# Patient Record
Sex: Male | Born: 1935 | Race: Black or African American | Hispanic: No | Marital: Married | Smoking: Former smoker
Health system: Southern US, Community
[De-identification: ages and names within clinical notes are randomized; demographics above are authoritative.]

## PROBLEM LIST (undated history)

## (undated) DIAGNOSIS — E785 Hyperlipidemia, unspecified: Secondary | ICD-10-CM

## (undated) DIAGNOSIS — I739 Peripheral vascular disease, unspecified: Secondary | ICD-10-CM

## (undated) DIAGNOSIS — R140 Abdominal distension (gaseous): Secondary | ICD-10-CM

## (undated) DIAGNOSIS — E1151 Type 2 diabetes mellitus with diabetic peripheral angiopathy without gangrene: Secondary | ICD-10-CM

## (undated) DIAGNOSIS — E119 Type 2 diabetes mellitus without complications: Secondary | ICD-10-CM

## (undated) DIAGNOSIS — I480 Paroxysmal atrial fibrillation: Secondary | ICD-10-CM

## (undated) DIAGNOSIS — K279 Peptic ulcer, site unspecified, unspecified as acute or chronic, without hemorrhage or perforation: Secondary | ICD-10-CM

## (undated) DIAGNOSIS — D649 Anemia, unspecified: Secondary | ICD-10-CM

## (undated) DIAGNOSIS — I10 Essential (primary) hypertension: Secondary | ICD-10-CM

## (undated) DIAGNOSIS — I5032 Chronic diastolic (congestive) heart failure: Secondary | ICD-10-CM

## (undated) HISTORY — PX: PR VEIN BYPASS GRAFT,AORTO-FEM-POP: 35551

## (undated) HISTORY — DX: Chronic diastolic (congestive) heart failure: I50.32

## (undated) HISTORY — DX: Essential (primary) hypertension: I10

## (undated) HISTORY — PX: APPENDECTOMY: SHX54

## (undated) HISTORY — DX: Type 2 diabetes mellitus with diabetic peripheral angiopathy without gangrene: E11.51

## (undated) HISTORY — DX: Anemia, unspecified: D64.9

## (undated) HISTORY — DX: Abdominal distension (gaseous): R14.0

## (undated) HISTORY — DX: Peripheral vascular disease, unspecified: I73.9

## (undated) HISTORY — DX: Hyperlipidemia, unspecified: E78.5

## (undated) HISTORY — PX: EYE SURGERY: SHX253

## (undated) HISTORY — PX: CATARACT EXTRACTION W/ INTRAOCULAR LENS  IMPLANT, BILATERAL: SHX1307

---

## 2008-08-04 ENCOUNTER — Emergency Department (HOSPITAL_COMMUNITY): Admission: EM | Admit: 2008-08-04 | Discharge: 2008-08-04 | Payer: Self-pay | Admitting: Emergency Medicine

## 2008-08-05 ENCOUNTER — Encounter (INDEPENDENT_AMBULATORY_CARE_PROVIDER_SITE_OTHER): Payer: Self-pay | Admitting: General Surgery

## 2008-08-05 ENCOUNTER — Inpatient Hospital Stay (HOSPITAL_COMMUNITY): Admission: EM | Admit: 2008-08-05 | Discharge: 2008-08-09 | Payer: Self-pay | Admitting: Emergency Medicine

## 2008-09-13 ENCOUNTER — Emergency Department (HOSPITAL_COMMUNITY): Admission: EM | Admit: 2008-09-13 | Discharge: 2008-09-14 | Payer: Self-pay | Admitting: Emergency Medicine

## 2008-11-05 ENCOUNTER — Emergency Department (HOSPITAL_COMMUNITY): Admission: EM | Admit: 2008-11-05 | Discharge: 2008-11-05 | Payer: Self-pay | Admitting: Family Medicine

## 2008-11-28 ENCOUNTER — Emergency Department (HOSPITAL_COMMUNITY): Admission: EM | Admit: 2008-11-28 | Discharge: 2008-11-28 | Payer: Self-pay | Admitting: Emergency Medicine

## 2008-12-26 ENCOUNTER — Ambulatory Visit: Payer: Self-pay | Admitting: Surgery

## 2008-12-29 ENCOUNTER — Observation Stay (HOSPITAL_COMMUNITY): Admission: RE | Admit: 2008-12-29 | Discharge: 2008-12-30 | Payer: Self-pay | Admitting: Surgery

## 2008-12-29 ENCOUNTER — Ambulatory Visit: Payer: Self-pay | Admitting: Surgery

## 2009-02-06 ENCOUNTER — Ambulatory Visit: Payer: Self-pay | Admitting: Surgery

## 2009-03-14 ENCOUNTER — Emergency Department (HOSPITAL_COMMUNITY): Admission: EM | Admit: 2009-03-14 | Discharge: 2009-03-14 | Payer: Self-pay | Admitting: Emergency Medicine

## 2009-04-27 ENCOUNTER — Ambulatory Visit: Payer: Self-pay | Admitting: Surgery

## 2009-05-27 DIAGNOSIS — I739 Peripheral vascular disease, unspecified: Secondary | ICD-10-CM

## 2009-05-27 HISTORY — DX: Peripheral vascular disease, unspecified: I73.9

## 2009-10-17 ENCOUNTER — Ambulatory Visit (HOSPITAL_COMMUNITY): Admission: RE | Admit: 2009-10-17 | Discharge: 2009-10-17 | Payer: Self-pay | Admitting: Pulmonary Disease

## 2009-12-04 ENCOUNTER — Ambulatory Visit: Payer: Self-pay | Admitting: Surgery

## 2009-12-06 ENCOUNTER — Ambulatory Visit: Payer: Self-pay | Admitting: Surgery

## 2009-12-06 ENCOUNTER — Ambulatory Visit (HOSPITAL_COMMUNITY): Admission: RE | Admit: 2009-12-06 | Discharge: 2009-12-06 | Payer: Self-pay | Admitting: Surgery

## 2009-12-18 ENCOUNTER — Ambulatory Visit: Payer: Self-pay | Admitting: Surgery

## 2009-12-29 ENCOUNTER — Inpatient Hospital Stay (HOSPITAL_COMMUNITY): Admission: RE | Admit: 2009-12-29 | Discharge: 2010-01-03 | Payer: Self-pay | Admitting: Surgery

## 2010-01-01 ENCOUNTER — Ambulatory Visit: Payer: Self-pay | Admitting: Vascular Surgery

## 2010-01-01 ENCOUNTER — Encounter: Payer: Self-pay | Admitting: Surgery

## 2010-08-10 LAB — CBC
HCT: 23.1 % — ABNORMAL LOW (ref 39.0–52.0)
HCT: 26 % — ABNORMAL LOW (ref 39.0–52.0)
HCT: 26 % — ABNORMAL LOW (ref 39.0–52.0)
HCT: 28.8 % — ABNORMAL LOW (ref 39.0–52.0)
HCT: 35.3 % — ABNORMAL LOW (ref 39.0–52.0)
Hemoglobin: 7.9 g/dL — ABNORMAL LOW (ref 13.0–17.0)
Hemoglobin: 8.7 g/dL — ABNORMAL LOW (ref 13.0–17.0)
MCH: 28.8 pg (ref 26.0–34.0)
MCH: 29.2 pg (ref 26.0–34.0)
MCH: 29.3 pg (ref 26.0–34.0)
MCHC: 32.7 g/dL (ref 30.0–36.0)
MCHC: 33.5 g/dL (ref 30.0–36.0)
MCV: 86.8 fL (ref 78.0–100.0)
MCV: 88.1 fL (ref 78.0–100.0)
MCV: 88.2 fL (ref 78.0–100.0)
Platelets: 185 10*3/uL (ref 150–400)
Platelets: 185 10*3/uL (ref 150–400)
Platelets: 197 10*3/uL (ref 150–400)
Platelets: 222 10*3/uL (ref 150–400)
RBC: 2.62 MIL/uL — ABNORMAL LOW (ref 4.22–5.81)
RBC: 2.7 MIL/uL — ABNORMAL LOW (ref 4.22–5.81)
RBC: 2.97 MIL/uL — ABNORMAL LOW (ref 4.22–5.81)
RBC: 3.42 MIL/uL — ABNORMAL LOW (ref 4.22–5.81)
RBC: 3.97 MIL/uL — ABNORMAL LOW (ref 4.22–5.81)
RDW: 13.5 % (ref 11.5–15.5)
RDW: 13.5 % (ref 11.5–15.5)
RDW: 13.6 % (ref 11.5–15.5)
RDW: 13.6 % (ref 11.5–15.5)
RDW: 13.7 % (ref 11.5–15.5)
WBC: 6.3 10*3/uL (ref 4.0–10.5)
WBC: 7.2 10*3/uL (ref 4.0–10.5)
WBC: 7.2 10*3/uL (ref 4.0–10.5)

## 2010-08-10 LAB — PROTIME-INR: INR: 1.06 (ref 0.00–1.49)

## 2010-08-10 LAB — URINALYSIS, ROUTINE W REFLEX MICROSCOPIC
Bilirubin Urine: NEGATIVE
Glucose, UA: NEGATIVE mg/dL
Leukocytes, UA: NEGATIVE
Nitrite: NEGATIVE
Protein, ur: 100 mg/dL — AB
Specific Gravity, Urine: 1.02 (ref 1.005–1.030)
Urobilinogen, UA: 1 mg/dL (ref 0.0–1.0)

## 2010-08-10 LAB — BASIC METABOLIC PANEL
BUN: 11 mg/dL (ref 6–23)
BUN: 14 mg/dL (ref 6–23)
BUN: 15 mg/dL (ref 6–23)
CO2: 25 mEq/L (ref 19–32)
Calcium: 8.8 mg/dL (ref 8.4–10.5)
Chloride: 104 mEq/L (ref 96–112)
Chloride: 106 mEq/L (ref 96–112)
Creatinine, Ser: 0.96 mg/dL (ref 0.4–1.5)
Creatinine, Ser: 1.08 mg/dL (ref 0.4–1.5)
GFR calc non Af Amer: >60 mL/min (ref >60)
Glucose, Bld: 146 mg/dL — ABNORMAL HIGH (ref 70–99)
Glucose, Bld: 155 mg/dL — ABNORMAL HIGH (ref 70–99)
Potassium: 4.4 mEq/L (ref 3.5–5.1)
Sodium: 137 mEq/L (ref 135–145)

## 2010-08-10 LAB — GLUCOSE, CAPILLARY
Glucose-Capillary: 110 mg/dL — ABNORMAL HIGH (ref 70–99)
Glucose-Capillary: 111 mg/dL — ABNORMAL HIGH (ref 70–99)
Glucose-Capillary: 116 mg/dL — ABNORMAL HIGH (ref 70–99)
Glucose-Capillary: 116 mg/dL — ABNORMAL HIGH (ref 70–99)
Glucose-Capillary: 119 mg/dL — ABNORMAL HIGH (ref 70–99)
Glucose-Capillary: 119 mg/dL — ABNORMAL HIGH (ref 70–99)
Glucose-Capillary: 125 mg/dL — ABNORMAL HIGH (ref 70–99)
Glucose-Capillary: 144 mg/dL — ABNORMAL HIGH (ref 70–99)
Glucose-Capillary: 151 mg/dL — ABNORMAL HIGH (ref 70–99)
Glucose-Capillary: 154 mg/dL — ABNORMAL HIGH (ref 70–99)
Glucose-Capillary: 155 mg/dL — ABNORMAL HIGH (ref 70–99)
Glucose-Capillary: 163 mg/dL — ABNORMAL HIGH (ref 70–99)
Glucose-Capillary: 164 mg/dL — ABNORMAL HIGH (ref 70–99)
Glucose-Capillary: 173 mg/dL — ABNORMAL HIGH (ref 70–99)
Glucose-Capillary: 195 mg/dL — ABNORMAL HIGH (ref 70–99)
Glucose-Capillary: 227 mg/dL — ABNORMAL HIGH (ref 70–99)

## 2010-08-10 LAB — CROSSMATCH

## 2010-08-10 LAB — URINE MICROSCOPIC-ADD ON

## 2010-08-10 LAB — POCT I-STAT 4, (NA,K, GLUC, HGB,HCT)
Glucose, Bld: 112 mg/dL — ABNORMAL HIGH (ref 70–99)
HCT: 27 % — ABNORMAL LOW (ref 39.0–52.0)
Hemoglobin: 9.2 g/dL — ABNORMAL LOW (ref 13.0–17.0)
Potassium: 4.4 mEq/L (ref 3.5–5.1)
Sodium: 137 mEq/L (ref 135–145)
Sodium: 137 mEq/L (ref 135–145)

## 2010-08-10 LAB — HEPARIN LEVEL (UNFRACTIONATED)
Heparin Unfractionated: <0.1 IU/mL — ABNORMAL LOW (ref 0.30–0.70)
Heparin Unfractionated: <0.1 IU/mL — ABNORMAL LOW (ref 0.30–0.70)
Heparin Unfractionated: <0.1 IU/mL — ABNORMAL LOW (ref 0.30–0.70)

## 2010-08-10 LAB — COMPREHENSIVE METABOLIC PANEL
AST: 16 U/L (ref 0–37)
BUN: 18 mg/dL (ref 6–23)
Calcium: 9.7 mg/dL (ref 8.4–10.5)
Creatinine, Ser: 1.1 mg/dL (ref 0.4–1.5)
Potassium: 4.5 mEq/L (ref 3.5–5.1)
Sodium: 137 mEq/L (ref 135–145)

## 2010-08-10 LAB — SURGICAL PCR SCREEN: MRSA, PCR: NEGATIVE

## 2010-08-10 LAB — APTT: aPTT: 31 seconds (ref 24–37)

## 2010-08-10 LAB — HEMOGLOBIN A1C: Mean Plasma Glucose: 148 mg/dL — ABNORMAL HIGH (ref <117)

## 2010-08-10 LAB — TYPE AND SCREEN: Antibody Screen: NEGATIVE

## 2010-08-12 LAB — POCT I-STAT, CHEM 8
Calcium, Ion: 1.13 mmol/L (ref 1.12–1.32)
Creatinine, Ser: 1.4 mg/dL (ref 0.4–1.5)
Glucose, Bld: 110 mg/dL — ABNORMAL HIGH (ref 70–99)
Hemoglobin: 13.3 g/dL (ref 13.0–17.0)
Potassium: 4.6 mEq/L (ref 3.5–5.1)

## 2010-09-01 LAB — BASIC METABOLIC PANEL
Chloride: 104 mEq/L (ref 96–112)
GFR calc non Af Amer: >60 mL/min (ref >60)
Glucose, Bld: 104 mg/dL — ABNORMAL HIGH (ref 70–99)
Potassium: 3.5 mEq/L (ref 3.5–5.1)
Sodium: 137 mEq/L (ref 135–145)

## 2010-09-01 LAB — CBC
HCT: 29.8 % — ABNORMAL LOW (ref 39.0–52.0)
Hemoglobin: 10.2 g/dL — ABNORMAL LOW (ref 13.0–17.0)
WBC: 5.4 10*3/uL (ref 4.0–10.5)

## 2010-09-01 LAB — POCT I-STAT, CHEM 8
Calcium, Ion: 1.14 mmol/L (ref 1.12–1.32)
Creatinine, Ser: 1 mg/dL (ref 0.4–1.5)
Glucose, Bld: 140 mg/dL — ABNORMAL HIGH (ref 70–99)
Hemoglobin: 12.9 g/dL — ABNORMAL LOW (ref 13.0–17.0)
Potassium: 3.9 mEq/L (ref 3.5–5.1)

## 2010-09-01 LAB — GLUCOSE, CAPILLARY: Glucose-Capillary: 95 mg/dL (ref 70–99)

## 2010-09-02 LAB — BASIC METABOLIC PANEL
Calcium: 9.3 mg/dL (ref 8.4–10.5)
GFR calc Af Amer: >60 mL/min (ref >60)
GFR calc non Af Amer: >60 mL/min (ref >60)
Glucose, Bld: 119 mg/dL — ABNORMAL HIGH (ref 70–99)
Sodium: 135 mEq/L (ref 135–145)

## 2010-09-02 LAB — URINALYSIS, ROUTINE W REFLEX MICROSCOPIC
Glucose, UA: NEGATIVE mg/dL
Specific Gravity, Urine: >1.03 — ABNORMAL HIGH (ref 1.005–1.030)

## 2010-09-02 LAB — DIFFERENTIAL
Basophils Absolute: 0 10*3/uL (ref 0.0–0.1)
Lymphocytes Relative: 25 % (ref 12–46)
Monocytes Absolute: 0.2 10*3/uL (ref 0.1–1.0)
Neutro Abs: 3.7 10*3/uL (ref 1.7–7.7)

## 2010-09-02 LAB — CBC
Hemoglobin: 12.2 g/dL — ABNORMAL LOW (ref 13.0–17.0)
RBC: 4.01 MIL/uL — ABNORMAL LOW (ref 4.22–5.81)
RDW: 13.6 % (ref 11.5–15.5)

## 2010-09-02 LAB — URINE MICROSCOPIC-ADD ON

## 2010-09-05 LAB — URINALYSIS, ROUTINE W REFLEX MICROSCOPIC
Bilirubin Urine: NEGATIVE
Leukocytes, UA: NEGATIVE
Nitrite: NEGATIVE
Specific Gravity, Urine: >1.03 — ABNORMAL HIGH (ref 1.005–1.030)
pH: 5.5 (ref 5.0–8.0)

## 2010-09-05 LAB — URINE MICROSCOPIC-ADD ON

## 2010-09-06 LAB — URINE MICROSCOPIC-ADD ON

## 2010-09-06 LAB — MAGNESIUM
Magnesium: 2 mg/dL (ref 1.5–2.5)
Magnesium: 1.8 mg/dL (ref 1.5–2.5)

## 2010-09-06 LAB — COMPREHENSIVE METABOLIC PANEL
ALT: 12 U/L (ref 0–53)
ALT: 12 U/L (ref 0–53)
AST: 15 U/L (ref 0–37)
AST: 14 U/L (ref 0–37)
AST: 14 U/L (ref 0–37)
Albumin: 2.2 g/dL — ABNORMAL LOW (ref 3.5–5.2)
Albumin: 2.4 g/dL — ABNORMAL LOW (ref 3.5–5.2)
Alkaline Phosphatase: 74 U/L (ref 39–117)
Alkaline Phosphatase: 74 U/L (ref 39–117)
Alkaline Phosphatase: 75 U/L (ref 39–117)
BUN: 20 mg/dL (ref 6–23)
BUN: 16 mg/dL (ref 6–23)
BUN: 22 mg/dL (ref 6–23)
CO2: 26 mEq/L (ref 19–32)
CO2: 29 mEq/L (ref 19–32)
Calcium: 8.6 mg/dL (ref 8.4–10.5)
Calcium: 9.3 mg/dL (ref 8.4–10.5)
Chloride: 101 mEq/L (ref 96–112)
Creatinine, Ser: 0.92 mg/dL (ref 0.4–1.5)
Creatinine, Ser: 0.99 mg/dL (ref 0.4–1.5)
GFR calc Af Amer: >60 mL/min (ref >60)
GFR calc Af Amer: >60 mL/min (ref >60)
GFR calc Af Amer: >60 mL/min (ref >60)
GFR calc Af Amer: >60 mL/min (ref >60)
GFR calc non Af Amer: >60 mL/min (ref >60)
GFR calc non Af Amer: >60 mL/min (ref >60)
Glucose, Bld: 276 mg/dL — ABNORMAL HIGH (ref 70–99)
Glucose, Bld: 291 mg/dL — ABNORMAL HIGH (ref 70–99)
Potassium: 3.5 mEq/L (ref 3.5–5.1)
Potassium: 3.5 mEq/L (ref 3.5–5.1)
Potassium: 4.1 mEq/L (ref 3.5–5.1)
Sodium: 136 mEq/L (ref 135–145)
Sodium: 136 mEq/L (ref 135–145)
Total Bilirubin: 1.6 mg/dL — ABNORMAL HIGH (ref 0.3–1.2)
Total Protein: 4.9 g/dL — ABNORMAL LOW (ref 6.0–8.3)
Total Protein: 5.4 g/dL — ABNORMAL LOW (ref 6.0–8.3)
Total Protein: 5.9 g/dL — ABNORMAL LOW (ref 6.0–8.3)

## 2010-09-06 LAB — CROSSMATCH

## 2010-09-06 LAB — DIFFERENTIAL
Basophils Absolute: 0 10*3/uL (ref 0.0–0.1)
Basophils Absolute: 0 10*3/uL (ref 0.0–0.1)
Basophils Relative: 0 % (ref 0–1)
Eosinophils Absolute: 0.3 10*3/uL (ref 0.0–0.7)
Eosinophils Absolute: 0 10*3/uL (ref 0.0–0.7)
Eosinophils Absolute: 0.2 10*3/uL (ref 0.0–0.7)
Eosinophils Relative: 4 % (ref 0–5)
Eosinophils Relative: 0 % (ref 0–5)
Eosinophils Relative: 0 % (ref 0–5)
Lymphocytes Relative: 13 % (ref 12–46)
Lymphocytes Relative: 6 % — ABNORMAL LOW (ref 12–46)
Lymphocytes Relative: 8 % — ABNORMAL LOW (ref 12–46)
Lymphs Abs: 0.8 10*3/uL (ref 0.7–4.0)
Lymphs Abs: 0.9 10*3/uL (ref 0.7–4.0)
Monocytes Absolute: 0.7 10*3/uL (ref 0.1–1.0)
Monocytes Relative: 3 % (ref 3–12)
Monocytes Relative: 8 % (ref 3–12)
Monocytes Relative: 8 % (ref 3–12)
Neutro Abs: 5.6 10*3/uL (ref 1.7–7.7)
Neutro Abs: 7.1 10*3/uL (ref 1.7–7.7)
Neutro Abs: 8.1 10*3/uL — ABNORMAL HIGH (ref 1.7–7.7)
Neutrophils Relative %: 78 % — ABNORMAL HIGH (ref 43–77)
Neutrophils Relative %: 88 % — ABNORMAL HIGH (ref 43–77)
Neutrophils Relative %: 90 % — ABNORMAL HIGH (ref 43–77)

## 2010-09-06 LAB — GLUCOSE, CAPILLARY
Glucose-Capillary: 261 mg/dL — ABNORMAL HIGH (ref 70–99)
Glucose-Capillary: 231 mg/dL — ABNORMAL HIGH (ref 70–99)
Glucose-Capillary: 244 mg/dL — ABNORMAL HIGH (ref 70–99)
Glucose-Capillary: 250 mg/dL — ABNORMAL HIGH (ref 70–99)
Glucose-Capillary: 268 mg/dL — ABNORMAL HIGH (ref 70–99)
Glucose-Capillary: 274 mg/dL — ABNORMAL HIGH (ref 70–99)
Glucose-Capillary: 278 mg/dL — ABNORMAL HIGH (ref 70–99)
Glucose-Capillary: 295 mg/dL — ABNORMAL HIGH (ref 70–99)
Glucose-Capillary: 298 mg/dL — ABNORMAL HIGH (ref 70–99)

## 2010-09-06 LAB — URINALYSIS, ROUTINE W REFLEX MICROSCOPIC
Glucose, UA: >1000 mg/dL — AB
Leukocytes, UA: NEGATIVE
Specific Gravity, Urine: 1.03 (ref 1.005–1.030)
pH: 5.5 (ref 5.0–8.0)

## 2010-09-06 LAB — CBC
HCT: 34.6 % — ABNORMAL LOW (ref 39.0–52.0)
HCT: 36.1 % — ABNORMAL LOW (ref 39.0–52.0)
HCT: 39.5 % (ref 39.0–52.0)
Hemoglobin: 13.3 g/dL (ref 13.0–17.0)
MCHC: 33.2 g/dL (ref 30.0–36.0)
MCHC: 34 g/dL (ref 30.0–36.0)
MCHC: 34.6 g/dL (ref 30.0–36.0)
MCV: 91.4 fL (ref 78.0–100.0)
Platelets: 203 10*3/uL (ref 150–400)
Platelets: 199 10*3/uL (ref 150–400)
RBC: 4.32 MIL/uL (ref 4.22–5.81)
RBC: 4.39 MIL/uL (ref 4.22–5.81)
RDW: 13.2 % (ref 11.5–15.5)
RDW: 13.7 % (ref 11.5–15.5)
RDW: 13.8 % (ref 11.5–15.5)
WBC: 10.8 10*3/uL — ABNORMAL HIGH (ref 4.0–10.5)
WBC: 9.2 10*3/uL (ref 4.0–10.5)

## 2010-09-06 LAB — PHOSPHORUS
Phosphorus: 2.5 mg/dL (ref 2.3–4.6)
Phosphorus: 1.6 mg/dL — ABNORMAL LOW (ref 2.3–4.6)
Phosphorus: 3 mg/dL (ref 2.3–4.6)

## 2010-09-06 LAB — LIPASE, BLOOD: Lipase: 14 U/L (ref 11–59)

## 2010-09-06 LAB — HEMOGLOBIN A1C: Mean Plasma Glucose: 275 mg/dL

## 2010-10-09 NOTE — Assessment & Plan Note (Signed)
OFFICE VISIT   Warren Mcdonald, Warren Mcdonald  DOB:  [birth year]                                       12/18/2009  [id number]#:[id number]   REASON FOR VISIT:  Follow up ulcer.   HISTORY:  This is a 77 year old gentleman with approximately a 6-week  history of ulcer on his left leg.  Previous ABIs were 0.5.  He underwent  arteriogram which showed severe tibial disease, not amendable to  percutaneous intervention.  He comes in today to discuss operative  intervention.  In the interim he has seen Dr. Allyson Sabal, who has ordered a  stress test for this week.   The patient is diabetic.  His blood sugars have been in his normal  range, which has been around 120.  He is medically managed for his  hypertension.   REVIEW OF SYSTEMS:  Reviewed and no significant change from previous  visit on December 04, 2009,   PHYSICAL EXAMINATION:  Heart rate 91, blood pressure 181/77, temperature  is 97.9.  generally, he is well-appearing, in no distress.  HEENT:  Within normal limits.  Lungs are clear bilaterally.  Cardiovascular is a  regular rate and rhythm.  Abdomen is soft, nontender.  Neuro:  He is  without focal deficits.  Skin is without rash.  Left leg shows a 3 x 2  ulcer on the heel.   DIAGNOSTICS:  I have ordered and independently reviewed vein mapping,  which shows him to have a moderate-sized vein on the left.   PLAN:  Left heel ulcer.  The patient will be scheduled for a left  femoral to posterior tibial bypass graft with ipsilateral vein.  This  has been scheduled for Friday, August 5.  I discussed the risks and  benefits of the operation including the risk of infection, the risk of  wound healing problems, the risk of failure.  Patient is unaware of his  medications. I have told him to make sure that he is off of his Plavix.  Again, we will proceed on Friday, August 5, pending cardiac clearance  from Dr. Allyson Sabal.     Jorge Ny, MD  Electronically Signed   VWB/MEDQ  D:   12/18/2009  T:  12/19/2009  Job:  [id number]   cc:   Nanetta Batty, M.D.  Edward L. Juanetta Gosling, M.D.

## 2010-10-09 NOTE — H&P (Signed)
NAME:  Warren Mcdonald, Warren Mcdonald NO.:  [id number]   MEDICAL RECORD NO.:  [id number]         PATIENT TYPE:  PINP   LOCATION:  IC04                          FACILITY:  [facility]   PHYSICIAN:  Tilford Pillar, MD      DATE OF BIRTH:  1930-05-04   DATE OF ADMISSION:  DATE OF DISCHARGE:  LH                              HISTORY & PHYSICAL   CHIEF COMPLAINT:  Abdominal pain.   HISTORY OF PRESENT ILLNESS:  The patient is a 77 year old male with no  known significant medical problems, who presented to [facility]  Emergency Department with approximately 4 days of increasing abdominal  pain.  This has been relatively constant in the periumbilical and right  lower quadrant since Tuesday.  He has been described as sharp and  constant.  It does increase in severity with movement and palpation.  He  has no known relieving factors.  He denies any fever or chills.  He has  had anorexia, but has tried to take oral intake.  Last meal, the patient  has had yesterday morning.  He has had positive nausea and vomiting.  He  has had no hematemesis.  He has had no change in bowel movements,  although the patient does state that he has had no bowel movements over  the last 3 days.  He has had no prior history of melena or hematochezia.  He has had no prior colonoscopy history.  No history of the family colon  cancers, gastrointestinal problems, Crohn disease, and ulcerative  colitis.  There is a family history of prostate cancer.   PAST MEDICAL HISTORY:  None.  Apparently, the patient rarely sees any  physicians and while the patient has no record of any medical problems.  Family states that this may be lack of evaluation.   PAST SURGICAL HISTORY:  Tonsils and adenoidectomy.   MEDICATIONS:  Flonase and Tylenol.   ALLERGIES:  No known drug allergies.   SOCIAL HISTORY:  No tobacco.  No alcohol.  No recreational drug use.   REVIEW OF SYSTEMS:  CONSTITUTIONAL:  Unremarkable.  EYES:  Unremarkable.  EARS, NOSE AND THROAT:  Unremarkable.  PULMONARY:  Unremarkable.  CARDIOVASCULAR:  Unremarkable.  GASTROINTESTINAL:  As per HPI, otherwise  unremarkable.  GENITOURINARY:  No complaints of dysuria or hematuria.  No difficulty with urination.  SKIN:  Unremarkable.  NEUROLOGIC:  Unremarkable.  ENDOCRINE:  Unremarkable.  MUSCULOSKELETAL:  Unremarkable.   PHYSICAL EXAMINATION:  VITAL SIGNS:  Temperature 97.9, heart rate 86,  respirations 18, and blood pressure 140/82.  He has 96% O2 saturation on  room air.  GENERAL:  The patient is awake.  He is lying in supine  position in the emergency room cart.  He is alert and oriented x3.  He  is not in any acute distress.  Family is present at the time of  evaluation and discussion.  HEENT:  Scalp, no deformities.  Pupils are equal, round, and reactive.  Extraocular movements are intact.  No conjunctival pallor is noted.  Oral mucosa is pink and normal occlusion.  NECK:  Trachea is midline.  PULMONARY:  Unlabored respirations.  He is clear to auscultation.  No  crackles are apparent.  No wheezes are auscultated. CARDIOVASCULAR:  Regular rate and rhythm.  No murmurs are apparent.  He has 2+ radial and  dorsalis pedis pulses bilaterally.  ABDOMEN:  No bowel sounds are  apparent.  He is distended.  He does have right lower quadrant abdominal  pain.  This pain is generalized around the right lower quadrant, but  does have somewhat increased pain from McBurney point.  There is no  erythema over this area.  No crepitance.  He has no referred pain.  No  Rovsing sign.  He has no hernias and no masses are apparent.  No diffuse  peritoneal signs.  EXTREMITIES:  Warm and dry.   PERTINENT LABORATORY AND RADIOGRAPHIC STUDIES:  CBC; white blood cell  count 10.8, hemoglobin 13.3, hematocrit 39.3, and platelets 196.  Neutrophils elevated at 90, lymphocytes 7, and monocytes 3.  Basic  metabolic panel; sodium 136, potassium 4.1, chloride 99, bicarb 29, BUN  22,  creatinine 0.92, and blood glucose is 291.  Albumin is normal at  3.5.  CT of the abdomen and pelvis demonstrates some localized  collection of free air and free fluid around the pericecal and  periappendiceal region.  There is distention of the appendix and there  is fluid and inflammatory changes in this area consistent with  inflammatory phlegmon.  There is no diffuse peritoneal fluid or free air  and no additional masses or changes are noted.   ASSESSMENT AND PLAN:  Ruptured appendicitis.  At this time, I did  discuss with the patient and the family, the need for emergent  exploration and appendectomy.  I also discussed the possibility of colon  resection as well as the possibility of a ostomy colostomy pending  intraoperative findings.  At this time based on the patient's history  and symptomatology, it would be suspicious of an acute sporulative  initial appendicitis with resulting rupture, versus an occult neoplastic  or malignant etiology of this perforation.  However, based on the  patient's limited prior workup, this is the possibility I was discussed  with the family.  At that time of his exploration, this will be  evaluated and further with exploration.  At this time, the patient will  be continued on IV fluid.  He will be given Mefoxin as antibiotic  coverage.  We will kept n.p.o. and will be scheduled for an emergent  exploratory laparotomy, appendectomy, possible colectomy and possible  ostomy.  Risks, benefits, and alternatives were discussed with the  patient and family including, but not limited to risk of bleeding,  infection, abscess, appendiceal stump leak, as well as possibility of  anastomotic leak, or bowel injury as well as intraoperative cardiac and  pulmonary events.  The patient's questions and concerns were addressed  and we will proceed with an emergent exploration of the abdomen.      Tilford Pillar, MD  Electronically Signed     BZ/MEDQ  D:   08/05/2008  T:  08/06/2008  Job:  [phone] [id number]

## 2010-10-09 NOTE — Procedures (Signed)
LOWER EXTREMITY ARTERIAL DUPLEX   INDICATION:  Followup PTA of right posterior tibial arteries and  tibioperoneal trunk.   HISTORY:  Diabetes:  Yes  Cardiac:  No  Hypertension:  Yes.  Smoking:  Previous  Previous Surgery:  Right posterior tibial artery and tibioperoneal  trunk.  PTA on 12/29/2008.   SINGLE LEVEL ARTERIAL EXAM                          RIGHT                LEFT  Brachial:               216                  204  Anterior tibial:        157                  110  Posterior tibial:       214                  106  Peroneal:  Ankle/Brachial Index:   0.99                 0.51   LOWER EXTREMITY ARTERIAL DUPLEX EXAM   DUPLEX:  Patent right leg arterial duplex with biphasic waveforms at the  common femoral artery level to the popliteal level.  Distal to the  popliteal level, the waveforms become monophasic with elevated  velocities of 289 cm mid posterior tibial artery level.   IMPRESSION:  1. The right ankle-brachial index appears stable.  2. The left ankle-brachial index appears to have dropped from previous      studies; however, it still suggests moderate disease.  3. Patent right leg arterial duplex with elevated velocities of 289 cm      at the mid posterior tibial artery level.         ___________________________________________  V. Charlena Cross, MD   CB/MEDQ  D:  04/27/2009  T:  04/27/2009  Job:  [id number]

## 2010-10-09 NOTE — Assessment & Plan Note (Signed)
OFFICE VISIT   ROSTON, GRUNEWALD  DOB:  03-Oct-1929                                       12/04/2009  [id number]#:[id number]   REASON FOR VISIT:  Left heel ulcer.   HISTORY:  This is a 77 year old gentleman well-known to me status post  percutaneous intervention for a right leg ulcer in August 2010.  He had  recannulization of an occluded right posterior tibial artery.  His right  leg has done well since that time.  The patient comes in today with a 1-  month history of a new ulcer on his left leg.  Outside ankle brachial  indices are significantly changed from a year ago.  His ABI is 0.55.  Previously they had been 0.7 in January.  However, at our office, his  ABI was 0.5.  The wound has been done locally debrided and he is  undergoing 3 times a week dressing changes.   The patient is a diabetic.  His blood sugars in the morning have been  ranging less than 120.  He is also medically managed for his  hypertension which has been stable.   REVIEW OF SYSTEMS:  GENERAL:  Negative for fevers, chills, weight gain,  weight loss.  VASCULAR:  Positive pain in legs when walking.  CARDIAC:  Negative.  GI:  Negative.  All other review systems are negative as documented in the encounter  form.   PHYSICAL EXAMINATION:  Heart rate 93, blood pressure 183/84, O2 sats  96%.  Temperature is 97.8.  GENERAL:  He is well-appearing, in no distress.  HEENT:  Within normal limits.  LUNGS:  Clear bilaterally.  CARDIOVASCULAR:  Regular rate and rhythm.  ABDOMEN:  Soft, nontender.  MUSCULOSKELETAL:  He has no edema.  No cyanosis.  There is a 3 x 2 ulcer  on the left heel.  Bone is not exposed.  There is no surrounding  erythema.  NEURO:  He has no focal deficits.  SKIN:  Without rash.   ASSESSMENT/PLAN:  New left heel ulcer.   PLAN:  At this point, the patient is be scheduled for arteriogram.  His  ulcer is deep and does not appear to involve the bone at this time but I  do  think he will need to revascularization in order to heal this.  I  have scheduled his arteriogram for this Wednesday July 13.  I will plan  on doing an aortogram studying both legs, with intervention planned for  the left leg if possible.  This will be from a right groin approach.  Risks and benefits were discussed with the patient.     Jorge Ny, MD  Electronically Signed   VWB/MEDQ  D:  12/04/2009  T:  12/05/2009  Job:  [id number]   cc:   Ramon Dredge L. Juanetta Gosling, M.D.

## 2010-10-09 NOTE — Op Note (Signed)
NAME:  Warren Mcdonald, Warren Mcdonald              ACCOUNT NO.:  [id number]   MEDICAL RECORD NO.:  [id number]          PATIENT TYPE:  OBV   LOCATION:  3735                         FACILITY:  [facility]   PHYSICIAN:  Juleen China IV, MDDATE OF BIRTH:  Sep 20, 1929   DATE OF PROCEDURE:  12/29/2008  DATE OF DISCHARGE:                               OPERATIVE REPORT   PREOPERATIVE DIAGNOSIS:  Right leg ulcer.   POSTOPERATIVE DIAGNOSIS:  Right leg ulcer.   PROCEDURE PERFORMED:  1. Ultrasound access left femoral artery.  2. Abdominal aortogram.  3. Right lower extremity runoff.  4. Third order catheterization.  5. Additional order catheterization.  6. Percutaneous transluminal angioplasty right posterior tibial      artery.  7. Percutaneous transluminal angioplasty right tibioperoneal trunk.  8. Intraarterial administration of nitroglycerin.   INDICATIONS:  This is a 77 year old gentleman with several-month history  of a right heel ulcer.  Noninvasive imaging revealed significant lower  extremity vascular disease.  He comes today for arteriogram and possible  intervention.   PROCEDURE:  The patient was identified in the holding area and taken to  room A where he was placed supine on the table.  Bilateral groins were  prepped and draped in standard sterile fashion.  Time-out was called.  The left femoral artery was evaluated with ultrasound and found to be  widely patent.  Lidocaine 1% was used for local anesthesia.  The left  femoral artery was accessed under ultrasound guidance.  With an 18-gauge  needle an 0.0 35 wire was advanced into the aorta and under fluoroscopic  visualization a 5-French sheath was placed.  Over the wire, an Omniflush  catheter was advanced to the level of L1 and abdominal aortogram was  obtained.  Next using the Omniflush catheter and Bentson wire the  bifurcation was crossed.  Catheter was placed in the right external  iliac artery and right lower extremity runoff was  obtained.   FINDINGS:  Aortogram:  The visualized portions of the suprarenal  abdominal aorta showed minimal disease.  Right renal artery is  visualized and it is widely patent.  The left renal artery is not  visualized.  The infrarenal abdominal aorta without significant  stenosis.  The left common iliac artery is widely patent.  The left  external iliac artery is widely patent.  The left hypogastric artery is  widely patent.  The right common iliac artery is widely patent.  The  right external iliac artery is widely patent.  The right hypogastric  artery is widely patent.   Right lower extremity:  The right common femoral artery is widely patent  throughout its course.  The right profunda femoral artery is widely  patent.  The right superficial femoral artery has mild disease in the  adductor canal, but is widely patent without significant stenosis.  The  popliteal artery has mild disease above the knee and mild disease below  the knee. The anterior tibial artery is occluded.  The peroneal artery  is occluded.  Tibioperoneal trunk is patent.  The posterior tibial is  intermittently patent down across the ankle.  The plantar artery reforms  from collaterals.   After the above images were obtained, decision was made to intervene.  Over a Rosen wire, a 6-French 9-cm sheath was advanced into the  popliteal artery.  Next, the patient was given systemic heparinization.  Using a Quick-Cross 0.018 catheter and a cougar wire wire access was  obtained into the posterior tibial artery.  Subintimal recanalization  was performed of the posterior tibial artery.  The wire was advanced  down into the posterior tibial artery past the ankle.  A 3 x 120 Fox SV  balloon was advanced down into the posterior tibial artery at the ankle.  Balloon angioplasty was performed with multiple inflations of the  posterior tibial and tibioperoneal trunk taking the balloon to 8  atmospheres and holding it up for 2  minutes.  A followup arteriogram was  then performed which revealed suboptimal results.  There was continued  disease in the posterior tibial artery down by the ankle.  I then re-  inserted the 3-mm balloon down to the ankle.  Balloon angioplasty was  then performed taking the balloon to 8 atmospheres and holding up for 2  minutes.  A followup study was then performed.  This revealed continued  stenosis down by the ankle and an area that had been untreated.  I felt  this needed to be addressed and therefore, another angioplasty was  performed on this and taking the balloon to 5 atmospheres to profile.  Intraarterial nitroglycerin was given.  At this time the posterior  tibial artery was without stenosis down across the ankle.  However, on  images of the foot, there appeared to be occlusion of the plantar artery  which was filling from collaterals before.  I tried to get across this  with the wire, however, was unsuccessful.  This was also outside the  limits of the balloon.  The balloon was hobbed but would not get down to  this area.  The collateral filling of the ankle remained patent.  There  was late filling of the plantar artery from collaterals.  At this point,  I did not feel there was any further intervention that could be  performed.  I think the patient does have outflow to keep posterior  tibial artery intervention open, therefore, I elected to stop the  procedure at this time.  The long sheath was removed and exchanged out  for a short 6 sheath.  The patient taken to the holding area for sheath  pull once his coagulation profile corrects.   IMPRESSION:  Significant tibial disease with single-vessel runoff via a  occluded posterior tibial artery.  This was opened and with subintimal  recanalization balloon angioplasty was performed.  The patient now has a  widely patent posterior tibial artery down across the ankle.  There was  dissection and occlusion of the proximal plantar  artery which could not  be reopened.      Jorge Ny, MD  Electronically Signed     VWB/MEDQ  D:  12/29/2008  T:  12/29/2008  Job:  [id number]

## 2010-10-09 NOTE — Assessment & Plan Note (Signed)
OFFICE VISIT   Warren Mcdonald, Warren Mcdonald  DOB:  February 05, 1930                                       02/06/2009  [id number]#:[id number]   REASON FOR VISIT:  Follow up stent.   HISTORY:  This is a 77 year old gentleman that I initially saw at the  request Dr. Tanda Rockers for evaluation of a right heel ulcer that had been  present for approximately 2 months.  He had an outside ultrasound which  revealed an ankle brachial index of 0.59 on the right and 0.92 on the  left.  He underwent arteriogram on December 29, 2008.  At that time the  patient was found have significant tibial disease with single vessel  runoff which was posterior tibial artery which was occluded proximally.  This was recanalized and balloon angioplasty was performed.  The patient  at the end of the procedure had an in-line flow down across his ankle  via the posterior tibial artery.  The patient has been doing very well.  According to him the wound has been getting smaller, he has been  undergoing hyperbaric therapy.  The wound is dressed and is changed  every Wednesday at the wound center.   From a vascular perspective, I am very pleased with the progress the  patient has made, his ankle brachial index has increased to 0.93 in our  office today; I will defer to Dr. Tanda Rockers for wound care management.  The patient will be placed on our bypass surveillance protocol, which  will be ultrasounds every 3 months.  He will come back to see [place] at his  next ultrasound scan unless there is a change in his wound.   Jorge Ny, MD  Electronically Signed   VWB/MEDQ  D:  02/06/2009  T:  02/07/2009  Job:  [id number]

## 2010-10-09 NOTE — Discharge Summary (Signed)
NAME:  Warren Mcdonald, Warren Mcdonald NO.:  [id number]   MEDICAL RECORD NO.:  [id number]          PATIENT TYPE:  INP   LOCATION:  A325                          FACILITY:  [facility]   PHYSICIAN:  Tilford Pillar, MD      DATE OF BIRTH:  [birth year]   DATE OF ADMISSION:  08/05/2008  DATE OF DISCHARGE:  03/16/2010LH                               DISCHARGE SUMMARY   ADMISSION DIAGNOSIS:  Perforated gangrenous appendicitis.   POSTOPERATIVE DIAGNOSES:  Status post open appendectomy for gangrenous  appendicitis, newly diagnosed hypertension, newly diagnosed diabetes  mellitus.   PROCEDURES:  Open appendectomy exploratory laparotomy.   ADMITTING SURGEON:  Dr. Tilford Pillar   DISPOSITION:  Home.   BRIEF HISTORY AND PHYSICAL:  Please see the admission history and  physical for complete H and P.  The patient is a 77 year old male  presented to [facility] emergency department with acute onset of right  lower quadrant abdominal pain.  This had actually had preceded his  arrival to the emergency department by 4 days.  On evaluation, in the  emergency department, CT evaluation was obtained, which demonstrated  free fluid and air around the appendix consistent with perforation.  The  patient was admitted for planned operative intervention and continue  management.   HOSPITAL COURSE:  The patient was admitted on March 12 and he was taken  subsequently to the operating room for an emergent open appendectomy.  He did tolerate the procedure well.  There has been a 24-hour period in  the intensive care unit for initial monitoring then he was transferred  back to the surgical floor.  At this time, he was continued on close  monitoring, demonstrated symptoms or clinical signs of hypertension with  continued elevated both systolic and diastolic blood pressures as well  as continued elevated blood glucose levels.  At this point, consultation  or outpatient referral will be obtained by Dr. Juanetta Gosling who has  been  contacted and who has been briefed in the patient's current medical  condition.  The patient had no prior primary care physician over the  last several decades.  On August 09, 2008, the patient was tolerating a  regular diet, had normal bowel function.  His JP drain was discontinued  and his hypertension was controlled on oral medications.  Plans were  made for discharge, close medical followup, and surgical followup.  He  was discharged on August 09, 2008.   DISCHARGE INSTRUCTIONS:  The patient was instructed to continue a  diabetic diet, low-sugar diet.  He was instructed on wound care.  He was  instructed to continue activity as tolerated, avoid lifting anything  greater than 20 pounds for the next 4 weeks.  He is not to drive while  taking pain medications.   DISCHARGE MEDICATIONS:  1. Lortab 5/500 one to two p.o. q. 4 h. p.r.n. pain.  2. Augmentin 500 mg 1 p.o. b.i.d. x7 days.  3. Lopressor 50 mg p.o. b.i.d.   He is to follow up again with myself in 10 days for staple removal and  he is to follow up with  Dr. Juanetta Gosling as previously scheduled.      Tilford Pillar, MD  Electronically Signed     BZ/MEDQ  D:  08/09/2008  T:  08/10/2008  Job:  [id number]   cc:   Ramon Dredge L. Juanetta Gosling, M.D.  Fax: [phone]

## 2010-10-09 NOTE — Op Note (Signed)
NAME:  Warren Mcdonald, Warren Mcdonald NO.:  [id number]   MEDICAL RECORD NO.:  [id number]          PATIENT TYPE:  INP   LOCATION:  A325                          FACILITY:  [facility]   PHYSICIAN:  Tilford Pillar, MD      DATE OF BIRTH:  [birth year]   DATE OF PROCEDURE:  08/06/2008  DATE OF DISCHARGE:                               OPERATIVE REPORT   PREOPERATIVE DIAGNOSIS:  Ruptured appendicitis.   POSTOPERATIVE DIAGNOSIS:  Ruptured gangrenous appendicitis.   PROCEDURE:  Exploratory laparotomy, open appendectomy, and intra-  abdominal drain placement.   SURGEON:  Tilford Pillar, MD   ANESTHESIA:  General endotracheal.   LOCAL ANESTHETIC:  None.   SPECIMEN:  Appendix.   ESTIMATED BLOOD LOSS:  Less than 100 mL.   INDICATIONS:  The patient is a 77 year old male who presented to [facility] with continued abdominal pain symptomatology.  He did have  a CT evaluation in the emergency department which demonstrated a fluid  collection around the appendix consistent with perforated appendicitis.  Based on the patient's recent history and the radiographic and  laboratory findings, it was suspect that the patient did have a ruptured  appendicitis.  Although the possibility of malignant process could not  be ruled out.  This was discussed with the patient's family as well as  the patient.  The patient has had limited medical followup or evaluation  over the last several decades.  Based on the findings, it was  recommended he will undergo an emergent exploratory laparotomy with  planned appendectomy, possible colectomy, possible ostomy.  Risks,  benefits, and alternatives were discussed at length with the patient and  the patient's family including but not limited to risk of bleeding,  infection, appendiceal stump leak,  bowel injury as well as the  possibility of intraoperative cardiac or pulmonary events.  Their  questions and concerns were addressed.  The patient was consented  for  the planned procedure.   OPERATION:  The patient was taken to the operating room and placed in  supine position on the operating table, at which time the general  anesthetic was administered.  Once the patient was asleep, he was  endotracheally intubated by Anesthesia.  At this time, a Foley catheter  was placed in sterile fashion by myself without difficulty.  The  patient's abdomen was then prepped with DuraPrep solution and draped in  a standard fashion.  A midline infraumbilical incision was created with  a scalpel.  A right lateral keyhole defect was created around the  umbilicus.  Additional dissection down through the subcutaneous tissue  was carried out using electrocautery including the division of the  anterior fascia.  On reaching the peritoneum, it was grasped with  hemostats, elevated, it was palpated suture.  No entrapped small  bowel  and cut sharply using Metzenbaum scissors.  Upon entry into the  peritoneal cavity, I further opened the surgical wound superiorly and  anteriorly.  At this time, I did not encounter any thick fluid, feculent  matter or odor.  A Richardson retractor was utilized to retract the  right  lateral abdominal wall as well as the cecum.  This time, I did  identify the inflammatory phlegmon at base of the cecum.  Careful  combination of blunt, sharp, and electrocautery dissection was able to  free the cecum and the appendix from the surrounding phlegmon and  omentum covering, the appendix was clearly necrotic with the anterior  wall appearing gangrenous and noted was scant  spillage from this area.  I gained control of this by placing clamp, then further proximal to the  area, I then continued my dissection towards the cecum.  I carefully  divided the mesoappendix using the LigaSure.  Upon reaching the base of  the appendix, it was noted to be inflammation, but no evidence of any  thickening or mass lesion.  The base of the appendix was wide.  At  this  time, I did opt to divide the appendix at the cecum and bleeding margins  along the cecum were encountered.  The cecum was viable.  The resulting  defect was large enough to insert a digit and with this, I was able to  easily palpate the ileocecal valve which was well away from the area of  sedation.  Due to the size, it did opt this time to close the area with  interrupted sutures using 3-0 silk sutures in a double layer fashion.  These were approximated mucosal layer followed by imbricating the serosa  over the mucosal closure and this was carried out using a 3-0 silk and  Lembert suture fashion.  On dividing the appendix as was placed in the  back table and sent was a permanent specimen to Pathology.  This time, I  copiously irrigated the field.  Hemostasis was excellent.  I through a  separate  stab incision in the right lower abdominal wall, placed a 10  flat JP drain and I placed the drain to lie within the right paracolic  gutter, and this time I did evaluate the remainder of the abdomen, could  not appreciate any evidence of any metastatic lesions or changes.  Looking into bowel, all appeared viable, liver edges were palpated,  noted to be normal in appearance.  Rectosigmoid was normal with no  palpable masses or lesions andat this time, I did additionally rinse the  peritoneal cavity with warm sterile saline.  At this time, I turned  attention to closure.   Prior to closure, I did mobilize the omentum which easily reached the  area making certain that this drain was over the area of repair and then  I began closure of the midline incision __________ subcuticular tissue.  With the fascia closed, I didirrigated the area againand then closed the  skin with skin staples.  A 3-0 nylon suture was utilized to secure the  drain to the skin.  Dressings were placed.  The drapes removed, the  dressings were secured.  The patient was allowed to come out general  anesthetic, was  transferred back to regular hospital bed in stable  condition with plans of  recovering and initial monitoring in the  intensive care unit secondary to the patient's age.  At the conclusion  of the procedure, all instruments, sponge, and needle counts were  correct.  The patient tolerated the procedure extremely well.       Tilford Pillar, MD  Electronically Signed     BZ/MEDQ  D:  08/06/2008  T:  08/07/2008  Job:  [id number]

## 2010-10-09 NOTE — Assessment & Plan Note (Signed)
OFFICE VISIT   Warren Mcdonald, Warren Mcdonald  DOB:  [birth year]                                       12/26/2008  ZOXWR#:[id number]   REASON FOR VISIT:  Right leg ulcer.   HISTORY:  This is a 77 year old gentleman that I am seeing at the  request of Dr. Tanda Rockers for evaluation of a right heel ulcer.  The  patient states that the ulcer has been present for approximately 2  months.  He has undergone debridement of the wound as well as being  placed on antibiotics.  According to the family, it has gotten slightly  smaller.  The patient was recently diagnosed with diabetes while in the  hospital for appendectomy.  He does still continue to walk with a cane.  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  He has a  history of smoking cigars and chewing tobacco but quit approximately 20  years ago.  He also suffers from hypertension.   REVIEW OF SYSTEMS:  GENERAL:  Positive for weight loss, he weighs 200  pounds.  CARDIAC:  Positive shortness of breath when lying flat and with  exertion.  PULMONARY:  Negative.  GI:  Positive for mild constipation, he takes MiraLax.  GU:  Negative.  VASCULAR:  Positive for pain in legs with walking and lying flat.  History of nonhealing ulcers.  History of blood clot in the vein and  phlebitis.  NEURO:  Negative.  ORTHO:  Positive for a sinus headache.  PSYCH:  Positive for nervousness.  ENT:  Positive for sinus problems.  HEME:  Negative.   PAST MEDICAL HISTORY:  Diabetes, hypertension.   PAST SURGICAL HISTORY:  Appendectomy and bilateral cataract extraction  and implants.   FAMILY HISTORY:  Positive for diabetes, prostate cancer.  His father had  heart attack as well as blood clots in the vein, he died at age 36.  Mother had hypertension and heart disease.   SOCIAL HISTORY:  He is married with 6 children.  He is a retired  Arboriculturist.  He does not smoke.  He has a history smoking cigars and  chewing tobacco, but quit  20 years ago.  Does not drink.   MEDICATIONS:  The patient takes diabetic medicines that he does not have  with him.  He is also on doxycycline.  Takes an aspirin.   PHYSICAL EXAMINATION:  Blood pressure 184/87, pulse is 87.  General:  He  is well-appearing, no distress.  HEENT:  Normocephalic, atraumatic.  Pupils equal.  Sclerae are anicteric.  Neck:  Supple.  No JVD.  No  carotid bruits.  Cardiovascular:  Regular rate and rhythm.  Pulmonary:  Lungs are clear bilaterally.  Abdomen:  Soft.  Extremities:  He has  palpable femoral pulses.  Pedal pulses are not palpable.  There is an  approximate 2 cm ulcer on the right lateral heel.  There is no gross  purulence, there is no foul smell.  Neuro:  Cranial nerves II through  XII are grossly intact.  Psych:  He is alert and oriented x3.  Skin:  Without rash.   Diagnostic Studies:  The patient had his ankle brachial index performed  at [facility].  This reveals an ABI of 0.59 on the right, 0.92 on the  left.  His toe pressure on the right is 40  and on the left is 130.   ASSESSMENT/PLAN:  Peripheral vascular disease with ulceration.   Plan:  I discussed with the patient and the family the magnitude of the  disease problem.  He understands that this is a limb-threatening  process.  We have elected to proceed with arteriogram to further define  his arterial pathology.  I told them that we would proceed with  intervention, either by stenting or balloon angioplasty, if possible,  and that he also might require surgical intervention.  The risks and  benefits, including bleeding, were discussed with the patient and his  procedure has been scheduled for Thursday, August 5th.   Jorge Ny, MD  Electronically Signed   VWB/MEDQ  D:  12/26/2008  T:  12/27/2008  Job:  Posey Pronto   cc:   Theresia Majors. Tanda Rockers, M.D.  Edward L. Juanetta Gosling, M.D.

## 2010-10-09 NOTE — Procedures (Signed)
VASCULAR LAB EXAM   INDICATION:  Preop for fem-peroneal bypass graft; ulcer with occlusive  vascular disease.   HISTORY:  Diabetes:  Cardiac:  Hypertension:   EXAM:  Bilateral lower extremity vein mapping.   IMPRESSION:  1. Right greater saphenous vein is compressible from the proximal      thigh to the mid calf with diameters that range from 0.39 to 0.18.  2. Left greater saphenous vein is compressible from the proximal thigh      to the distal calf with diameters that range from 0.51 to 0.26 cm.      The greater saphenous vein has multiple branches coming off.  3. Bilateral smaller saphenous veins have thromboses.  Distal right      greater saphenous vein has evidence of thrombosis also.   ___________________________________________  V. Charlena Cross, MD   NT/MEDQ  D:  12/18/2009  T:  12/18/2009  Job:  [id number]

## 2010-11-22 ENCOUNTER — Inpatient Hospital Stay (HOSPITAL_COMMUNITY): Admission: RE | Admit: 2010-11-22 | Payer: Self-pay | Source: Ambulatory Visit

## 2010-11-29 ENCOUNTER — Ambulatory Visit (HOSPITAL_COMMUNITY): Admission: RE | Admit: 2010-11-29 | Payer: Medicare Other | Source: Ambulatory Visit | Admitting: Ophthalmology

## 2011-04-15 ENCOUNTER — Encounter: Payer: Self-pay | Admitting: Surgery

## 2011-04-26 ENCOUNTER — Encounter: Payer: Self-pay | Admitting: Surgery

## 2011-04-29 ENCOUNTER — Encounter: Payer: Self-pay | Admitting: Surgery

## 2011-04-29 ENCOUNTER — Other Ambulatory Visit (INDEPENDENT_AMBULATORY_CARE_PROVIDER_SITE_OTHER): Payer: Medicare Other | Admitting: *Deleted

## 2011-04-29 ENCOUNTER — Ambulatory Visit (INDEPENDENT_AMBULATORY_CARE_PROVIDER_SITE_OTHER): Payer: Medicare Other | Admitting: Surgery

## 2011-04-29 VITALS — BP 173/87 | HR 57 | Resp 16 | Ht 73.0 in | Wt 210.0 lb

## 2011-04-29 DIAGNOSIS — I739 Peripheral vascular disease, unspecified: Secondary | ICD-10-CM

## 2011-04-29 DIAGNOSIS — M79609 Pain in unspecified limb: Secondary | ICD-10-CM

## 2011-04-29 DIAGNOSIS — I70219 Atherosclerosis of native arteries of extremities with intermittent claudication, unspecified extremity: Secondary | ICD-10-CM

## 2011-04-29 DIAGNOSIS — Z48812 Encounter for surgical aftercare following surgery on the circulatory system: Secondary | ICD-10-CM

## 2011-04-29 NOTE — Progress Notes (Signed)
[facility] [place]   Patient name: Warren Mcdonald MRN: [id number] DOB: 12-19-1929 Sex: male     Chief Complaint  Patient presents with  . pain in limb    left leg painful-2-3 months    Pt did not bring his meds and could not  remember any of them.     HISTORY OF PRESENT ILLNESS: The patient is back today for followup. I have not seen him since July 2011. In August 2011 I performed a left above-knee popliteal artery to posterior tibial artery bypass graft with reversed ipsilateral saphenous vein on the left secondary to a heel ulcer. He has been getting his wound care done at the wound center. For that reason he is not come for followup as he had been wearing a cast and could not get ultrasound. He is no longer performing wound care as his ulcer is nearly healed. He does complain of some numbness in his foot when he stands up. Otherwise he has no complaints at this time.  Past Medical History  Diagnosis Date  . Leg pain   . Diabetes mellitus   . Hypertension   . Hyperlipidemia   . Ulcer   . Peripheral vascular disease     Past Surgical History  Procedure Date  . Appendectomy   . Cataract extraction w/ intraocular lens  implant, bilateral   . Pr vein bypass graft,aorto-fem-pop     left above knee popliteal to posterior tibial artery bypass graft    History   Social History  . Marital Status: Married    Spouse Name: N/A    Number of Children: N/A  . Years of Education: N/A   Occupational History  . Not on file.   Social History Main Topics  . Smoking status: Former Smoker    Types: Cigars    Quit date: 05/27/1988  . Smokeless tobacco: Former Neurosurgeon    Types: Chew    Quit date: 05/27/1988  . Alcohol Use: No  . Drug Use: No  . Sexually Active:    Other Topics Concern  . Not on file   Social History Narrative  . No narrative on file    Family History  Problem Relation Age of Onset  . Heart disease Mother   . Hypertension Mother   .  Heart attack Father   . Diabetes Other   . Cancer Other     prostate    Allergies as of 04/29/2011  . (No Known Allergies)    No current outpatient prescriptions on file prior to visit.     REVIEW OF SYSTEMS: No chest pain no shortness of breath no leg swelling no claudication symptoms  PHYSICAL EXAMINATION:   Vital signs are BP 173/87  Pulse 57  Resp 16  Ht 6\' 1"  (1.854 m)  Wt 210 lb (95.255 kg)  BMI 27.71 kg/m2  SpO2 98% General: The patient appears their stated age. HEENT:  No gross abnormalities Pulmonary:  Non labored breathing Abdomen: Soft and non-tender Musculoskeletal: There are no major deformities. Neurologic: No focal weakness or paresthesias are detected, Skin: There are no ulcer or rashes noted. The heel does not have open areas Psychiatric: The patient has normal affect. Cardiovascular: Palpable left posterior tibial pulse   Diagnostic Studies Ultrasound was ordered and performed today this shows a widely patent bypass graft with an ABI of 1.1  Assessment: Status post left above-knee popliteal to posterior tibial bypass graft with vein secondary to a heel ulcer Plan: The  patient has healed his wound he has no specific vascular complaints at this time he will need to be put off for routine surveillance studies of his bypass graft as he has not been very compliant with his followup. I stressed the importance of doing this. His neck study will be in 6 months. I do not feel his complaints of numbness are related to vascular disease. 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 Charlena Cross, M.D. [facility] [place] Office: [phone] Pager:  [phone]

## 2011-05-13 NOTE — Procedures (Unsigned)
BYPASS GRAFT EVALUATION  INDICATION:  Followup left bypass graft  HISTORY: Diabetes:  Yes Cardiac:  No Hypertension:  Yes Smoking:  Previous Previous Surgery:  Right posterior tibial artery and tibial peroneal trunk PTA on 12/29/2008.  Left above knee popliteal artery to posterior tibial artery bypass graft with reversed ipsilateral vein 12/29/2009.  SINGLE LEVEL ARTERIAL EXAM                              RIGHT              LEFT Brachial:                    158                155 Anterior tibial:             143                163 Posterior tibial:            179                188 Peroneal: Ankle/brachial index:        1.13               1.19  PREVIOUS ABI:  Date:  01/01/2010  RIGHT:  0.26  LEFT:  1.01  LOWER EXTREMITY BYPASS GRAFT DUPLEX EXAM:  DUPLEX:  Patent left above knee popliteal artery to posterior tibial artery bypass graft with triphasic waveforms noted throughout. Please see the following worksheet for all velocity measurements.  IMPRESSION: 1. Patent left above knee popliteal artery to posterior tibial artery     bypass graft with triphasic waveforms. 2. Bilateral ankle brachial indices are within normal limits.  ___________________________________________ V. Charlena Cross, MD  EM/MEDQ  D:  04/29/2011  T:  04/29/2011  Job:  [id number]

## 2011-12-20 ENCOUNTER — Encounter: Payer: Self-pay | Admitting: Surgery

## 2011-12-23 ENCOUNTER — Encounter (INDEPENDENT_AMBULATORY_CARE_PROVIDER_SITE_OTHER): Payer: Medicare Other | Admitting: *Deleted

## 2011-12-23 ENCOUNTER — Ambulatory Visit (INDEPENDENT_AMBULATORY_CARE_PROVIDER_SITE_OTHER): Payer: Medicare Other | Admitting: Surgery

## 2011-12-23 ENCOUNTER — Encounter: Payer: Self-pay | Admitting: Surgery

## 2011-12-23 VITALS — BP 187/91 | HR 64 | Temp 98.0°F | Ht 73.0 in | Wt 199.0 lb

## 2011-12-23 DIAGNOSIS — I70219 Atherosclerosis of native arteries of extremities with intermittent claudication, unspecified extremity: Secondary | ICD-10-CM

## 2011-12-23 DIAGNOSIS — Z48812 Encounter for surgical aftercare following surgery on the circulatory system: Secondary | ICD-10-CM

## 2011-12-23 DIAGNOSIS — I739 Peripheral vascular disease, unspecified: Secondary | ICD-10-CM

## 2011-12-23 NOTE — Procedures (Unsigned)
LOWER EXTREMITY ARTERIAL DUPLEX  INDICATION:  Followup left lower extremity bypass graft.  HISTORY: Diabetes:  Yes Cardiac:  No Hypertension:  Yes Smoking:  Previously Previous Surgery:  Right posterior tibial artery and tibioperoneal trunk PTA on 12/29/2008.  Left above knee popliteal artery to posterior tibial artery bypass graft with reversed ipsilateral vein 12/29/2009.  SINGLE LEVEL ARTERIAL EXAM                         RIGHT                LEFT Brachial: Anterior tibial: Posterior tibial: Peroneal: Ankle/Brachial Index:   0.92                 1.05  PRIOR STUDY 04/29/2011:  1.13  1.19  LOWER EXTREMITY ARTERIAL DUPLEX EXAM  DUPLEX:  Imaging reveals mild plaque throughout the bilateral lower extremity with biphasic and triphasic waveforms.  The left  bypass graft appears patent with no focal stenosis visualized. The right lower extremity appears patent with increased velocity in the mid to distal posterior tibial artery. The right anterior tibial artery appears occluded  and the right peroneal artery is not visualized at the ankle.  IMPRESSION: 1. Patent left above knee popliteal to posterior tibial artery bypass     graft. 2. 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 3. See attached for ankle brachial indices.  ___________________________________________ V. Charlena Cross, MD  SS/MEDQ  D:  12/23/2011  T:  12/23/2011  Job:  [id number]

## 2011-12-23 NOTE — Progress Notes (Signed)
[facility] [place]   Patient name: Warren Mcdonald MRN: [id number] DOB: [birth year] Sex: male     Chief Complaint  Patient presents with  . PAD    6 month f/u - pt c/o of numbness in left leg at times    HISTORY OF PRESENT ILLNESS: The patient is here today for routine followup. He is status post left above-knee popliteal artery to posterior tibial bypass graft with reversed ipsilateral saphenous vein on 12/29/2009. This was done for a left heel ulcer. He is also status post angioplasty of the right posterior tibial and tibial peroneal trunk on 12/29/2008 for a right leg ulcer. He has been doing well without complaints.  Past Medical History  Diagnosis Date  . Leg pain   . Diabetes mellitus   . Hypertension   . Hyperlipidemia   . Ulcer   . Peripheral vascular disease     Past Surgical History  Procedure Date  . Appendectomy   . Cataract extraction w/ intraocular lens  implant, bilateral   . Pr vein bypass graft,aorto-fem-pop     left above knee popliteal to posterior tibial artery bypass graft    History   Social History  . Marital Status: Married    Spouse Name: N/A    Number of Children: N/A  . Years of Education: N/A   Occupational History  . Not on file.   Social History Main Topics  . Smoking status: Former Smoker    Types: Cigars    Quit date: 05/27/1988  . Smokeless tobacco: Former Neurosurgeon    Types: Chew    Quit date: 05/27/1988  . Alcohol Use: No  . Drug Use: No  . Sexually Active: Not on file   Other Topics Concern  . Not on file   Social History Narrative  . No narrative on file    Family History  Problem Relation Age of Onset  . Heart disease Mother   . Hypertension Mother   . Heart attack Mother   . Heart attack Father   . Diabetes Other   . Cancer Other     prostate  . Cancer Sister   . Cancer Daughter     Allergies as of 12/23/2011  . (No Known Allergies)    Current Outpatient Prescriptions on File Prior  to Visit  Medication Sig Dispense Refill  . ALPRAZolam (XANAX) 0.5 MG tablet Take 0.5 mg by mouth 2 (two) times daily as needed.        Marland Kitchen amLODipine (NORVASC) 10 MG tablet Take 2 tabs daily (total of 20 mg)       . aspirin 325 MG tablet Take 325 mg by mouth daily.        . cefUROXime (CEFTIN) 500 MG tablet Take 500 mg by mouth 2 (two) times daily.        . metFORMIN (GLUCOPHAGE) 500 MG tablet Take 500 mg by mouth 2 (two) times daily with a meal.        . metoprolol tartrate (LOPRESSOR) 25 MG tablet Take 25 mg by mouth 2 (two) times daily.        . pravastatin (PRAVACHOL) 40 MG tablet Take 40 mg by mouth daily.           REVIEW OF SYSTEMS: Positive for pain in legs with walking and at rest.. All other systems are negative as documented by the patient in the encounter form  PHYSICAL EXAMINATION:   Vital signs are BP 187/91  Pulse 64  Temp 98 F (36.7 C) (Oral)  Ht 6\' 1"  (1.854 m)  Wt 199 lb (90.266 kg)  BMI 26.25 kg/m2  SpO2 100% General: The patient appears their stated age. HEENT:  No gross abnormalities Pulmonary:  Non labored breathing  Musculoskeletal: There are no major deformities. Neurologic: No focal weakness or paresthesias are detected, Skin: There are no ulcer or rashes noted. Psychiatric: The patient has normal affect. Cardiovascular: There is a regular rate and rhythm without significant murmur appreciated. Pedal pulses are not palpable.   Diagnostic Studies I have ordered and reviewed his ultrasound. This shows an ABI of 0.92 on the right with biphasic waveforms and 1.0 on the left with triphasic waveforms. Duplex of the right and left leg intervention showed no evidence of significant stenosis.  Assessment: Status post bilateral intervention Plan: At this time the patient is doing very well. He has no ulcers. His bypass graft in the left is widely patent. His angioplasty site on the right is widely patent. The patient will continue with yearly surveillance  visits.  Jorge Ny, M.D. [facility] [place] Office: [phone] Pager:  [phone]

## 2012-12-09 ENCOUNTER — Other Ambulatory Visit: Payer: Self-pay | Admitting: *Deleted

## 2012-12-09 DIAGNOSIS — Z48812 Encounter for surgical aftercare following surgery on the circulatory system: Secondary | ICD-10-CM

## 2012-12-09 DIAGNOSIS — I739 Peripheral vascular disease, unspecified: Secondary | ICD-10-CM

## 2012-12-28 ENCOUNTER — Encounter: Payer: Self-pay | Admitting: Surgery

## 2012-12-28 ENCOUNTER — Ambulatory Visit: Payer: Medicare Other | Admitting: Neurosurgery

## 2012-12-28 ENCOUNTER — Encounter (INDEPENDENT_AMBULATORY_CARE_PROVIDER_SITE_OTHER): Payer: 59 | Admitting: *Deleted

## 2012-12-28 ENCOUNTER — Ambulatory Visit (INDEPENDENT_AMBULATORY_CARE_PROVIDER_SITE_OTHER): Payer: 59 | Admitting: Surgery

## 2012-12-28 VITALS — BP 158/69 | HR 61 | Resp 16 | Ht 77.0 in | Wt 205.0 lb

## 2012-12-28 DIAGNOSIS — Z48812 Encounter for surgical aftercare following surgery on the circulatory system: Secondary | ICD-10-CM

## 2012-12-28 DIAGNOSIS — I739 Peripheral vascular disease, unspecified: Secondary | ICD-10-CM

## 2012-12-28 NOTE — Addendum Note (Signed)
Addended by: Adria Dill L on: 12/28/2012 02:17 PM   Modules accepted: Orders

## 2012-12-28 NOTE — Progress Notes (Signed)
[facility] [place]   Patient name: Warren Mcdonald MRN: [id number] DOB: 01-17-1930 Sex: male     Chief Complaint  Patient presents with  . PVD    Vascular Lab study today-Abnormal    HISTORY OF PRESENT ILLNESS: The patient is seen as an add on today. He is status post left above-knee popliteal to posterior tibial artery bypass graft in 2011. In 2010 he underwent angioplasty and recanalization of the occluded tibioperoneal trunk and posterior tibial artery. He is found on ultrasound today to have elevated velocities within his tibioperoneal trunk. The patient remained asymptomatic. He denies having any open wounds.  Past Medical History  Diagnosis Date  . Leg pain   . Diabetes mellitus   . Hypertension   . Hyperlipidemia   . Ulcer   . Peripheral vascular disease     Past Surgical History  Procedure Laterality Date  . Appendectomy    . Cataract extraction w/ intraocular lens  implant, bilateral    . Pr vein bypass graft,aorto-fem-pop      left above knee popliteal to posterior tibial artery bypass graft    History   Social History  . Marital Status: Married    Spouse Name: N/A    Number of Children: N/A  . Years of Education: N/A   Occupational History  . Not on file.   Social History Main Topics  . Smoking status: Former Smoker    Types: Cigars    Quit date: 05/27/1988  . Smokeless tobacco: Former Neurosurgeon    Types: Chew    Quit date: 05/27/1988  . Alcohol Use: No  . Drug Use: No  . Sexually Active: Not on file   Other Topics Concern  . Not on file   Social History Narrative  . No narrative on file    Family History  Problem Relation Age of Onset  . Heart disease Mother   . Hypertension Mother   . Heart attack Mother   . Heart attack Father   . Diabetes Other   . Cancer Other     prostate  . Cancer Sister   . Cancer Daughter     Allergies as of 12/28/2012  . (No Known Allergies)    Current Outpatient Prescriptions on  File Prior to Visit  Medication Sig Dispense Refill  . ALPRAZolam (XANAX) 0.5 MG tablet Take 0.5 mg by mouth 2 (two) times daily as needed.        Marland Kitchen amLODipine (NORVASC) 10 MG tablet Take 2 tabs daily (total of 20 mg)       . aspirin 325 MG tablet Take 325 mg by mouth daily.        . cefUROXime (CEFTIN) 500 MG tablet Take 500 mg by mouth 2 (two) times daily.        . metFORMIN (GLUCOPHAGE) 500 MG tablet Take 500 mg by mouth 2 (two) times daily with a meal.        . metoprolol tartrate (LOPRESSOR) 25 MG tablet Take 25 mg by mouth 2 (two) times daily.        . pravastatin (PRAVACHOL) 40 MG tablet Take 40 mg by mouth daily.         No current facility-administered medications on file prior to visit.     REVIEW OF SYSTEMS: Please see history of present illness, otherwise all systems are negative  PHYSICAL EXAMINATION:   Vital signs are BP 158/69  Pulse 61  Resp 16  Ht 6\' 5"  (1.956  m)  Wt 205 lb (92.987 kg)  BMI 24.3 kg/m2  SpO2 99% General: The patient appears their stated age. HEENT:  No gross abnormalities Pulmonary:  Non labored breathing Musculoskeletal: There are no major deformities. Neurologic: No focal weakness or paresthesias are detected, Skin: There are no ulcer or rashes noted. Psychiatric: The patient has normal affect. Cardiovascular: . Pedal pulses are nonpalpable   Diagnostic Studies Elevated velocities within the right tibioperoneal trunk. Maximum velocity is 410 cm/s. Widely patent left above-knee popliteal to posterior tibial artery bypass graft. ABI on the left is 0.94 with triphasic waveforms. ABI on the right is 0.78 with monophasic waveforms  Assessment: Peripheral vascular disease status post bilateral intervention Plan: The patient is asymptomatic at this time. Therefore, I have elected not to intervene on the stenosis within the tibioperoneal trunk, but rather to have been brought back in 6 months to see if this is increased. If he has had any  symptomatic changes or if the velocity profile has gotten worse I will consider angiography.  Jorge Ny, M.D. [facility] [place] Office: [phone] Pager:  [phone]

## 2013-06-25 ENCOUNTER — Encounter: Payer: Self-pay | Admitting: Family

## 2013-06-28 ENCOUNTER — Encounter: Payer: Self-pay | Admitting: Family

## 2013-06-28 ENCOUNTER — Ambulatory Visit (INDEPENDENT_AMBULATORY_CARE_PROVIDER_SITE_OTHER): Payer: 59 | Admitting: Family

## 2013-06-28 ENCOUNTER — Ambulatory Visit (INDEPENDENT_AMBULATORY_CARE_PROVIDER_SITE_OTHER)
Admission: RE | Admit: 2013-06-28 | Discharge: 2013-06-28 | Disposition: A | Discharge status: Home / Self Care | Payer: Medicare Other | Source: Ambulatory Visit | Attending: Surgery | Admitting: Surgery

## 2013-06-28 ENCOUNTER — Ambulatory Visit (HOSPITAL_COMMUNITY)
Admission: RE | Admit: 2013-06-28 | Discharge: 2013-06-28 | Disposition: A | Discharge status: Home / Self Care | Payer: Medicare Other | Source: Ambulatory Visit | Attending: Family | Admitting: Family

## 2013-06-28 VITALS — BP 141/73 | HR 75 | Resp 16 | Ht 74.0 in | Wt 204.0 lb

## 2013-06-28 DIAGNOSIS — I739 Peripheral vascular disease, unspecified: Secondary | ICD-10-CM

## 2013-06-28 DIAGNOSIS — I70219 Atherosclerosis of native arteries of extremities with intermittent claudication, unspecified extremity: Secondary | ICD-10-CM

## 2013-06-28 DIAGNOSIS — I70209 Unspecified atherosclerosis of native arteries of extremities, unspecified extremity: Secondary | ICD-10-CM | POA: Insufficient documentation

## 2013-06-28 DIAGNOSIS — Z48812 Encounter for surgical aftercare following surgery on the circulatory system: Secondary | ICD-10-CM | POA: Insufficient documentation

## 2013-06-28 NOTE — Patient Instructions (Signed)
Peripheral Vascular Disease Peripheral Vascular Disease (PVD), also called Peripheral Arterial Disease (PAD), is a circulation problem caused by cholesterol (atherosclerotic plaque) deposits in the arteries. PVD commonly occurs in the lower extremities (legs) but it can occur in other areas of the body, such as your arms. The cholesterol buildup in the arteries reduces blood flow which can cause pain and other serious problems. The presence of PVD can place a person at risk for Coronary Artery Disease (CAD).  CAUSES  Causes of PVD can be many. It is usually associated with more than one risk factor such as:   High Cholesterol.  Smoking.  Diabetes.  Lack of exercise or inactivity.  High blood pressure (hypertension).  Obesity.  Family history. SYMPTOMS   When the lower extremities are affected, patients with PVD may experience:  Leg pain with exertion or physical activity. This is called INTERMITTENT CLAUDICATION. This may present as cramping or numbness with physical activity. The location of the pain is associated with the level of blockage. For example, blockage at the abdominal level (distal abdominal aorta) may result in buttock or hip pain. Lower leg arterial blockage may result in calf pain.  As PVD becomes more severe, pain can develop with less physical activity.  In people with severe PVD, leg pain may occur at rest.  Other PVD signs and symptoms:  Leg numbness or weakness.  Coldness in the affected leg or foot, especially when compared to the other leg.  A change in leg color.  Patients with significant PVD are more prone to ulcers or sores on toes, feet or legs. These may take longer to heal or may reoccur. The ulcers or sores can become infected.  If signs and symptoms of PVD are ignored, gangrene may occur. This can result in the loss of toes or loss of an entire limb.  Not all leg pain is related to PVD. Other medical conditions can cause leg pain such  as:  Blood clots (embolism) or Deep Vein Thrombosis.  Inflammation of the blood vessels (vasculitis).  Spinal stenosis. DIAGNOSIS  Diagnosis of PVD can involve several different types of tests. These can include:  Pulse Volume Recording Method (PVR). This test is simple, painless and does not involve the use of X-rays. PVR involves measuring and comparing the blood pressure in the arms and legs. An ABI (Ankle-Brachial Index) is calculated. The normal ratio of blood pressures is 1. As this number becomes smaller, it indicates more severe disease.  < 0.95  indicates significant narrowing in one or more leg vessels.  <0.8 there will usually be pain in the foot, leg or buttock with exercise.  <0.4 will usually have pain in the legs at rest.  <0.25  usually indicates limb threatening PVD.  Doppler detection of pulses in the legs. This test is painless and checks to see if you have a pulses in your legs/feet.  A dye or contrast material (a substance that highlights the blood vessels so they show up on x-ray) may be given to help your caregiver better see the arteries for the following tests. The dye is eliminated from your body by the kidney's. Your caregiver may order blood work to check your kidney function and other laboratory values before the following tests are performed:  Magnetic Resonance Angiography (MRA). An MRA is a picture study of the blood vessels and arteries. The MRA machine uses a large magnet to produce images of the blood vessels.  Computed Tomography Angiography (CTA). A CTA is a   specialized x-ray that looks at how the blood flows in your blood vessels. An IV may be inserted into your arm so contrast dye can be injected.  Angiogram. Is a procedure that uses x-rays to look at your blood vessels. This procedure is minimally invasive, meaning a small incision (cut) is made in your groin. A small tube (catheter) is then inserted into the artery of your groin. The catheter is  guided to the blood vessel or artery your caregiver wants to examine. Contrast dye is injected into the catheter. X-rays are then taken of the blood vessel or artery. After the images are obtained, the catheter is taken out. TREATMENT  Treatment of PVD involves many interventions which may include:  Lifestyle changes:  Quitting smoking.  Exercise.  Following a low fat, low cholesterol diet.  Control of diabetes.  Foot care is very important to the PVD patient. Good foot care can help prevent infection.  Medication:  Cholesterol-lowering medicine.  Blood pressure medicine.  Anti-platelet drugs.  Certain medicines may reduce symptoms of Intermittent Claudication.  Interventional/Surgical options:  Angioplasty. An Angioplasty is a procedure that inflates a balloon in the blocked artery. This opens the blocked artery to improve blood flow.  Stent Implant. A wire mesh tube (stent) is placed in the artery. The stent expands and stays in place, allowing the artery to remain open.  Peripheral Bypass Surgery. This is a surgical procedure that reroutes the blood around a blocked artery to help improve blood flow. This type of procedure may be performed if Angioplasty or stent implants are not an option. SEEK IMMEDIATE MEDICAL CARE IF:   You develop pain or numbness in your arms or legs.  Your arm or leg turns cold, becomes blue in color.  You develop redness, warmth, swelling and pain in your arms or legs. MAKE SURE YOU:   Understand these instructions.  Will watch your condition.  Will get help right away if you are not doing well or get worse. Document Released: 06/20/2004 Document Revised: 08/05/2011 Document Reviewed: 05/17/2008 [employer] Patient Information 2014 [employer].  

## 2013-06-28 NOTE — Progress Notes (Signed)
[facility] [place] HISTORY AND PHYSICAL -PAD   History of Present Illness Warren Mcdonald is a 80 y.o. male patient of Dr. Myra Gianotti who is status post left above-knee popliteal to posterior tibial artery bypass graft in 2011. In 2010 he underwent angioplasty and recanalization of the occluded tibioperoneal trunk and posterior tibial artery. 6 months ago he had elevated velocities within the right tibioperoneal trunk. Maximum velocity was 410 cm/s. Widely patent left above-knee popliteal to posterior tibial artery bypass graft. ABI on the left is 0.94 with triphasic waveforms. ABI on the right is 0.78 with monophasic waveforms. He returns today for re-evaluation of bilateral LE arterial perfusion. It is difficult to ascertain, but it seems like he does not have claudication symptoms in either legs, but does have numbness in anterior thigh that radiates to left foot, worse with standing for long periods, not necessarily worse with walking. He is requesting a rolling walker with a seat so that he can walk further, then rest. It seems his left leg gets weak. Pt denies non-healing wounds. Pt denies any history of stroke or TIA.  Pt Diabetic: Yes, seems in good control Pt smoker: non-smoker  Pt meds include: Statin :Yes ASA: Yes Other anticoagulants/antiplatelets: no  Past Medical History  Diagnosis Date  . Leg pain   . Diabetes mellitus   . Hypertension   . Hyperlipidemia   . Ulcer   . Peripheral vascular disease     Social History History  Substance Use Topics  . Smoking status: Former Smoker    Types: Cigars    Quit date: 05/27/1988  . Smokeless tobacco: Former Neurosurgeon    Types: Chew    Quit date: 05/27/1988  . Alcohol Use: No    Family History Family History  Problem Relation Age of Onset  . Heart disease Mother   . Hypertension Mother   . Heart attack Mother   . Heart attack Father   . Diabetes Other   . Cancer Other     prostate  . Cancer  Sister   . Cancer Daughter     Past Surgical History  Procedure Laterality Date  . Appendectomy    . Cataract extraction w/ intraocular lens  implant, bilateral    . Pr vein bypass graft,aorto-fem-pop      left above knee popliteal to posterior tibial artery bypass graft    No Known Allergies  Current Outpatient Prescriptions  Medication Sig Dispense Refill  . ALPRAZolam (XANAX) 0.5 MG tablet Take 0.5 mg by mouth 2 (two) times daily as needed.        Marland Kitchen amLODipine (NORVASC) 10 MG tablet Take 2 tabs daily (total of 20 mg)       . aspirin 325 MG tablet Take 325 mg by mouth daily.        . cefUROXime (CEFTIN) 500 MG tablet Take 500 mg by mouth 2 (two) times daily.        . metFORMIN (GLUCOPHAGE) 500 MG tablet Take 500 mg by mouth 2 (two) times daily with a meal.        . metoprolol tartrate (LOPRESSOR) 25 MG tablet Take 25 mg by mouth 2 (two) times daily.        . pravastatin (PRAVACHOL) 40 MG tablet Take 40 mg by mouth daily.        Marland Kitchen amLODipine-benazepril (LOTREL) 5-20 MG per capsule        No current facility-administered medications for this visit.    ROS: See  HPI for pertinent positives and negatives.   Physical Examination  Filed Vitals:   06/28/13 1615  BP: 141/73  Pulse: 75  Resp: 16   Filed Weights   06/28/13 1615  Weight: 204 lb (92.534 kg)   Body mass index is 26.18 kg/(m^2).  General: A&O x 3, WDWN,  Gait: normal Eyes: PERRLA, Pulmonary: CTAB, without wheezes , rales or rhonchi Cardiac: regular Rythm , without murmur          Carotid Bruits Left Right   Negative Negative   Radial pulses: 2+ and =                           VASCULAR EXAM: Extremities without ischemic changes  without Gangrene; without open wounds.                                                                                                          LE Pulses LEFT RIGHT       FEMORAL   palpable   palpable        POPLITEAL  not palpable   not palpable       POSTERIOR TIBIAL    palpable    palpable        DORSALIS PEDIS      ANTERIOR TIBIAL not palpable  not palpable    Abdomen: soft, NT, no masses. Skin: no rashes, no ulcers noted. Musculoskeletal: no muscle wasting or atrophy.  Neurologic: A&O X 3; Appropriate Affect ; SENSATION: normal; MOTOR FUNCTION:  moving all extremities equally, motor strength 5/5 throughout. Speech is fluent/normal. CN 2-12 intact.    Non-Invasive Vascular Imaging: DATE: 06/28/2013 ABI: RIGHT 0.86, Waveforms: biphasic;  LEFT 1.27, Waveforms: tri and biphasic DUPLEX SCAN OF BYPASS: >50% stenosis right TPT. Patent left FPBG with velocities WNL. Highest velocity at 300 cm/sec LOWER EXTREMITY ARTERIAL DUPLEX EVALUATION    INDICATION: Peripheral arterial disease    PREVIOUS INTERVENTION(S): 2010- PTA of right tibio-peroneal trunk;  2011 - left popliteal - distal posterior tibial artery bypass graft.      DUPLEX EXAM:     RIGHT  LEFT   Peak Systolic Velocity (cm/s) Ratio (if abnormal) Waveform  Peak Systolic Velocity (cm/s) Ratio (if abnormal) Waveform  See  below  Inflow Artery 117       Proximal Anastomosis 96       Proximal Graft 60       Mid Graft 66        Distal Graft 80       Distal Anastomosis 79       Outflow Artery 186    .86/.61 Today's ABI / TBI 1.27/.88  .78/.57 Previous ABI / TBI (12/28/12 ) .94/.92    Waveform:    M - Monophasic       B - Biphasic       T - Triphasic  If Ankle Brachial Index (ABI) or Toe Brachial Index (TBI) performed, please see complete report     ADDITIONAL FINDINGS: There is diffuse mixed plaque in  the mid to distal right superficial femoral and popliteal arteries and in the tibio-peroneal trunk.  Velocities within normal limits and biphasic waveforms are noted in the right superficial femoral artery. A very elevated velocity of >300cm/s is present in the right tibio-peroneal trunk.  A biphasic waveform is present in the distal right peroneal artery with a monophasic waveform in the distal  posterior tibial artery.  No flow is present in the distal anterior tibial artery.   1.  >50% stenosis of the right tibio-peroneal trunk.   2. Patent left popliteal-posterior tibial artery bypass graft with velocities within normal limits.        Compared to the previous exam:  No significant change since the previous exam performed on 12/28/12.         ASSESSMENT: Warren Mcdonald is a 80 y.o. male  who is status post left above-knee popliteal to posterior tibial artery bypass graft in 2011. In 2010 he underwent angioplasty and recanalization of the occluded tibioperoneal trunk and posterior tibial artery. His ABI's are slightly improved in 6 months, mild arterial occlusive disease in right leg, normal in left. His symptoms of numbness in the left thigh and occasionally numbness to the left foot are therefore not related to arterial occlusive disease in the left leg. His symptoms are more suggestive of lumbar spine disease, but he denies knowing of any such history. Consider ortho or neurosurgery evaluation, will defer to his PCP.  PLAN:  Patient requested a rolling walker with a folding seat, he lives in [place], prescription given. I discussed in depth with the patient the nature of atherosclerosis, and emphasized the importance of maximal medical management including strict control of blood pressure, blood glucose, and lipid levels, obtaining regular exercise, and cessation of smoking.  The patient is aware that without maximal medical management the underlying atherosclerotic disease process will progress, limiting the benefit of any interventions. Based on the patient's vascular studies and examination, pt will return to clinic in 6 months for ABI's and bilateral LE arterial Duplex.  The patient was given information about PAD including signs, symptoms, treatment, what symptoms should prompt the patient to seek immediate medical care, and risk reduction measures to take.  Charisse March, RN, MSN, FNP-C [facility] Phone: [phone]  Clinic MD: Myra Gianotti  06/28/2013 4:09 PM

## 2013-07-21 ENCOUNTER — Other Ambulatory Visit: Payer: Self-pay | Admitting: Surgery

## 2013-07-21 DIAGNOSIS — I739 Peripheral vascular disease, unspecified: Secondary | ICD-10-CM

## 2013-07-21 DIAGNOSIS — Z48812 Encounter for surgical aftercare following surgery on the circulatory system: Secondary | ICD-10-CM

## 2013-07-21 DIAGNOSIS — I70219 Atherosclerosis of native arteries of extremities with intermittent claudication, unspecified extremity: Secondary | ICD-10-CM

## 2013-12-24 ENCOUNTER — Encounter: Payer: Self-pay | Admitting: Family

## 2013-12-27 ENCOUNTER — Ambulatory Visit (INDEPENDENT_AMBULATORY_CARE_PROVIDER_SITE_OTHER)
Admission: RE | Admit: 2013-12-27 | Discharge: 2013-12-27 | Disposition: A | Discharge status: Home / Self Care | Payer: Medicare Other | Source: Ambulatory Visit

## 2013-12-27 ENCOUNTER — Encounter: Payer: Self-pay | Admitting: Family

## 2013-12-27 ENCOUNTER — Ambulatory Visit (INDEPENDENT_AMBULATORY_CARE_PROVIDER_SITE_OTHER): Payer: Medicare Other | Admitting: Family

## 2013-12-27 ENCOUNTER — Ambulatory Visit (HOSPITAL_COMMUNITY)
Admission: RE | Admit: 2013-12-27 | Discharge: 2013-12-27 | Disposition: A | Discharge status: Home / Self Care | Payer: Medicare Other | Source: Ambulatory Visit | Attending: Family | Admitting: Family

## 2013-12-27 VITALS — BP 209/79 | HR 61 | Resp 16 | Ht 73.0 in | Wt 201.0 lb

## 2013-12-27 DIAGNOSIS — I70219 Atherosclerosis of native arteries of extremities with intermittent claudication, unspecified extremity: Secondary | ICD-10-CM | POA: Diagnosis present

## 2013-12-27 DIAGNOSIS — M79609 Pain in unspecified limb: Secondary | ICD-10-CM | POA: Insufficient documentation

## 2013-12-27 DIAGNOSIS — I739 Peripheral vascular disease, unspecified: Secondary | ICD-10-CM

## 2013-12-27 DIAGNOSIS — Z48812 Encounter for surgical aftercare following surgery on the circulatory system: Secondary | ICD-10-CM | POA: Insufficient documentation

## 2013-12-27 DIAGNOSIS — M79606 Pain in leg, unspecified: Secondary | ICD-10-CM

## 2013-12-27 NOTE — Progress Notes (Signed)
[facility] [place] HISTORY AND PHYSICAL -PAD  History of Present Illness Warren Mcdonald is a 80 y.o. male patient of Dr. Myra Gianotti who is status post left above-knee popliteal to posterior tibial artery bypass graft in 2011. In 2010 he underwent angioplasty and recanalization of the occluded tibioperoneal trunk and posterior tibial artery.  In 2014 he had elevated velocities within the right tibioperoneal trunk. Maximum velocity was 410 cm/s.   He returns today for re-evaluation of bilateral LE arterial perfusion.  It is difficult to ascertain, but it seems like he does not have claudication symptoms in either leg, but does have numbness in anterior thigh that radiates to left foot, worse with standing for long periods, not necessarily worse with walking.  He states that he used to walk 3x around a school track and will resume, denies problems with falling. He states he received his rolling walker and appreciates it's use. Pt denies non-healing wounds.  Pt denies any history of stroke or TIA.   Pt Diabetic: Yes, seems in good control  Pt smoker: non-smoker  Pt meds include:  Statin :Yes  ASA: Yes  Other anticoagulants/antiplatelets: no   Past Medical History  Diagnosis Date  . Leg pain   . Diabetes mellitus   . Hypertension   . Hyperlipidemia   . Ulcer   . Peripheral vascular disease     Social History History  Substance Use Topics  . Smoking status: Former Smoker    Types: Cigars    Quit date: 05/27/1988  . Smokeless tobacco: Former Neurosurgeon    Types: Chew    Quit date: 05/27/1988  . Alcohol Use: No    Family History Family History  Problem Relation Age of Onset  . Heart disease Mother   . Hypertension Mother   . Heart attack Mother   . Heart attack Father   . Heart disease Father     Heart disease before age 77  . Diabetes Other   . Cancer Other     prostate  . Cancer Sister   . Deep vein thrombosis Sister   . Diabetes Sister    amputation-right leg  . Cancer Daughter   . Cancer Brother     Past Surgical History  Procedure Laterality Date  . Appendectomy    . Cataract extraction w/ intraocular lens  implant, bilateral    . Pr vein bypass graft,aorto-fem-pop      left above knee popliteal to posterior tibial artery bypass graft    No Known Allergies  Current Outpatient Prescriptions  Medication Sig Dispense Refill  . ALPRAZolam (XANAX) 0.5 MG tablet Take 0.5 mg by mouth 2 (two) times daily as needed.        Marland Kitchen amLODipine (NORVASC) 10 MG tablet Take 2 tabs daily (total of 20 mg)       . amLODipine-benazepril (LOTREL) 5-20 MG per capsule       . aspirin 325 MG tablet Take 325 mg by mouth daily.        . cefUROXime (CEFTIN) 500 MG tablet Take 500 mg by mouth 2 (two) times daily.        . metFORMIN (GLUCOPHAGE) 500 MG tablet Take 500 mg by mouth 2 (two) times daily with a meal.        . metoprolol tartrate (LOPRESSOR) 25 MG tablet Take 25 mg by mouth 2 (two) times daily.        . pravastatin (PRAVACHOL) 40 MG tablet Take 40 mg by mouth  daily.         No current facility-administered medications for this visit.    ROS: See HPI for pertinent positives and negatives.   Physical Examination  Filed Vitals:   12/27/13 1453  BP: 209/79  Pulse: 61  Resp: 16  Height: 6\' 1"  (1.854 m)  Weight: 201 lb (91.173 kg)  SpO2: 100%   Body mass index is 26.52 kg/(m^2).  General: A&O x 3, WDWN,  Gait: normal  Eyes: PERRLA,  Pulmonary: CTAB, without wheezes , rales or rhonchi  Cardiac: regular Rythm , without murmur   Carotid Bruits  Left  Right    Negative  Negative    Radial pulses: 2+ and =   VASCULAR EXAM:  Extremities without ischemic changes  without Gangrene; without open wounds.  LE Pulses  LEFT  RIGHT   FEMORAL  palpable  palpable   POPLITEAL   palpable   palpable   POSTERIOR TIBIAL  palpable  Not palpable   DORSALIS PEDIS  ANTERIOR TIBIAL   palpable  not palpable    Abdomen: soft, NT, no  masses.  Skin: no rashes, no ulcers noted.  Musculoskeletal: no muscle wasting or atrophy.  Neurologic: A&O X 3; Appropriate Affect ; SENSATION: normal; MOTOR FUNCTION: moving all extremities equally, motor strength 5/5 throughout. Speech is fluent/normal. CN 2-12 intact.   Non-Invasive Vascular Imaging: DATE: 12/27/2013 LOWER EXTREMITY ARTERIAL EVALUATION    INDICATION: Follow-up bilateral lower extremity peripheral vascular disease    PREVIOUS INTERVENTION(S): Angioplasty of right TP trunk 12/2008 Left AK popliteal- posterior tibial artery graft placed 12/2009    DUPLEX EXAM: Duplex evaluation of the lower extremity arterial system to include the common femoral, superficial femoral, popliteal, and tibial arteries and bypass graft(s) and/or stent(s) if present.      FINDINGS:                                       RIGHT LOWER EXTREMITY:  Moderate diffuse disease is observed in the native superficial femoral artery with focal stenosis of >50% in the distal thigh. Velocities of 286cm/second are observed in the PT trunk; however, this area was difficult to evaluate due to calcification which may obscure higher velocities. Moderate diffuse disease is observed throughout the native posterior tibial artery.    LEFT LOWER EXTREMITY:  Patent above-knee popliteal- distal posterior tibial artery graft with elevated velocities (241cm/second) at the distal anastomosis likely due to caliber change; no disease is visualized.      See the attached diagram for velocities.  IMPRESSION:  1. >50% stenosis of the right distal superficial femoral artery. 2. >50% stenosis of the right TP trunk. 3. Widely patent left popliteal-posterior tibial artery graft without evidence of restenosis or hyperplasia.     ANKLE/BRACHIAL INDEX - Right = .77                     Left =   1.03         (Please see complete report)                  ASSESSMENT: Warren Mcdonald is a 80 y.o. male  who is status post  left above-knee popliteal to posterior tibial artery bypass graft in 2011. In 2010 he underwent angioplasty and recanalization of the occluded tibioperoneal trunk and posterior tibial artery.  It is difficult to ascertain, but it seems like he  does not have claudication symptoms in either leg, but does have numbness in anterior thigh that radiates to left foot, worse with standing for long periods, not necessarily worse with walking, no non healing wounds. Bilateral LE arterial Duplex today demonstrates >50% stenosis of the right distal superficial femoral artery, >50% stenosis of the right TP trunk. Widely patent left popliteal-posterior tibial artery graft without evidence of restenosis or hyperplasia.    PLAN:  Graduated walking program. I discussed in depth with the patient the nature of atherosclerosis, and emphasized the importance of maximal medical management including strict control of blood pressure, blood glucose, and lipid levels, obtaining regular exercise, and continued cessation of smoking.  The patient is aware that without maximal medical management the underlying atherosclerotic disease process will progress, limiting the benefit of any interventions.  Based on the patient's vascular studies and examination, pt will return to clinic in 6 months with ABI's and bilateral LE arterial Duplex.  The patient was given information about PAD including signs, symptoms, treatment, what symptoms should prompt the patient to seek immediate medical care, and risk reduction measures to take.  Charisse MarchSuzanne Faiz Weber, RN, MSN, FNP-C [facility] [place] Office Phone: [phone]  Clinic MD: Myra GianottiBrabham  12/27/2013 2:50 PM

## 2013-12-27 NOTE — Patient Instructions (Signed)

## 2014-06-23 ENCOUNTER — Encounter: Payer: Self-pay | Admitting: Family

## 2014-06-27 ENCOUNTER — Ambulatory Visit (HOSPITAL_COMMUNITY)
Admission: RE | Admit: 2014-06-27 | Discharge: 2014-06-27 | Disposition: A | Discharge status: Home / Self Care | Payer: Medicare Other | Source: Ambulatory Visit | Attending: Family | Admitting: Family

## 2014-06-27 ENCOUNTER — Ambulatory Visit (INDEPENDENT_AMBULATORY_CARE_PROVIDER_SITE_OTHER): Payer: 59 | Admitting: Family

## 2014-06-27 ENCOUNTER — Encounter: Payer: Self-pay | Admitting: Family

## 2014-06-27 ENCOUNTER — Ambulatory Visit (INDEPENDENT_AMBULATORY_CARE_PROVIDER_SITE_OTHER)
Admission: RE | Admit: 2014-06-27 | Discharge: 2014-06-27 | Disposition: A | Discharge status: Home / Self Care | Payer: Medicare Other | Source: Ambulatory Visit | Attending: Family | Admitting: Family

## 2014-06-27 ENCOUNTER — Other Ambulatory Visit: Payer: Self-pay | Admitting: *Deleted

## 2014-06-27 VITALS — BP 171/77 | HR 58 | Resp 14 | Ht 73.0 in | Wt 207.0 lb

## 2014-06-27 DIAGNOSIS — E1159 Type 2 diabetes mellitus with other circulatory complications: Secondary | ICD-10-CM

## 2014-06-27 DIAGNOSIS — Z95828 Presence of other vascular implants and grafts: Secondary | ICD-10-CM

## 2014-06-27 DIAGNOSIS — E1151 Type 2 diabetes mellitus with diabetic peripheral angiopathy without gangrene: Secondary | ICD-10-CM

## 2014-06-27 DIAGNOSIS — Z9889 Other specified postprocedural states: Secondary | ICD-10-CM | POA: Diagnosis not present

## 2014-06-27 DIAGNOSIS — M79606 Pain in leg, unspecified: Secondary | ICD-10-CM

## 2014-06-27 DIAGNOSIS — I739 Peripheral vascular disease, unspecified: Secondary | ICD-10-CM

## 2014-06-27 NOTE — Patient Instructions (Signed)

## 2014-06-27 NOTE — Progress Notes (Signed)
[facility] [place] HISTORY AND PHYSICAL -PAD  History of Present Illness Warren Mcdonald is a 81 y.o. male  patient of Dr. Myra Gianotti who is status post left above-knee popliteal to posterior tibial artery bypass graft in 2011. In 2010 he underwent angioplasty and recanalization of the right occluded tibioperoneal trunk and posterior tibial artery.  In 2014 he had elevated velocities within the right tibioperoneal trunk. Maximum velocity was 410 cm/s.   He returns today for re-evaluation of bilateral LE arterial perfusion.  It is difficult to ascertain, but it seems like he does not have claudication symptoms in either leg, but does have numbness in left anterior thigh that radiates to left foot, worse with standing for long periods, not necessarily worse with walking.  He states that he used to walk 3x around a school track and will resume, denies problems with falling. He states he received his rolling walker and appreciates it's use. Pt denies non-healing wounds.  Pt denies any history of stroke or TIA.   Somewhat hypertensive today, states he sees his PCP next week.  He started having intermittent left 2-4th toes pain about November 2015, denies injury.  Pt Diabetic: Yes, seems in good control  Pt smoker: non-smoker   Pt meds include:  Statin :Yes  ASA: Yes  Other anticoagulants/antiplatelets: no     Past Medical History  Diagnosis Date  . Leg pain   . Diabetes mellitus   . Hypertension   . Hyperlipidemia   . Ulcer   . Peripheral vascular disease     Social History History  Substance Use Topics  . Smoking status: Former Smoker    Types: Cigars    Quit date: 05/27/1988  . Smokeless tobacco: Former Neurosurgeon    Types: Chew    Quit date: 05/27/1988  . Alcohol Use: No    Family History Family History  Problem Relation Age of Onset  . Heart disease Mother   . Hypertension Mother   . Heart attack Mother   . Heart attack Father   . Heart  disease Father     Heart disease before age 39  . Diabetes Other   . Cancer Other     prostate  . Cancer Sister   . Deep vein thrombosis Sister   . Diabetes Sister     amputation-right leg  . Cancer Daughter   . Diabetes Daughter   . Cancer Brother     Past Surgical History  Procedure Laterality Date  . Appendectomy    . Cataract extraction w/ intraocular lens  implant, bilateral    . Pr vein bypass graft,aorto-fem-pop      left above knee popliteal to posterior tibial artery bypass graft  . Eye surgery      No Known Allergies  Current Outpatient Prescriptions  Medication Sig Dispense Refill  . aspirin 325 MG tablet Take 325 mg by mouth daily.      . metFORMIN (GLUCOPHAGE) 500 MG tablet Take 500 mg by mouth 2 (two) times daily with a meal.      . metoprolol tartrate (LOPRESSOR) 25 MG tablet Take 25 mg by mouth 2 (two) times daily.      . pravastatin (PRAVACHOL) 40 MG tablet Take 40 mg by mouth daily.      Marland Kitchen ALPRAZolam (XANAX) 0.5 MG tablet Take 0.5 mg by mouth 2 (two) times daily as needed.      Marland Kitchen amLODipine (NORVASC) 10 MG tablet Take 2 tabs daily (total of 20  mg)     . amLODipine-benazepril (LOTREL) 5-20 MG per capsule     . cefUROXime (CEFTIN) 500 MG tablet Take 500 mg by mouth 2 (two) times daily.       No current facility-administered medications for this visit.    ROS: See HPI for pertinent positives and negatives.   Physical Examination  Filed Vitals:   06/27/14 1151  BP: 171/77  Pulse: 58  Resp: 14  Height:  (1.854 m)  Weight: 207 lb (93.895 kg)  SpO2: 99%   Body mass index is 27.32 kg/(m^2).  General: A&O x 3, WDWN,  Gait: normal  Eyes: PERRLA,  Pulmonary: CTAB, without wheezes , rales or rhonchi  Cardiac: regular Rythm , without murmur   Carotid Bruits  Left  Right    Negative  Negative   Aorta is not palpable Radial pulses: 2+ and =   VASCULAR EXAM:  Extremities without ischemic changes  without Gangrene; without open  wounds.   LE Pulses  LEFT  RIGHT   FEMORAL  palpable  palpable   POPLITEAL  palpable  palpable   POSTERIOR TIBIAL  palpable  Not palpable   DORSALIS PEDIS  ANTERIOR TIBIAL  palpable  not palpable    Abdomen: soft, NT, no palpable masses.  Skin: no rashes, no ulcers noted.  Musculoskeletal: no muscle wasting or atrophy.  Neurologic: A&O X 3; Appropriate Affect ; SENSATION: normal; MOTOR FUNCTION: moving all extremities equally, motor strength 5/5 throughout. Speech is fluent/normal. CN 2-12 intact.    Non-Invasive Vascular Imaging: DATE: 06/27/2014 LOWER EXTREMITY ARTERIAL DUPLEX EVALUATION    INDICATION: Follow up bypass graft     PREVIOUS INTERVENTION(S): Left above knee popliteal to distal posterior tibial artery bypass graft on 12/30/09; Right TP trunk angioplasty in August 2010    DUPLEX EXAM:     RIGHT  LEFT   Peak Systolic Velocity (cm/s) Ratio (if abnormal) Waveform  Peak Systolic Velocity (cm/s) Ratio (if abnormal) Waveform     Inflow Artery 194  B     Proximal Anastomosis 157  B     Proximal Graft 70  B     Mid Graft 59  B      Distal Graft 97  B     Distal Anastomosis 101  B     Outflow Artery 176/251  B  0.88 Today's ABI / TBI 1.07  0.77 Previous ABI / TBI (12/27/2013 ) 1.03    Waveform:    M - Monophasic       B - Biphasic       T - Triphasic  If Ankle Brachial Index (ABI) or Toe Brachial Index (TBI) performed, please see complete report     ADDITIONAL FINDINGS: See attached diagram for additional information.    IMPRESSION: 1. Patent left leg bypass graft with no evidence of stenosis noted. 2. Doppler velocities suggest possible greater than 50% stenoses of the right distal superficial femoral artery, right TP trunk and left distal posterior tibial artery.    Compared to the previous exam:  No significant change noted when compared to the exam on 12/27/13.     ASSESSMENT: Warren Mcdonald is a 81 y.o. male who is status post left  above-knee popliteal to posterior tibial artery bypass graft in 2011. In 2010 he underwent angioplasty and recanalization of the right occluded tibioperoneal trunk and posterior tibial artery.  In 2014 he had elevated velocities within the right tibioperoneal trunk. Maximum velocity was 410 cm/s.  He has  no LE tissue loss, doe not seem to have claudication symptoms. Today's bilateral LE arterial Duplex reveals a patent left leg bypass graft with no evidence of stenosis noted. Doppler velocities suggest possible greater than 50% stenoses of the right distal superficial femoral artery, right TP trunk and left distal posterior tibial artery. No significant change noted when compared to the exam on 12/27/13. Left ABI remains normal with triphasic waveforms, right ABI improved from moderate to mild arterial occlusive disease with biphasic waveforms.   Face to face time with patient was 25 minutes. Over 50% of this time was spent on counseling and coordination of care.    PLAN:  Graduated walking program and/or daily seated leg exercises.  I discussed in depth with the patient the nature of atherosclerosis, and emphasized the importance of maximal medical management including strict control of blood pressure, blood glucose, and lipid levels, obtaining regular exercise, and continued cessation of smoking.  The patient is aware that without maximal medical management the underlying atherosclerotic disease process will progress, limiting the benefit of any interventions.  Based on the patient's vascular studies and examination, pt will return to clinic in 1 year with ABI's and bilateral LE arterial Duplex.  The patient was given information about PAD including signs, symptoms, treatment, what symptoms should prompt the patient to seek immediate medical care, and risk reduction measures to take.  Charisse March, RN, MSN, FNP-C [facility] [place] Phone:  [phone]  Clinic MD: Myra Gianotti  06/27/2014  12:06 PM

## 2014-09-02 ENCOUNTER — Inpatient Hospital Stay (HOSPITAL_COMMUNITY)
Admission: EM | Admit: 2014-09-02 | Discharge: 2014-09-13 | DRG: 414 | Disposition: A | Discharge status: Home Health | Payer: Medicare Other | Attending: Pulmonary Disease | Admitting: Pulmonary Disease

## 2014-09-02 ENCOUNTER — Encounter (HOSPITAL_COMMUNITY): Payer: Self-pay | Admitting: *Deleted

## 2014-09-02 ENCOUNTER — Emergency Department (HOSPITAL_COMMUNITY): Payer: Medicare Other

## 2014-09-02 DIAGNOSIS — I739 Peripheral vascular disease, unspecified: Secondary | ICD-10-CM | POA: Diagnosis present

## 2014-09-02 DIAGNOSIS — K8000 Calculus of gallbladder with acute cholecystitis without obstruction: Principal | ICD-10-CM | POA: Diagnosis present

## 2014-09-02 DIAGNOSIS — I248 Other forms of acute ischemic heart disease: Secondary | ICD-10-CM | POA: Diagnosis present

## 2014-09-02 DIAGNOSIS — I4891 Unspecified atrial fibrillation: Secondary | ICD-10-CM | POA: Diagnosis not present

## 2014-09-02 DIAGNOSIS — Z833 Family history of diabetes mellitus: Secondary | ICD-10-CM | POA: Diagnosis not present

## 2014-09-02 DIAGNOSIS — N179 Acute kidney failure, unspecified: Secondary | ICD-10-CM | POA: Diagnosis not present

## 2014-09-02 DIAGNOSIS — I481 Persistent atrial fibrillation: Secondary | ICD-10-CM | POA: Diagnosis not present

## 2014-09-02 DIAGNOSIS — Z87891 Personal history of nicotine dependence: Secondary | ICD-10-CM

## 2014-09-02 DIAGNOSIS — E785 Hyperlipidemia, unspecified: Secondary | ICD-10-CM | POA: Diagnosis present

## 2014-09-02 DIAGNOSIS — K5900 Constipation, unspecified: Secondary | ICD-10-CM | POA: Diagnosis present

## 2014-09-02 DIAGNOSIS — I10 Essential (primary) hypertension: Secondary | ICD-10-CM | POA: Diagnosis present

## 2014-09-02 DIAGNOSIS — R1033 Periumbilical pain: Secondary | ICD-10-CM

## 2014-09-02 DIAGNOSIS — Z8042 Family history of malignant neoplasm of prostate: Secondary | ICD-10-CM | POA: Diagnosis not present

## 2014-09-02 DIAGNOSIS — E119 Type 2 diabetes mellitus without complications: Secondary | ICD-10-CM | POA: Diagnosis not present

## 2014-09-02 DIAGNOSIS — D649 Anemia, unspecified: Secondary | ICD-10-CM | POA: Diagnosis present

## 2014-09-02 DIAGNOSIS — Z8249 Family history of ischemic heart disease and other diseases of the circulatory system: Secondary | ICD-10-CM | POA: Diagnosis not present

## 2014-09-02 DIAGNOSIS — R109 Unspecified abdominal pain: Secondary | ICD-10-CM

## 2014-09-02 DIAGNOSIS — I5031 Acute diastolic (congestive) heart failure: Secondary | ICD-10-CM | POA: Diagnosis not present

## 2014-09-02 DIAGNOSIS — K810 Acute cholecystitis: Secondary | ICD-10-CM

## 2014-09-02 DIAGNOSIS — K750 Abscess of liver: Secondary | ICD-10-CM | POA: Diagnosis present

## 2014-09-02 HISTORY — DX: Essential (primary) hypertension: I10

## 2014-09-02 HISTORY — DX: Type 2 diabetes mellitus without complications: E11.9

## 2014-09-02 HISTORY — DX: Peptic ulcer, site unspecified, unspecified as acute or chronic, without hemorrhage or perforation: K27.9

## 2014-09-02 LAB — COMPREHENSIVE METABOLIC PANEL
ALT: 31 U/L (ref 0–53)
AST: 51 U/L — AB (ref 0–37)
Albumin: 2.9 g/dL — ABNORMAL LOW (ref 3.5–5.2)
Alkaline Phosphatase: 91 U/L (ref 39–117)
Anion gap: 10 (ref 5–15)
BUN: 25 mg/dL — ABNORMAL HIGH (ref 6–23)
CALCIUM: 8.9 mg/dL (ref 8.4–10.5)
CO2: 23 mmol/L (ref 19–32)
CREATININE: 1.72 mg/dL — AB (ref 0.50–1.35)
Chloride: 99 mmol/L (ref 96–112)
GFR calc Af Amer: 41 mL/min — ABNORMAL LOW (ref >90)
GFR calc non Af Amer: 35 mL/min — ABNORMAL LOW (ref >90)
Glucose, Bld: 213 mg/dL — ABNORMAL HIGH (ref 70–99)
Potassium: 4.7 mmol/L (ref 3.5–5.1)
Sodium: 132 mmol/L — ABNORMAL LOW (ref 135–145)
Total Bilirubin: 1.1 mg/dL (ref 0.3–1.2)
Total Protein: 7.5 g/dL (ref 6.0–8.3)

## 2014-09-02 LAB — LACTIC ACID, PLASMA
LACTIC ACID, VENOUS: 1.9 mmol/L (ref 0.5–2.0)
Lactic Acid, Venous: 1.8 mmol/L (ref 0.5–2.0)

## 2014-09-02 LAB — CBC WITH DIFFERENTIAL/PLATELET
BASOS ABS: 0 10*3/uL (ref 0.0–0.1)
BASOS PCT: 0 % (ref 0–1)
EOS ABS: 0 10*3/uL (ref 0.0–0.7)
Eosinophils Relative: 0 % (ref 0–5)
HCT: 34.7 % — ABNORMAL LOW (ref 39.0–52.0)
Hemoglobin: 11.2 g/dL — ABNORMAL LOW (ref 13.0–17.0)
Lymphocytes Relative: 4 % — ABNORMAL LOW (ref 12–46)
Lymphs Abs: 0.4 10*3/uL — ABNORMAL LOW (ref 0.7–4.0)
MCH: 29.5 pg (ref 26.0–34.0)
MCHC: 32.3 g/dL (ref 30.0–36.0)
MCV: 91.3 fL (ref 78.0–100.0)
Monocytes Absolute: 0.8 10*3/uL (ref 0.1–1.0)
Monocytes Relative: 7 % (ref 3–12)
NEUTROS PCT: 89 % — AB (ref 43–77)
Neutro Abs: 9.9 10*3/uL — ABNORMAL HIGH (ref 1.7–7.7)
PLATELETS: 201 10*3/uL (ref 150–400)
RBC: 3.8 MIL/uL — ABNORMAL LOW (ref 4.22–5.81)
RDW: 14.1 % (ref 11.5–15.5)
WBC: 11.1 10*3/uL — ABNORMAL HIGH (ref 4.0–10.5)

## 2014-09-02 LAB — LIPASE, BLOOD: Lipase: 18 U/L (ref 11–59)

## 2014-09-02 MED ORDER — PIPERACILLIN-TAZOBACTAM 3.375 G IVPB
3.3750 g | Freq: Once | INTRAVENOUS | Status: AC
  Administered 2014-09-02: 3.375 g via INTRAVENOUS
  Filled 2014-09-02: qty 50

## 2014-09-02 MED ORDER — MORPHINE SULFATE 4 MG/ML IJ SOLN
4.0000 mg | Freq: Once | INTRAMUSCULAR | Status: AC
  Administered 2014-09-02: 4 mg via INTRAVENOUS

## 2014-09-02 MED ORDER — IOHEXOL 300 MG/ML  SOLN
50.0000 mL | Freq: Once | INTRAMUSCULAR | Status: AC | PRN
  Administered 2014-09-02: 50 mL via ORAL

## 2014-09-02 MED ORDER — METOPROLOL TARTRATE 25 MG PO TABS
25.0000 mg | ORAL_TABLET | Freq: Two times a day (BID) | ORAL | Status: DC
  Administered 2014-09-03 – 2014-09-05 (×6): 25 mg via ORAL
  Filled 2014-09-02 (×6): qty 1

## 2014-09-02 MED ORDER — SODIUM CHLORIDE 0.9 % IV SOLN
INTRAVENOUS | Status: DC
Start: 2014-09-02 — End: 2014-09-09
  Administered 2014-09-02 – 2014-09-09 (×14): via INTRAVENOUS

## 2014-09-02 MED ORDER — ENOXAPARIN SODIUM 40 MG/0.4ML ~~LOC~~ SOLN
40.0000 mg | SUBCUTANEOUS | Status: DC
  Administered 2014-09-03 – 2014-09-08 (×5): 40 mg via SUBCUTANEOUS
  Filled 2014-09-02 (×4): qty 0.4

## 2014-09-02 MED ORDER — MORPHINE SULFATE 4 MG/ML IJ SOLN
INTRAMUSCULAR | Status: AC
  Filled 2014-09-02: qty 1

## 2014-09-02 MED ORDER — ACETAMINOPHEN 325 MG PO TABS
650.0000 mg | ORAL_TABLET | Freq: Four times a day (QID) | ORAL | Status: DC | PRN
Start: 2014-09-02 — End: 2014-09-13
  Administered 2014-09-03 – 2014-09-12 (×3): 650 mg via ORAL
  Filled 2014-09-02 (×3): qty 2

## 2014-09-02 MED ORDER — INSULIN ASPART 100 UNIT/ML ~~LOC~~ SOLN
0.0000 [IU] | Freq: Three times a day (TID) | SUBCUTANEOUS | Status: DC
  Administered 2014-09-03: 3 [IU] via SUBCUTANEOUS
  Administered 2014-09-03 (×2): 1 [IU] via SUBCUTANEOUS
  Administered 2014-09-04 (×2): 3 [IU] via SUBCUTANEOUS
  Administered 2014-09-04: 2 [IU] via SUBCUTANEOUS
  Administered 2014-09-05: 1 [IU] via SUBCUTANEOUS
  Administered 2014-09-05: 2 [IU] via SUBCUTANEOUS
  Administered 2014-09-05 – 2014-09-06 (×2): 3 [IU] via SUBCUTANEOUS
  Administered 2014-09-06 (×2): 2 [IU] via SUBCUTANEOUS

## 2014-09-02 MED ORDER — ACETAMINOPHEN 650 MG RE SUPP: 650.0000 mg | Freq: Four times a day (QID) | RECTAL | Status: DC | PRN

## 2014-09-02 MED ORDER — ONDANSETRON HCL 4 MG PO TABS: 4.0000 mg | ORAL_TABLET | Freq: Four times a day (QID) | ORAL | Status: DC | PRN

## 2014-09-02 MED ORDER — PIPERACILLIN-TAZOBACTAM 3.375 G IVPB
3.3750 g | Freq: Once | INTRAVENOUS | Status: AC
  Administered 2014-09-03: 3.375 g via INTRAVENOUS
  Filled 2014-09-02: qty 50

## 2014-09-02 MED ORDER — IOHEXOL 300 MG/ML  SOLN
100.0000 mL | Freq: Once | INTRAMUSCULAR | Status: AC | PRN
  Administered 2014-09-02: 100 mL via INTRAVENOUS

## 2014-09-02 MED ORDER — ONDANSETRON HCL 4 MG/2ML IJ SOLN: 4.0000 mg | Freq: Four times a day (QID) | INTRAMUSCULAR | Status: DC | PRN

## 2014-09-02 MED ORDER — HYDROMORPHONE HCL 1 MG/ML IJ SOLN
1.0000 mg | INTRAMUSCULAR | Status: DC | PRN
  Administered 2014-09-05: 1 mg via INTRAVENOUS
  Filled 2014-09-02: qty 1

## 2014-09-02 MED ORDER — PRAVASTATIN SODIUM 40 MG PO TABS
40.0000 mg | ORAL_TABLET | Freq: Every day | ORAL | Status: DC
  Administered 2014-09-03 – 2014-09-13 (×9): 40 mg via ORAL
  Filled 2014-09-02 (×9): qty 1

## 2014-09-02 NOTE — ED Provider Notes (Signed)
CSN: [id number]     Arrival date & time 09/02/14  1848 History   First MD Initiated Contact with Patient 09/02/14 1852     Chief Complaint  Patient presents with  . Abdominal Pain     (Consider location/radiation/quality/duration/timing/severity/associated sxs/prior Treatment) HPI Comments: Patient complains of sharp periumbilical pain 2 weeks without associated vomiting or diarrhea. Denies any dysuria or hematuria. Denies any anginal type symptoms. Does endorse constipation and last bowel movement was a week ago. Came in today at the encouragement of his daughter. Symptoms persistent and nothing makes them better or worse. No treatment use prior to arrival  Patient is a 81 y.o. male presenting with abdominal pain. The history is provided by the patient.  Abdominal Pain   Past Medical History  Diagnosis Date  . Leg pain   . Diabetes mellitus   . Hypertension   . Hyperlipidemia   . Ulcer   . Peripheral vascular disease    Past Surgical History  Procedure Laterality Date  . Appendectomy    . Cataract extraction w/ intraocular lens  implant, bilateral    . Pr vein bypass graft,aorto-fem-pop      left above knee popliteal to posterior tibial artery bypass graft  . Eye surgery     Family History  Problem Relation Age of Onset  . Heart disease Mother   . Hypertension Mother   . Heart attack Mother   . Heart attack Father   . Heart disease Father     Heart disease before age 74  . Diabetes Other   . Cancer Other     prostate  . Cancer Sister   . Deep vein thrombosis Sister   . Diabetes Sister     amputation-right leg  . Cancer Daughter   . Diabetes Daughter   . Cancer Brother    History  Substance Use Topics  . Smoking status: Former Smoker    Types: Cigars    Quit date: 05/27/1988  . Smokeless tobacco: Former Neurosurgeon    Types: Chew    Quit date: 05/27/1988  . Alcohol Use: No    Review of Systems  Gastrointestinal: Positive for abdominal pain.  All other systems  reviewed and are negative.     Allergies  Review of patient's allergies indicates not on file.  Home Medications   Prior to Admission medications   Medication Sig Start Date End Date Taking? Authorizing Provider  ALPRAZolam Prudy Feeler) 0.5 MG tablet Take 0.5 mg by mouth 2 (two) times daily as needed.      Historical Provider, MD  amLODipine (NORVASC) 10 MG tablet Take 2 tabs daily (total of 20 mg)     Historical Provider, MD  amLODipine-benazepril (LOTREL) 5-20 MG per capsule  06/16/13   Historical Provider, MD  aspirin 325 MG tablet Take 325 mg by mouth daily.      Historical Provider, MD  cefUROXime (CEFTIN) 500 MG tablet Take 500 mg by mouth 2 (two) times daily.      Historical Provider, MD  metFORMIN (GLUCOPHAGE) 500 MG tablet Take 500 mg by mouth 2 (two) times daily with a meal.      Historical Provider, MD  metoprolol tartrate (LOPRESSOR) 25 MG tablet Take 25 mg by mouth 2 (two) times daily.      Historical Provider, MD  pravastatin (PRAVACHOL) 40 MG tablet Take 40 mg by mouth daily.      Historical Provider, MD   BP 124/63 mmHg  Pulse 95  Temp(Src) 101.1 F (38.4  C) (Oral)  Resp 20  Ht  (1.88 m)  Wt 210 lb (95.255 kg)  BMI 26.95 kg/m2  SpO2 96% Physical Exam  Constitutional: He is oriented to person, place, and time. He appears well-developed and well-nourished.  Non-toxic appearance. No distress.  HENT:  Head: Normocephalic and atraumatic.  Eyes: Conjunctivae, EOM and lids are normal. Pupils are equal, round, and reactive to light.  Neck: Normal range of motion. Neck supple. No tracheal deviation present. No thyroid mass present.  Cardiovascular: Normal rate, regular rhythm and normal heart sounds.  Exam reveals no gallop.   No murmur heard. Pulmonary/Chest: Effort normal and breath sounds normal. No stridor. No respiratory distress. He has no decreased breath sounds. He has no wheezes. He has no rhonchi. He has no rales.  Abdominal: Soft. Normal appearance and bowel  sounds are normal. He exhibits no distension. There is no tenderness. There is no rigidity, no rebound, no guarding and no CVA tenderness.    Umbilical hernia noted without evidence of skin changes. No signs of peritonitis  Musculoskeletal: Normal range of motion. He exhibits no edema or tenderness.  Neurological: He is alert and oriented to person, place, and time. He has normal strength. No cranial nerve deficit or sensory deficit. GCS eye subscore is 4. GCS verbal subscore is 5. GCS motor subscore is 6.  Skin: Skin is warm and dry. No abrasion and no rash noted.  Psychiatric: He has a normal mood and affect. His speech is normal and behavior is normal.  Nursing note and vitals reviewed.   ED Course  Procedures (including critical care time) Labs Review Labs Reviewed  CBC WITH DIFFERENTIAL/PLATELET - Abnormal; Notable for the following:    WBC 11.1 (*)    RBC 3.80 (*)    Hemoglobin 11.2 (*)    HCT 34.7 (*)    Neutrophils Relative % 89 (*)    Neutro Abs 9.9 (*)    Lymphocytes Relative 4 (*)    Lymphs Abs 0.4 (*)    All other components within normal limits  URINE CULTURE  URINALYSIS, ROUTINE W REFLEX MICROSCOPIC  COMPREHENSIVE METABOLIC PANEL  LIPASE, BLOOD  I-STAT CG4 LACTIC ACID, ED    Imaging Review No results found.   EKG Interpretation None      MDM   Final diagnoses:  Abdominal pain    Patient started on IV antibiotics for his hepatic abscess. Spoke with general surgery, Dr. Lovell Sheehan, recommends medicine admission and he will follow the patient along the hospital    Lorre Nick, MD 09/02/14 2212

## 2014-09-02 NOTE — H&P (Signed)
PCP:   Fredirick Maudlin, MD   Chief Complaint:  Abdominal pain  HPI: 81 year old male who   has a past medical history of Leg pain; Diabetes mellitus; Hypertension; Hyperlipidemia; Ulcer; and Peripheral vascular disease. Today presents to the ED with chief complaint of abdominal pain for past 2 weeks. Associated with nausea, with poor appetite. He denies vomiting or diarrhea. No dysuria urgency frequency of urination. No fever. Patient says that the pain is located in the peri umbilical region. He denies chest pain, no shortness of breath. In the ED CT scan of the abdomen was done which showed gallbladder distention with cholelithiasis and gallbladder wall thickening which may indicate acute cholecystitis.Heterogeneous hypo attenuating septated lesion demonstrated in the medial segment left lobe of the liver adjacent to the gallbladder. Appearance is most likely to represent hepatic abscess although differential diagnosis would include infiltrating gallbladder neoplasm or metastasis. Gen. surgery was consulted by the ED physician/  Allergies:  No Known Allergies    Past Medical History  Diagnosis Date  . Leg pain   . Diabetes mellitus   . Hypertension   . Hyperlipidemia   . Ulcer   . Peripheral vascular disease     Past Surgical History  Procedure Laterality Date  . Appendectomy    . Cataract extraction w/ intraocular lens  implant, bilateral    . Pr vein bypass graft,aorto-fem-pop      left above knee popliteal to posterior tibial artery bypass graft  . Eye surgery      Prior to Admission medications   Medication Sig Start Date End Date Taking? Authorizing Provider  aspirin 325 MG tablet Take 325 mg by mouth daily.     Yes Historical Provider, MD  metFORMIN (GLUCOPHAGE) 500 MG tablet Take 500 mg by mouth 2 (two) times daily with a meal.     Yes Historical Provider, MD  metoprolol tartrate (LOPRESSOR) 25 MG tablet Take 25 mg by mouth 2 (two) times daily.     Yes  Historical Provider, MD  pravastatin (PRAVACHOL) 40 MG tablet Take 40 mg by mouth daily.     Yes Historical Provider, MD    Social History:  reports that he quit smoking about 26 years ago. His smoking use included Cigars. He quit smokeless tobacco use about 26 years ago. His smokeless tobacco use included Chew. He reports that he does not drink alcohol or use illicit drugs.  Family History  Problem Relation Age of Onset  . Heart disease Mother   . Hypertension Mother   . Heart attack Mother   . Heart attack Father   . Heart disease Father     Heart disease before age 78  . Diabetes Other   . Cancer Other     prostate  . Cancer Sister   . Deep vein thrombosis Sister   . Diabetes Sister     amputation-right leg  . Cancer Daughter   . Diabetes Daughter   . Cancer Brother      All the positives are listed in BOLD  Review of Systems:  HEENT: Headache, blurred vision, runny nose, sore throat Neck: Hypothyroidism, hyperthyroidism,,lymphadenopathy Chest : Shortness of breath, history of COPD, Asthma Heart : Chest pain, history of coronary arterey disease GI:  Nausea, vomiting, diarrhea, constipation, GERD GU: Dysuria, urgency, frequency of urination, hematuria Neuro: Stroke, seizures, syncope Psych: Depression, anxiety, hallucinations   Physical Exam: Blood pressure 100/67, pulse 108, temperature 100.5 F (38.1 C), temperature source Oral, resp. rate 20, height  (  1.88 m), weight 90.629 kg (199 lb 12.8 oz), SpO2 98 %. Constitutional:   Patient is a well-developed and well-nourished male* in no acute distress and cooperative with exam. Head: Normocephalic and atraumatic Mouth: Mucus membranes moist Eyes: PERRL, EOMI, conjunctivae normal Neck: Supple, No Thyromegaly Cardiovascular: RRR, S1 normal, S2 normal Pulmonary/Chest: CTAB, no wheezes, rales, or rhonchi Abdominal: Soft. Positive tenderness to palpation in the peri-medical region , non-distended, bowel sounds are  normal, no masses, organomegaly, or guarding present.  Neurological: A&O x3, Strength is normal and symmetric bilaterally, cranial nerve II-XII are grossly intact, no focal motor deficit, sensory intact to light touch bilaterally.  Extremities : No Cyanosis, Clubbing or Edema  Labs on Admission:  Basic Metabolic Panel:  Recent Labs Lab 09/02/14 1905  NA 132*  K 4.7  CL 99  CO2 23  GLUCOSE 213*  BUN 25*  CREATININE 1.72*  CALCIUM 8.9   Liver Function Tests:  Recent Labs Lab 09/02/14 1905  AST 51*  ALT 31  ALKPHOS 91  BILITOT 1.1  PROT 7.5  ALBUMIN 2.9*    Recent Labs Lab 09/02/14 1905  LIPASE 18   No results for input(s): AMMONIA in the last 168 hours. CBC:  Recent Labs Lab 09/02/14 1905  WBC 11.1*  NEUTROABS 9.9*  HGB 11.2*  HCT 34.7*  MCV 91.3  PLT 201    Radiological Exams on Admission: Ct Abdomen Pelvis W Contrast  09/02/2014   CLINICAL DATA:  Sharp pain just above the umbilicus for 2 weeks. Constipation with no bowel movement for 1 week. Periumbilical abdominal pain.  EXAM: CT ABDOMEN AND PELVIS WITH CONTRAST  TECHNIQUE: Multidetector CT imaging of the abdomen and pelvis was performed using the standard protocol following bolus administration of intravenous contrast.  CONTRAST:  50mL OMNIPAQUE IOHEXOL 300 MG/ML SOLN, OMNIPAQUE IOHEXOL 300 MG/ML SOLN  COMPARISON:  08/05/2008  FINDINGS: Mild dependent atelectasis or fibrosis in the lung bases. Mild bronchiectasis. Coronary artery calcifications.  Cholelithiasis with distended gallbladder and mild gallbladder wall thickening. This may indicate acute cholecystitis. Since the previous study, there is interval development of a heterogeneous hypo attenuating collection in the medial segment left lobe of liver measuring about 3 x 3.3 cm diameter. This is adjacent to the gallbladder. Differential diagnosis includes hepatic abscess, infiltrating gallbladder neoplasm, or metastasis. Suggest MRI for further  characterization. No other focal liver lesions demonstrated. No bile duct dilatation. The pancreas, spleen, adrenal glands, inferior vena cava, and retroperitoneal lymph nodes are unremarkable. Calcific and noncalcific atherosclerotic changes in the abdominal aorta without aneurysm. Multiple cysts in the kidneys. No hydronephrosis. Stomach, small bowel, and colon are not abnormally distended. No free air or free fluid in the abdomen.  Pelvis: Bladder wall is not thickened. Diffuse enlargement of the prostate gland at 5.4 x 7.2 cm. No free or loculated pelvic fluid collections. No pelvic lymphadenopathy. Appendix is surgically absent. No evidence of diverticulitis. Degenerative changes in the spine. No destructive bone lesions.  IMPRESSION: Gallbladder distention with cholelithiasis and gallbladder wall thickening may indicate acute cholecystitis. Heterogeneous hypo attenuating septated lesion demonstrated in the medial segment left lobe of the liver adjacent to the gallbladder. Appearance is most likely to represent hepatic abscess although differential diagnosis would include infiltrating gallbladder neoplasm or metastasis. Suggest MRI for further evaluation. Diffuse enlargement of prostate gland. No bowel obstruction.   Electronically Signed   By: Burman Nieves M.D.   On: 09/02/2014 21:31       Assessment/Plan Active Problems:   Hepatic abscess  Abdominal pain   DM (diabetes mellitus)  Abdominal pain CT scan shows thickening of the gallbladder wall with possible cholecystitis and possible hepatic abscess. We will start the patient on Zosyn per pharmacy. Patient has a MAXIMUM TEMPERATURE of 101.1. Gen. surgery has been consulted by the ED physician. Dr. Lovell Sheehan to see the patient in a.m.  Diabetes mellitus We'll start the patient on sliding-scale insulin with NovoLog.  Hypertension Continue metoprolol 25 mg twice a day  Hyperlipidemia Continue Pravachol.  DVT prophylaxis Lovenox  Code  status: Patient is full code  Family discussion: Admission, patients condition and plan of care including tests being ordered have been discussed with the patient and *his daughter at bedside* who indicate understanding and agree with the plan and Code Status.   Time Spent on Admission: 60 min  Anjel Pardo S [facility] Pager: [phone] 09/02/2014, 11:04 PM  If 7PM-7AM, please contact night-coverage  [email or web address]  Password [email or web address]

## 2014-09-02 NOTE — ED Notes (Signed)
Sharp pain just above umbilicus x 2 weeks. Denies n/v/d. Has had some constipation.  Last normal BM 1 week ago.

## 2014-09-02 NOTE — Progress Notes (Signed)
ANTIBIOTIC CONSULT NOTE-Preliminary  Pharmacy Consult for Zosyn Indication: Intra-abdominal infection  No Known Allergies  Patient Measurements: Height: 6\' 2"  (188 cm) Weight: 199 lb 12.8 oz (90.629 kg) IBW/kg (Calculated) : 82.2  Vital Signs: Temp: 100.5 F (38.1 C) (04/08 2256) Temp Source: Oral (04/08 2256) BP: 100/67 mmHg (04/08 2256) Pulse Rate: 108 (04/08 2256)  Labs:  Recent Labs  09/02/14 1905  WBC 11.1*  HGB 11.2*  PLT 201  CREATININE 1.72*    Estimated Creatinine Clearance: 38.5 mL/min (by C-G formula based on Cr of 1.72).  No results for input(s): VANCOTROUGH, VANCOPEAK, VANCORANDOM, GENTTROUGH, GENTPEAK, GENTRANDOM, TOBRATROUGH, TOBRAPEAK, TOBRARND, AMIKACINPEAK, AMIKACINTROU, AMIKACIN in the last 72 hours.   Microbiology: No results found for this or any previous visit (from the past 720 hour(s)).  Medical History: Past Medical History  Diagnosis Date  . Leg pain   . Diabetes mellitus   . Hypertension   . Hyperlipidemia   . Ulcer   . Peripheral vascular disease     Medications:  Zosyn 3.375 Gm IV x 1 dose given in the Ed at 2145 09/02/2014  Assessment: 81 yo male with abdominal pain, nausea, increased temperature. CT showed gallbladder distension with cholelithiasis, liver lesion; possible abscess. Surgical consult pending.  Goal of Therapy:  Eradicate infection  Plan:  Preliminary review of pertinent patient information completed.  Protocol will be initiated with a one-time dose of Zosyn 3.375 Gm IV 8 hours after the initial dose given in the ED.  Jeani Hawking clinical pharmacist will complete review during morning rounds to assess patient and finalize treatment regimen.  Arelia Sneddon, [facility] 09/02/2014,11:30 PM

## 2014-09-03 ENCOUNTER — Inpatient Hospital Stay (HOSPITAL_COMMUNITY): Payer: Medicare Other

## 2014-09-03 LAB — GLUCOSE, CAPILLARY
Glucose-Capillary: 143 mg/dL — ABNORMAL HIGH (ref 70–99)
Glucose-Capillary: 132 mg/dL — ABNORMAL HIGH (ref 70–99)
Glucose-Capillary: 248 mg/dL — ABNORMAL HIGH (ref 70–99)
Glucose-Capillary: 193 mg/dL — ABNORMAL HIGH (ref 70–99)

## 2014-09-03 LAB — CBC
HEMATOCRIT: 31.4 % — AB (ref 39.0–52.0)
Hemoglobin: 10.2 g/dL — ABNORMAL LOW (ref 13.0–17.0)
MCH: 29.6 pg (ref 26.0–34.0)
MCHC: 32.5 g/dL (ref 30.0–36.0)
MCV: 91 fL (ref 78.0–100.0)
Platelets: 202 10*3/uL (ref 150–400)
RBC: 3.45 MIL/uL — AB (ref 4.22–5.81)
RDW: 14.3 % (ref 11.5–15.5)
WBC: 10.5 10*3/uL (ref 4.0–10.5)

## 2014-09-03 LAB — URINALYSIS, ROUTINE W REFLEX MICROSCOPIC
BILIRUBIN URINE: NEGATIVE
Glucose, UA: NEGATIVE mg/dL
Leukocytes, UA: NEGATIVE
NITRITE: NEGATIVE
PH: 5 (ref 5.0–8.0)
PROTEIN: 100 mg/dL — AB
Specific Gravity, Urine: 1.02 (ref 1.005–1.030)
Urobilinogen, UA: 1 mg/dL (ref 0.0–1.0)

## 2014-09-03 LAB — COMPREHENSIVE METABOLIC PANEL
ALBUMIN: 2.6 g/dL — AB (ref 3.5–5.2)
ALT: 42 U/L (ref 0–53)
ANION GAP: 7 (ref 5–15)
AST: 69 U/L — ABNORMAL HIGH (ref 0–37)
Alkaline Phosphatase: 86 U/L (ref 39–117)
BILIRUBIN TOTAL: 1.2 mg/dL (ref 0.3–1.2)
BUN: 22 mg/dL (ref 6–23)
CO2: 24 mmol/L (ref 19–32)
Calcium: 8.3 mg/dL — ABNORMAL LOW (ref 8.4–10.5)
Chloride: 102 mmol/L (ref 96–112)
Creatinine, Ser: 1.46 mg/dL — ABNORMAL HIGH (ref 0.50–1.35)
GFR calc Af Amer: 50 mL/min — ABNORMAL LOW (ref >90)
GFR calc non Af Amer: 43 mL/min — ABNORMAL LOW (ref >90)
Glucose, Bld: 147 mg/dL — ABNORMAL HIGH (ref 70–99)
Potassium: 4.1 mmol/L (ref 3.5–5.1)
Sodium: 133 mmol/L — ABNORMAL LOW (ref 135–145)
Total Protein: 6.6 g/dL (ref 6.0–8.3)

## 2014-09-03 LAB — URINE MICROSCOPIC-ADD ON

## 2014-09-03 LAB — SURGICAL PCR SCREEN
MRSA, PCR: NEGATIVE
STAPHYLOCOCCUS AUREUS: NEGATIVE

## 2014-09-03 MED ORDER — PIPERACILLIN-TAZOBACTAM 3.375 G IVPB
3.3750 g | Freq: Three times a day (TID) | INTRAVENOUS | Status: DC
  Administered 2014-09-03 – 2014-09-13 (×30): 3.375 g via INTRAVENOUS
  Filled 2014-09-03 (×35): qty 50

## 2014-09-03 MED ORDER — PIPERACILLIN-TAZOBACTAM 3.375 G IVPB
INTRAVENOUS | Status: AC
  Filled 2014-09-03: qty 50

## 2014-09-03 NOTE — Progress Notes (Signed)
Subjective: Patient was admitted  Yesterday due to abdominal pain. Ct of the abdomen showed distended gall bladder with stone and ?liver abscess. Surgical consult is pending.  Objective: Vital signs in last 24 hours: Temp:  [98.7 F (37.1 C)-101.1 F (38.4 C)] 99.7 F (37.6 C) (04/09 0555) Pulse Rate:  [84-108] 84 (04/09 0555) Resp:  [20] 20 (04/09 0555) BP: (100-151)/(55-78) 146/55 mmHg (04/09 0555) SpO2:  [92 %-98 %] 97 % (04/09 0555) Weight:  [90.629 kg (199 lb 12.8 oz)-95.255 kg (210 lb)] 90.629 kg (199 lb 12.8 oz) (04/08 2256) Weight change:  Last BM Date: 08/31/14  Intake/Output from previous day: 04/08 0701 - 04/09 0700 In: 1370.8 [I.V.:1320.8; IV Piggyback:50] Out: -   PHYSICAL EXAM General appearance: alert and no distress Resp: clear to auscultation bilaterally Cardio: S1, S2 normal GI: generalized  abdominal tendernss, no guarding, bowel sound ++ Extremities: extremities normal, atraumatic, no cyanosis or edema  Lab Results:  Results for orders placed or performed during the hospital encounter of 09/02/14 (from the past 48 hour(s))  CBC with Differential/Platelet     Status: Abnormal   Collection Time: 09/02/14  7:05 PM  Result Value Ref Range   WBC 11.1 (H) 4.0 - 10.5 K/uL   RBC 3.80 (L) 4.22 - 5.81 MIL/uL   Hemoglobin 11.2 (L) 13.0 - 17.0 g/dL   HCT 34.7 (L) 39.0 - 52.0 %   MCV 91.3 78.0 - 100.0 fL   MCH 29.5 26.0 - 34.0 pg   MCHC 32.3 30.0 - 36.0 g/dL   RDW 14.1 11.5 - 15.5 %   Platelets 201 150 - 400 K/uL   Neutrophils Relative % 89 (H) 43 - 77 %   Neutro Abs 9.9 (H) 1.7 - 7.7 K/uL   Lymphocytes Relative 4 (L) 12 - 46 %   Lymphs Abs 0.4 (L) 0.7 - 4.0 K/uL   Monocytes Relative 7 3 - 12 %   Monocytes Absolute 0.8 0.1 - 1.0 K/uL   Eosinophils Relative 0 0 - 5 %   Eosinophils Absolute 0.0 0.0 - 0.7 K/uL   Basophils Relative 0 0 - 1 %   Basophils Absolute 0.0 0.0 - 0.1 K/uL  Comprehensive metabolic panel     Status: Abnormal   Collection Time: 09/02/14   7:05 PM  Result Value Ref Range   Sodium 132 (L) 135 - 145 mmol/L   Potassium 4.7 3.5 - 5.1 mmol/L   Chloride 99 96 - 112 mmol/L   CO2 23 19 - 32 mmol/L   Glucose, Bld 213 (H) 70 - 99 mg/dL   BUN 25 (H) 6 - 23 mg/dL   Creatinine, Ser 1.72 (H) 0.50 - 1.35 mg/dL   Calcium 8.9 8.4 - 10.5 mg/dL   Total Protein 7.5 6.0 - 8.3 g/dL   Albumin 2.9 (L) 3.5 - 5.2 g/dL   AST 51 (H) 0 - 37 U/L   ALT 31 0 - 53 U/L   Alkaline Phosphatase 91 39 - 117 U/L   Total Bilirubin 1.1 0.3 - 1.2 mg/dL   GFR calc non Af Amer 35 (L) >90 mL/min   GFR calc Af Amer 41 (L) >90 mL/min    Comment: (NOTE) The eGFR has been calculated using the CKD EPI equation. This calculation has not been validated in all clinical situations. eGFR's persistently <90 mL/min signify possible Chronic Kidney Disease.    Anion gap 10 5 - 15  Lipase, blood     Status: None   Collection Time: 09/02/14  7:05  PM  Result Value Ref Range   Lipase 18 11 - 59 U/L  Lactic acid, plasma     Status: None   Collection Time: 09/02/14  7:55 PM  Result Value Ref Range   Lactic Acid, Venous 1.9 0.5 - 2.0 mmol/L  Lactic acid, plasma     Status: None   Collection Time: 09/02/14 10:39 PM  Result Value Ref Range   Lactic Acid, Venous 1.8 0.5 - 2.0 mmol/L  CBC     Status: Abnormal   Collection Time: 09/03/14  6:26 AM  Result Value Ref Range   WBC 10.5 4.0 - 10.5 K/uL   RBC 3.45 (L) 4.22 - 5.81 MIL/uL   Hemoglobin 10.2 (L) 13.0 - 17.0 g/dL   HCT 31.4 (L) 39.0 - 52.0 %   MCV 91.0 78.0 - 100.0 fL   MCH 29.6 26.0 - 34.0 pg   MCHC 32.5 30.0 - 36.0 g/dL   RDW 14.3 11.5 - 15.5 %   Platelets 202 150 - 400 K/uL  Comprehensive metabolic panel     Status: Abnormal   Collection Time: 09/03/14  6:26 AM  Result Value Ref Range   Sodium 133 (L) 135 - 145 mmol/L   Potassium 4.1 3.5 - 5.1 mmol/L   Chloride 102 96 - 112 mmol/L   CO2 24 19 - 32 mmol/L   Glucose, Bld 147 (H) 70 - 99 mg/dL   BUN 22 6 - 23 mg/dL   Creatinine, Ser 1.46 (H) 0.50 - 1.35  mg/dL   Calcium 8.3 (L) 8.4 - 10.5 mg/dL   Total Protein 6.6 6.0 - 8.3 g/dL   Albumin 2.6 (L) 3.5 - 5.2 g/dL   AST 69 (H) 0 - 37 U/L   ALT 42 0 - 53 U/L   Alkaline Phosphatase 86 39 - 117 U/L   Total Bilirubin 1.2 0.3 - 1.2 mg/dL   GFR calc non Af Amer 43 (L) >90 mL/min   GFR calc Af Amer 50 (L) >90 mL/min    Comment: (NOTE) The eGFR has been calculated using the CKD EPI equation. This calculation has not been validated in all clinical situations. eGFR's persistently <90 mL/min signify possible Chronic Kidney Disease.    Anion gap 7 5 - 15  Glucose, capillary     Status: Abnormal   Collection Time: 09/03/14  7:39 AM  Result Value Ref Range   Glucose-Capillary 143 (H) 70 - 99 mg/dL   Comment 1 Notify RN     ABGS No results for input(s): PHART, PO2ART, TCO2, HCO3 in the last 72 hours.  Invalid input(s): PCO2 CULTURES No results found for this or any previous visit (from the past 240 hour(s)). Studies/Results: Ct Abdomen Pelvis W Contrast  09/02/2014   CLINICAL DATA:  Sharp pain just above the umbilicus for 2 weeks. Constipation with no bowel movement for 1 week. Periumbilical abdominal pain.  EXAM: CT ABDOMEN AND PELVIS WITH CONTRAST  TECHNIQUE: Multidetector CT imaging of the abdomen and pelvis was performed using the standard protocol following bolus administration of intravenous contrast.  CONTRAST:  76m OMNIPAQUE IOHEXOL 300 MG/ML SOLN, 1034mOMNIPAQUE IOHEXOL 300 MG/ML SOLN  COMPARISON:  08/05/2008  FINDINGS: Mild dependent atelectasis or fibrosis in the lung bases. Mild bronchiectasis. Coronary artery calcifications.  Cholelithiasis with distended gallbladder and mild gallbladder wall thickening. This may indicate acute cholecystitis. Since the previous study, there is interval development of a heterogeneous hypo attenuating collection in the medial segment left lobe of liver measuring about 3 x 3.3  cm diameter. This is adjacent to the gallbladder. Differential diagnosis includes  hepatic abscess, infiltrating gallbladder neoplasm, or metastasis. Suggest MRI for further characterization. No other focal liver lesions demonstrated. No bile duct dilatation. The pancreas, spleen, adrenal glands, inferior vena cava, and retroperitoneal lymph nodes are unremarkable. Calcific and noncalcific atherosclerotic changes in the abdominal aorta without aneurysm. Multiple cysts in the kidneys. No hydronephrosis. Stomach, small bowel, and colon are not abnormally distended. No free air or free fluid in the abdomen.  Pelvis: Bladder wall is not thickened. Diffuse enlargement of the prostate gland at 5.4 x 7.2 cm. No free or loculated pelvic fluid collections. No pelvic lymphadenopathy. Appendix is surgically absent. No evidence of diverticulitis. Degenerative changes in the spine. No destructive bone lesions.  IMPRESSION: Gallbladder distention with cholelithiasis and gallbladder wall thickening may indicate acute cholecystitis. Heterogeneous hypo attenuating septated lesion demonstrated in the medial segment left lobe of the liver adjacent to the gallbladder. Appearance is most likely to represent hepatic abscess although differential diagnosis would include infiltrating gallbladder neoplasm or metastasis. Suggest MRI for further evaluation. Diffuse enlargement of prostate gland. No bowel obstruction.   Electronically Signed   By: Lucienne Capers M.D.   On: 09/02/2014 21:31    Medications: I have reviewed the patient's current medications.  Assesment:   Active Problems:   Hepatic abscess   Abdominal pain   DM (diabetes mellitus) cholecystitis cholelithiasis   Plan:  Medications reviewed Continue IV Zosyn Will keep NPO Surgical consult    LOS: 1 day   Cailah Reach 09/03/2014, 9:53 AM

## 2014-09-03 NOTE — Consult Note (Signed)
Reason for Consult: Cholecystitis with possible liver abscess Referring Physician: Dr. Carney Living is an 81 y.o. male.  HPI: Patient is an 81 year old black male multiple medical problems including peripheral vascular disease, hypertension, diabetes mellitus who presents with a several week history of worsening nonspecific abdominal pain, nausea, and vomiting. CT scan of the abdomen reveals cholecystitis, cholelithiasis, and a possible pericholecystic liver abscess. Today, he states he does not have abdominal pain present time. He did have an open appendectomy for perforated appendicitis in 2010. He denies any recent fever, chills, or jaundice. He does have fatty food intolerance.  Past Medical History  Diagnosis Date  . Leg pain   . Diabetes mellitus   . Hypertension   . Hyperlipidemia   . Ulcer   . Peripheral vascular disease     Past Surgical History  Procedure Laterality Date  . Appendectomy    . Cataract extraction w/ intraocular lens  implant, bilateral    . Pr vein bypass graft,aorto-fem-pop      left above knee popliteal to posterior tibial artery bypass graft  . Eye surgery      Family History  Problem Relation Age of Onset  . Heart disease Mother   . Hypertension Mother   . Heart attack Mother   . Heart attack Father   . Heart disease Father     Heart disease before age 57  . Diabetes Other   . Cancer Other     prostate  . Cancer Sister   . Deep vein thrombosis Sister   . Diabetes Sister     amputation-right leg  . Cancer Daughter   . Diabetes Daughter   . Cancer Brother     Social History:  reports that he quit smoking about 26 years ago. His smoking use included Cigars. He quit smokeless tobacco use about 26 years ago. His smokeless tobacco use included Chew. He reports that he does not drink alcohol or use illicit drugs.  Allergies: No Known Allergies  Medications: I have reviewed the patient's current medications.  Results for orders  placed or performed during the hospital encounter of 09/02/14 (from the past 48 hour(s))  CBC with Differential/Platelet     Status: Abnormal   Collection Time: 09/02/14  7:05 PM  Result Value Ref Range   WBC 11.1 (H) 4.0 - 10.5 K/uL   RBC 3.80 (L) 4.22 - 5.81 MIL/uL   Hemoglobin 11.2 (L) 13.0 - 17.0 g/dL   HCT 34.7 (L) 39.0 - 52.0 %   MCV 91.3 78.0 - 100.0 fL   MCH 29.5 26.0 - 34.0 pg   MCHC 32.3 30.0 - 36.0 g/dL   RDW 14.1 11.5 - 15.5 %   Platelets 201 150 - 400 K/uL   Neutrophils Relative % 89 (H) 43 - 77 %   Neutro Abs 9.9 (H) 1.7 - 7.7 K/uL   Lymphocytes Relative 4 (L) 12 - 46 %   Lymphs Abs 0.4 (L) 0.7 - 4.0 K/uL   Monocytes Relative 7 3 - 12 %   Monocytes Absolute 0.8 0.1 - 1.0 K/uL   Eosinophils Relative 0 0 - 5 %   Eosinophils Absolute 0.0 0.0 - 0.7 K/uL   Basophils Relative 0 0 - 1 %   Basophils Absolute 0.0 0.0 - 0.1 K/uL  Comprehensive metabolic panel     Status: Abnormal   Collection Time: 09/02/14  7:05 PM  Result Value Ref Range   Sodium 132 (L) 135 - 145 mmol/L  Potassium 4.7 3.5 - 5.1 mmol/L   Chloride 99 96 - 112 mmol/L   CO2 23 19 - 32 mmol/L   Glucose, Bld 213 (H) 70 - 99 mg/dL   BUN 25 (H) 6 - 23 mg/dL   Creatinine, Ser 1.72 (H) 0.50 - 1.35 mg/dL   Calcium 8.9 8.4 - 10.5 mg/dL   Total Protein 7.5 6.0 - 8.3 g/dL   Albumin 2.9 (L) 3.5 - 5.2 g/dL   AST 51 (H) 0 - 37 U/L   ALT 31 0 - 53 U/L   Alkaline Phosphatase 91 39 - 117 U/L   Total Bilirubin 1.1 0.3 - 1.2 mg/dL   GFR calc non Af Amer 35 (L) >90 mL/min   GFR calc Af Amer 41 (L) >90 mL/min    Comment: (NOTE) The eGFR has been calculated using the CKD EPI equation. This calculation has not been validated in all clinical situations. eGFR's persistently <90 mL/min signify possible Chronic Kidney Disease.    Anion gap 10 5 - 15  Lipase, blood     Status: None   Collection Time: 09/02/14  7:05 PM  Result Value Ref Range   Lipase 18 11 - 59 U/L  Lactic acid, plasma     Status: None   Collection  Time: 09/02/14  7:55 PM  Result Value Ref Range   Lactic Acid, Venous 1.9 0.5 - 2.0 mmol/L  Lactic acid, plasma     Status: None   Collection Time: 09/02/14 10:39 PM  Result Value Ref Range   Lactic Acid, Venous 1.8 0.5 - 2.0 mmol/L  CBC     Status: Abnormal   Collection Time: 09/03/14  6:26 AM  Result Value Ref Range   WBC 10.5 4.0 - 10.5 K/uL   RBC 3.45 (L) 4.22 - 5.81 MIL/uL   Hemoglobin 10.2 (L) 13.0 - 17.0 g/dL   HCT 31.4 (L) 39.0 - 52.0 %   MCV 91.0 78.0 - 100.0 fL   MCH 29.6 26.0 - 34.0 pg   MCHC 32.5 30.0 - 36.0 g/dL   RDW 14.3 11.5 - 15.5 %   Platelets 202 150 - 400 K/uL  Comprehensive metabolic panel     Status: Abnormal   Collection Time: 09/03/14  6:26 AM  Result Value Ref Range   Sodium 133 (L) 135 - 145 mmol/L   Potassium 4.1 3.5 - 5.1 mmol/L   Chloride 102 96 - 112 mmol/L   CO2 24 19 - 32 mmol/L   Glucose, Bld 147 (H) 70 - 99 mg/dL   BUN 22 6 - 23 mg/dL   Creatinine, Ser 1.46 (H) 0.50 - 1.35 mg/dL   Calcium 8.3 (L) 8.4 - 10.5 mg/dL   Total Protein 6.6 6.0 - 8.3 g/dL   Albumin 2.6 (L) 3.5 - 5.2 g/dL   AST 69 (H) 0 - 37 U/L   ALT 42 0 - 53 U/L   Alkaline Phosphatase 86 39 - 117 U/L   Total Bilirubin 1.2 0.3 - 1.2 mg/dL   GFR calc non Af Amer 43 (L) >90 mL/min   GFR calc Af Amer 50 (L) >90 mL/min    Comment: (NOTE) The eGFR has been calculated using the CKD EPI equation. This calculation has not been validated in all clinical situations. eGFR's persistently <90 mL/min signify possible Chronic Kidney Disease.    Anion gap 7 5 - 15  Glucose, capillary     Status: Abnormal   Collection Time: 09/03/14  7:39 AM  Result Value Ref Range  Glucose-Capillary 143 (H) 70 - 99 mg/dL   Comment 1 Notify RN   Urinalysis, Routine w reflex microscopic     Status: Abnormal   Collection Time: 09/03/14  9:30 AM  Result Value Ref Range   Color, Urine YELLOW YELLOW   APPearance HAZY (A) CLEAR   Specific Gravity, Urine 1.020 1.005 - 1.030   pH 5.0 5.0 - 8.0   Glucose, UA  NEGATIVE NEGATIVE mg/dL   Hgb urine dipstick LARGE (A) NEGATIVE   Bilirubin Urine NEGATIVE NEGATIVE   Ketones, ur TRACE (A) NEGATIVE mg/dL   Protein, ur 100 (A) NEGATIVE mg/dL   Urobilinogen, UA 1.0 0.0 - 1.0 mg/dL   Nitrite NEGATIVE NEGATIVE   Leukocytes, UA NEGATIVE NEGATIVE  Urine microscopic-add on     Status: Abnormal   Collection Time: 09/03/14  9:30 AM  Result Value Ref Range   WBC, UA 0-2 <3 WBC/hpf   RBC / HPF 3-6 <3 RBC/hpf   Bacteria, UA FEW (A) RARE   Casts GRANULAR CAST (A) NEGATIVE    Ct Abdomen Pelvis W Contrast  09/02/2014   CLINICAL DATA:  Sharp pain just above the umbilicus for 2 weeks. Constipation with no bowel movement for 1 week. Periumbilical abdominal pain.  EXAM: CT ABDOMEN AND PELVIS WITH CONTRAST  TECHNIQUE: Multidetector CT imaging of the abdomen and pelvis was performed using the standard protocol following bolus administration of intravenous contrast.  CONTRAST:  70mL OMNIPAQUE IOHEXOL 300 MG/ML SOLN, 139mL OMNIPAQUE IOHEXOL 300 MG/ML SOLN  COMPARISON:  08/05/2008  FINDINGS: Mild dependent atelectasis or fibrosis in the lung bases. Mild bronchiectasis. Coronary artery calcifications.  Cholelithiasis with distended gallbladder and mild gallbladder wall thickening. This may indicate acute cholecystitis. Since the previous study, there is interval development of a heterogeneous hypo attenuating collection in the medial segment left lobe of liver measuring about 3 x 3.3 cm diameter. This is adjacent to the gallbladder. Differential diagnosis includes hepatic abscess, infiltrating gallbladder neoplasm, or metastasis. Suggest MRI for further characterization. No other focal liver lesions demonstrated. No bile duct dilatation. The pancreas, spleen, adrenal glands, inferior vena cava, and retroperitoneal lymph nodes are unremarkable. Calcific and noncalcific atherosclerotic changes in the abdominal aorta without aneurysm. Multiple cysts in the kidneys. No hydronephrosis.  Stomach, small bowel, and colon are not abnormally distended. No free air or free fluid in the abdomen.  Pelvis: Bladder wall is not thickened. Diffuse enlargement of the prostate gland at 5.4 x 7.2 cm. No free or loculated pelvic fluid collections. No pelvic lymphadenopathy. Appendix is surgically absent. No evidence of diverticulitis. Degenerative changes in the spine. No destructive bone lesions.  IMPRESSION: Gallbladder distention with cholelithiasis and gallbladder wall thickening may indicate acute cholecystitis. Heterogeneous hypo attenuating septated lesion demonstrated in the medial segment left lobe of the liver adjacent to the gallbladder. Appearance is most likely to represent hepatic abscess although differential diagnosis would include infiltrating gallbladder neoplasm or metastasis. Suggest MRI for further evaluation. Diffuse enlargement of prostate gland. No bowel obstruction.   Electronically Signed   By: Lucienne Capers M.D.   On: 09/02/2014 21:31    ROS: See chart Blood pressure 146/55, pulse 84, temperature 99.7 F (37.6 C), temperature source Oral, resp. rate 20, height $RemoveBe'6\' 2"'[id number]$  (1.88 m), weight 90.629 kg (199 lb 12.8 oz), SpO2 97 %. Physical Exam: Pleasant black male in no acute distress. Abdomen is soft with minimal tenderness in the right upper quadrant to deep palpation. A midline surgical scar is present. No rigidity is noted. Bowel sounds  are present.  Assessment/Plan: Impression: Cholecystitis, cholelithiasis, possible associated liver abscess Plan: Agree with continuing IV antibiotics. Given his previous surgery, he will need an open cholecystectomy with drainage of the liver abscess. The risks and benefits of the procedure including bleeding, infection, cardiopulmonary difficulties, and the possibility of a blood transfusion were fully explained to the patient, who gave informed consent. Would like to continue IV antibiotics until surgery. Temporarily scheduled for surgery on  09/05/2014.  Denis Koppel A 09/03/2014, 11:47 AM

## 2014-09-03 NOTE — Progress Notes (Signed)
Utilization review Completed Denice Cardon RN BSN   

## 2014-09-03 NOTE — Progress Notes (Addendum)
Pt temp was 102.1 at 2030, gave 650mg  Tylenol as per orders. 3 hours later temp is 102.2. Paged Dr Felecia Shelling made aware. Waiting for orders. Will continue to monitor.  Dr Felecia Shelling called back gave orders for another dose of Tylenol 650mg  and blood cultures.

## 2014-09-04 LAB — CBC
HEMATOCRIT: 32.1 % — AB (ref 39.0–52.0)
Hemoglobin: 10.4 g/dL — ABNORMAL LOW (ref 13.0–17.0)
MCH: 29.6 pg (ref 26.0–34.0)
MCHC: 32.4 g/dL (ref 30.0–36.0)
MCV: 91.5 fL (ref 78.0–100.0)
PLATELETS: 187 10*3/uL (ref 150–400)
RBC: 3.51 MIL/uL — ABNORMAL LOW (ref 4.22–5.81)
RDW: 14.8 % (ref 11.5–15.5)
WBC: 11 10*3/uL — ABNORMAL HIGH (ref 4.0–10.5)

## 2014-09-04 LAB — HEPATIC FUNCTION PANEL
ALT: 38 U/L (ref 0–53)
AST: 45 U/L — ABNORMAL HIGH (ref 0–37)
Albumin: 2.4 g/dL — ABNORMAL LOW (ref 3.5–5.2)
Alkaline Phosphatase: 88 U/L (ref 39–117)
BILIRUBIN TOTAL: 0.8 mg/dL (ref 0.3–1.2)
Bilirubin, Direct: 0.3 mg/dL (ref 0.0–0.5)
Indirect Bilirubin: 0.5 mg/dL (ref 0.3–0.9)
TOTAL PROTEIN: 6.4 g/dL (ref 6.0–8.3)

## 2014-09-04 LAB — GLUCOSE, CAPILLARY
GLUCOSE-CAPILLARY: 209 mg/dL — AB (ref 70–99)
Glucose-Capillary: 170 mg/dL — ABNORMAL HIGH (ref 70–99)
Glucose-Capillary: 201 mg/dL — ABNORMAL HIGH (ref 70–99)
Glucose-Capillary: 168 mg/dL — ABNORMAL HIGH (ref 70–99)

## 2014-09-04 LAB — BASIC METABOLIC PANEL
Anion gap: 6 (ref 5–15)
BUN: 25 mg/dL — ABNORMAL HIGH (ref 6–23)
CALCIUM: 8.1 mg/dL — AB (ref 8.4–10.5)
CO2: 23 mmol/L (ref 19–32)
Chloride: 104 mmol/L (ref 96–112)
Creatinine, Ser: 1.54 mg/dL — ABNORMAL HIGH (ref 0.50–1.35)
GFR, EST AFRICAN AMERICAN: 47 mL/min — AB (ref >90)
GFR, EST NON AFRICAN AMERICAN: 40 mL/min — AB (ref >90)
GLUCOSE: 185 mg/dL — AB (ref 70–99)
Potassium: 4.3 mmol/L (ref 3.5–5.1)
SODIUM: 133 mmol/L — AB (ref 135–145)

## 2014-09-04 MED ORDER — MAGNESIUM HYDROXIDE 400 MG/5ML PO SUSP: 30.0000 mL | Freq: Four times a day (QID) | ORAL | Status: DC | PRN

## 2014-09-04 MED ORDER — CHLORHEXIDINE GLUCONATE 4 % EX LIQD
1.0000 "application " | Freq: Once | CUTANEOUS | Status: AC
  Administered 2014-09-04: 1 via TOPICAL
  Filled 2014-09-04 (×2): qty 15

## 2014-09-04 MED ORDER — MAGNESIUM HYDROXIDE 400 MG/5ML PO SUSP
30.0000 mL | Freq: Three times a day (TID) | ORAL | Status: DC | PRN
  Administered 2014-09-04: 30 mL via ORAL
  Filled 2014-09-04: qty 30

## 2014-09-04 NOTE — Progress Notes (Signed)
ANTIBIOTIC CONSULT NOTE - FOLLOW UP  Pharmacy Consult for Zosyn Indication: Intra-abdominal Infection  No Known Allergies  Patient Measurements: Height: 6\' 2"  (188 cm) Weight: 199 lb 12.8 oz (90.629 kg) IBW/kg (Calculated) : 82.2  Vital Signs: Temp: 98.2 F (36.8 C) (04/10 0644) Temp Source: Oral (04/10 0644) BP: 120/60 mmHg (04/10 0644) Pulse Rate: 91 (04/10 0644) Intake/Output from previous day: 04/09 0701 - 04/10 0700 In: 2232.5 [P.O.:720; I.V.:1412.5; IV Piggyback:100] Out: 700 [Urine:700] Intake/Output from this shift:    Labs:  Recent Labs  09/02/14 1905 09/03/14 0626 09/04/14 0451  WBC 11.1* 10.5 11.0*  HGB 11.2* 10.2* 10.4*  PLT 201 202 187  CREATININE 1.72* 1.46* 1.54*   Estimated Creatinine Clearance: 43 mL/min (by C-G formula based on Cr of 1.54). No results for input(s): VANCOTROUGH, VANCOPEAK, VANCORANDOM, GENTTROUGH, GENTPEAK, GENTRANDOM, TOBRATROUGH, TOBRAPEAK, TOBRARND, AMIKACINPEAK, AMIKACINTROU, AMIKACIN in the last 72 hours.   Microbiology: Recent Results (from the past 720 hour(s))  Surgical pcr screen     Status: None   Collection Time: 09/03/14  5:55 PM  Result Value Ref Range Status   MRSA, PCR NEGATIVE NEGATIVE Final   Staphylococcus aureus NEGATIVE NEGATIVE Final    Comment:        The Xpert SA Assay (FDA approved for NASAL specimens in patients over 42 years of age), is one component of a comprehensive surveillance program.  Test performance has been validated by [facility] for patients greater than or equal to 26 year old. It is not intended to diagnose infection nor to guide or monitor treatment.   Culture, blood (routine x 2)     Status: None (Preliminary result)   Collection Time: 09/04/14  1:42 AM  Result Value Ref Range Status   Specimen Description RIGHT ANTECUBITAL  Final   Special Requests BOTTLES DRAWN AEROBIC AND ANAEROBIC 6CC  Final   Culture PENDING  Incomplete   Report Status PENDING  Incomplete  Culture,  blood (routine x 2)     Status: None (Preliminary result)   Collection Time: 09/04/14  1:50 AM  Result Value Ref Range Status   Specimen Description BLOOD RIGHT ARM  Final   Special Requests BOTTLES DRAWN AEROBIC AND ANAEROBIC 6CC  Final   Culture PENDING  Incomplete   Report Status PENDING  Incomplete   Anti-infectives    Start     Dose/Rate Route Frequency Ordered Stop   09/03/14 1000  piperacillin-tazobactam (ZOSYN) IVPB 3.375 g     3.375 g 12.5 mL/hr over 240 Minutes Intravenous Every 8 hours 09/03/14 0831     09/03/14 0600  piperacillin-tazobactam (ZOSYN) IVPB 3.375 g     3.375 g 12.5 mL/hr over 240 Minutes Intravenous  Once 09/02/14 2329 09/03/14 0944   09/02/14 2200  piperacillin-tazobactam (ZOSYN) IVPB 3.375 g     3.375 g 12.5 mL/hr over 240 Minutes Intravenous  Once 09/02/14 2149 09/02/14 2217     Assessment: Okay for Protocol, renal function and WBC relatively stable, micro pending.  Fever and plan for open cholecystectomy with drainage of the liver abscess noted.  Goal of Therapy:  Eradicate infection.   Plan:  Zosyn 3.375gm IV every 8 hours. Follow-up micro data, labs, vitals.  Lamonte Richer R 09/04/2014,10:34 AM

## 2014-09-04 NOTE — Progress Notes (Signed)
Subjective: Patient feels slightly better today. He was spiking fever yesterday. He was evaluated by Dr. Arnoldo Morale and scheduled for open cholecystectomy and drainage of liver abscess. Objective: Vital signs in last 24 hours: Temp:  [98.2 F (36.8 C)-102.2 F (39 C)] 98.2 F (36.8 C) (04/10 0644) Pulse Rate:  [86-91] 91 (04/10 0644) Resp:  [20] 20 (04/10 0644) BP: (120-140)/(60-73) 120/60 mmHg (04/10 0644) SpO2:  [98 %-99 %] 99 % (04/10 0644) Weight change:  Last BM Date: 08/31/14  Intake/Output from previous day: 04/09 0701 - 04/10 0700 In: 2232.5 [P.O.:720; I.V.:1412.5; IV Piggyback:100] Out: 700 [Urine:700]  PHYSICAL EXAM General appearance: alert and no distress Resp: clear to auscultation bilaterally Cardio: S1, S2 normal GI: generalized  abdominal tendernss, no guarding, bowel sound ++ Extremities: extremities normal, atraumatic, no cyanosis or edema  Lab Results:  Results for orders placed or performed during the hospital encounter of 09/02/14 (from the past 48 hour(s))  CBC with Differential/Platelet     Status: Abnormal   Collection Time: 09/02/14  7:05 PM  Result Value Ref Range   WBC 11.1 (H) 4.0 - 10.5 K/uL   RBC 3.80 (L) 4.22 - 5.81 MIL/uL   Hemoglobin 11.2 (L) 13.0 - 17.0 g/dL   HCT 34.7 (L) 39.0 - 52.0 %   MCV 91.3 78.0 - 100.0 fL   MCH 29.5 26.0 - 34.0 pg   MCHC 32.3 30.0 - 36.0 g/dL   RDW 14.1 11.5 - 15.5 %   Platelets 201 150 - 400 K/uL   Neutrophils Relative % 89 (H) 43 - 77 %   Neutro Abs 9.9 (H) 1.7 - 7.7 K/uL   Lymphocytes Relative 4 (L) 12 - 46 %   Lymphs Abs 0.4 (L) 0.7 - 4.0 K/uL   Monocytes Relative 7 3 - 12 %   Monocytes Absolute 0.8 0.1 - 1.0 K/uL   Eosinophils Relative 0 0 - 5 %   Eosinophils Absolute 0.0 0.0 - 0.7 K/uL   Basophils Relative 0 0 - 1 %   Basophils Absolute 0.0 0.0 - 0.1 K/uL  Comprehensive metabolic panel     Status: Abnormal   Collection Time: 09/02/14  7:05 PM  Result Value Ref Range   Sodium 132 (L) 135 - 145 mmol/L    Potassium 4.7 3.5 - 5.1 mmol/L   Chloride 99 96 - 112 mmol/L   CO2 23 19 - 32 mmol/L   Glucose, Bld 213 (H) 70 - 99 mg/dL   BUN 25 (H) 6 - 23 mg/dL   Creatinine, Ser 1.72 (H) 0.50 - 1.35 mg/dL   Calcium 8.9 8.4 - 10.5 mg/dL   Total Protein 7.5 6.0 - 8.3 g/dL   Albumin 2.9 (L) 3.5 - 5.2 g/dL   AST 51 (H) 0 - 37 U/L   ALT 31 0 - 53 U/L   Alkaline Phosphatase 91 39 - 117 U/L   Total Bilirubin 1.1 0.3 - 1.2 mg/dL   GFR calc non Af Amer 35 (L) >90 mL/min   GFR calc Af Amer 41 (L) >90 mL/min    Comment: (NOTE) The eGFR has been calculated using the CKD EPI equation. This calculation has not been validated in all clinical situations. eGFR's persistently <90 mL/min signify possible Chronic Kidney Disease.    Anion gap 10 5 - 15  Lipase, blood     Status: None   Collection Time: 09/02/14  7:05 PM  Result Value Ref Range   Lipase 18 11 - 59 U/L  Lactic acid, plasma  Status: None   Collection Time: 09/02/14  7:55 PM  Result Value Ref Range   Lactic Acid, Venous 1.9 0.5 - 2.0 mmol/L  Lactic acid, plasma     Status: None   Collection Time: 09/02/14 10:39 PM  Result Value Ref Range   Lactic Acid, Venous 1.8 0.5 - 2.0 mmol/L  CBC     Status: Abnormal   Collection Time: 09/03/14  6:26 AM  Result Value Ref Range   WBC 10.5 4.0 - 10.5 K/uL   RBC 3.45 (L) 4.22 - 5.81 MIL/uL   Hemoglobin 10.2 (L) 13.0 - 17.0 g/dL   HCT 31.4 (L) 39.0 - 52.0 %   MCV 91.0 78.0 - 100.0 fL   MCH 29.6 26.0 - 34.0 pg   MCHC 32.5 30.0 - 36.0 g/dL   RDW 14.3 11.5 - 15.5 %   Platelets 202 150 - 400 K/uL  Comprehensive metabolic panel     Status: Abnormal   Collection Time: 09/03/14  6:26 AM  Result Value Ref Range   Sodium 133 (L) 135 - 145 mmol/L   Potassium 4.1 3.5 - 5.1 mmol/L   Chloride 102 96 - 112 mmol/L   CO2 24 19 - 32 mmol/L   Glucose, Bld 147 (H) 70 - 99 mg/dL   BUN 22 6 - 23 mg/dL   Creatinine, Ser 1.46 (H) 0.50 - 1.35 mg/dL   Calcium 8.3 (L) 8.4 - 10.5 mg/dL   Total Protein 6.6 6.0 - 8.3  g/dL   Albumin 2.6 (L) 3.5 - 5.2 g/dL   AST 69 (H) 0 - 37 U/L   ALT 42 0 - 53 U/L   Alkaline Phosphatase 86 39 - 117 U/L   Total Bilirubin 1.2 0.3 - 1.2 mg/dL   GFR calc non Af Amer 43 (L) >90 mL/min   GFR calc Af Amer 50 (L) >90 mL/min    Comment: (NOTE) The eGFR has been calculated using the CKD EPI equation. This calculation has not been validated in all clinical situations. eGFR's persistently <90 mL/min signify possible Chronic Kidney Disease.    Anion gap 7 5 - 15  Glucose, capillary     Status: Abnormal   Collection Time: 09/03/14  7:39 AM  Result Value Ref Range   Glucose-Capillary 143 (H) 70 - 99 mg/dL   Comment 1 Notify RN   Urinalysis, Routine w reflex microscopic     Status: Abnormal   Collection Time: 09/03/14  9:30 AM  Result Value Ref Range   Color, Urine YELLOW YELLOW   APPearance HAZY (A) CLEAR   Specific Gravity, Urine 1.020 1.005 - 1.030   pH 5.0 5.0 - 8.0   Glucose, UA NEGATIVE NEGATIVE mg/dL   Hgb urine dipstick LARGE (A) NEGATIVE   Bilirubin Urine NEGATIVE NEGATIVE   Ketones, ur TRACE (A) NEGATIVE mg/dL   Protein, ur 100 (A) NEGATIVE mg/dL   Urobilinogen, UA 1.0 0.0 - 1.0 mg/dL   Nitrite NEGATIVE NEGATIVE   Leukocytes, UA NEGATIVE NEGATIVE  Urine microscopic-add on     Status: Abnormal   Collection Time: 09/03/14  9:30 AM  Result Value Ref Range   WBC, UA 0-2 <3 WBC/hpf   RBC / HPF 3-6 <3 RBC/hpf   Bacteria, UA FEW (A) RARE   Casts GRANULAR CAST (A) NEGATIVE  Glucose, capillary     Status: Abnormal   Collection Time: 09/03/14  1:33 PM  Result Value Ref Range   Glucose-Capillary 132 (H) 70 - 99 mg/dL   Comment 1 Notify  RN   Glucose, capillary     Status: Abnormal   Collection Time: 09/03/14  4:59 PM  Result Value Ref Range   Glucose-Capillary 248 (H) 70 - 99 mg/dL   Comment 1 Notify RN   Surgical pcr screen     Status: None   Collection Time: 09/03/14  5:55 PM  Result Value Ref Range   MRSA, PCR NEGATIVE NEGATIVE   Staphylococcus aureus  NEGATIVE NEGATIVE    Comment:        The Xpert SA Assay (FDA approved for NASAL specimens in patients over 52 years of age), is one component of a comprehensive surveillance program.  Test performance has been validated by [facility] for patients greater than or equal to 48 year old. It is not intended to diagnose infection nor to guide or monitor treatment.   Glucose, capillary     Status: Abnormal   Collection Time: 09/03/14  9:44 PM  Result Value Ref Range   Glucose-Capillary 193 (H) 70 - 99 mg/dL   Comment 1 Notify RN    Comment 2 Document in Chart   Culture, blood (routine x 2)     Status: None (Preliminary result)   Collection Time: 09/04/14  1:42 AM  Result Value Ref Range   Specimen Description RIGHT ANTECUBITAL    Special Requests BOTTLES DRAWN AEROBIC AND ANAEROBIC 6CC    Culture PENDING    Report Status PENDING   Culture, blood (routine x 2)     Status: None (Preliminary result)   Collection Time: 09/04/14  1:50 AM  Result Value Ref Range   Specimen Description BLOOD RIGHT ARM    Special Requests BOTTLES DRAWN AEROBIC AND ANAEROBIC 6CC    Culture PENDING    Report Status PENDING   CBC     Status: Abnormal   Collection Time: 09/04/14  4:51 AM  Result Value Ref Range   WBC 11.0 (H) 4.0 - 10.5 K/uL   RBC 3.51 (L) 4.22 - 5.81 MIL/uL   Hemoglobin 10.4 (L) 13.0 - 17.0 g/dL   HCT 32.1 (L) 39.0 - 52.0 %   MCV 91.5 78.0 - 100.0 fL   MCH 29.6 26.0 - 34.0 pg   MCHC 32.4 30.0 - 36.0 g/dL   RDW 14.8 11.5 - 15.5 %   Platelets 187 150 - 400 K/uL  Basic metabolic panel     Status: Abnormal   Collection Time: 09/04/14  4:51 AM  Result Value Ref Range   Sodium 133 (L) 135 - 145 mmol/L   Potassium 4.3 3.5 - 5.1 mmol/L   Chloride 104 96 - 112 mmol/L   CO2 23 19 - 32 mmol/L   Glucose, Bld 185 (H) 70 - 99 mg/dL   BUN 25 (H) 6 - 23 mg/dL   Creatinine, Ser 1.54 (H) 0.50 - 1.35 mg/dL   Calcium 8.1 (L) 8.4 - 10.5 mg/dL   GFR calc non Af Amer 40 (L) >90 mL/min   GFR calc Af  Amer 47 (L) >90 mL/min    Comment: (NOTE) The eGFR has been calculated using the CKD EPI equation. This calculation has not been validated in all clinical situations. eGFR's persistently <90 mL/min signify possible Chronic Kidney Disease.    Anion gap 6 5 - 15  Hepatic function panel     Status: Abnormal   Collection Time: 09/04/14  4:51 AM  Result Value Ref Range   Total Protein 6.4 6.0 - 8.3 g/dL   Albumin 2.4 (L) 3.5 - 5.2 g/dL  AST 45 (H) 0 - 37 U/L   ALT 38 0 - 53 U/L   Alkaline Phosphatase 88 39 - 117 U/L   Total Bilirubin 0.8 0.3 - 1.2 mg/dL   Bilirubin, Direct 0.3 0.0 - 0.5 mg/dL   Indirect Bilirubin 0.5 0.3 - 0.9 mg/dL  Glucose, capillary     Status: Abnormal   Collection Time: 09/04/14  7:24 AM  Result Value Ref Range   Glucose-Capillary 170 (H) 70 - 99 mg/dL   Comment 1 Notify RN     ABGS No results for input(s): PHART, PO2ART, TCO2, HCO3 in the last 72 hours.  Invalid input(s): PCO2 CULTURES Recent Results (from the past 240 hour(s))  Surgical pcr screen     Status: None   Collection Time: 09/03/14  5:55 PM  Result Value Ref Range Status   MRSA, PCR NEGATIVE NEGATIVE Final   Staphylococcus aureus NEGATIVE NEGATIVE Final    Comment:        The Xpert SA Assay (FDA approved for NASAL specimens in patients over 73 years of age), is one component of a comprehensive surveillance program.  Test performance has been validated by [facility] for patients greater than or equal to 19 year old. It is not intended to diagnose infection nor to guide or monitor treatment.   Culture, blood (routine x 2)     Status: None (Preliminary result)   Collection Time: 09/04/14  1:42 AM  Result Value Ref Range Status   Specimen Description RIGHT ANTECUBITAL  Final   Special Requests BOTTLES DRAWN AEROBIC AND ANAEROBIC 6CC  Final   Culture PENDING  Incomplete   Report Status PENDING  Incomplete  Culture, blood (routine x 2)     Status: None (Preliminary result)    Collection Time: 09/04/14  1:50 AM  Result Value Ref Range Status   Specimen Description BLOOD RIGHT ARM  Final   Special Requests BOTTLES DRAWN AEROBIC AND ANAEROBIC 6CC  Final   Culture PENDING  Incomplete   Report Status PENDING  Incomplete   Studies/Results: Dg Chest 2 View  09/03/2014   CLINICAL DATA:  Abdominal pain, liver abscess  EXAM: CHEST  2 VIEW  COMPARISON:  09/02/2014 images of the lung bases  FINDINGS: Cardiomediastinal silhouette is stable. No acute infiltrate or pleural effusion. No pulmonary edema. Mild degenerative changes thoracic spine.  IMPRESSION: No active cardiopulmonary disease.   Electronically Signed   By: Lahoma Crocker M.D.   On: 09/03/2014 12:56   Ct Abdomen Pelvis W Contrast  09/02/2014   CLINICAL DATA:  Sharp pain just above the umbilicus for 2 weeks. Constipation with no bowel movement for 1 week. Periumbilical abdominal pain.  EXAM: CT ABDOMEN AND PELVIS WITH CONTRAST  TECHNIQUE: Multidetector CT imaging of the abdomen and pelvis was performed using the standard protocol following bolus administration of intravenous contrast.  CONTRAST:  18m OMNIPAQUE IOHEXOL 300 MG/ML SOLN, 1082mOMNIPAQUE IOHEXOL 300 MG/ML SOLN  COMPARISON:  08/05/2008  FINDINGS: Mild dependent atelectasis or fibrosis in the lung bases. Mild bronchiectasis. Coronary artery calcifications.  Cholelithiasis with distended gallbladder and mild gallbladder wall thickening. This may indicate acute cholecystitis. Since the previous study, there is interval development of a heterogeneous hypo attenuating collection in the medial segment left lobe of liver measuring about 3 x 3.3 cm diameter. This is adjacent to the gallbladder. Differential diagnosis includes hepatic abscess, infiltrating gallbladder neoplasm, or metastasis. Suggest MRI for further characterization. No other focal liver lesions demonstrated. No bile duct dilatation. The pancreas, spleen,  adrenal glands, inferior vena cava, and retroperitoneal  lymph nodes are unremarkable. Calcific and noncalcific atherosclerotic changes in the abdominal aorta without aneurysm. Multiple cysts in the kidneys. No hydronephrosis. Stomach, small bowel, and colon are not abnormally distended. No free air or free fluid in the abdomen.  Pelvis: Bladder wall is not thickened. Diffuse enlargement of the prostate gland at 5.4 x 7.2 cm. No free or loculated pelvic fluid collections. No pelvic lymphadenopathy. Appendix is surgically absent. No evidence of diverticulitis. Degenerative changes in the spine. No destructive bone lesions.  IMPRESSION: Gallbladder distention with cholelithiasis and gallbladder wall thickening may indicate acute cholecystitis. Heterogeneous hypo attenuating septated lesion demonstrated in the medial segment left lobe of the liver adjacent to the gallbladder. Appearance is most likely to represent hepatic abscess although differential diagnosis would include infiltrating gallbladder neoplasm or metastasis. Suggest MRI for further evaluation. Diffuse enlargement of prostate gland. No bowel obstruction.   Electronically Signed   By: Lucienne Capers M.D.   On: 09/02/2014 21:31    Medications: I have reviewed the patient's current medications.  Assesment:   Active Problems:   Hepatic abscess   Abdominal pain   DM (diabetes mellitus) cholecystitis cholelithiasis   Plan:  Medications reviewed Continue IV Zosyn Surgical consult appreciated. As per surgery plan.    LOS: 2 days   Tuere Nwosu 09/04/2014, 10:35 AM

## 2014-09-04 NOTE — Progress Notes (Signed)
We'll proceed with open cholecystectomy tomorrow. The risks and benefits of the procedure including bleeding, infection, and hepatobiliary injury were fully explained to the patient, who gave informed consent.

## 2014-09-05 ENCOUNTER — Encounter (HOSPITAL_COMMUNITY): Payer: Self-pay | Admitting: Anesthesiology

## 2014-09-05 ENCOUNTER — Encounter (HOSPITAL_COMMUNITY)
Admission: EM | Disposition: A | Discharge status: Home Health | Payer: Self-pay | Source: Home / Self Care | Attending: Pulmonary Disease

## 2014-09-05 ENCOUNTER — Encounter (HOSPITAL_COMMUNITY): Payer: Self-pay | Admitting: *Deleted

## 2014-09-05 DIAGNOSIS — I739 Peripheral vascular disease, unspecified: Secondary | ICD-10-CM

## 2014-09-05 DIAGNOSIS — I10 Essential (primary) hypertension: Secondary | ICD-10-CM

## 2014-09-05 DIAGNOSIS — I4891 Unspecified atrial fibrillation: Secondary | ICD-10-CM

## 2014-09-05 DIAGNOSIS — I248 Other forms of acute ischemic heart disease: Secondary | ICD-10-CM

## 2014-09-05 LAB — URINE CULTURE
Colony Count: NO GROWTH
Culture: NO GROWTH

## 2014-09-05 LAB — HEMOGLOBIN A1C
Hgb A1c MFr Bld: 7.7 % — ABNORMAL HIGH (ref 4.8–5.6)
MEAN PLASMA GLUCOSE: 174 mg/dL

## 2014-09-05 LAB — BASIC METABOLIC PANEL
Anion gap: 7 (ref 5–15)
BUN: 23 mg/dL (ref 6–23)
CO2: 23 mmol/L (ref 19–32)
CREATININE: 1.59 mg/dL — AB (ref 0.50–1.35)
Calcium: 8.2 mg/dL — ABNORMAL LOW (ref 8.4–10.5)
Chloride: 105 mmol/L (ref 96–112)
GFR calc non Af Amer: 39 mL/min — ABNORMAL LOW (ref >90)
GFR, EST AFRICAN AMERICAN: 45 mL/min — AB (ref >90)
GLUCOSE: 166 mg/dL — AB (ref 70–99)
Potassium: 4.4 mmol/L (ref 3.5–5.1)
SODIUM: 135 mmol/L (ref 135–145)

## 2014-09-05 LAB — GLUCOSE, CAPILLARY
GLUCOSE-CAPILLARY: 140 mg/dL — AB (ref 70–99)
GLUCOSE-CAPILLARY: 229 mg/dL — AB (ref 70–99)
Glucose-Capillary: 169 mg/dL — ABNORMAL HIGH (ref 70–99)
Glucose-Capillary: 234 mg/dL — ABNORMAL HIGH (ref 70–99)

## 2014-09-05 LAB — MAGNESIUM: Magnesium: 2.1 mg/dL (ref 1.5–2.5)

## 2014-09-05 LAB — TROPONIN I
TROPONIN I: 0.59 ng/mL — AB (ref <0.031)
Troponin I: 0.51 ng/mL (ref <0.031)
Troponin I: 0.57 ng/mL (ref <0.031)

## 2014-09-05 LAB — TSH: TSH: 1.682 u[IU]/mL (ref 0.350–4.500)

## 2014-09-05 SURGERY — CHOLECYSTECTOMY
Anesthesia: General

## 2014-09-05 MED ORDER — DEXTROSE 5 % IV SOLN
5.0000 mg/h | INTRAVENOUS | Status: DC
  Administered 2014-09-05: 10 mg/h via INTRAVENOUS
  Administered 2014-09-05: 5 mg/h via INTRAVENOUS
  Administered 2014-09-05: 10 mg/h via INTRAVENOUS
  Filled 2014-09-05: qty 100

## 2014-09-05 SURGICAL SUPPLY — 48 items
APPLIER CLIP 11 MED OPEN (CLIP)
APPLIER CLIP 13 LRG OPEN (CLIP)
BAG HAMPER (MISCELLANEOUS) ×3 IMPLANT
CELLS DAT CNTRL 66122 CELL SVR (MISCELLANEOUS) IMPLANT
CHOLANGIOGRAM CATH TAUT (CATHETERS) IMPLANT
CLEANER TIP ELECTROSURG 2X2 (MISCELLANEOUS) ×3 IMPLANT
CLIP APPLIE 11 MED OPEN (CLIP) IMPLANT
CLIP APPLIE 13 LRG OPEN (CLIP) IMPLANT
CLOTH BEACON ORANGE TIMEOUT ST (SAFETY) ×3 IMPLANT
COVER LIGHT HANDLE STERIS (MISCELLANEOUS) ×6 IMPLANT
COVER MAYO STAND XLG (DRAPE) IMPLANT
DRAPE C-ARM FOLDED MOBILE STRL (DRAPES) IMPLANT
DRAPE WARM FLUID 44X44 (DRAPE) ×3 IMPLANT
DRESSING GELFORM 100 (GAUZE/BANDAGES/DRESSINGS) IMPLANT
DURAPREP 26ML APPLICATOR (WOUND CARE) ×3 IMPLANT
ELECT BLADE 6 FLAT ULTRCLN (ELECTRODE) ×3 IMPLANT
ELECT REM PT RETURN 9FT ADLT (ELECTROSURGICAL) ×3
ELECTRODE REM PT RTRN 9FT ADLT (ELECTROSURGICAL) ×1 IMPLANT
EVACUATOR DRAINAGE 10X20 100CC (DRAIN) IMPLANT
EVACUATOR SILICONE 100CC (DRAIN)
FORMALIN 10 PREFIL 480ML (MISCELLANEOUS) ×3 IMPLANT
GAUZE SPONGE 4X4 12PLY STRL (GAUZE/BANDAGES/DRESSINGS) ×3 IMPLANT
GLOVE SURG SS PI 7.5 STRL IVOR (GLOVE) ×6 IMPLANT
GOWN STRL REUS W/TWL LRG LVL3 (GOWN DISPOSABLE) ×9 IMPLANT
HEMOSTAT SNOW SURGICEL 2X4 (HEMOSTASIS) ×3 IMPLANT
INST SET MAJOR GENERAL (KITS) ×3 IMPLANT
KIT ROOM TURNOVER APOR (KITS) ×3 IMPLANT
MANIFOLD NEPTUNE II (INSTRUMENTS) ×3 IMPLANT
NEEDLE HYPO 25X1 1.5 SAFETY (NEEDLE) ×3 IMPLANT
NS IRRIG 1000ML POUR BTL (IV SOLUTION) ×6 IMPLANT
PACK ABDOMINAL MAJOR (CUSTOM PROCEDURE TRAY) ×3 IMPLANT
PAD ARMBOARD 7.5X6 YLW CONV (MISCELLANEOUS) ×3 IMPLANT
RTRCTR WOUND ALEXIS 18CM MED (MISCELLANEOUS)
SET BASIN LINEN APH (SET/KITS/TRAYS/PACK) ×3 IMPLANT
SPONGE INTESTINAL PEANUT (DISPOSABLE) ×3 IMPLANT
SPONGE LAP 4X18 X RAY DECT (DISPOSABLE) ×3 IMPLANT
STAPLER VISISTAT (STAPLE) ×3 IMPLANT
SUT ETHILON 3 0 FSL (SUTURE) IMPLANT
SUT SILK 2 0 (SUTURE)
SUT SILK 2-0 18XBRD TIE 12 (SUTURE) IMPLANT
SUT VIC AB 0 CT1 27 (SUTURE) ×4
SUT VIC AB 0 CT1 27XBRD ANTBC (SUTURE) ×1 IMPLANT
SUT VIC AB 0 CT1 27XCR 8 STRN (SUTURE) ×1 IMPLANT
SYR 20CC LL (SYRINGE) IMPLANT
SYR 30ML LL (SYRINGE) IMPLANT
SYRINGE 10CC LL (SYRINGE) ×3 IMPLANT
TOWEL BLUE STERILE X RAY DET (MISCELLANEOUS) ×3 IMPLANT
TRAY FOLEY CATH 16FR SILVER (SET/KITS/TRAYS/PACK) ×3 IMPLANT

## 2014-09-05 NOTE — Progress Notes (Signed)
He developed af with rvr. He will transfer to stepdown, start diltiazem. Cardiology consult, labs and echo

## 2014-09-05 NOTE — OR Nursing (Signed)
Fast heart rate  ,  12 lead obtained    A fib  With fast ventricular   Response.    Dr.  Jayme Cloud in  To see patient and talk wih patient.   Dr. Lovell Sheehan in to see patient .  Surgery postponed for further work up.

## 2014-09-05 NOTE — Progress Notes (Signed)
Troponin 0.57 called by lab MD/Midlevel notified via text page.  Previous result of 0.51

## 2014-09-05 NOTE — Progress Notes (Signed)
CRITICAL VALUE ALERT  Critical value received:  Troponin 0.59  Date of notification:  09/05/14  Time of notification:  1233  Critical value read back:yes  Nurse who received alert:  Lovena Neighbours RN  MD notified (1st page):  Dr  Rulon Sera  Time of first page:  1233  Responding MD:  Dr Juanetta Gosling  Time MD responded:  [phone]

## 2014-09-05 NOTE — Care Management Utilization Note (Signed)
UR completed 

## 2014-09-05 NOTE — Progress Notes (Signed)
Subjective: He says he feels okay. He has no new complaints. He still has some abdominal discomfort. He is scheduled for surgery today.  Objective: Vital signs in last 24 hours: Temp:  [98.5 F (36.9 C)-99.8 F (37.7 C)] 99 F (37.2 C) (04/11 0639) Pulse Rate:  [57-99] 65 (04/11 0639) Resp:  [20] 20 (04/11 0639) BP: (106-137)/(59-86) 128/86 mmHg (04/11 0639) SpO2:  [93 %-97 %] 97 % (04/11 0639) Weight change:  Last BM Date: 09/04/14  Intake/Output from previous day: 04/10 0701 - 04/11 0700 In: 2320 [P.O.:720; I.V.:1500; IV Piggyback:100] Out: 1100 [Urine:1100]  PHYSICAL EXAM General appearance: alert, cooperative and mild distress Resp: clear to auscultation bilaterally Cardio: regular rate and rhythm, S1, S2 normal, no murmur, click, rub or gallop GI: soft, non-tender; bowel sounds normal; no masses,  no organomegaly Extremities: extremities normal, atraumatic, no cyanosis or edema  Lab Results:  Results for orders placed or performed during the hospital encounter of 09/02/14 (from the past 48 hour(s))  Urinalysis, Routine w reflex microscopic     Status: Abnormal   Collection Time: 09/03/14  9:30 AM  Result Value Ref Range   Color, Urine YELLOW YELLOW   APPearance HAZY (A) CLEAR   Specific Gravity, Urine 1.020 1.005 - 1.030   pH 5.0 5.0 - 8.0   Glucose, UA NEGATIVE NEGATIVE mg/dL   Hgb urine dipstick LARGE (A) NEGATIVE   Bilirubin Urine NEGATIVE NEGATIVE   Ketones, ur TRACE (A) NEGATIVE mg/dL   Protein, ur 100 (A) NEGATIVE mg/dL   Urobilinogen, UA 1.0 0.0 - 1.0 mg/dL   Nitrite NEGATIVE NEGATIVE   Leukocytes, UA NEGATIVE NEGATIVE  Urine microscopic-add on     Status: Abnormal   Collection Time: 09/03/14  9:30 AM  Result Value Ref Range   WBC, UA 0-2 <3 WBC/hpf   RBC / HPF 3-6 <3 RBC/hpf   Bacteria, UA FEW (A) RARE   Casts GRANULAR CAST (A) NEGATIVE  Glucose, capillary     Status: Abnormal   Collection Time: 09/03/14  1:33 PM  Result Value Ref Range    Glucose-Capillary 132 (H) 70 - 99 mg/dL   Comment 1 Notify RN   Glucose, capillary     Status: Abnormal   Collection Time: 09/03/14  4:59 PM  Result Value Ref Range   Glucose-Capillary 248 (H) 70 - 99 mg/dL   Comment 1 Notify RN   Surgical pcr screen     Status: None   Collection Time: 09/03/14  5:55 PM  Result Value Ref Range   MRSA, PCR NEGATIVE NEGATIVE   Staphylococcus aureus NEGATIVE NEGATIVE    Comment:        The Xpert SA Assay (FDA approved for NASAL specimens in patients over 16 years of age), is one component of a comprehensive surveillance program.  Test performance has been validated by [facility] for patients greater than or equal to 75 year old. It is not intended to diagnose infection nor to guide or monitor treatment.   Glucose, capillary     Status: Abnormal   Collection Time: 09/03/14  9:44 PM  Result Value Ref Range   Glucose-Capillary 193 (H) 70 - 99 mg/dL   Comment 1 Notify RN    Comment 2 Document in Chart   Culture, blood (routine x 2)     Status: None (Preliminary result)   Collection Time: 09/04/14  1:42 AM  Result Value Ref Range   Specimen Description RIGHT ANTECUBITAL    Special Requests BOTTLES DRAWN AEROBIC AND ANAEROBIC  [facility]    Culture PENDING    Report Status PENDING   Culture, blood (routine x 2)     Status: None (Preliminary result)   Collection Time: 09/04/14  1:50 AM  Result Value Ref Range   Specimen Description BLOOD RIGHT ARM    Special Requests BOTTLES DRAWN AEROBIC AND ANAEROBIC 6CC    Culture PENDING    Report Status PENDING   CBC     Status: Abnormal   Collection Time: 09/04/14  4:51 AM  Result Value Ref Range   WBC 11.0 (H) 4.0 - 10.5 K/uL   RBC 3.51 (L) 4.22 - 5.81 MIL/uL   Hemoglobin 10.4 (L) 13.0 - 17.0 g/dL   HCT 32.1 (L) 39.0 - 52.0 %   MCV 91.5 78.0 - 100.0 fL   MCH 29.6 26.0 - 34.0 pg   MCHC 32.4 30.0 - 36.0 g/dL   RDW 14.8 11.5 - 15.5 %   Platelets 187 150 - 400 K/uL  Basic metabolic panel     Status:  Abnormal   Collection Time: 09/04/14  4:51 AM  Result Value Ref Range   Sodium 133 (L) 135 - 145 mmol/L   Potassium 4.3 3.5 - 5.1 mmol/L   Chloride 104 96 - 112 mmol/L   CO2 23 19 - 32 mmol/L   Glucose, Bld 185 (H) 70 - 99 mg/dL   BUN 25 (H) 6 - 23 mg/dL   Creatinine, Ser 1.54 (H) 0.50 - 1.35 mg/dL   Calcium 8.1 (L) 8.4 - 10.5 mg/dL   GFR calc non Af Amer 40 (L) >90 mL/min   GFR calc Af Amer 47 (L) >90 mL/min    Comment: (NOTE) The eGFR has been calculated using the CKD EPI equation. This calculation has not been validated in all clinical situations. eGFR's persistently <90 mL/min signify possible Chronic Kidney Disease.    Anion gap 6 5 - 15  Hepatic function panel     Status: Abnormal   Collection Time: 09/04/14  4:51 AM  Result Value Ref Range   Total Protein 6.4 6.0 - 8.3 g/dL   Albumin 2.4 (L) 3.5 - 5.2 g/dL   AST 45 (H) 0 - 37 U/L   ALT 38 0 - 53 U/L   Alkaline Phosphatase 88 39 - 117 U/L   Total Bilirubin 0.8 0.3 - 1.2 mg/dL   Bilirubin, Direct 0.3 0.0 - 0.5 mg/dL   Indirect Bilirubin 0.5 0.3 - 0.9 mg/dL  Glucose, capillary     Status: Abnormal   Collection Time: 09/04/14  7:24 AM  Result Value Ref Range   Glucose-Capillary 170 (H) 70 - 99 mg/dL   Comment 1 Notify RN   Glucose, capillary     Status: Abnormal   Collection Time: 09/04/14 11:55 AM  Result Value Ref Range   Glucose-Capillary 209 (H) 70 - 99 mg/dL   Comment 1 Notify RN   Glucose, capillary     Status: Abnormal   Collection Time: 09/04/14  4:37 PM  Result Value Ref Range   Glucose-Capillary 201 (H) 70 - 99 mg/dL   Comment 1 Notify RN   Glucose, capillary     Status: Abnormal   Collection Time: 09/04/14 10:21 PM  Result Value Ref Range   Glucose-Capillary 168 (H) 70 - 99 mg/dL   Comment 1 Notify RN    Comment 2 Document in Chart   Glucose, capillary     Status: Abnormal   Collection Time: 09/05/14  7:27 AM  Result Value Ref Range  Glucose-Capillary 169 (H) 70 - 99 mg/dL   Comment 1 Notify RN     Comment 2 Document in Chart     ABGS No results for input(s): PHART, PO2ART, TCO2, HCO3 in the last 72 hours.  Invalid input(s): PCO2 CULTURES Recent Results (from the past 240 hour(s))  Surgical pcr screen     Status: None   Collection Time: 09/03/14  5:55 PM  Result Value Ref Range Status   MRSA, PCR NEGATIVE NEGATIVE Final   Staphylococcus aureus NEGATIVE NEGATIVE Final    Comment:        The Xpert SA Assay (FDA approved for NASAL specimens in patients over 29 years of age), is one component of a comprehensive surveillance program.  Test performance has been validated by [facility] for patients greater than or equal to 4 year old. It is not intended to diagnose infection nor to guide or monitor treatment.   Culture, blood (routine x 2)     Status: None (Preliminary result)   Collection Time: 09/04/14  1:42 AM  Result Value Ref Range Status   Specimen Description RIGHT ANTECUBITAL  Final   Special Requests BOTTLES DRAWN AEROBIC AND ANAEROBIC 6CC  Final   Culture PENDING  Incomplete   Report Status PENDING  Incomplete  Culture, blood (routine x 2)     Status: None (Preliminary result)   Collection Time: 09/04/14  1:50 AM  Result Value Ref Range Status   Specimen Description BLOOD RIGHT ARM  Final   Special Requests BOTTLES DRAWN AEROBIC AND ANAEROBIC 6CC  Final   Culture PENDING  Incomplete   Report Status PENDING  Incomplete   Studies/Results: Dg Chest 2 View  09/03/2014   CLINICAL DATA:  Abdominal pain, liver abscess  EXAM: CHEST  2 VIEW  COMPARISON:  09/02/2014 images of the lung bases  FINDINGS: Cardiomediastinal silhouette is stable. No acute infiltrate or pleural effusion. No pulmonary edema. Mild degenerative changes thoracic spine.  IMPRESSION: No active cardiopulmonary disease.   Electronically Signed   By: Lahoma Crocker M.D.   On: 09/03/2014 12:56    Medications:  Prior to Admission:  Prescriptions prior to admission  Medication Sig Dispense Refill Last  Dose  . aspirin 325 MG tablet Take 325 mg by mouth daily.     09/02/2014 at Unknown time  . metFORMIN (GLUCOPHAGE) 500 MG tablet Take 500 mg by mouth 2 (two) times daily with a meal.     09/02/2014 at Unknown time  . metoprolol tartrate (LOPRESSOR) 25 MG tablet Take 25 mg by mouth 2 (two) times daily.     09/02/2014 at 800A  . pravastatin (PRAVACHOL) 40 MG tablet Take 40 mg by mouth daily.     08/30/2014 at Unknown time   Scheduled: . enoxaparin (LOVENOX) injection  40 mg Subcutaneous Q24H  . insulin aspart  0-9 Units Subcutaneous TID WC  . metoprolol tartrate  25 mg Oral BID  . piperacillin-tazobactam (ZOSYN)  IV  3.375 g Intravenous Q8H  . pravastatin  40 mg Oral Daily   Continuous: . sodium chloride 125 mL/hr at 09/04/14 1921   [id number] **OR** acetaminophen, HYDROmorphone (DILAUDID) injection, magnesium hydroxide, ondansetron **OR** ondansetron (ZOFRAN) IV  Assesment: He has cholecystitis. He is set for surgery today. Active Problems:   Hepatic abscess   Abdominal pain   DM (diabetes mellitus)    Plan: For open cholecystectomy today    LOS: 3 days   Niala Stcharles L 09/05/2014, 8:10 AM

## 2014-09-05 NOTE — Progress Notes (Signed)
  Echocardiogram 2D Echocardiogram has been performed.  Warren Mcdonald 09/05/2014, 1:24 PM

## 2014-09-05 NOTE — Progress Notes (Signed)
Surgery canceled 2 new onset atrial fibrillation seen in preoperative area. Dr. Shaune Pollack made aware. Patient be transferred to stepdown.

## 2014-09-05 NOTE — Consult Note (Signed)
Consulting cardiologist: Dr. Jonelle Sidle Requesting physician: Dr. Kari Baars  Reason for consultation: Atrial fibrillation  Clinical Summary Warren Mcdonald is an 81 y.o.male admitted to the hospital with complaints of abdominal pain, nausea, and anorexia over the last few weeks. CT imaging shows evidence of cholelithiasis and cholecystitis with associated hepatic abscess. He has been evaluated by Dr. Lovell Sheehan, and plan was for him to undergo open cholecystectomy today, although surgery was canceled when he was found to be in rapid atrial fibrillation in the PACU. He is now in the ICU on intravenous diltiazem with controlled heart rate in the 80s, atrial fibrillation persists. He is not aware of any sense of palpitations or chest pain, and reports no prior history of cardiac arrhythmias.  He has also been found to have a mildly elevated troponin I of 0.59 in the absence of chest pain. He does not have any history of obstructive CAD, although does have documented atherosclerotic disease, specifically PAD as detailed below.   No Known Allergies  Medications Scheduled Medications: . enoxaparin (LOVENOX) injection  40 mg Subcutaneous Q24H  . insulin aspart  0-9 Units Subcutaneous TID WC  . metoprolol tartrate  25 mg Oral BID  . piperacillin-tazobactam (ZOSYN)  IV  3.375 g Intravenous Q8H  . pravastatin  40 mg Oral Daily    Infusions: . sodium chloride 125 mL/hr at 09/05/14 1300  . diltiazem (CARDIZEM) infusion 15 mg/hr (09/05/14 1300)    PRN Medications: acetaminophen **OR** acetaminophen, HYDROmorphone (DILAUDID) injection, magnesium hydroxide, ondansetron **OR** ondansetron (ZOFRAN) IV   Past Medical History  Diagnosis Date  . Type 2 diabetes mellitus   . Essential hypertension   . Hyperlipidemia   . PUD (peptic ulcer disease)   . Peripheral vascular disease 2011    Left above-knee popliteal to posterior tibial artery bypass     Past Surgical History  Procedure  Laterality Date  . Appendectomy    . Cataract extraction w/ intraocular lens  implant, bilateral    . Pr vein bypass graft,aorto-fem-pop      Left above knee popliteal to posterior tibial artery bypass graft  . Eye surgery      Family History  Problem Relation Age of Onset  . Heart disease Mother   . Hypertension Mother   . Heart attack Mother   . Heart attack Father   . Heart disease Father     Heart disease before age 32  . Diabetes Other   . Prostate cancer Other   . Cancer Sister   . Deep vein thrombosis Sister   . Diabetes Sister     Amputation-right leg  . Cancer Daughter   . Diabetes Daughter   . Cancer Brother     Social History Warren Mcdonald reports that he quit smoking about 26 years ago. His smoking use included Cigars. He quit smokeless tobacco use about 26 years ago. His smokeless tobacco use included Chew. Warren Mcdonald reports that he does not drink alcohol.  Review of Systems Complete review of systems negative except as otherwise outlined in the clinical summary and also the following. No exertional chest pain. Has been "weak" for the last few weeks. No orthopnea or PND.  Physical Examination Blood pressure 105/64, pulse 110, temperature 98.3 F (36.8 C), temperature source Oral, resp. rate 24, height  (1.88 m), weight 199 lb (90.266 kg), SpO2 96 %.  Intake/Output Summary (Last 24 hours) at 09/05/14 1557 Last data filed at 09/05/14 1300  Gross per 24 hour  Intake 2253.08 ml  Output    400 ml  Net 1853.08 ml   Telemetry: Rate-controlled atrial fibrillation.  HEENT: Conjunctiva and lids normal, oropharynx clear. Neck: Supple, no elevated JVP or carotid bruits, no thyromegaly. Lungs: Clear to auscultation, nonlabored breathing at rest. Cardiac: Irregularly irregular, no S3, soft systolic murmur, no pericardial rub. Abdomen: Soft, nontender, bowel sounds present, no guarding or rebound. Extremities: No pitting edema, distal pulses 1-2+. Skin: Warm  and dry. Musculoskeletal: No kyphosis. Neuropsychiatric: Alert and oriented x3, affect grossly appropriate.   Lab Results  Basic Metabolic Panel:  Recent Labs Lab 09/02/14 1905 09/03/14 0626 09/04/14 0451 09/05/14 1130  NA 132* 133* 133* 135  K 4.7 4.1 4.3 4.4  CL 99 102 104 105  CO2 GLUCOSE 213* 147* 185* 166*  BUN 25* 22 25* 23  CREATININE 1.72* 1.46* 1.54* 1.59*  CALCIUM 8.9 8.3* 8.1* 8.2*  MG  --   --   --  2.1    Liver Function Tests:  Recent Labs Lab 09/02/14 1905 09/03/14 0626 09/04/14 0451  AST 51* 69* 45*  ALT 31 42 38  ALKPHOS 91 86 88  BILITOT 1.1 1.2 0.8  PROT 7.5 6.6 6.4  ALBUMIN 2.9* 2.6* 2.4*    CBC:  Recent Labs Lab 09/02/14 1905 09/03/14 0626 09/04/14 0451  WBC 11.1* 10.5 11.0*  NEUTROABS 9.9*  --   --   HGB 11.2* 10.2* 10.4*  HCT 34.7* 31.4* 32.1*  MCV 91.3 91.0 91.5  PLT 201 202 187    Cardiac Enzymes:  Recent Labs Lab 09/05/14 1130  TROPONINI 0.59*   ECG Rapid atrial fibrillation with LVH, leftward axis, and diffuse nonspecific ST-T changes.  Imaging Echocardiogram 09/05/2014: Study Conclusions  - Left ventricle: The cavity size was normal. Wall thickness was increased in a pattern of mild LVH. Systolic function was normal. The estimated ejection fraction was in the range of 55% to 60%. Wall motion was normal; there were no regional wall motion abnormalities. Findings consistent with left ventricular diastolic dysfunction, ungraded in the setting of atrial fibrillation. - Aortic valve: Trileaflet; mildly thickened, mildly calcified leaflets. There was trivial regurgitation. - Mitral valve: Calcified annulus. Loose portion of mitral chordal structure (possible from anteromedial papillary) noted. There was mild regurgitation. - Left atrium: The atrium was mildly dilated. - Right atrium: The atrium was mildly dilated. - Tricuspid valve: There was moderate regurgitation. - Pulmonary  arteries: PA peak pressure: 41 mm Hg (S). - Pericardium, extracardiac: There was no pericardial effusion.  Impressions:  - Mild LVH with LVEF 55-60%, features consistent with diastolic dysfunction. Mild biatrial enlargement. 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 Sclerotic aortic valve with trivial regurgitation. Moderate tricuspid regurgitation with mildly increased PASP 41 mmHg.  Impression  1. Newly documented atrial fibrillation with rapid ventricular response in the setting of cholecystitis and hepatic abscess. Patient is not specifically aware of any palpitations or chest pain at this time. He has been placed on intravenous diltiazem with observation in the ICU, heart rate is much better controlled. CHADSVASC score is 5 at this point.  2. Abnormal troponin I, suspect demand ischemia, although need further trend for assessment.  3. Cholecystitis with hepatic abscess, pending open cholecystectomy.  4. Essential hypertension, blood pressure stable.  5. PAD status post previous left above-knee popliteal to posterior tibial artery bypass 2011.   Recommendations  Agree with intravenous diltiazem, heart rate control is much better. He had an echocardiogram  done today showing LVEF 55-60%. Would trend troponin I levels to ensure no substantial progression, although at this point suspect demand ischemia rather than ACS. Follow-up ECG in the morning. Would not fully anticoagulate at this time pending surgery, issue can be discussed postoperatively. We will follow with you, at this point doubt that we need to pursue further ischemic workup prior to surgery, although we will reassess him again tomorrow.   Jonelle Sidle, M.D., F.A.C.C.

## 2014-09-06 DIAGNOSIS — K810 Acute cholecystitis: Secondary | ICD-10-CM | POA: Diagnosis present

## 2014-09-06 DIAGNOSIS — I481 Persistent atrial fibrillation: Secondary | ICD-10-CM

## 2014-09-06 DIAGNOSIS — I4891 Unspecified atrial fibrillation: Secondary | ICD-10-CM | POA: Diagnosis not present

## 2014-09-06 LAB — GLUCOSE, CAPILLARY
GLUCOSE-CAPILLARY: 168 mg/dL — AB (ref 70–99)
GLUCOSE-CAPILLARY: 200 mg/dL — AB (ref 70–99)
Glucose-Capillary: 217 mg/dL — ABNORMAL HIGH (ref 70–99)
Glucose-Capillary: 171 mg/dL — ABNORMAL HIGH (ref 70–99)

## 2014-09-06 MED ORDER — METOPROLOL TARTRATE 50 MG PO TABS
50.0000 mg | ORAL_TABLET | Freq: Two times a day (BID) | ORAL | Status: DC
  Administered 2014-09-06 (×2): 50 mg via ORAL
  Filled 2014-09-06 (×2): qty 1

## 2014-09-06 MED ORDER — GUAIFENESIN-DM 100-10 MG/5ML PO SYRP
5.0000 mL | ORAL_SOLUTION | ORAL | Status: DC | PRN
  Administered 2014-09-06: 5 mL via ORAL
  Filled 2014-09-06: qty 5

## 2014-09-06 MED ORDER — METOPROLOL TARTRATE 1 MG/ML IV SOLN
5.0000 mg | INTRAVENOUS | Status: DC | PRN
  Administered 2014-09-06 – 2014-09-09 (×16): 5 mg via INTRAVENOUS
  Filled 2014-09-06 (×15): qty 5

## 2014-09-06 NOTE — Progress Notes (Signed)
Inpatient Diabetes Program Recommendations  AACE/ADA: New Consensus Statement on Inpatient Glycemic Control (2013)  Target Ranges:  Prepandial:   less than 140 mg/dL      Peak postprandial:   less than 180 mg/dL (1-2 hours)      Critically ill patients:  140 - 180 mg/dL   Results for VEASNA, GOROSPE (MRN [id number]) as of 09/06/2014 10:06  Ref. Range 09/05/2014 07:27 09/05/2014 11:48 09/05/2014 16:43 09/05/2014 20:36 09/06/2014 08:07  Glucose-Capillary Latest Range: 70-99 mg/dL 235 (H) 573 (H) 220 (H) 234 (H) 168 (H)    Diabetes history: DM2 Outpatient Diabetes medications: Metformin 500 mg BID Current orders for Inpatient glycemic control: Novolog 0-9 units TID with meals  Inpatient Diabetes Program Recommendations Correction (SSI): Please consider ordering Novolog bedtime correction scale.   Thanks, Orlando Penner, RN, MSN, CCRN, CDE Diabetes Coordinator Inpatient Diabetes Program [phone] (Team Pager from 8am to 5pm) [phone] ([facility] office) [phone] [facility] office)

## 2014-09-06 NOTE — Progress Notes (Signed)
Consulting cardiologist: Dr. Jonelle Sidle  Seen for followup: Atrial fibrillation with RVR  Subjective:    Patient states that he slept well, has had some chest congestion and mild cough. No abdominal pain, nausea, emesis. No palpitations.  Objective:   Temp:  [98.1 F (36.7 C)-98.3 F (36.8 C)] 98.1 F (36.7 C) (04/12 0430) Pulse Rate:  [36-186] 84 (04/12 0615) Resp:  [12-25] 20 (04/12 0615) BP: (96-144)/(46-89) 115/68 mmHg (04/12 0615) SpO2:  [91 %-100 %] 97 % (04/12 0615) Weight:  [199 lb (90.266 kg)] 199 lb (90.266 kg) (04/11 1009) Last BM Date: 09/04/14  Filed Weights   09/02/14 1855 09/02/14 2256 09/05/14 1009  Weight: 210 lb (95.255 kg) 199 lb 12.8 oz (90.629 kg) 199 lb (90.266 kg)    Intake/Output Summary (Last 24 hours) at 09/06/14 0802 Last data filed at 09/06/14 0600  Gross per 24 hour  Intake 2508.08 ml  Output      0 ml  Net 2508.08 ml    Telemetry: Atrial fibrillation in the 80s to 90s.  Exam:  General: Appears comfortable at rest.  Lungs: No rales, nonlabored breathing.  Cardiac: Irregularly irregular without gallop.  Abdomen: Decreased bowel sounds, no guarding.  Extremities: No pitting edema.  Lab Results:  Basic Metabolic Panel:  Recent Labs Lab 09/03/14 0626 09/04/14 0451 09/05/14 1130  NA 133* 133* 135  K 4.1 4.3 4.4  CL 102 104 105  CO2 24 23 23   GLUCOSE 147* 185* 166*  BUN 22 25* 23  CREATININE 1.46* 1.54* 1.59*  CALCIUM 8.3* 8.1* 8.2*  MG  --   --  2.1    Liver Function Tests:  Recent Labs Lab 09/02/14 1905 09/03/14 0626 09/04/14 0451  AST 51* 69* 45*  ALT 31 42 38  ALKPHOS 91 86 88  BILITOT 1.1 1.2 0.8  PROT 7.5 6.6 6.4  ALBUMIN 2.9* 2.6* 2.4*    CBC:  Recent Labs Lab 09/02/14 1905 09/03/14 0626 09/04/14 0451  WBC 11.1* 10.5 11.0*  HGB 11.2* 10.2* 10.4*  HCT 34.7* 31.4* 32.1*  MCV 91.3 91.0 91.5  PLT 201 202 187    Cardiac Enzymes:  Recent Labs Lab 09/05/14 1130 09/05/14 1721  09/05/14 2235  TROPONINI 0.59* 0.51* 0.57*    ECG: Atrial fibrillation with left axis versus LAFB, incomplete right bundle branch block.   Medications:   Scheduled Medications: . enoxaparin (LOVENOX) injection  40 mg Subcutaneous Q24H  . insulin aspart  0-9 Units Subcutaneous TID WC  . metoprolol tartrate  25 mg Oral BID  . piperacillin-tazobactam (ZOSYN)  IV  3.375 g Intravenous Q8H  . pravastatin  40 mg Oral Daily     Infusions: . sodium chloride 125 mL/hr at 09/05/14 1900  . diltiazem (CARDIZEM) infusion 5 mg/hr (09/06/14 0600)     PRN Medications:  acetaminophen **OR** acetaminophen, HYDROmorphone (DILAUDID) injection, magnesium hydroxide, ondansetron **OR** ondansetron (ZOFRAN) IV   Assessment:   1. Persistent atrial fibrillation, heart rate better controlled on intravenous diltiazem and oral Lopressor. CHADSVASC score is 5, holding off on initiating anticoagulation until after surgery.  2. Elevated troponin I, most consistent with demand ischemia rather than ACS with flat trend. LVEF normal by echocardiogram, no wall motion abnormalities.  3. Cholecystitis with hepatic abscess, pending open cholecystectomy.  4. Essential hypertension, blood pressure stable.  5. PAD status post previous left above-knee popliteal to posterior tibial artery bypass 2011.   Plan/Discussion:    Increase Lopressor to 50 mg twice daily, wean off Cardizem  if possible. We will continue to follow. No further ischemic workup is planned now, and we will defer consideration for anticoagulation for stroke prophylaxis until after surgery.   Jonelle Sidle, M.D., F.A.C.C.

## 2014-09-06 NOTE — Care Management Note (Addendum)
    Page 1 of 2   09/13/2014     9:53:23 AM CARE MANAGEMENT NOTE 09/13/2014  Patient:  Warren Mcdonald, Warren Mcdonald   Account Number:  [id number]  Date Initiated:  09/06/2014  Documentation initiated by:  Kathyrn Sheriff  Subjective/Objective Assessment:   Pt is from home, lives with wife and is independent at baseline. Pt no longer drives. Pt has a cane and rollator for PRN use. Pt has no HH services. Pt plans to discharge home with self care at this point. Surgery is pending.     Action/Plan:   Anticipate need for PT eval prior to dsicharge and possible HH RN. Will cont to follow.   Anticipated DC Date:  09/11/2014   Anticipated DC Plan:  HOME/SELF CARE      DC Planning Services  CM consult      PAC Choice  DURABLE MEDICAL EQUIPMENT  HOME HEALTH   Choice offered to / List presented to:  C-1 Patient   DME arranged  BEDSIDE COMMODE      DME agency  [facility]     [facility] arranged  HH-1 RN  HH-2 PT  HH-4 NURSE'S AIDE  HH-6 SOCIAL WORKER      HH agency  [facility]   Status of service:  Completed, signed off Medicare Important Message given?  YES (If response is "NO", the following Medicare IM given date fields will be blank) Date Medicare IM given:  09/09/2014 Medicare IM given by:  Kathyrn Sheriff Date Additional Medicare IM given:  09/13/2014 Additional Medicare IM given by:  JESSICA CHILDRESS  Discharge Disposition:  HOME/SELF CARE  Per UR Regulation:  Reviewed for med. necessity/level of care/duration of stay  If discussed at Long Length of Stay Meetings, dates discussed:   09/08/2014  09/13/2014    Comments:  09/13/2014 0900 Kathyrn Sheriff, RN, MSN, CM Pt has refused SNF (PT's recommendation) Pt plans to discharge home with [facility]. Pt has chosen [facility] and DME. Alroy Bailiff of [facility] aware of referral and will obtain pt info from chat. Lorinda Creed, of [facility] notified of DME order and will obtain pt info and have BSC delivered to pt's home. No  further CM needs at the time of discharge. 09/09/2014 1150 Kathyrn Sheriff, RN, MSN, CM Pt POD1, discharge not anticipated over weekend. Pt will need PT consult prior to dishcarge. Will cont to follow. 09/06/2014 1100 Kathyrn Sheriff, RN, MSN, CM

## 2014-09-06 NOTE — Progress Notes (Signed)
Pt off diltiazem gtt @ 1130; HR in 80's -90's most of day but began to go into 120's. On-call MD notified; orders received. Will continue to monitor.

## 2014-09-06 NOTE — Progress Notes (Signed)
Subjective: He says he feels okay. He is still in atrial fibrillation but his heart rate is better controlled. Cardiology input is noted and appreciated. His troponin level has been up somewhat and it is felt that this is likely demand ischemia.  Objective: Vital signs in last 24 hours: Temp:  [98.1 F (36.7 C)-98.3 F (36.8 C)] 98.3 F (36.8 C) (04/12 0811) Pulse Rate:  [36-186] 84 (04/12 0615) Resp:  [12-25] 20 (04/12 0615) BP: (96-144)/(46-89) 115/68 mmHg (04/12 0615) SpO2:  [91 %-100 %] 97 % (04/12 0615) Weight:  [90.266 kg (199 lb)] 90.266 kg (199 lb) (04/11 1009) Weight change:  Last BM Date: 09/04/14  Intake/Output from previous day: 04/11 0701 - 04/12 0700 In: 2508.1 [I.V.:2358.1; IV Piggyback:150] Out: -   PHYSICAL EXAM General appearance: alert, cooperative and mild distress Resp: clear to auscultation bilaterally Cardio: irregularly irregular rhythm GI: soft, non-tender; bowel sounds normal; no masses,  no organomegaly Extremities: extremities normal, atraumatic, no cyanosis or edema  Lab Results:  Results for orders placed or performed during the hospital encounter of 09/02/14 (from the past 48 hour(s))  Glucose, capillary     Status: Abnormal   Collection Time: 09/04/14 11:55 AM  Result Value Ref Range   Glucose-Capillary 209 (H) 70 - 99 mg/dL   Comment 1 Notify RN   Glucose, capillary     Status: Abnormal   Collection Time: 09/04/14  4:37 PM  Result Value Ref Range   Glucose-Capillary 201 (H) 70 - 99 mg/dL   Comment 1 Notify RN   Glucose, capillary     Status: Abnormal   Collection Time: 09/04/14 10:21 PM  Result Value Ref Range   Glucose-Capillary 168 (H) 70 - 99 mg/dL   Comment 1 Notify RN    Comment 2 Document in Chart   Glucose, capillary     Status: Abnormal   Collection Time: 09/05/14  7:27 AM  Result Value Ref Range   Glucose-Capillary 169 (H) 70 - 99 mg/dL   Comment 1 Notify RN    Comment 2 Document in Chart   Troponin I (q 6hr x 3)      Status: Abnormal   Collection Time: 09/05/14 11:30 AM  Result Value Ref Range   Troponin I 0.59 (HH) <0.031 ng/mL    Comment: CRITICAL RESULT CALLED TO, READ BACK BY AND VERIFIED WITH: STONE,A AT 12:27PM ON 09/05/14 BY FESTERMAN,C        POSSIBLE MYOCARDIAL ISCHEMIA. SERIAL TESTING RECOMMENDED.   Basic metabolic panel     Status: Abnormal   Collection Time: 09/05/14 11:30 AM  Result Value Ref Range   Sodium 135 135 - 145 mmol/L   Potassium 4.4 3.5 - 5.1 mmol/L   Chloride 105 96 - 112 mmol/L   CO2 23 19 - 32 mmol/L   Glucose, Bld 166 (H) 70 - 99 mg/dL   BUN 23 6 - 23 mg/dL   Creatinine, Ser 2.82 (H) 0.50 - 1.35 mg/dL   Calcium 8.2 (L) 8.4 - 10.5 mg/dL   GFR calc non Af Amer 39 (L) >90 mL/min   GFR calc Af Amer 45 (L) >90 mL/min    Comment: (NOTE) The eGFR has been calculated using the CKD EPI equation. This calculation has not been validated in all clinical situations. eGFR's persistently <90 mL/min signify possible Chronic Kidney Disease.    Anion gap 7 5 - 15  Magnesium     Status: None   Collection Time: 09/05/14 11:30 AM  Result Value Ref Range  Magnesium 2.1 1.5 - 2.5 mg/dL  TSH     Status: None   Collection Time: 09/05/14 11:30 AM  Result Value Ref Range   TSH 1.682 0.350 - 4.500 uIU/mL  Glucose, capillary     Status: Abnormal   Collection Time: 09/05/14 11:48 AM  Result Value Ref Range   Glucose-Capillary 140 (H) 70 - 99 mg/dL  Glucose, capillary     Status: Abnormal   Collection Time: 09/05/14  4:43 PM  Result Value Ref Range   Glucose-Capillary 229 (H) 70 - 99 mg/dL  Troponin I (q 6hr x 3)     Status: Abnormal   Collection Time: 09/05/14  5:21 PM  Result Value Ref Range   Troponin I 0.51 (HH) <0.031 ng/mL    Comment:        POSSIBLE MYOCARDIAL ISCHEMIA. SERIAL TESTING RECOMMENDED. CRITICAL VALUE NOTED.  VALUE IS CONSISTENT WITH PREVIOUSLY REPORTED AND CALLED VALUE.   Glucose, capillary     Status: Abnormal   Collection Time: 09/05/14  8:36 PM   Result Value Ref Range   Glucose-Capillary 234 (H) 70 - 99 mg/dL  Troponin I (q 6hr x 3)     Status: Abnormal   Collection Time: 09/05/14 10:35 PM  Result Value Ref Range   Troponin I 0.57 (HH) <0.031 ng/mL    Comment: RESULT REPEATED AND VERIFIED CRITICAL RESULT CALLED TO, READ BACK BY AND VERIFIED WITH: WAGNOR,R AT 2300 ON 09/05/2014 BY ISLEY,B        POSSIBLE MYOCARDIAL ISCHEMIA. SERIAL TESTING RECOMMENDED.   Glucose, capillary     Status: Abnormal   Collection Time: 09/06/14  8:07 AM  Result Value Ref Range   Glucose-Capillary 168 (H) 70 - 99 mg/dL    ABGS No results for input(s): PHART, PO2ART, TCO2, HCO3 in the last 72 hours.  Invalid input(s): PCO2 CULTURES Recent Results (from the past 240 hour(s))  Urine culture     Status: None   Collection Time: 09/03/14  9:30 AM  Result Value Ref Range Status   Specimen Description URINE, RANDOM  Final   Special Requests NONE  Final   Colony Count NO GROWTH Performed at [facility]   Final   Culture NO GROWTH Performed at [facility]   Final   Report Status 09/05/2014 FINAL  Final  Surgical pcr screen     Status: None   Collection Time: 09/03/14  5:55 PM  Result Value Ref Range Status   MRSA, PCR NEGATIVE NEGATIVE Final   Staphylococcus aureus NEGATIVE NEGATIVE Final    Comment:        The Xpert SA Assay (FDA approved for NASAL specimens in patients over 65 years of age), is one component of a comprehensive surveillance program.  Test performance has been validated by [facility] for patients greater than or equal to 8 year old. It is not intended to diagnose infection nor to guide or monitor treatment.   Culture, blood (routine x 2)     Status: None (Preliminary result)   Collection Time: 09/04/14  1:42 AM  Result Value Ref Range Status   Specimen Description RIGHT ANTECUBITAL  Final   Special Requests BOTTLES DRAWN AEROBIC AND ANAEROBIC 6CC  Final   Culture NO GROWTH 1 DAY  Final   Report  Status PENDING  Incomplete  Culture, blood (routine x 2)     Status: None (Preliminary result)   Collection Time: 09/04/14  1:50 AM  Result Value Ref Range Status   Specimen Description  BLOOD RIGHT ARM  Final   Special Requests BOTTLES DRAWN AEROBIC AND ANAEROBIC 6CC  Final   Culture NO GROWTH 1 DAY  Final   Report Status PENDING  Incomplete   Studies/Results: No results found.  Medications:  Prior to Admission:  Prescriptions prior to admission  Medication Sig Dispense Refill Last Dose  . aspirin 325 MG tablet Take 325 mg by mouth daily.     09/02/2014 at Unknown time  . metFORMIN (GLUCOPHAGE) 500 MG tablet Take 500 mg by mouth 2 (two) times daily with a meal.     09/02/2014 at Unknown time  . metoprolol tartrate (LOPRESSOR) 25 MG tablet Take 25 mg by mouth 2 (two) times daily.     09/02/2014 at 800A  . pravastatin (PRAVACHOL) 40 MG tablet Take 40 mg by mouth daily.     08/30/2014 at Unknown time   Scheduled: . enoxaparin (LOVENOX) injection  40 mg Subcutaneous Q24H  . insulin aspart  0-9 Units Subcutaneous TID WC  . metoprolol tartrate  50 mg Oral BID  . piperacillin-tazobactam (ZOSYN)  IV  3.375 g Intravenous Q8H  . pravastatin  40 mg Oral Daily   Continuous: . sodium chloride 125 mL/hr at 09/05/14 1900  . diltiazem (CARDIZEM) infusion 5 mg/hr (09/06/14 0600)   FAO:ZHYQMVHQIONGE **OR** acetaminophen, HYDROmorphone (DILAUDID) injection, magnesium hydroxide, ondansetron **OR** ondansetron (ZOFRAN) IV  Assesment: He has cholecystitis. He was set for surgery yesterday but developed atrial fibrillation with rapid ventricular response. He is better with that but still on IV diltiazem although plans are for that to be changed. He has demand ischemia and his troponin is around 0.5. Active Problems:   Hepatic abscess   Abdominal pain   DM (diabetes mellitus)    Plan: Switch to metoprolol per cardiology. Continue other treatments. Discussed with general surgery and they don't plan surgery  until probably Friday now    LOS: 4 days   Trinita Devlin L 09/06/2014, 8:38 AM

## 2014-09-06 NOTE — Progress Notes (Signed)
Patient is stable in ICU. Awaiting medicine and cardiology clearance prior to proceeding with surgery. No need for acute surgical intervention at this time.

## 2014-09-07 LAB — BASIC METABOLIC PANEL
Anion gap: 5 (ref 5–15)
BUN: 14 mg/dL (ref 6–23)
CO2: 23 mmol/L (ref 19–32)
Calcium: 8.3 mg/dL — ABNORMAL LOW (ref 8.4–10.5)
Chloride: 109 mmol/L (ref 96–112)
Creatinine, Ser: 1.26 mg/dL (ref 0.50–1.35)
GFR calc Af Amer: 60 mL/min — ABNORMAL LOW (ref >90)
GFR calc non Af Amer: 51 mL/min — ABNORMAL LOW (ref >90)
GLUCOSE: 189 mg/dL — AB (ref 70–99)
POTASSIUM: 4.1 mmol/L (ref 3.5–5.1)
SODIUM: 137 mmol/L (ref 135–145)

## 2014-09-07 LAB — GLUCOSE, CAPILLARY
GLUCOSE-CAPILLARY: 183 mg/dL — AB (ref 70–99)
GLUCOSE-CAPILLARY: 176 mg/dL — AB (ref 70–99)
Glucose-Capillary: 171 mg/dL — ABNORMAL HIGH (ref 70–99)
Glucose-Capillary: 206 mg/dL — ABNORMAL HIGH (ref 70–99)

## 2014-09-07 LAB — CBC
HCT: 30.7 % — ABNORMAL LOW (ref 39.0–52.0)
Hemoglobin: 10.1 g/dL — ABNORMAL LOW (ref 13.0–17.0)
MCH: 30 pg (ref 26.0–34.0)
MCHC: 32.9 g/dL (ref 30.0–36.0)
MCV: 91.1 fL (ref 78.0–100.0)
PLATELETS: 284 10*3/uL (ref 150–400)
RBC: 3.37 MIL/uL — ABNORMAL LOW (ref 4.22–5.81)
RDW: 15.6 % — AB (ref 11.5–15.5)
WBC: 7.8 10*3/uL (ref 4.0–10.5)

## 2014-09-07 MED ORDER — INSULIN ASPART 100 UNIT/ML ~~LOC~~ SOLN
0.0000 [IU] | Freq: Three times a day (TID) | SUBCUTANEOUS | Status: DC
  Administered 2014-09-07 – 2014-09-09 (×6): 2 [IU] via SUBCUTANEOUS
  Administered 2014-09-09: 1 [IU] via SUBCUTANEOUS
  Administered 2014-09-09: 2 [IU] via SUBCUTANEOUS
  Administered 2014-09-10: 1 [IU] via SUBCUTANEOUS
  Administered 2014-09-10 – 2014-09-11 (×3): 2 [IU] via SUBCUTANEOUS
  Administered 2014-09-11 – 2014-09-13 (×6): 1 [IU] via SUBCUTANEOUS

## 2014-09-07 MED ORDER — METOPROLOL TARTRATE 50 MG PO TABS: 50.0000 mg | ORAL_TABLET | Freq: Three times a day (TID) | ORAL | Status: DC

## 2014-09-07 MED ORDER — METOPROLOL TARTRATE 25 MG PO TABS
25.0000 mg | ORAL_TABLET | Freq: Once | ORAL | Status: AC
  Administered 2014-09-07: 25 mg via ORAL
  Filled 2014-09-07: qty 1

## 2014-09-07 MED ORDER — METOPROLOL TARTRATE 50 MG PO TABS: 75.0000 mg | ORAL_TABLET | Freq: Three times a day (TID) | ORAL | Status: DC

## 2014-09-07 MED ORDER — METOPROLOL TARTRATE 50 MG PO TABS
50.0000 mg | ORAL_TABLET | Freq: Three times a day (TID) | ORAL | Status: DC
  Administered 2014-09-07 – 2014-09-08 (×3): 50 mg via ORAL
  Filled 2014-09-07 (×3): qty 1

## 2014-09-07 MED ORDER — INSULIN ASPART 100 UNIT/ML ~~LOC~~ SOLN
0.0000 [IU] | Freq: Every day | SUBCUTANEOUS | Status: DC
  Administered 2014-09-07: 2 [IU] via SUBCUTANEOUS

## 2014-09-07 NOTE — Progress Notes (Signed)
    Subjective:  No cardiac complaints.  Objective:  Vital Signs in the last 24 hours: Temp:  [98.3 F (36.8 C)-98.7 F (37.1 C)] 98.6 F (37 C) (04/13 0400) Pulse Rate:  [78-110] 110 (04/13 0400) Resp:  [16-28] 19 (04/13 0400) BP: (98-153)/(57-110) 132/79 mmHg (04/13 0400) SpO2:  [95 %-100 %] 97 % (04/13 0400) Weight:  [231 lb 11.3 oz (105.1 kg)] 231 lb 11.3 oz (105.1 kg) (04/13 0500)  Intake/Output from previous day: 04/12 0701 - 04/13 0700 In: 3967.5 [P.O.:840; I.V.:3027.5; IV Piggyback:100] Out: -   Physical Exam: NECK: Without JVD, HJR, or bruit LUNGS: Fine crackles at the bases HEART: Iregular rate and rhythm at 120/m, no murmur, gallop, rub, bruit, thrill, or heave EXTREMITIES: Without cyanosis, clubbing, or edema   Lab Results:  Recent Labs  09/05/14 1130  NA 135  K 4.4  CL 105  CO2 23  GLUCOSE 166*  BUN 23  CREATININE 1.59*    Recent Labs  09/05/14 1721 09/05/14 2235  TROPONINI 0.51* 0.57*    Cardiac Studies: 2-D echo 09/05/14 Study Conclusions  - Left ventricle: The cavity size was normal. Wall thickness was   increased in a pattern of mild LVH. Systolic function was normal.   The estimated ejection fraction was in the range of 55% to 60%.   Wall motion was normal; there were no regional wall motion   abnormalities. Findings consistent with left ventricular   diastolic dysfunction, ungraded in the setting of atrial   fibrillation. - Aortic valve: Trileaflet; mildly thickened, mildly calcified   leaflets. There was trivial regurgitation. - Mitral valve: Calcified annulus. Loose portion of mitral chordal   structure (possible from anteromedial papillary) noted. There was   mild regurgitation. - Left atrium: The atrium was mildly dilated. - Right atrium: The atrium was mildly dilated. - Tricuspid valve: There was moderate regurgitation. - Pulmonary arteries: PA peak pressure: 41 mm Hg (S). - Pericardium, extracardiac: There was no  pericardial effusion.  Impressions:  - Mild LVH with LVEF 55-60%, features consistent with diastolic   dysfunction. Mild biatrial enlargement. 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 Sclerotic   aortic valve with trivial regurgitation. Moderate tricuspid   regurgitation with mildly increased PASP 41 mmHg.   Assessment/Plan:  1. Persistent atrial fibrillation, heart rate 120 bpm off intravenous diltiazem and oral Lopressor 50 mg BID. CHADSVASC score is 5, holding off on initiating anticoagulation until after surgery.  2. Elevated troponin I, most consistent with demand ischemia rather than ACS with flat trend. LVEF normal by echocardiogram, no wall motion abnormalities. No ischemic work up at this time.  3. Cholecystitis with hepatic abscess, pending open cholecystectomy-possibly Fri.  4. Essential hypertension, blood pressure stable.  5. PAD status post previous left above-knee popliteal to posterior tibial artery bypass 2011.      LOS: 5 days    Jacolyn Reedy PA-C 09/07/2014, 7:43 AM   Attending note:  Patient seen and examined. Discussed recent medication adjustments with nursing and reviewed heart rate overnight. We will try and achieve more optimal heart rate control with Lopressor alone, increase dose to 50 mg every 8 hours, also giving additional IV Lopressor this morning. Lopressor can be further up titrated as required and should be able to get heart rate controlled prior to surgery on 4/15. As noted previously, we are holding off on anticoagulation.  Jonelle Sidle, M.D., F.A.C.C.

## 2014-09-07 NOTE — Progress Notes (Signed)
Subjective: He says he feels okay. He has no new complaints. His breathing is pretty good. He still has some congestion. His heart rate has been somewhat elevated through the night. He is off diltiazem drip.  Objective: Vital signs in last 24 hours: Temp:  [98.3 F (36.8 C)-98.7 F (37.1 C)] 98.6 F (37 C) (04/13 0400) Pulse Rate:  [78-110] 110 (04/13 0400) Resp:  [16-28] 19 (04/13 0400) BP: (98-153)/(57-110) 132/79 mmHg (04/13 0400) SpO2:  [95 %-100 %] 97 % (04/13 0400) Weight:  [105.1 kg (231 lb 11.3 oz)] 105.1 kg (231 lb 11.3 oz) (04/13 0500) Weight change: 14.834 kg (32 lb 11.3 oz) Last BM Date: 09/06/14  Intake/Output from previous day: 04/12 0701 - 04/13 0700 In: 3967.5 [P.O.:840; I.V.:3027.5; IV Piggyback:100] Out: -   PHYSICAL EXAM General appearance: alert, cooperative and no distress Resp: clear to auscultation bilaterally Cardio: irregularly irregular rhythm GI: soft, non-tender; bowel sounds normal; no masses,  no organomegaly Extremities: extremities normal, atraumatic, no cyanosis or edema  Lab Results:  Results for orders placed or performed during the hospital encounter of 09/02/14 (from the past 48 hour(s))  Glucose, capillary     Status: Abnormal   Collection Time: 09/05/14  7:27 AM  Result Value Ref Range   Glucose-Capillary 169 (H) 70 - 99 mg/dL   Comment 1 Notify RN    Comment 2 Document in Chart   Troponin I (q 6hr x 3)     Status: Abnormal   Collection Time: 09/05/14 11:30 AM  Result Value Ref Range   Troponin I 0.59 (HH) <0.031 ng/mL    Comment: CRITICAL RESULT CALLED TO, READ BACK BY AND VERIFIED WITH: STONE,A AT 12:27PM ON 09/05/14 BY FESTERMAN,C        POSSIBLE MYOCARDIAL ISCHEMIA. SERIAL TESTING RECOMMENDED.   Basic metabolic panel     Status: Abnormal   Collection Time: 09/05/14 11:30 AM  Result Value Ref Range   Sodium 135 135 - 145 mmol/L   Potassium 4.4 3.5 - 5.1 mmol/L   Chloride 105 96 - 112 mmol/L   CO2 23 19 - 32 mmol/L    Glucose, Bld 166 (H) 70 - 99 mg/dL   BUN 23 6 - 23 mg/dL   Creatinine, Ser 1.59 (H) 0.50 - 1.35 mg/dL   Calcium 8.2 (L) 8.4 - 10.5 mg/dL   GFR calc non Af Amer 39 (L) >90 mL/min   GFR calc Af Amer 45 (L) >90 mL/min    Comment: (NOTE) The eGFR has been calculated using the CKD EPI equation. This calculation has not been validated in all clinical situations. eGFR's persistently <90 mL/min signify possible Chronic Kidney Disease.    Anion gap 7 5 - 15  Magnesium     Status: None   Collection Time: 09/05/14 11:30 AM  Result Value Ref Range   Magnesium 2.1 1.5 - 2.5 mg/dL  TSH     Status: None   Collection Time: 09/05/14 11:30 AM  Result Value Ref Range   TSH 1.682 0.350 - 4.500 uIU/mL  Glucose, capillary     Status: Abnormal   Collection Time: 09/05/14 11:48 AM  Result Value Ref Range   Glucose-Capillary 140 (H) 70 - 99 mg/dL  Glucose, capillary     Status: Abnormal   Collection Time: 09/05/14  4:43 PM  Result Value Ref Range   Glucose-Capillary 229 (H) 70 - 99 mg/dL  Troponin I (q 6hr x 3)     Status: Abnormal   Collection Time: 09/05/14  5:21  PM  Result Value Ref Range   Troponin I 0.51 (HH) <0.031 ng/mL    Comment:        POSSIBLE MYOCARDIAL ISCHEMIA. SERIAL TESTING RECOMMENDED. CRITICAL VALUE NOTED.  VALUE IS CONSISTENT WITH PREVIOUSLY REPORTED AND CALLED VALUE.   Glucose, capillary     Status: Abnormal   Collection Time: 09/05/14  8:36 PM  Result Value Ref Range   Glucose-Capillary 234 (H) 70 - 99 mg/dL  Troponin I (q 6hr x 3)     Status: Abnormal   Collection Time: 09/05/14 10:35 PM  Result Value Ref Range   Troponin I 0.57 (HH) <0.031 ng/mL    Comment: RESULT REPEATED AND VERIFIED CRITICAL RESULT CALLED TO, READ BACK BY AND VERIFIED WITH: WAGNOR,R AT 2300 ON 09/05/2014 BY ISLEY,B        POSSIBLE MYOCARDIAL ISCHEMIA. SERIAL TESTING RECOMMENDED.   Glucose, capillary     Status: Abnormal   Collection Time: 09/06/14  8:07 AM  Result Value Ref Range    Glucose-Capillary 168 (H) 70 - 99 mg/dL  Glucose, capillary     Status: Abnormal   Collection Time: 09/06/14 11:19 AM  Result Value Ref Range   Glucose-Capillary 217 (H) 70 - 99 mg/dL   Comment 1 Notify RN    Comment 2 Document in Chart   Glucose, capillary     Status: Abnormal   Collection Time: 09/06/14  4:28 PM  Result Value Ref Range   Glucose-Capillary 171 (H) 70 - 99 mg/dL   Comment 1 Notify RN    Comment 2 Document in Chart   Glucose, capillary     Status: Abnormal   Collection Time: 09/06/14  9:04 PM  Result Value Ref Range   Glucose-Capillary 200 (H) 70 - 99 mg/dL   Comment 1 Notify RN     ABGS No results for input(s): PHART, PO2ART, TCO2, HCO3 in the last 72 hours.  Invalid input(s): PCO2 CULTURES Recent Results (from the past 240 hour(s))  Urine culture     Status: None   Collection Time: 09/03/14  9:30 AM  Result Value Ref Range Status   Specimen Description URINE, RANDOM  Final   Special Requests NONE  Final   Colony Count NO GROWTH Performed at [facility]   Final   Culture NO GROWTH Performed at [facility]   Final   Report Status 09/05/2014 FINAL  Final  Surgical pcr screen     Status: None   Collection Time: 09/03/14  5:55 PM  Result Value Ref Range Status   MRSA, PCR NEGATIVE NEGATIVE Final   Staphylococcus aureus NEGATIVE NEGATIVE Final    Comment:        The Xpert SA Assay (FDA approved for NASAL specimens in patients over 71 years of age), is one component of a comprehensive surveillance program.  Test performance has been validated by [facility] for patients greater than or equal to 3 year old. It is not intended to diagnose infection nor to guide or monitor treatment.   Culture, blood (routine x 2)     Status: None (Preliminary result)   Collection Time: 09/04/14  1:42 AM  Result Value Ref Range Status   Specimen Description RIGHT ANTECUBITAL  Final   Special Requests BOTTLES DRAWN AEROBIC AND ANAEROBIC 6CC  Final    Culture NO GROWTH 2 DAYS  Final   Report Status PENDING  Incomplete  Culture, blood (routine x 2)     Status: None (Preliminary result)   Collection Time:  09/04/14  1:50 AM  Result Value Ref Range Status   Specimen Description BLOOD RIGHT ARM  Final   Special Requests BOTTLES DRAWN AEROBIC AND ANAEROBIC 6CC  Final   Culture NO GROWTH 2 DAYS  Final   Report Status PENDING  Incomplete   Studies/Results: No results found.  Medications:  Prior to Admission:  Prescriptions prior to admission  Medication Sig Dispense Refill Last Dose  . aspirin 325 MG tablet Take 325 mg by mouth daily.     09/02/2014 at Unknown time  . metFORMIN (GLUCOPHAGE) 500 MG tablet Take 500 mg by mouth 2 (two) times daily with a meal.     09/02/2014 at Unknown time  . metoprolol tartrate (LOPRESSOR) 25 MG tablet Take 25 mg by mouth 2 (two) times daily.     09/02/2014 at 800A  . pravastatin (PRAVACHOL) 40 MG tablet Take 40 mg by mouth daily.     08/30/2014 at Unknown time   Scheduled: . enoxaparin (LOVENOX) injection  40 mg Subcutaneous Q24H  . insulin aspart  0-5 Units Subcutaneous QHS  . insulin aspart  0-9 Units Subcutaneous TID WC  . metoprolol tartrate  50 mg Oral 3 times per day  . piperacillin-tazobactam (ZOSYN)  IV  3.375 g Intravenous Q8H  . pravastatin  40 mg Oral Daily   Continuous: . sodium chloride 125 mL/hr at 09/07/14 0300  . diltiazem (CARDIZEM) infusion Stopped (09/06/14 1130)   UMP:[id number] **OR** acetaminophen, guaiFENesin-dextromethorphan, HYDROmorphone (DILAUDID) injection, magnesium hydroxide, metoprolol, ondansetron **OR** ondansetron (ZOFRAN) IV  Assesment: He was admitted with abdominal pain and has acute cholecystitis. He remains on antibiotics for that. His abdomen is relatively soft now and he doesn't have any complaints of nausea or vomiting.  He has new onset atrial fibrillation with rapid ventricular response. He was actually going to the operating room when he went into atrial  fibrillation. He was initially treated with diltiazem infusion but he is now on metoprolol. However his heart rate is not totally controlled yet.  He has peripheral vascular disease but is not known to have coronary artery occlusive disease. He did have elevated troponin and this was felt to be related to demand ischemia Active Problems:   Peripheral vascular disease   Hepatic abscess   Abdominal pain   DM (diabetes mellitus)   Cholecystitis, acute   Atrial fibrillation with rapid ventricular response    Plan: Increase metoprolol to 50 mg every 8 hours. Continue other treatments. No anticoagulation yet. Plan is for surgical intervention on 09/08/2013    LOS: 5 days   Laporsche Hoeger L 09/07/2014, 7:20 AM

## 2014-09-07 NOTE — Progress Notes (Signed)
Have rescheduled open cholecystectomy for tomorrow. Patient may have cardiac meds with simple water while nothing by mouth.

## 2014-09-07 NOTE — Progress Notes (Signed)
Inpatient Diabetes Program Recommendations  AACE/ADA: New Consensus Statement on Inpatient Glycemic Control (2013)  Target Ranges:  Prepandial:   less than 140 mg/dL      Peak postprandial:   less than 180 mg/dL (1-2 hours)      Critically ill patients:  140 - 180 mg/dL   Results for MERTON, OSLER (MRN [id number]) as of 09/07/2014 09:47  Ref. Range 09/06/2014 08:07 09/06/2014 11:19 09/06/2014 16:28 09/06/2014 21:04 09/07/2014 07:37  Glucose-Capillary Latest Ref Range: 70-99 mg/dL 290 (H) 211 (H) 155 (H) 200 (H) 183 (H)   Diabetes history: DM2 Outpatient Diabetes medications: Metformin 500 mg BID Current orders for Inpatient glycemic control: Novolog 0-9 units TID with meals, Novolog 0-5 units HS  Inpatient Diabetes Program Recommendations Correction (SSI): Please consider increasing Novolog correction to moderate scale.  Thanks, Orlando Penner, RN, MSN, CCRN, CDE Diabetes Coordinator Inpatient Diabetes Program [phone] (Team Pager from 8am to 5pm) [phone] ([place] office) [phone] [facility] office)

## 2014-09-08 ENCOUNTER — Encounter (HOSPITAL_COMMUNITY)
Admission: EM | Disposition: A | Discharge status: Home Health | Payer: Self-pay | Source: Home / Self Care | Attending: Pulmonary Disease

## 2014-09-08 ENCOUNTER — Inpatient Hospital Stay (HOSPITAL_COMMUNITY): Payer: Medicare Other | Admitting: Anesthesiology

## 2014-09-08 ENCOUNTER — Encounter (HOSPITAL_COMMUNITY): Payer: Self-pay | Admitting: *Deleted

## 2014-09-08 HISTORY — PX: CHOLECYSTECTOMY: SHX55

## 2014-09-08 LAB — GLUCOSE, CAPILLARY
GLUCOSE-CAPILLARY: 191 mg/dL — AB (ref 70–99)
GLUCOSE-CAPILLARY: 124 mg/dL — AB (ref 70–99)
Glucose-Capillary: 153 mg/dL — ABNORMAL HIGH (ref 70–99)
Glucose-Capillary: 149 mg/dL — ABNORMAL HIGH (ref 70–99)
Glucose-Capillary: 163 mg/dL — ABNORMAL HIGH (ref 70–99)

## 2014-09-08 LAB — HEMOGLOBIN AND HEMATOCRIT, BLOOD
HCT: 29.3 % — ABNORMAL LOW (ref 39.0–52.0)
Hemoglobin: 9.7 g/dL — ABNORMAL LOW (ref 13.0–17.0)

## 2014-09-08 SURGERY — CHOLECYSTECTOMY
Anesthesia: General

## 2014-09-08 MED ORDER — ENOXAPARIN SODIUM 40 MG/0.4ML ~~LOC~~ SOLN
SUBCUTANEOUS | Status: AC
  Filled 2014-09-08: qty 0.4

## 2014-09-08 MED ORDER — ETOMIDATE 2 MG/ML IV SOLN
INTRAVENOUS | Status: AC
  Filled 2014-09-08: qty 10

## 2014-09-08 MED ORDER — POVIDONE-IODINE 10 % EX OINT
TOPICAL_OINTMENT | CUTANEOUS | Status: AC
  Filled 2014-09-08: qty 1

## 2014-09-08 MED ORDER — ARTIFICIAL TEARS OP OINT
TOPICAL_OINTMENT | OPHTHALMIC | Status: AC
  Filled 2014-09-08: qty 3.5

## 2014-09-08 MED ORDER — METOPROLOL TARTRATE 1 MG/ML IV SOLN
5.0000 mg | Freq: Once | INTRAVENOUS | Status: AC
  Administered 2014-09-08: 5 mg via INTRAVENOUS

## 2014-09-08 MED ORDER — SODIUM CHLORIDE 0.9 % IJ SOLN
INTRAMUSCULAR | Status: AC
  Filled 2014-09-08: qty 10

## 2014-09-08 MED ORDER — PROPOFOL 10 MG/ML IV BOLUS
INTRAVENOUS | Status: AC
  Filled 2014-09-08: qty 20

## 2014-09-08 MED ORDER — GLYCOPYRROLATE 0.2 MG/ML IJ SOLN
INTRAMUSCULAR | Status: AC
Start: 2014-09-08 — End: 2014-09-08
  Filled 2014-09-08: qty 3

## 2014-09-08 MED ORDER — 0.9 % SODIUM CHLORIDE (POUR BTL) OPTIME
TOPICAL | Status: DC | PRN
  Administered 2014-09-08: 1000 mL
  Administered 2014-09-08: 2000 mL

## 2014-09-08 MED ORDER — BUPIVACAINE HCL (PF) 0.5 % IJ SOLN
INTRAMUSCULAR | Status: AC
  Filled 2014-09-08: qty 30

## 2014-09-08 MED ORDER — NEOSTIGMINE METHYLSULFATE 10 MG/10ML IV SOLN
INTRAVENOUS | Status: DC | PRN
  Administered 2014-09-08: 4 mg via INTRAVENOUS

## 2014-09-08 MED ORDER — CHLORHEXIDINE GLUCONATE 4 % EX LIQD
1.0000 "application " | Freq: Once | CUTANEOUS | Status: AC
  Administered 2014-09-08: 1 via TOPICAL

## 2014-09-08 MED ORDER — NEOSTIGMINE METHYLSULFATE 10 MG/10ML IV SOLN
INTRAVENOUS | Status: AC
  Filled 2014-09-08: qty 2

## 2014-09-08 MED ORDER — ROCURONIUM BROMIDE 50 MG/5ML IV SOLN
INTRAVENOUS | Status: AC
  Filled 2014-09-08: qty 1

## 2014-09-08 MED ORDER — ATROPINE SULFATE 0.4 MG/ML IJ SOLN
INTRAMUSCULAR | Status: AC
  Filled 2014-09-08: qty 1

## 2014-09-08 MED ORDER — PHENYLEPHRINE 40 MCG/ML (10ML) SYRINGE FOR IV PUSH (FOR BLOOD PRESSURE SUPPORT)
PREFILLED_SYRINGE | INTRAVENOUS | Status: AC
  Filled 2014-09-08: qty 10

## 2014-09-08 MED ORDER — METOPROLOL TARTRATE 1 MG/ML IV SOLN
INTRAVENOUS | Status: AC
  Filled 2014-09-08: qty 5

## 2014-09-08 MED ORDER — FENTANYL CITRATE 0.05 MG/ML IJ SOLN
INTRAMUSCULAR | Status: AC
  Filled 2014-09-08: qty 5

## 2014-09-08 MED ORDER — ETOMIDATE 2 MG/ML IV SOLN
INTRAVENOUS | Status: DC | PRN
  Administered 2014-09-08: 4 mg via INTRAVENOUS
  Administered 2014-09-08: 12 mg via INTRAVENOUS
  Administered 2014-09-08: 2 mg via INTRAVENOUS

## 2014-09-08 MED ORDER — PHENYLEPHRINE HCL 10 MG/ML IJ SOLN
INTRAMUSCULAR | Status: DC | PRN
  Administered 2014-09-08 (×4): 40 ug via INTRAVENOUS

## 2014-09-08 MED ORDER — GLYCOPYRROLATE 0.2 MG/ML IJ SOLN
INTRAMUSCULAR | Status: DC | PRN
  Administered 2014-09-08: 0.6 mg via INTRAVENOUS

## 2014-09-08 MED ORDER — LIDOCAINE HCL (PF) 1 % IJ SOLN
INTRAMUSCULAR | Status: AC
  Filled 2014-09-08: qty 5

## 2014-09-08 MED ORDER — FENTANYL CITRATE 0.05 MG/ML IJ SOLN
INTRAMUSCULAR | Status: DC | PRN
  Administered 2014-09-08: 50 ug via INTRAVENOUS

## 2014-09-08 MED ORDER — SUCCINYLCHOLINE CHLORIDE 20 MG/ML IJ SOLN
INTRAMUSCULAR | Status: AC
  Filled 2014-09-08: qty 1

## 2014-09-08 MED ORDER — LACTATED RINGERS IV SOLN: INTRAVENOUS | Status: DC

## 2014-09-08 MED ORDER — SUCCINYLCHOLINE CHLORIDE 20 MG/ML IJ SOLN
INTRAMUSCULAR | Status: DC | PRN
  Administered 2014-09-08: 160 mg via INTRAVENOUS

## 2014-09-08 MED ORDER — ONDANSETRON HCL 4 MG/2ML IJ SOLN: 4.0000 mg | Freq: Once | INTRAMUSCULAR | Status: DC | PRN

## 2014-09-08 MED ORDER — DEXAMETHASONE SODIUM PHOSPHATE 4 MG/ML IJ SOLN
INTRAMUSCULAR | Status: AC
  Filled 2014-09-08: qty 2

## 2014-09-08 MED ORDER — FENTANYL CITRATE 0.05 MG/ML IJ SOLN
INTRAMUSCULAR | Status: AC
  Filled 2014-09-08: qty 2

## 2014-09-08 MED ORDER — BUPIVACAINE HCL (PF) 0.5 % IJ SOLN
INTRAMUSCULAR | Status: DC | PRN
  Administered 2014-09-08: 10 mL

## 2014-09-08 MED ORDER — EPHEDRINE SULFATE 50 MG/ML IJ SOLN
INTRAMUSCULAR | Status: AC
  Filled 2014-09-08: qty 1

## 2014-09-08 MED ORDER — METOPROLOL TARTRATE 50 MG PO TABS
50.0000 mg | ORAL_TABLET | Freq: Four times a day (QID) | ORAL | Status: DC
  Administered 2014-09-08 – 2014-09-11 (×11): 50 mg via ORAL
  Filled 2014-09-08 (×11): qty 1

## 2014-09-08 MED ORDER — HYDROMORPHONE HCL 1 MG/ML IJ SOLN
1.0000 mg | INTRAMUSCULAR | Status: DC | PRN
  Administered 2014-09-08 – 2014-09-09 (×2): 1 mg via INTRAVENOUS
  Filled 2014-09-08 (×2): qty 1

## 2014-09-08 MED ORDER — MIDAZOLAM HCL 2 MG/2ML IJ SOLN
1.0000 mg | INTRAMUSCULAR | Status: DC | PRN
Start: 2014-09-08 — End: 2014-09-08
  Administered 2014-09-08: 2 mg via INTRAVENOUS

## 2014-09-08 MED ORDER — GLYCOPYRROLATE 0.2 MG/ML IJ SOLN
INTRAMUSCULAR | Status: AC
  Filled 2014-09-08: qty 5

## 2014-09-08 MED ORDER — HEMOSTATIC AGENTS (NO CHARGE) OPTIME
TOPICAL | Status: DC | PRN
  Administered 2014-09-08 (×2): 1

## 2014-09-08 MED ORDER — FENTANYL CITRATE 0.05 MG/ML IJ SOLN
25.0000 ug | INTRAMUSCULAR | Status: DC | PRN
  Administered 2014-09-08 (×2): 50 ug via INTRAVENOUS
  Filled 2014-09-08: qty 2

## 2014-09-08 MED ORDER — POVIDONE-IODINE 10 % OINT PACKET
TOPICAL_OINTMENT | CUTANEOUS | Status: DC | PRN
  Administered 2014-09-08: 1 via TOPICAL

## 2014-09-08 MED ORDER — MIDAZOLAM HCL 2 MG/2ML IJ SOLN
INTRAMUSCULAR | Status: AC
  Filled 2014-09-08: qty 2

## 2014-09-08 MED ORDER — ROCURONIUM BROMIDE 100 MG/10ML IV SOLN
INTRAVENOUS | Status: DC | PRN
  Administered 2014-09-08: 10 mg via INTRAVENOUS
  Administered 2014-09-08: 15 mg via INTRAVENOUS
  Administered 2014-09-08 (×2): 5 mg via INTRAVENOUS

## 2014-09-08 MED ORDER — ONDANSETRON HCL 4 MG/2ML IJ SOLN
4.0000 mg | Freq: Once | INTRAMUSCULAR | Status: AC
  Administered 2014-09-08: 4 mg via INTRAVENOUS

## 2014-09-08 MED ORDER — ONDANSETRON HCL 4 MG/2ML IJ SOLN
INTRAMUSCULAR | Status: AC
  Filled 2014-09-08: qty 2

## 2014-09-08 SURGICAL SUPPLY — 44 items
APPLIER CLIP 11 MED OPEN (CLIP) ×3
APPLIER CLIP 13 LRG OPEN (CLIP) ×3
BAG HAMPER (MISCELLANEOUS) ×3 IMPLANT
CHLORAPREP W/TINT 26ML (MISCELLANEOUS) ×3 IMPLANT
CLIP APPLIE 11 MED OPEN (CLIP) ×1 IMPLANT
CLIP APPLIE 13 LRG OPEN (CLIP) ×1 IMPLANT
CLOTH BEACON ORANGE TIMEOUT ST (SAFETY) ×3 IMPLANT
COVER LIGHT HANDLE STERIS (MISCELLANEOUS) ×6 IMPLANT
DRAPE WARM FLUID 44X44 (DRAPE) ×3 IMPLANT
ELECT BLADE 6 FLAT ULTRCLN (ELECTRODE) ×3 IMPLANT
ELECT REM PT RETURN 9FT ADLT (ELECTROSURGICAL) ×3
ELECTRODE REM PT RTRN 9FT ADLT (ELECTROSURGICAL) ×1 IMPLANT
EVACUATOR DRAINAGE 10X20 100CC (DRAIN) ×1 IMPLANT
EVACUATOR SILICONE 100CC (DRAIN) ×2
FORMALIN 10 PREFIL 480ML (MISCELLANEOUS) ×3 IMPLANT
GAUZE SPONGE 4X4 12PLY STRL (GAUZE/BANDAGES/DRESSINGS) ×2 IMPLANT
GLOVE BIO SURGEON STRL SZ7 (GLOVE) ×3 IMPLANT
GLOVE BIOGEL PI IND STRL 7.0 (GLOVE) ×3 IMPLANT
GLOVE BIOGEL PI INDICATOR 7.0 (GLOVE) ×6
GLOVE ECLIPSE 6.5 STRL STRAW (GLOVE) ×3 IMPLANT
GLOVE SURG SS PI 7.5 STRL IVOR (GLOVE) ×6 IMPLANT
GOWN STRL REUS W/TWL LRG LVL3 (GOWN DISPOSABLE) ×9 IMPLANT
HEMOSTAT SNOW SURGICEL 2X4 (HEMOSTASIS) ×6 IMPLANT
INST SET MAJOR GENERAL (KITS) ×3 IMPLANT
KIT ROOM TURNOVER APOR (KITS) ×3 IMPLANT
MANIFOLD NEPTUNE II (INSTRUMENTS) ×3 IMPLANT
NEEDLE HYPO 25X1 1.5 SAFETY (NEEDLE) ×3 IMPLANT
NS IRRIG 1000ML POUR BTL (IV SOLUTION) ×9 IMPLANT
PACK ABDOMINAL MAJOR (CUSTOM PROCEDURE TRAY) ×3 IMPLANT
PAD ARMBOARD 7.5X6 YLW CONV (MISCELLANEOUS) ×3 IMPLANT
SET BASIN LINEN APH (SET/KITS/TRAYS/PACK) ×3 IMPLANT
SPONGE DRAIN TRACH 4X4 STRL 2S (GAUZE/BANDAGES/DRESSINGS) ×3 IMPLANT
SPONGE GAUZE 4X4 12PLY (GAUZE/BANDAGES/DRESSINGS) ×3 IMPLANT
SPONGE INTESTINAL PEANUT (DISPOSABLE) ×6 IMPLANT
SPONGE SURGIFOAM ABS GEL 100 (HEMOSTASIS) IMPLANT
STAPLER VISISTAT (STAPLE) ×3 IMPLANT
SUT ETHILON 3 0 FSL (SUTURE) ×3 IMPLANT
SUT SILK 2 0 (SUTURE) ×2
SUT SILK 2-0 18XBRD TIE 12 (SUTURE) ×1 IMPLANT
SUT VIC AB 0 CT1 27 (SUTURE) ×6
SUT VIC AB 0 CT1 27XBRD ANTBC (SUTURE) ×2 IMPLANT
SUT VIC AB 0 CT1 27XCR 8 STRN (SUTURE) ×1 IMPLANT
TAPE CLOTH SURG 4X10 WHT LF (GAUZE/BANDAGES/DRESSINGS) ×3 IMPLANT
YANKAUER SUCT BULB TIP NO VENT (SUCTIONS) ×3 IMPLANT

## 2014-09-08 NOTE — Anesthesia Procedure Notes (Signed)
Procedure Name: Intubation Date/Time: 09/08/2014 12:29 PM Performed by: Franco Nones Pre-anesthesia Checklist: Patient identified, Emergency Drugs available, Suction available, Patient being monitored and Timeout performed Patient Re-evaluated:Patient Re-evaluated prior to inductionOxygen Delivery Method: Circle system utilized Preoxygenation: Pre-oxygenation with 100% oxygen Intubation Type: IV induction Laryngoscope Size: Glidescope and 3 Grade View: Grade I Tube type: Oral Tube size: 7.0 mm Number of attempts: 1 Airway Equipment and Method: Video-laryngoscopy,  Stylet and Oral airway Placement Confirmation: ETT inserted through vocal cords under direct vision,  positive ETCO2 and breath sounds checked- equal and bilateral Secured at: 22 cm Tube secured with: Tape Dental Injury: Teeth and Oropharynx as per pre-operative assessment

## 2014-09-08 NOTE — Anesthesia Preprocedure Evaluation (Signed)
Anesthesia Evaluation  Patient identified by MRN, date of birth, ID band Patient awake    Reviewed: Allergy & Precautions, NPO status , Patient's Chart, lab work & pertinent test results, reviewed documented beta blocker date and time   Airway Mallampati: II  TM Distance: >3 FB     Dental  (+) Poor Dentition, Missing, Dental Advisory Given   Pulmonary former smoker,  breath sounds clear to auscultation        Cardiovascular hypertension, Pt. on medications and Pt. on home beta blockers + Peripheral Vascular Disease + dysrhythmias Atrial Fibrillation Rhythm:Irregular Rate:Normal     Neuro/Psych    GI/Hepatic PUD,   Endo/Other  diabetes, Type 2, Oral Hypoglycemic Agents  Renal/GU      Musculoskeletal   Abdominal   Peds  Hematology   Anesthesia Other Findings   Reproductive/Obstetrics                             Anesthesia Physical Anesthesia Plan  ASA: III  Anesthesia Plan: General   Post-op Pain Management:    Induction: Intravenous  Airway Management Planned: Oral ETT  Additional Equipment:   Intra-op Plan:   Post-operative Plan: Extubation in OR  Informed Consent: I have reviewed the patients History and Physical, chart, labs and discussed the procedure including the risks, benefits and alternatives for the proposed anesthesia with the patient or authorized representative who has indicated his/her understanding and acceptance.     Plan Discussed with:   Anesthesia Plan Comments:         Anesthesia Quick Evaluation

## 2014-09-08 NOTE — Progress Notes (Signed)
Inpatient Diabetes Program Recommendations  AACE/ADA: New Consensus Statement on Inpatient Glycemic Control (2013)  Target Ranges:  Prepandial:   less than 140 mg/dL      Peak postprandial:   less than 180 mg/dL (1-2 hours)      Critically ill patients:  140 - 180 mg/dL   Results for Warren Mcdonald, GHENT (MRN [id number]) as of 09/08/2014 09:27  Ref. Range 09/07/2014 07:37 09/07/2014 11:33 09/07/2014 16:19 09/07/2014 21:32 09/08/2014 08:14  Glucose-Capillary Latest Ref Range: 70-99 mg/dL 524 (H) 818 (H) 590 (H) 206 (H) 191 (H)   Diabetes history: DM2 Outpatient Diabetes medications: Metformin 500 mg BID Current orders for Inpatient glycemic control: Novolog 0-9 units TID with meals, Novolog 0-5 units HS  Inpatient Diabetes Program Recommendations Correction (SSI): Please consider increasing Novolog correction to moderate scale.  Thanks, Orlando Penner, RN, MSN, CCRN, CDE Diabetes Coordinator Inpatient Diabetes Program [phone] (Team Pager from 8am to 5pm) [phone] ([place] office) [phone] [facility] office)

## 2014-09-08 NOTE — Progress Notes (Signed)
Received report from CRNA Adams. Patient arrived on floor about 1545. Was arousable and not in pain. Connected all monitoring cables and fluid. Patient did not have any complaints at this time.

## 2014-09-08 NOTE — Progress Notes (Signed)
Subjective: He is doing okay. He has no new complaints. His heart rate is still elevated in the 110-120 range. He has no other new complaints. His breathing is better.  Objective: Vital signs in last 24 hours: Temp:  [98 F (36.7 C)-98.8 F (37.1 C)] 98.1 F (36.7 C) (04/14 0823) Pulse Rate:  [80-123] 103 (04/14 0700) Resp:  [19-29] 21 (04/14 0700) BP: (99-150)/(61-105) 138/92 mmHg (04/14 0700) SpO2:  [94 %-100 %] 98 % (04/14 0700) Weight:  [107.2 kg (236 lb 5.3 oz)] 107.2 kg (236 lb 5.3 oz) (04/14 0500) Weight change: 2.1 kg (4 lb 10.1 oz) Last BM Date: 09/06/14  Intake/Output from previous day: 04/13 0701 - 04/14 0700 In: 4155 [P.O.:680; I.V.:3375; IV Piggyback:100] Out: -   PHYSICAL EXAM General appearance: alert, cooperative and no distress Resp: clear to auscultation bilaterally Cardio: irregularly irregular rhythm GI: soft, non-tender; bowel sounds normal; no masses,  no organomegaly Extremities: extremities normal, atraumatic, no cyanosis or edema  Lab Results:  Results for orders placed or performed during the hospital encounter of 09/02/14 (from the past 48 hour(s))  Glucose, capillary     Status: Abnormal   Collection Time: 09/06/14 11:19 AM  Result Value Ref Range   Glucose-Capillary 217 (H) 70 - 99 mg/dL   Comment 1 Notify RN    Comment 2 Document in Chart   Glucose, capillary     Status: Abnormal   Collection Time: 09/06/14  4:28 PM  Result Value Ref Range   Glucose-Capillary 171 (H) 70 - 99 mg/dL   Comment 1 Notify RN    Comment 2 Document in Chart   Glucose, capillary     Status: Abnormal   Collection Time: 09/06/14  9:04 PM  Result Value Ref Range   Glucose-Capillary 200 (H) 70 - 99 mg/dL   Comment 1 Notify RN   Glucose, capillary     Status: Abnormal   Collection Time: 09/07/14  7:37 AM  Result Value Ref Range   Glucose-Capillary 183 (H) 70 - 99 mg/dL   Comment 1 Notify RN    Comment 2 Document in Chart   Glucose, capillary     Status: Abnormal    Collection Time: 09/07/14 11:33 AM  Result Value Ref Range   Glucose-Capillary 176 (H) 70 - 99 mg/dL   Comment 1 Notify RN    Comment 2 Document in Chart   Basic metabolic panel     Status: Abnormal   Collection Time: 09/07/14 12:06 PM  Result Value Ref Range   Sodium 137 135 - 145 mmol/L   Potassium 4.1 3.5 - 5.1 mmol/L   Chloride 109 96 - 112 mmol/L   CO2 23 19 - 32 mmol/L   Glucose, Bld 189 (H) 70 - 99 mg/dL   BUN 14 6 - 23 mg/dL   Creatinine, Ser 1.26 0.50 - 1.35 mg/dL   Calcium 8.3 (L) 8.4 - 10.5 mg/dL   GFR calc non Af Amer 51 (L) >90 mL/min   GFR calc Af Amer 60 (L) >90 mL/min    Comment: (NOTE) The eGFR has been calculated using the CKD EPI equation. This calculation has not been validated in all clinical situations. eGFR's persistently <90 mL/min signify possible Chronic Kidney Disease.    Anion gap 5 5 - 15  CBC     Status: Abnormal   Collection Time: 09/07/14 12:06 PM  Result Value Ref Range   WBC 7.8 4.0 - 10.5 K/uL   RBC 3.37 (L) 4.22 - 5.81 MIL/uL  Hemoglobin 10.1 (L) 13.0 - 17.0 g/dL   HCT 30.7 (L) 39.0 - 52.0 %   MCV 91.1 78.0 - 100.0 fL   MCH 30.0 26.0 - 34.0 pg   MCHC 32.9 30.0 - 36.0 g/dL   RDW 15.6 (H) 11.5 - 15.5 %   Platelets 284 150 - 400 K/uL  Glucose, capillary     Status: Abnormal   Collection Time: 09/07/14  4:19 PM  Result Value Ref Range   Glucose-Capillary 171 (H) 70 - 99 mg/dL   Comment 1 Notify RN    Comment 2 Document in Chart   Glucose, capillary     Status: Abnormal   Collection Time: 09/07/14  9:32 PM  Result Value Ref Range   Glucose-Capillary 206 (H) 70 - 99 mg/dL  Glucose, capillary     Status: Abnormal   Collection Time: 09/08/14  8:14 AM  Result Value Ref Range   Glucose-Capillary 191 (H) 70 - 99 mg/dL   Comment 1 Notify RN    Comment 2 Document in Chart     ABGS No results for input(s): PHART, PO2ART, TCO2, HCO3 in the last 72 hours.  Invalid input(s): PCO2 CULTURES Recent Results (from the past 240 hour(s))   Urine culture     Status: None   Collection Time: 09/03/14  9:30 AM  Result Value Ref Range Status   Specimen Description URINE, RANDOM  Final   Special Requests NONE  Final   Colony Count NO GROWTH Performed at [facility]   Final   Culture NO GROWTH Performed at [facility]   Final   Report Status 09/05/2014 FINAL  Final  Surgical pcr screen     Status: None   Collection Time: 09/03/14  5:55 PM  Result Value Ref Range Status   MRSA, PCR NEGATIVE NEGATIVE Final   Staphylococcus aureus NEGATIVE NEGATIVE Final    Comment:        The Xpert SA Assay (FDA approved for NASAL specimens in patients over 64 years of age), is one component of a comprehensive surveillance program.  Test performance has been validated by [facility] for patients greater than or equal to 36 year old. It is not intended to diagnose infection nor to guide or monitor treatment.   Culture, blood (routine x 2)     Status: None (Preliminary result)   Collection Time: 09/04/14  1:42 AM  Result Value Ref Range Status   Specimen Description RIGHT ANTECUBITAL  Final   Special Requests BOTTLES DRAWN AEROBIC AND ANAEROBIC 6CC  Final   Culture NO GROWTH 3 DAYS  Final   Report Status PENDING  Incomplete  Culture, blood (routine x 2)     Status: None (Preliminary result)   Collection Time: 09/04/14  1:50 AM  Result Value Ref Range Status   Specimen Description BLOOD RIGHT ARM  Final   Special Requests BOTTLES DRAWN AEROBIC AND ANAEROBIC 6CC  Final   Culture NO GROWTH 3 DAYS  Final   Report Status PENDING  Incomplete   Studies/Results: No results found.  Medications:  Prior to Admission:  Prescriptions prior to admission  Medication Sig Dispense Refill Last Dose  . aspirin 325 MG tablet Take 325 mg by mouth daily.     09/02/2014 at Unknown time  . metFORMIN (GLUCOPHAGE) 500 MG tablet Take 500 mg by mouth 2 (two) times daily with a meal.     09/02/2014 at Unknown time  . metoprolol tartrate  (LOPRESSOR) 25 MG tablet Take 25  mg by mouth 2 (two) times daily.     09/02/2014 at 800A  . pravastatin (PRAVACHOL) 40 MG tablet Take 40 mg by mouth daily.     08/30/2014 at Unknown time   Scheduled: . enoxaparin (LOVENOX) injection  40 mg Subcutaneous Q24H  . insulin aspart  0-5 Units Subcutaneous QHS  . insulin aspart  0-9 Units Subcutaneous TID WC  . metoprolol tartrate  50 mg Oral 3 times per day  . piperacillin-tazobactam (ZOSYN)  IV  3.375 g Intravenous Q8H  . pravastatin  40 mg Oral Daily   Continuous: . sodium chloride 125 mL/hr at 09/08/14 0701  . diltiazem (CARDIZEM) infusion Stopped (09/06/14 1130)   GHW:EXHBZJIRCVELF **OR** acetaminophen, guaiFENesin-dextromethorphan, HYDROmorphone (DILAUDID) injection, magnesium hydroxide, metoprolol, ondansetron **OR** ondansetron (ZOFRAN) IV  Assesment: He has atrial fibrillation with rapid ventricular response. This is better but he still has heart rates over 100 a significant amount of time. He is now on a total of 150 mg of metoprolol.  He has cholecystitis and needs a cholecystectomy  He is known to have peripheral vascular disease it seems to be stable. Active Problems:   Peripheral vascular disease   Hepatic abscess   Abdominal pain   DM (diabetes mellitus)   Cholecystitis, acute   Atrial fibrillation with rapid ventricular response    Plan: Discuss with cardiology regarding increased the dose of metoprolol or switch to amiodarone etc.    LOS: 6 days   Renata Gambino L 09/08/2014, 8:47 AM

## 2014-09-08 NOTE — Anesthesia Postprocedure Evaluation (Signed)
  Anesthesia Post-op Note  Patient: Warren Mcdonald  Procedure(s) Performed: Procedure(s): CHOLECYSTECTOMY (N/A)  Patient Location: PACU  Anesthesia Type:General  Level of Consciousness: awake, oriented and patient cooperative  Airway and Oxygen Therapy: Patient Spontanous Breathing and Patient connected to face mask oxygen  Post-op Pain: mild  Post-op Assessment: Post-op Vital signs reviewed, Patient's Cardiovascular Status Stable, Respiratory Function Stable, Patent Airway, No signs of Nausea or vomiting and Pain level controlled  Post-op Vital Signs: Reviewed and stable  Last Vitals:  Filed Vitals:   09/08/14 1445  BP: 134/72  Pulse: 101  Temp:   Resp: 19    Complications: No apparent anesthesia complications

## 2014-09-08 NOTE — Op Note (Signed)
Patient:  Warren Mcdonald  DOB:  1929-08-04  MRN:  [id number]   Preop Diagnosis:  Cholecystitis, cholelithiasis  Postop Diagnosis:  Gangrene of gallbladder  Procedure:  Open cholecystectomy  Surgeon:  Franky Macho, M.D.  Anes:  Gen. endotracheal  Indications:  Patient is an 82 year old black male who presented to [facility] worsening right upper quadrant abdominal pain. He was found to have acute cholecystitis with cholelithiasis, and a question of an associated liver abscess. He was initially.be operated on earlier this week, but he went into rapid atrial fibrillation prior to surgery. Cardiology had been consulted. He now presents for an open cholecystectomy. Risks and benefits of the procedure including bleeding, infection, cardiopulmonary difficulties, and the possibility of a blood transfusion were fully explained to the patient, who gave informed consent.  Procedure note:  The patient was placed the supine position. After induction of general endotracheal anesthesia, the abdomen was prepped and draped using usual sterile technique with DuraPrep. Surgical site confirmation was performed.  A right subcostal incision was made down to the peritoneal cavity without difficulty. The peritoneal cavity was entered into and a small amount of dural and fluid was noted around the gallbladder. Aerobic and anaerobic cultures were taken and sent to microbiology. The gallbladder wall was noted to be diffusely thickened, inflamed, and gangrenous. The posterior wall the gallbladder was gangrenous and closely adherent to the liver. No liver abscess was found, I suspect this was adipose tissue surrounding the gallbladder. This was freed away without difficulty. The gallbladder had to be decompressed in order to facilitate mobilization of the gallbladder. It was intrahepatic in nature. A dome down approach was performed using Bovie electrocautery and blunt dissection. A bleeding was controlled using  Bovie electrocautery. The cystic duct was ultimately identified. Its junction to the common bile duct was identified. Large clips were placed proximally and distally on the cystic duct, and the cystic duct was divided. The cystic artery was ligated using clips. The gallbladder was then removed from the operative field. The subhepatic space was copiously irrigated normal saline. Surgicel is placed the gallbladder fossa. A #10 flat Jackson-Pratt drain was placed into the subhepatic space. The drain was brought through a separate stab wound and secured at the skin level using a 3-0 nylon interrupted suture. The fascia was reapproximated using a running 0 Vicryl suture. The anterior fascial layer was reapproximated using 0 Vicryl interrupted sutures. The subcutaneous layer was irrigated normal saline. The skin was closed using staples. 0.5% Sensorcaine was instilled the surrounding wound. Betadine ointment and a dry sterile dressing were applied.  All tape and needle counts were correct at the end of the procedure. Patient was extubated in the operating room and transferred to PACU in stable condition.  Complications:  None  EBL:  500 mL  Specimen:  Gallbladder, intra-abdominal abscess for aerobic and anaerobic cultures  Drains: JP drain to subhepatic space

## 2014-09-08 NOTE — Progress Notes (Signed)
Patient taken down to surgery around 1110. Transported on bed with iv's off pump.

## 2014-09-08 NOTE — Transfer of Care (Signed)
Immediate Anesthesia Transfer of Care Note  Patient: Warren Mcdonald  Procedure(s) Performed: Procedure(s): CHOLECYSTECTOMY (N/A)  Patient Location: PACU  Anesthesia Type:General  Level of Consciousness: sedated and patient cooperative  Airway & Oxygen Therapy: Patient Spontanous Breathing and Patient connected to face mask oxygen  Post-op Assessment: Report given to RN and Post -op Vital signs reviewed and stable  Post vital signs: Reviewed and stable  Last Vitals:  Filed Vitals:   09/08/14 1200  BP: 148/93  Pulse:   Temp:   Resp: 22    Complications: No apparent anesthesia complications

## 2014-09-08 NOTE — Progress Notes (Signed)
Consulting cardiologist: Dr. Jonelle Sidle  Seen for followup: Atrial fibrillation  Subjective:    No specific complaint this morning. Not aware of any palpitations or chest pain.  Objective:   Temp:  [98 F (36.7 C)-98.8 F (37.1 C)] 98.1 F (36.7 C) (04/14 0823) Pulse Rate:  [80-123] 103 (04/14 0700) Resp:  [19-29] 21 (04/14 0700) BP: (99-150)/(61-105) 138/92 mmHg (04/14 0700) SpO2:  [94 %-100 %] 98 % (04/14 0700) Weight:  [236 lb 5.3 oz (107.2 kg)] 236 lb 5.3 oz (107.2 kg) (04/14 0500) Last BM Date: 09/06/14  Filed Weights   09/05/14 1009 09/07/14 0500 09/08/14 0500  Weight: 199 lb (90.266 kg) 231 lb 11.3 oz (105.1 kg) 236 lb 5.3 oz (107.2 kg)    Intake/Output Summary (Last 24 hours) at 09/08/14 0846 Last data filed at 09/08/14 0600  Gross per 24 hour  Intake   4155 ml  Output      0 ml  Net   4155 ml    Telemetry: Persistent atrial fibrillation with heart rate ranging 90-110.  Exam:  General: No distress.  Lungs: Clear with decreased breath sounds at the bases.  Cardiac: Irregularly irregular without gallop.  Abdomen: NABS.  Extremities: No pitting edema.  Lab Results:  Basic Metabolic Panel:  Recent Labs Lab 09/04/14 0451 09/05/14 1130 09/07/14 1206  NA 133* 135 137  K 4.3 4.4 4.1  CL 104 105 109  CO2 23 23 23   GLUCOSE 185* 166* 189*  BUN 25* 23 14  CREATININE 1.54* 1.59* 1.26  CALCIUM 8.1* 8.2* 8.3*  MG  --  2.1  --     Liver Function Tests:  Recent Labs Lab 09/02/14 1905 09/03/14 0626 09/04/14 0451  AST 51* 69* 45*  ALT 31 42 38  ALKPHOS 91 86 88  BILITOT 1.1 1.2 0.8  PROT 7.5 6.6 6.4  ALBUMIN 2.9* 2.6* 2.4*    CBC:  Recent Labs Lab 09/03/14 0626 09/04/14 0451 09/07/14 1206  WBC 10.5 11.0* 7.8  HGB 10.2* 10.4* 10.1*  HCT 31.4* 32.1* 30.7*  MCV 91.0 91.5 91.1  PLT 202 187 284    Cardiac Enzymes:  Recent Labs Lab 09/05/14 1130 09/05/14 1721 09/05/14 2235  TROPONINI 0.59* 0.51* 0.57*     Imaging:  Echocardiogram 09/06/2014: Study Conclusions  - Left ventricle: The cavity size was normal. Wall thickness was increased in a pattern of mild LVH. Systolic function was normal. The estimated ejection fraction was in the range of 55% to 60%. Wall motion was normal; there were no regional wall motion abnormalities. Findings consistent with left ventricular diastolic dysfunction, ungraded in the setting of atrial fibrillation. - Aortic valve: Trileaflet; mildly thickened, mildly calcified leaflets. There was trivial regurgitation. - Mitral valve: Calcified annulus. Loose portion of mitral chordal structure (possible from anteromedial papillary) noted. There was mild regurgitation. - Left atrium: The atrium was mildly dilated. - Right atrium: The atrium was mildly dilated. - Tricuspid valve: There was moderate regurgitation. - Pulmonary arteries: PA peak pressure: 41 mm Hg (S). - Pericardium, extracardiac: There was no pericardial effusion.  Impressions:  - Mild LVH with LVEF 55-60%, features consistent with diastolic dysfunction. Mild biatrial enlargement. 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 Sclerotic aortic valve with trivial regurgitation. Moderate tricuspid regurgitation with mildly increased PASP 41 mmHg.   Medications:   Scheduled Medications: . enoxaparin (LOVENOX) injection  40 mg Subcutaneous Q24H  . insulin aspart  0-5 Units Subcutaneous QHS  . insulin  aspart  0-9 Units Subcutaneous TID WC  . metoprolol tartrate  50 mg Oral 3 times per day  . piperacillin-tazobactam (ZOSYN)  IV  3.375 g Intravenous Q8H  . pravastatin  40 mg Oral Daily     Infusions: . sodium chloride 125 mL/hr at 09/08/14 0701  . diltiazem (CARDIZEM) infusion Stopped (09/06/14 1130)     PRN Medications:  acetaminophen **OR** acetaminophen, guaiFENesin-dextromethorphan, HYDROmorphone (DILAUDID) injection,  magnesium hydroxide, metoprolol, ondansetron **OR** ondansetron (ZOFRAN) IV   Assessment:   1. Persistent atrial fibrillation, heart rate control better but not yet optimal. CHADSVASC score is 5, holding off on initiating anticoagulation until after surgery. Beta blocker was up titrated yesterday.  2. Elevated troponin I, most consistent with demand ischemia rather than ACS with flat trend. LVEF normal by echocardiogram, no wall motion abnormalities. No ischemic work up at this time.  3. Cholecystitis with hepatic abscess, pending open cholecystectomy tomorrow.  4. Essential hypertension, blood pressure stable.  5. PAD status post previous left above-knee popliteal to posterior tibial artery bypass 2011.   Plan/Discussion:    Increase Lopressor to 50 mg every 6 hours, still has as needed IV Lopressor dosing as well. We continue to follow.   Jonelle Sidle, M.D., F.A.C.C.

## 2014-09-09 ENCOUNTER — Encounter (HOSPITAL_COMMUNITY): Payer: Self-pay | Admitting: General Surgery

## 2014-09-09 DIAGNOSIS — K810 Acute cholecystitis: Secondary | ICD-10-CM

## 2014-09-09 LAB — CBC
HEMATOCRIT: 25.2 % — AB (ref 39.0–52.0)
HEMOGLOBIN: 8.2 g/dL — AB (ref 13.0–17.0)
MCH: 29.7 pg (ref 26.0–34.0)
MCHC: 32.5 g/dL (ref 30.0–36.0)
MCV: 91.3 fL (ref 78.0–100.0)
Platelets: 338 10*3/uL (ref 150–400)
RBC: 2.76 MIL/uL — AB (ref 4.22–5.81)
RDW: 16 % — ABNORMAL HIGH (ref 11.5–15.5)
WBC: 7.1 10*3/uL (ref 4.0–10.5)

## 2014-09-09 LAB — HEPATIC FUNCTION PANEL
ALT: 55 U/L — AB (ref 0–53)
AST: 83 U/L — ABNORMAL HIGH (ref 0–37)
Albumin: 1.8 g/dL — ABNORMAL LOW (ref 3.5–5.2)
Alkaline Phosphatase: 189 U/L — ABNORMAL HIGH (ref 39–117)
BILIRUBIN DIRECT: 2.1 mg/dL — AB (ref 0.0–0.5)
Indirect Bilirubin: 1 mg/dL — ABNORMAL HIGH (ref 0.3–0.9)
TOTAL PROTEIN: 5.6 g/dL — AB (ref 6.0–8.3)
Total Bilirubin: 3.1 mg/dL — ABNORMAL HIGH (ref 0.3–1.2)

## 2014-09-09 LAB — MAGNESIUM: Magnesium: 1.5 mg/dL (ref 1.5–2.5)

## 2014-09-09 LAB — BASIC METABOLIC PANEL
ANION GAP: 8 (ref 5–15)
BUN: 16 mg/dL (ref 6–23)
CO2: 19 mmol/L (ref 19–32)
Calcium: 7.8 mg/dL — ABNORMAL LOW (ref 8.4–10.5)
Chloride: 114 mmol/L — ABNORMAL HIGH (ref 96–112)
Creatinine, Ser: 1.48 mg/dL — ABNORMAL HIGH (ref 0.50–1.35)
GFR calc Af Amer: 49 mL/min — ABNORMAL LOW (ref >90)
GFR calc non Af Amer: 42 mL/min — ABNORMAL LOW (ref >90)
GLUCOSE: 146 mg/dL — AB (ref 70–99)
Potassium: 4.1 mmol/L (ref 3.5–5.1)
SODIUM: 141 mmol/L (ref 135–145)

## 2014-09-09 LAB — GLUCOSE, CAPILLARY
Glucose-Capillary: 131 mg/dL — ABNORMAL HIGH (ref 70–99)
Glucose-Capillary: 160 mg/dL — ABNORMAL HIGH (ref 70–99)
Glucose-Capillary: 155 mg/dL — ABNORMAL HIGH (ref 70–99)
Glucose-Capillary: 145 mg/dL — ABNORMAL HIGH (ref 70–99)

## 2014-09-09 LAB — CULTURE, BLOOD (ROUTINE X 2)
CULTURE: NO GROWTH
Culture: NO GROWTH

## 2014-09-09 LAB — PHOSPHORUS: PHOSPHORUS: 3.3 mg/dL (ref 2.3–4.6)

## 2014-09-09 MED ORDER — MAGNESIUM HYDROXIDE 400 MG/5ML PO SUSP
30.0000 mL | Freq: Two times a day (BID) | ORAL | Status: DC
  Administered 2014-09-09 – 2014-09-13 (×9): 30 mL via ORAL
  Filled 2014-09-09 (×11): qty 30

## 2014-09-09 MED ORDER — DILTIAZEM HCL 30 MG PO TABS
30.0000 mg | ORAL_TABLET | Freq: Four times a day (QID) | ORAL | Status: DC
  Administered 2014-09-09 – 2014-09-11 (×8): 30 mg via ORAL
  Filled 2014-09-09 (×8): qty 1

## 2014-09-09 MED ORDER — HYDROCODONE-ACETAMINOPHEN 5-325 MG PO TABS
1.0000 | ORAL_TABLET | Freq: Four times a day (QID) | ORAL | Status: DC | PRN
  Administered 2014-09-09: 1 via ORAL
  Administered 2014-09-09: 2 via ORAL
  Administered 2014-09-10 – 2014-09-11 (×2): 1 via ORAL
  Filled 2014-09-09: qty 2
  Filled 2014-09-09 (×3): qty 1

## 2014-09-09 MED ORDER — METOPROLOL TARTRATE 1 MG/ML IV SOLN: 5.0000 mg | Freq: Four times a day (QID) | INTRAVENOUS | Status: DC | PRN

## 2014-09-09 MED ORDER — MORPHINE SULFATE 2 MG/ML IJ SOLN
2.0000 mg | INTRAMUSCULAR | Status: DC | PRN
  Administered 2014-09-09: 2 mg via INTRAVENOUS
  Filled 2014-09-09: qty 1

## 2014-09-09 NOTE — Addendum Note (Signed)
Addendum  created 09/09/14 1053 by Earleen Newport, CRNA   Modules edited: Notes Section   Notes Section:  File: [id number]

## 2014-09-09 NOTE — Progress Notes (Signed)
Consulting cardiologist: Dr. Jonelle Sidle  Seen for followup: Atrial fibrillation  Subjective:    Mild postsurgical pain this morning. Taking clears and oral medications. No chest pain or palpitations.  Objective:   Temp:  [97.9 F (36.6 C)-99.3 F (37.4 C)] 98 F (36.7 C) (04/15 0400) Pulse Rate:  [44-150] 125 (04/15 0800) Resp:  [15-29] 20 (04/15 0800) BP: (74-153)/(46-97) 121/65 mmHg (04/15 0800) SpO2:  [95 %-100 %] 100 % (04/15 0800) Weight:  [236 lb (107.049 kg)-240 lb 8.4 oz (109.1 kg)] 240 lb 8.4 oz (109.1 kg) (04/15 0500) Last BM Date: 09/06/14  Filed Weights   09/08/14 0500 09/08/14 1131 09/09/14 0500  Weight: 236 lb 5.3 oz (107.2 kg) 236 lb (107.049 kg) 240 lb 8.4 oz (109.1 kg)    Intake/Output Summary (Last 24 hours) at 09/09/14 0851 Last data filed at 09/09/14 0600  Gross per 24 hour  Intake 3566.67 ml  Output   1190 ml  Net 2376.67 ml    Telemetry: Persistent atrial fibrillation with heart rate ranging 120-130.  Exam:  General: No distress.  Lungs: Clear with decreased breath sounds at the bases.  Cardiac: Irregularly irregular without gallop.  Abdomen: Diminished but present bowel sounds.  Extremities: No pitting edema.  Lab Results:  Basic Metabolic Panel:  Recent Labs Lab 09/05/14 1130 09/07/14 1206 09/09/14 0531  NA 135 137 141  K 4.4 4.1 4.1  CL 105 109 114*  CO2 GLUCOSE 166* 189* 146*  BUN CREATININE 1.59* 1.26 1.48*  CALCIUM 8.2* 8.3* 7.8*  MG 2.1  --  1.5    Liver Function Tests:  Recent Labs Lab 09/03/14 0626 09/04/14 0451 09/09/14 0531  AST 69* 45* 83*  ALT 42 38 55*  ALKPHOS 86 88 189*  BILITOT 1.2 0.8 3.1*  PROT 6.6 6.4 5.6*  ALBUMIN 2.6* 2.4* 1.8*    CBC:  Recent Labs Lab 09/04/14 0451 09/07/14 1206 09/08/14 1435 09/09/14 0531  WBC 11.0* 7.8  --  7.1  HGB 10.4* 10.1* 9.7* 8.2*  HCT 32.1* 30.7* 29.3* 25.2*  MCV 91.5 91.1  --  91.3  PLT 187 284  --  338    Cardiac  Enzymes:  Recent Labs Lab 09/05/14 1130 09/05/14 1721 09/05/14 2235  TROPONINI 0.59* 0.51* 0.57*    Imaging:  Echocardiogram 09/06/2014: Study Conclusions  - Left ventricle: The cavity size was normal. Wall thickness was increased in a pattern of mild LVH. Systolic function was normal. The estimated ejection fraction was in the range of 55% to 60%. Wall motion was normal; there were no regional wall motion abnormalities. Findings consistent with left ventricular diastolic dysfunction, ungraded in the setting of atrial fibrillation. - Aortic valve: Trileaflet; mildly thickened, mildly calcified leaflets. There was trivial regurgitation. - Mitral valve: Calcified annulus. Loose portion of mitral chordal structure (possible from anteromedial papillary) noted. There was mild regurgitation. - Left atrium: The atrium was mildly dilated. - Right atrium: The atrium was mildly dilated. - Tricuspid valve: There was moderate regurgitation. - Pulmonary arteries: PA peak pressure: 41 mm Hg (S). - Pericardium, extracardiac: There was no pericardial effusion.  Impressions:  - Mild LVH with LVEF 55-60%, features consistent with diastolic dysfunction. Mild biatrial enlargement. 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 Sclerotic aortic valve with trivial regurgitation. Moderate tricuspid regurgitation with mildly increased PASP 41 mmHg.   Medications:   Scheduled Medications: . insulin aspart  0-5 Units Subcutaneous QHS  .  insulin aspart  0-9 Units Subcutaneous TID WC  . metoprolol tartrate  50 mg Oral Q6H  . piperacillin-tazobactam (ZOSYN)  IV  3.375 g Intravenous Q8H  . pravastatin  40 mg Oral Daily    Infusions: . sodium chloride 125 mL/hr at 09/09/14 0600  . diltiazem (CARDIZEM) infusion Stopped (09/06/14 1130)    PRN Medications: acetaminophen **OR** acetaminophen, guaiFENesin-dextromethorphan, magnesium  hydroxide, metoprolol, morphine injection, ondansetron **OR** ondansetron (ZOFRAN) IV   Assessment:   1. Persistent atrial fibrillation, heart rate control not optimal on oral metoprolol. CHADSVASC score is 5, holding off on initiating anticoagulation for now.  2. Elevated troponin I, most consistent with demand ischemia rather than ACS with flat trend. LVEF normal by echocardiogram, no wall motion abnormalities. No ischemic work up planned at this time.  3. Cholecystitis with hepatic abscess, status post open cholecystectomy on April 14 with Dr. Lovell Sheehan.  4. Essential hypertension, blood pressure stable (low with pain medications).  5. PAD status post previous left above-knee popliteal to posterior tibial artery bypass 2011.   Plan/Discussion:    Continue Lopressor to 50 mg every 6 hours. Add cardizem 30 mg every 6 hours as well. Depending on heart rate control and subsequent titration, can then aim to simplify medications to once or twice daily dosing. Hold off on anticoagulation until he has stabilized postoperatively and there are no bleeding concerns.  Jonelle Sidle, M.D., F.A.C.C.

## 2014-09-09 NOTE — Care Management Utilization Note (Signed)
UR completed 

## 2014-09-09 NOTE — Anesthesia Postprocedure Evaluation (Signed)
  Anesthesia Post-op Note  Patient: Warren Mcdonald  Procedure(s) Performed: Procedure(s): CHOLECYSTECTOMY (N/A)  Patient Location: ICU 9  Anesthesia Type:General  Level of Consciousness: awake and patient cooperative  Airway and Oxygen Therapy: Patient Spontanous Breathing and Patient connected to nasal cannula oxygen  Post-op Pain: mild  Post-op Assessment: Post-op Vital signs reviewed, Patient's Cardiovascular Status Stable, Patent Airway, No signs of Nausea or vomiting and Pain level controlled; Remains in Afib  Post-op Vital Signs: Reviewed and stable  Last Vitals:  Filed Vitals:   09/09/14 0941  BP: 110/70  Pulse: 146  Temp:   Resp:     Complications: No apparent anesthesia complications

## 2014-09-09 NOTE — Progress Notes (Signed)
ANTIBIOTIC CONSULT NOTE  Pharmacy Consult for Zosyn Indication: Intra-abdominal Infection  No Known Allergies  Patient Measurements: Height: 6\' 2"  (188 cm) Weight: 240 lb 8.4 oz (109.1 kg) IBW/kg (Calculated) : 82.2  Vital Signs: Temp: 98.9 F (37.2 C) (04/15 0918) Temp Source: Oral (04/15 0400) BP: 110/70 mmHg (04/15 0941) Pulse Rate: 146 (04/15 0941) Intake/Output from previous day: 04/14 0701 - 04/15 0700 In: 3566.7 [I.V.:3416.7; IV Piggyback:150] Out: 1190 [Urine:600; Drains:90; Blood:500] Intake/Output from this shift: Total I/O In: 387.5 [I.V.:387.5] Out: 30 [Drains:30]  Labs:  Recent Labs  09/07/14 1206 09/08/14 1435 09/09/14 0531  WBC 7.8  --  7.1  HGB 10.1* 9.7* 8.2*  PLT 284  --  338  CREATININE 1.26  --  1.48*   Estimated Creatinine Clearance: 50.6 mL/min (by C-G formula based on Cr of 1.48). No results for input(s): VANCOTROUGH, VANCOPEAK, VANCORANDOM, GENTTROUGH, GENTPEAK, GENTRANDOM, TOBRATROUGH, TOBRAPEAK, TOBRARND, AMIKACINPEAK, AMIKACINTROU, AMIKACIN in the last 72 hours.   Microbiology: Recent Results (from the past 720 hour(s))  Urine culture     Status: None   Collection Time: 09/03/14  9:30 AM  Result Value Ref Range Status   Specimen Description URINE, RANDOM  Final   Special Requests NONE  Final   Colony Count NO GROWTH Performed at [facility]   Final   Culture NO GROWTH Performed at [facility]   Final   Report Status 09/05/2014 FINAL  Final  Surgical pcr screen     Status: None   Collection Time: 09/03/14  5:55 PM  Result Value Ref Range Status   MRSA, PCR NEGATIVE NEGATIVE Final   Staphylococcus aureus NEGATIVE NEGATIVE Final    Comment:        The Xpert SA Assay (FDA approved for NASAL specimens in patients over 28 years of age), is one component of a comprehensive surveillance program.  Test performance has been validated by [facility] for patients greater than or equal to 58 year old. It is not  intended to diagnose infection nor to guide or monitor treatment.   Culture, blood (routine x 2)     Status: None   Collection Time: 09/04/14  1:42 AM  Result Value Ref Range Status   Specimen Description RIGHT ANTECUBITAL  Final   Special Requests BOTTLES DRAWN AEROBIC AND ANAEROBIC 6CC  Final   Culture NO GROWTH 5 DAYS  Final   Report Status 09/09/2014 FINAL  Final  Culture, blood (routine x 2)     Status: None   Collection Time: 09/04/14  1:50 AM  Result Value Ref Range Status   Specimen Description BLOOD RIGHT ARM  Final   Special Requests BOTTLES DRAWN AEROBIC AND ANAEROBIC 6CC  Final   Culture NO GROWTH 5 DAYS  Final   Report Status 09/09/2014 FINAL  Final  Anaerobic culture     Status: None (Preliminary result)   Collection Time: 09/08/14 12:58 PM  Result Value Ref Range Status   Specimen Description ABSCESS INTRA ABDOMINAL  Final   Special Requests ZOSYN  Final   Gram Stain PENDING  Incomplete   Culture   Final    NO ANAEROBES ISOLATED; CULTURE IN PROGRESS FOR 5 DAYS Performed at [facility]    Report Status PENDING  Incomplete   Anti-infectives    Start     Dose/Rate Route Frequency Ordered Stop   09/03/14 1000  piperacillin-tazobactam (ZOSYN) IVPB 3.375 g     3.375 g 12.5 mL/hr over 240 Minutes Intravenous Every 8 hours  09/03/14 0831     09/03/14 0600  piperacillin-tazobactam (ZOSYN) IVPB 3.375 g     3.375 g 12.5 mL/hr over 240 Minutes Intravenous  Once 09/02/14 2329 09/03/14 0944   09/02/14 2200  piperacillin-tazobactam (ZOSYN) IVPB 3.375 g     3.375 g 12.5 mL/hr over 240 Minutes Intravenous  Once 09/02/14 2149 09/02/14 2217     Assessment: 81 yo M admitted acute cholecystitis.  He was started on empiric antibiotics pending surgery.  He is now s/p cholecystectomy 4/14.   He remains afebrile with normal WBC.   Scr trending up.  Estimated CrCl > 6ml/min.   Zosyn 4/8>>  Goal of Therapy:  Eradicate infection.   Plan:  Continue Zosyn 3.375gm IV  every 8 hours. Follow-up micro data, labs, vitals. Duration of therapy per MD- consider de-escalate to oral antibiotics once appropriate.   Warren Mcdonald 09/09/2014,10:22 AM

## 2014-09-09 NOTE — Progress Notes (Signed)
1 Day Post-Op  Subjective: Patient denies any significant abdominal pain.  Objective: Vital signs in last 24 hours: Temp:  [97.9 F (36.6 C)-99.3 F (37.4 C)] 98 F (36.7 C) (04/15 0400) Pulse Rate:  [44-150] 125 (04/15 0800) Resp:  [15-29] 20 (04/15 0800) BP: (74-153)/(46-97) 121/65 mmHg (04/15 0800) SpO2:  [95 %-100 %] 100 % (04/15 0800) Weight:  [107.049 kg (236 lb)-109.1 kg (240 lb 8.4 oz)] 109.1 kg (240 lb 8.4 oz) (04/15 0500) Last BM Date: 09/06/14  Intake/Output from previous day: 04/14 0701 - 04/15 0700 In: 3566.7 [I.V.:3416.7; IV Piggyback:150] Out: 1190 [Urine:600; Drains:90; Blood:500] Intake/Output this shift:    General appearance: alert, cooperative and no distress GI: Soft, dressings dry and intact. JP drainage sanguinous in nature. Minimal bowel sounds appreciated.  Lab Results:   Recent Labs  09/07/14 1206 09/08/14 1435 09/09/14 0531  WBC 7.8  --  7.1  HGB 10.1* 9.7* 8.2*  HCT 30.7* 29.3* 25.2*  PLT 284  --  338   BMET  Recent Labs  09/07/14 1206 09/09/14 0531  NA 137 141  K 4.1 4.1  CL 109 114*  CO2 23 19  GLUCOSE 189* 146*  BUN 14 16  CREATININE 1.26 1.48*  CALCIUM 8.3* 7.8*   PT/INR No results for input(s): LABPROT, INR in the last 72 hours.  Studies/Results: No results found.  Anti-infectives: Anti-infectives    Start     Dose/Rate Route Frequency Ordered Stop   09/03/14 1000  piperacillin-tazobactam (ZOSYN) IVPB 3.375 g     3.375 g 12.5 mL/hr over 240 Minutes Intravenous Every 8 hours 09/03/14 0831     09/03/14 0600  piperacillin-tazobactam (ZOSYN) IVPB 3.375 g     3.375 g 12.5 mL/hr over 240 Minutes Intravenous  Once 09/02/14 2329 09/03/14 0944   09/02/14 2200  piperacillin-tazobactam (ZOSYN) IVPB 3.375 g     3.375 g 12.5 mL/hr over 240 Minutes Intravenous  Once 09/02/14 2149 09/02/14 2217      Assessment/Plan: s/p Procedure(s): CHOLECYSTECTOMY Impression: Relatively stable status post open cholecystectomy for  gangrenous gallbladder. Hemoglobin has dropped but that is to be expected. LFTs are abnormal, and that is well is expected. Will continue to monitor. Would continue IV antibiotic. No anticoagulation at this time. Will get patient up to chair.  LOS: 7 days    Ashwin Tibbs A 09/09/2014

## 2014-09-09 NOTE — Progress Notes (Addendum)
Subjective: He says he feels okay. He has no complaints of pain. He still has episodes of heart rate into the 130 range but he is asymptomatic from that. His gallbladder was gangrenous  Objective: Vital signs in last 24 hours: Temp:  [97.9 F (36.6 C)-99.3 F (37.4 C)] 98 F (36.7 C) (04/15 0400) Pulse Rate:  [44-150] 118 (04/15 0700) Resp:  [15-29] 18 (04/15 0700) BP: (74-153)/(46-97) 99/57 mmHg (04/15 0700) SpO2:  [95 %-100 %] 99 % (04/15 0700) Weight:  [107.049 kg (236 lb)-109.1 kg (240 lb 8.4 oz)] 109.1 kg (240 lb 8.4 oz) (04/15 0500) Weight change: -0.151 kg (-5.3 oz) Last BM Date: 09/06/14  Intake/Output from previous day: 04/14 0701 - 04/15 0700 In: 3566.7 [I.V.:3416.7; IV Piggyback:150] Out: 1190 [Urine:600; Drains:90; Blood:500]  PHYSICAL EXAM General appearance: alert, cooperative and no distress Resp: clear to auscultation bilaterally Cardio: irregularly irregular rhythm GI: Not examined secondary to surgery yesterday Extremities: extremities normal, atraumatic, no cyanosis or edema  Lab Results:  Results for orders placed or performed during the hospital encounter of 09/02/14 (from the past 48 hour(s))  Glucose, capillary     Status: Abnormal   Collection Time: 09/07/14 11:33 AM  Result Value Ref Range   Glucose-Capillary 176 (H) 70 - 99 mg/dL   Comment 1 Notify RN    Comment 2 Document in Chart   Basic metabolic panel     Status: Abnormal   Collection Time: 09/07/14 12:06 PM  Result Value Ref Range   Sodium 137 135 - 145 mmol/L   Potassium 4.1 3.5 - 5.1 mmol/L   Chloride 109 96 - 112 mmol/L   CO2 23 19 - 32 mmol/L   Glucose, Bld 189 (H) 70 - 99 mg/dL   BUN 14 6 - 23 mg/dL   Creatinine, Ser 1.26 0.50 - 1.35 mg/dL   Calcium 8.3 (L) 8.4 - 10.5 mg/dL   GFR calc non Af Amer 51 (L) >90 mL/min   GFR calc Af Amer 60 (L) >90 mL/min    Comment: (NOTE) The eGFR has been calculated using the CKD EPI equation. This calculation has not been validated in all  clinical situations. eGFR's persistently <90 mL/min signify possible Chronic Kidney Disease.    Anion gap 5 5 - 15  CBC     Status: Abnormal   Collection Time: 09/07/14 12:06 PM  Result Value Ref Range   WBC 7.8 4.0 - 10.5 K/uL   RBC 3.37 (L) 4.22 - 5.81 MIL/uL   Hemoglobin 10.1 (L) 13.0 - 17.0 g/dL   HCT 30.7 (L) 39.0 - 52.0 %   MCV 91.1 78.0 - 100.0 fL   MCH 30.0 26.0 - 34.0 pg   MCHC 32.9 30.0 - 36.0 g/dL   RDW 15.6 (H) 11.5 - 15.5 %   Platelets 284 150 - 400 K/uL  Glucose, capillary     Status: Abnormal   Collection Time: 09/07/14  4:19 PM  Result Value Ref Range   Glucose-Capillary 171 (H) 70 - 99 mg/dL   Comment 1 Notify RN    Comment 2 Document in Chart   Glucose, capillary     Status: Abnormal   Collection Time: 09/07/14  9:32 PM  Result Value Ref Range   Glucose-Capillary 206 (H) 70 - 99 mg/dL  Glucose, capillary     Status: Abnormal   Collection Time: 09/08/14  8:14 AM  Result Value Ref Range   Glucose-Capillary 191 (H) 70 - 99 mg/dL   Comment 1 Notify RN  Comment 2 Document in Chart   Glucose, capillary     Status: Abnormal   Collection Time: 09/08/14 11:27 AM  Result Value Ref Range   Glucose-Capillary 153 (H) 70 - 99 mg/dL  Glucose, capillary     Status: Abnormal   Collection Time: 09/08/14  2:34 PM  Result Value Ref Range   Glucose-Capillary 149 (H) 70 - 99 mg/dL  Hemoglobin and hematocrit, blood     Status: Abnormal   Collection Time: 09/08/14  2:35 PM  Result Value Ref Range   Hemoglobin 9.7 (L) 13.0 - 17.0 g/dL   HCT 29.3 (L) 39.0 - 52.0 %  Glucose, capillary     Status: Abnormal   Collection Time: 09/08/14  4:25 PM  Result Value Ref Range   Glucose-Capillary 163 (H) 70 - 99 mg/dL   Comment 1 Notify RN    Comment 2 Document in Chart   Glucose, capillary     Status: Abnormal   Collection Time: 09/08/14  9:46 PM  Result Value Ref Range   Glucose-Capillary 124 (H) 70 - 99 mg/dL  Basic metabolic panel     Status: Abnormal   Collection Time:  09/09/14  5:31 AM  Result Value Ref Range   Sodium 141 135 - 145 mmol/L   Potassium 4.1 3.5 - 5.1 mmol/L   Chloride 114 (H) 96 - 112 mmol/L   CO2 19 19 - 32 mmol/L   Glucose, Bld 146 (H) 70 - 99 mg/dL   BUN 16 6 - 23 mg/dL   Creatinine, Ser 1.48 (H) 0.50 - 1.35 mg/dL   Calcium 7.8 (L) 8.4 - 10.5 mg/dL   GFR calc non Af Amer 42 (L) >90 mL/min   GFR calc Af Amer 49 (L) >90 mL/min    Comment: (NOTE) The eGFR has been calculated using the CKD EPI equation. This calculation has not been validated in all clinical situations. eGFR's persistently <90 mL/min signify possible Chronic Kidney Disease.    Anion gap 8 5 - 15  Magnesium     Status: None   Collection Time: 09/09/14  5:31 AM  Result Value Ref Range   Magnesium 1.5 1.5 - 2.5 mg/dL  Phosphorus     Status: None   Collection Time: 09/09/14  5:31 AM  Result Value Ref Range   Phosphorus 3.3 2.3 - 4.6 mg/dL  CBC     Status: Abnormal   Collection Time: 09/09/14  5:31 AM  Result Value Ref Range   WBC 7.1 4.0 - 10.5 K/uL   RBC 2.76 (L) 4.22 - 5.81 MIL/uL   Hemoglobin 8.2 (L) 13.0 - 17.0 g/dL   HCT 25.2 (L) 39.0 - 52.0 %   MCV 91.3 78.0 - 100.0 fL   MCH 29.7 26.0 - 34.0 pg   MCHC 32.5 30.0 - 36.0 g/dL   RDW 16.0 (H) 11.5 - 15.5 %   Platelets 338 150 - 400 K/uL  Hepatic function panel     Status: Abnormal   Collection Time: 09/09/14  5:31 AM  Result Value Ref Range   Total Protein 5.6 (L) 6.0 - 8.3 g/dL   Albumin 1.8 (L) 3.5 - 5.2 g/dL   AST 83 (H) 0 - 37 U/L   ALT 55 (H) 0 - 53 U/L   Alkaline Phosphatase 189 (H) 39 - 117 U/L   Total Bilirubin 3.1 (H) 0.3 - 1.2 mg/dL   Bilirubin, Direct 2.1 (H) 0.0 - 0.5 mg/dL   Indirect Bilirubin 1.0 (H) 0.3 - 0.9 mg/dL  ABGS No results for input(s): PHART, PO2ART, TCO2, HCO3 in the last 72 hours.  Invalid input(s): PCO2 CULTURES Recent Results (from the past 240 hour(s))  Urine culture     Status: None   Collection Time: 09/03/14  9:30 AM  Result Value Ref Range Status   Specimen  Description URINE, RANDOM  Final   Special Requests NONE  Final   Colony Count NO GROWTH Performed at [facility]   Final   Culture NO GROWTH Performed at [facility]   Final   Report Status 09/05/2014 FINAL  Final  Surgical pcr screen     Status: None   Collection Time: 09/03/14  5:55 PM  Result Value Ref Range Status   MRSA, PCR NEGATIVE NEGATIVE Final   Staphylococcus aureus NEGATIVE NEGATIVE Final    Comment:        The Xpert SA Assay (FDA approved for NASAL specimens in patients over 6 years of age), is one component of a comprehensive surveillance program.  Test performance has been validated by [facility] for patients greater than or equal to 4 year old. It is not intended to diagnose infection nor to guide or monitor treatment.   Culture, blood (routine x 2)     Status: None   Collection Time: 09/04/14  1:42 AM  Result Value Ref Range Status   Specimen Description RIGHT ANTECUBITAL  Final   Special Requests BOTTLES DRAWN AEROBIC AND ANAEROBIC 6CC  Final   Culture NO GROWTH 5 DAYS  Final   Report Status 09/09/2014 FINAL  Final  Culture, blood (routine x 2)     Status: None   Collection Time: 09/04/14  1:50 AM  Result Value Ref Range Status   Specimen Description BLOOD RIGHT ARM  Final   Special Requests BOTTLES DRAWN AEROBIC AND ANAEROBIC 6CC  Final   Culture NO GROWTH 5 DAYS  Final   Report Status 09/09/2014 FINAL  Final   Studies/Results: No results found.  Medications:  Prior to Admission:  Prescriptions prior to admission  Medication Sig Dispense Refill Last Dose  . aspirin 325 MG tablet Take 325 mg by mouth daily.     09/02/2014 at Unknown time  . metFORMIN (GLUCOPHAGE) 500 MG tablet Take 500 mg by mouth 2 (two) times daily with a meal.     09/02/2014 at Unknown time  . metoprolol tartrate (LOPRESSOR) 25 MG tablet Take 25 mg by mouth 2 (two) times daily.     09/02/2014 at 800A  . pravastatin (PRAVACHOL) 40 MG tablet Take 40 mg by mouth  daily.     08/30/2014 at Unknown time   Scheduled: . insulin aspart  0-5 Units Subcutaneous QHS  . insulin aspart  0-9 Units Subcutaneous TID WC  . metoprolol tartrate  50 mg Oral Q6H  . piperacillin-tazobactam (ZOSYN)  IV  3.375 g Intravenous Q8H  . pravastatin  40 mg Oral Daily   Continuous: . sodium chloride 125 mL/hr at 09/09/14 0600  . diltiazem (CARDIZEM) infusion Stopped (09/06/14 1130)   ZOX:[id number] **OR** acetaminophen, guaiFENesin-dextromethorphan, HYDROmorphone (DILAUDID) injection, magnesium hydroxide, metoprolol, ondansetron **OR** ondansetron (ZOFRAN) IV  Assesment: He had acute cholecystitis and had gangrenous gallbladder. He is somewhat more anemic now but I don't think he needs a blood transfusion at this point. He still has trouble with atrial fibrillation with rapid ventricular response but that is asymptomatic.  He developed hypotension with hydromorphone last night so I switched him to morphine. Active Problems:   Peripheral vascular disease  Hepatic abscess   Abdominal pain   DM (diabetes mellitus)   Cholecystitis, acute   Atrial fibrillation with rapid ventricular response    Plan: Continue treatments. Recheck laboratory work [employer].    LOS: 7 days   Warren Mcdonald L 09/09/2014, 7:58 AM

## 2014-09-10 LAB — HEPATIC FUNCTION PANEL
ALK PHOS: 147 U/L — AB (ref 39–117)
ALT: 41 U/L (ref 0–53)
AST: 35 U/L (ref 0–37)
Albumin: 1.9 g/dL — ABNORMAL LOW (ref 3.5–5.2)
BILIRUBIN TOTAL: 1.3 mg/dL — AB (ref 0.3–1.2)
Bilirubin, Direct: 0.6 mg/dL — ABNORMAL HIGH (ref 0.0–0.5)
Indirect Bilirubin: 0.7 mg/dL (ref 0.3–0.9)
TOTAL PROTEIN: 5.9 g/dL — AB (ref 6.0–8.3)

## 2014-09-10 LAB — CBC WITH DIFFERENTIAL/PLATELET
BASOS ABS: 0.1 10*3/uL (ref 0.0–0.1)
Basophils Relative: 1 % (ref 0–1)
Eosinophils Absolute: 0.2 10*3/uL (ref 0.0–0.7)
Eosinophils Relative: 2 % (ref 0–5)
HCT: 24.9 % — ABNORMAL LOW (ref 39.0–52.0)
HEMOGLOBIN: 8.1 g/dL — AB (ref 13.0–17.0)
LYMPHS ABS: 1.2 10*3/uL (ref 0.7–4.0)
Lymphocytes Relative: 14 % (ref 12–46)
MCH: 29.9 pg (ref 26.0–34.0)
MCHC: 32.5 g/dL (ref 30.0–36.0)
MCV: 91.9 fL (ref 78.0–100.0)
MONOS PCT: 9 % (ref 3–12)
Monocytes Absolute: 0.8 10*3/uL (ref 0.1–1.0)
NEUTROS PCT: 75 % (ref 43–77)
Neutro Abs: 6.4 10*3/uL (ref 1.7–7.7)
PLATELETS: 290 10*3/uL (ref 150–400)
RBC: 2.71 MIL/uL — AB (ref 4.22–5.81)
RDW: 16.5 % — ABNORMAL HIGH (ref 11.5–15.5)
WBC: 8.5 10*3/uL (ref 4.0–10.5)

## 2014-09-10 LAB — BASIC METABOLIC PANEL
Anion gap: 7 (ref 5–15)
BUN: 20 mg/dL (ref 6–23)
CHLORIDE: 111 mmol/L (ref 96–112)
CO2: 20 mmol/L (ref 19–32)
Calcium: 8 mg/dL — ABNORMAL LOW (ref 8.4–10.5)
Creatinine, Ser: 1.4 mg/dL — ABNORMAL HIGH (ref 0.50–1.35)
GFR calc Af Amer: 52 mL/min — ABNORMAL LOW (ref >90)
GFR, EST NON AFRICAN AMERICAN: 45 mL/min — AB (ref >90)
Glucose, Bld: 162 mg/dL — ABNORMAL HIGH (ref 70–99)
Potassium: 4.4 mmol/L (ref 3.5–5.1)
SODIUM: 138 mmol/L (ref 135–145)

## 2014-09-10 LAB — GLUCOSE, CAPILLARY
Glucose-Capillary: 124 mg/dL — ABNORMAL HIGH (ref 70–99)
Glucose-Capillary: 151 mg/dL — ABNORMAL HIGH (ref 70–99)
Glucose-Capillary: 170 mg/dL — ABNORMAL HIGH (ref 70–99)

## 2014-09-10 NOTE — Progress Notes (Signed)
Subjective: He says he feels much better. He had a bowel movement and he's been able to eat. His heart rate is significantly improved  Objective: Vital signs in last 24 hours: Temp:  [98.2 F (36.8 C)-98.7 F (37.1 C)] 98.2 F (36.8 C) (04/15 2000) Pulse Rate:  [49-146] 64 (04/16 0400) Resp:  [13-22] 13 (04/16 0400) BP: (110-149)/(52-85) 136/60 mmHg (04/16 0400) SpO2:  [99 %-100 %] 100 % (04/16 0400) Weight:  [111.449 kg (245 lb 11.2 oz)] 111.449 kg (245 lb 11.2 oz) (04/16 0500) Weight change: 4.4 kg (9 lb 11.2 oz) Last BM Date: 09/09/14  Intake/Output from previous day: 04/15 0701 - 04/16 0700 In: 777.5 [P.O.:240; I.V.:387.5; IV Piggyback:150] Out: 120 [Drains:120]  PHYSICAL EXAM General appearance: alert, cooperative and no distress Resp: clear to auscultation bilaterally Cardio: irregularly irregular rhythm GI: soft, non-tender; bowel sounds normal; no masses,  no organomegaly Extremities: extremities normal, atraumatic, no cyanosis or edema  Lab Results:  Results for orders placed or performed during the hospital encounter of 09/02/14 (from the past 48 hour(s))  Glucose, capillary     Status: Abnormal   Collection Time: 09/08/14 11:27 AM  Result Value Ref Range   Glucose-Capillary 153 (H) 70 - 99 mg/dL  Culture, routine-abscess     Status: None (Preliminary result)   Collection Time: 09/08/14 12:55 PM  Result Value Ref Range   Specimen Description ABSCESS INTRA ABDOMINAL    Special Requests ZOSYN    Gram Stain      NO WBC SEEN NO SQUAMOUS EPITHELIAL CELLS SEEN NO ORGANISMS SEEN Performed at [facility]    Culture      NO GROWTH 2 DAYS Performed at [facility]    Report Status PENDING   Anaerobic culture     Status: None (Preliminary result)   Collection Time: 09/08/14 12:58 PM  Result Value Ref Range   Specimen Description ABSCESS INTRA ABDOMINAL    Special Requests ZOSYN    Gram Stain      NO WBC SEEN NO SQUAMOUS EPITHELIAL CELLS  SEEN NO ORGANISMS SEEN Performed at [facility]    Culture      NO ANAEROBES ISOLATED; CULTURE IN PROGRESS FOR 5 DAYS Performed at [facility]    Report Status PENDING   Glucose, capillary     Status: Abnormal   Collection Time: 09/08/14  2:34 PM  Result Value Ref Range   Glucose-Capillary 149 (H) 70 - 99 mg/dL  Hemoglobin and hematocrit, blood     Status: Abnormal   Collection Time: 09/08/14  2:35 PM  Result Value Ref Range   Hemoglobin 9.7 (L) 13.0 - 17.0 g/dL   HCT 29.3 (L) 39.0 - 52.0 %  Glucose, capillary     Status: Abnormal   Collection Time: 09/08/14  4:25 PM  Result Value Ref Range   Glucose-Capillary 163 (H) 70 - 99 mg/dL   Comment 1 Notify RN    Comment 2 Document in Chart   Glucose, capillary     Status: Abnormal   Collection Time: 09/08/14  9:46 PM  Result Value Ref Range   Glucose-Capillary 124 (H) 70 - 99 mg/dL  Basic metabolic panel     Status: Abnormal   Collection Time: 09/09/14  5:31 AM  Result Value Ref Range   Sodium 141 135 - 145 mmol/L   Potassium 4.1 3.5 - 5.1 mmol/L   Chloride 114 (H) 96 - 112 mmol/L   CO2 19 19 - 32 mmol/L   Glucose,  Bld 146 (H) 70 - 99 mg/dL   BUN 16 6 - 23 mg/dL   Creatinine, Ser 1.48 (H) 0.50 - 1.35 mg/dL   Calcium 7.8 (L) 8.4 - 10.5 mg/dL   GFR calc non Af Amer 42 (L) >90 mL/min   GFR calc Af Amer 49 (L) >90 mL/min    Comment: (NOTE) The eGFR has been calculated using the CKD EPI equation. This calculation has not been validated in all clinical situations. eGFR's persistently <90 mL/min signify possible Chronic Kidney Disease.    Anion gap 8 5 - 15  Magnesium     Status: None   Collection Time: 09/09/14  5:31 AM  Result Value Ref Range   Magnesium 1.5 1.5 - 2.5 mg/dL  Phosphorus     Status: None   Collection Time: 09/09/14  5:31 AM  Result Value Ref Range   Phosphorus 3.3 2.3 - 4.6 mg/dL  CBC     Status: Abnormal   Collection Time: 09/09/14  5:31 AM  Result Value Ref Range   WBC 7.1 4.0 - 10.5  K/uL   RBC 2.76 (L) 4.22 - 5.81 MIL/uL   Hemoglobin 8.2 (L) 13.0 - 17.0 g/dL   HCT 25.2 (L) 39.0 - 52.0 %   MCV 91.3 78.0 - 100.0 fL   MCH 29.7 26.0 - 34.0 pg   MCHC 32.5 30.0 - 36.0 g/dL   RDW 16.0 (H) 11.5 - 15.5 %   Platelets 338 150 - 400 K/uL  Hepatic function panel     Status: Abnormal   Collection Time: 09/09/14  5:31 AM  Result Value Ref Range   Total Protein 5.6 (L) 6.0 - 8.3 g/dL   Albumin 1.8 (L) 3.5 - 5.2 g/dL   AST 83 (H) 0 - 37 U/L   ALT 55 (H) 0 - 53 U/L   Alkaline Phosphatase 189 (H) 39 - 117 U/L   Total Bilirubin 3.1 (H) 0.3 - 1.2 mg/dL   Bilirubin, Direct 2.1 (H) 0.0 - 0.5 mg/dL   Indirect Bilirubin 1.0 (H) 0.3 - 0.9 mg/dL  Glucose, capillary     Status: Abnormal   Collection Time: 09/09/14  8:29 AM  Result Value Ref Range   Glucose-Capillary 131 (H) 70 - 99 mg/dL   Comment 1 Notify RN    Comment 2 Document in Chart   Glucose, capillary     Status: Abnormal   Collection Time: 09/09/14 11:28 AM  Result Value Ref Range   Glucose-Capillary 160 (H) 70 - 99 mg/dL   Comment 1 Notify RN    Comment 2 Document in Chart   Glucose, capillary     Status: Abnormal   Collection Time: 09/09/14  4:19 PM  Result Value Ref Range   Glucose-Capillary 155 (H) 70 - 99 mg/dL   Comment 1 Notify RN   Glucose, capillary     Status: Abnormal   Collection Time: 09/09/14  8:16 PM  Result Value Ref Range   Glucose-Capillary 145 (H) 70 - 99 mg/dL   Comment 1 Notify RN   CBC with Differential/Platelet     Status: Abnormal   Collection Time: 09/10/14  4:33 AM  Result Value Ref Range   WBC 8.5 4.0 - 10.5 K/uL   RBC 2.71 (L) 4.22 - 5.81 MIL/uL   Hemoglobin 8.1 (L) 13.0 - 17.0 g/dL   HCT 24.9 (L) 39.0 - 52.0 %   MCV 91.9 78.0 - 100.0 fL   MCH 29.9 26.0 - 34.0 pg   MCHC 32.5 30.0 -  36.0 g/dL   RDW 16.5 (H) 11.5 - 15.5 %   Platelets 290 150 - 400 K/uL   Neutrophils Relative % 75 43 - 77 %   Neutro Abs 6.4 1.7 - 7.7 K/uL   Lymphocytes Relative 14 12 - 46 %   Lymphs Abs 1.2 0.7 -  4.0 K/uL   Monocytes Relative 9 3 - 12 %   Monocytes Absolute 0.8 0.1 - 1.0 K/uL   Eosinophils Relative 2 0 - 5 %   Eosinophils Absolute 0.2 0.0 - 0.7 K/uL   Basophils Relative 1 0 - 1 %   Basophils Absolute 0.1 0.0 - 0.1 K/uL  Basic metabolic panel     Status: Abnormal   Collection Time: 09/10/14  4:33 AM  Result Value Ref Range   Sodium 138 135 - 145 mmol/L   Potassium 4.4 3.5 - 5.1 mmol/L   Chloride 111 96 - 112 mmol/L   CO2 20 19 - 32 mmol/L   Glucose, Bld 162 (H) 70 - 99 mg/dL   BUN 20 6 - 23 mg/dL   Creatinine, Ser 1.40 (H) 0.50 - 1.35 mg/dL   Calcium 8.0 (L) 8.4 - 10.5 mg/dL   GFR calc non Af Amer 45 (L) >90 mL/min   GFR calc Af Amer 52 (L) >90 mL/min    Comment: (NOTE) The eGFR has been calculated using the CKD EPI equation. This calculation has not been validated in all clinical situations. eGFR's persistently <90 mL/min signify possible Chronic Kidney Disease.    Anion gap 7 5 - 15  Hepatic function panel     Status: Abnormal   Collection Time: 09/10/14  4:33 AM  Result Value Ref Range   Total Protein 5.9 (L) 6.0 - 8.3 g/dL   Albumin 1.9 (L) 3.5 - 5.2 g/dL   AST 35 0 - 37 U/L   ALT 41 0 - 53 U/L   Alkaline Phosphatase 147 (H) 39 - 117 U/L   Total Bilirubin 1.3 (H) 0.3 - 1.2 mg/dL   Bilirubin, Direct 0.6 (H) 0.0 - 0.5 mg/dL   Indirect Bilirubin 0.7 0.3 - 0.9 mg/dL  Glucose, capillary     Status: Abnormal   Collection Time: 09/10/14  7:22 AM  Result Value Ref Range   Glucose-Capillary 124 (H) 70 - 99 mg/dL    ABGS No results for input(s): PHART, PO2ART, TCO2, HCO3 in the last 72 hours.  Invalid input(s): PCO2 CULTURES Recent Results (from the past 240 hour(s))  Urine culture     Status: None   Collection Time: 09/03/14  9:30 AM  Result Value Ref Range Status   Specimen Description URINE, RANDOM  Final   Special Requests NONE  Final   Colony Count NO GROWTH Performed at [facility]   Final   Culture NO GROWTH Performed at [facility]   Final   Report Status 09/05/2014 FINAL  Final  Surgical pcr screen     Status: None   Collection Time: 09/03/14  5:55 PM  Result Value Ref Range Status   MRSA, PCR NEGATIVE NEGATIVE Final   Staphylococcus aureus NEGATIVE NEGATIVE Final    Comment:        The Xpert SA Assay (FDA approved for NASAL specimens in patients over 81 years of age), is one component of a comprehensive surveillance program.  Test performance has been validated by [facility] for patients greater than or equal to 64 year old. It is not intended to diagnose infection nor to guide  or monitor treatment.   Culture, blood (routine x 2)     Status: None   Collection Time: 09/04/14  1:42 AM  Result Value Ref Range Status   Specimen Description RIGHT ANTECUBITAL  Final   Special Requests BOTTLES DRAWN AEROBIC AND ANAEROBIC 6CC  Final   Culture NO GROWTH 5 DAYS  Final   Report Status 09/09/2014 FINAL  Final  Culture, blood (routine x 2)     Status: None   Collection Time: 09/04/14  1:50 AM  Result Value Ref Range Status   Specimen Description BLOOD RIGHT ARM  Final   Special Requests BOTTLES DRAWN AEROBIC AND ANAEROBIC 6CC  Final   Culture NO GROWTH 5 DAYS  Final   Report Status 09/09/2014 FINAL  Final  Culture, routine-abscess     Status: None (Preliminary result)   Collection Time: 09/08/14 12:55 PM  Result Value Ref Range Status   Specimen Description ABSCESS INTRA ABDOMINAL  Final   Special Requests ZOSYN  Final   Gram Stain   Final    NO WBC SEEN NO SQUAMOUS EPITHELIAL CELLS SEEN NO ORGANISMS SEEN Performed at [facility]    Culture   Final    NO GROWTH 2 DAYS Performed at [facility]    Report Status PENDING  Incomplete  Anaerobic culture     Status: None (Preliminary result)   Collection Time: 09/08/14 12:58 PM  Result Value Ref Range Status   Specimen Description ABSCESS INTRA ABDOMINAL  Final   Special Requests ZOSYN  Final   Gram Stain   Final    NO WBC  SEEN NO SQUAMOUS EPITHELIAL CELLS SEEN NO ORGANISMS SEEN Performed at [facility]    Culture   Final    NO ANAEROBES ISOLATED; CULTURE IN PROGRESS FOR 5 DAYS Performed at [facility]    Report Status PENDING  Incomplete   Studies/Results: No results found.  Medications:  Prior to Admission:  Prescriptions prior to admission  Medication Sig Dispense Refill Last Dose  . aspirin 325 MG tablet Take 325 mg by mouth daily.     09/02/2014 at Unknown time  . metFORMIN (GLUCOPHAGE) 500 MG tablet Take 500 mg by mouth 2 (two) times daily with a meal.     09/02/2014 at Unknown time  . metoprolol tartrate (LOPRESSOR) 25 MG tablet Take 25 mg by mouth 2 (two) times daily.     09/02/2014 at 800A  . pravastatin (PRAVACHOL) 40 MG tablet Take 40 mg by mouth daily.     08/30/2014 at Unknown time   Scheduled: . diltiazem  30 mg Oral 4 times per day  . insulin aspart  0-5 Units Subcutaneous QHS  . insulin aspart  0-9 Units Subcutaneous TID WC  . magnesium hydroxide  30 mL Oral BID  . metoprolol tartrate  50 mg Oral Q6H  . piperacillin-tazobactam (ZOSYN)  IV  3.375 g Intravenous Q8H  . pravastatin  40 mg Oral Daily   Continuous:  BTD:[id number] **OR** acetaminophen, guaiFENesin-dextromethorphan, HYDROcodone-acetaminophen, magnesium hydroxide, metoprolol, morphine injection, ondansetron **OR** ondansetron (ZOFRAN) IV  Assesment: He is here from cholecystitis and had open cholecystectomy. He's doing well. He has no new complaints. His heart rate is much better. He had atrial fibrillation with rapid ventricular response but that is improved. Active Problems:   Peripheral vascular disease   Hepatic abscess   Abdominal pain   DM (diabetes mellitus)   Cholecystitis, acute   Atrial fibrillation with rapid ventricular response    Plan:  Continue current treatments. His medications can be consolidated and perhaps the timing changed but I want to be sure that he remains with a lower heart  rate for 24 hours before I do that.    LOS: 8 days   Lilyan Prete L 09/10/2014, 9:34 AM

## 2014-09-10 NOTE — Progress Notes (Signed)
Pt transferring to unit 300 today. Pt/family is aware and agreeable to transfer. Assessment is unchanged from this morning and receiving RN has been given report. Belongings sent with pt at bedside.  

## 2014-09-10 NOTE — Progress Notes (Signed)
2 Days Post-Op  Subjective: Patient feels fine. Denies any significant abdominal pain.  Objective: Vital signs in last 24 hours: Temp:  [98.2 F (36.8 C)-98.7 F (37.1 C)] 98.2 F (36.8 C) (04/15 2000) Pulse Rate:  [64-91] 64 (04/16 0400) Resp:  [13-22] 13 (04/16 0400) BP: (115-149)/(52-81) 136/60 mmHg (04/16 0400) SpO2:  [99 %-100 %] 100 % (04/16 0400) Weight:  [111.449 kg (245 lb 11.2 oz)] 111.449 kg (245 lb 11.2 oz) (04/16 0500) Last BM Date: 09/09/14  Intake/Output from previous day: 04/15 0701 - 04/16 0700 In: 777.5 [P.O.:240; I.V.:387.5; IV Piggyback:150] Out: 120 [Drains:120] Intake/Output this shift: Total I/O In: 50 [IV Piggyback:50] Out: -   General appearance: alert, cooperative and no distress GI: Soft, JP drainage decreased. No bile present. Incisions healing well.  Lab Results:   Recent Labs  09/09/14 0531 09/10/14 0433  WBC 7.1 8.5  HGB 8.2* 8.1*  HCT 25.2* 24.9*  PLT 338 290   BMET  Recent Labs  09/09/14 0531 09/10/14 0433  NA 141 138  K 4.1 4.4  CL 114* 111  CO2 19 20  GLUCOSE 146* 162*  BUN 16 20  CREATININE 1.48* 1.40*  CALCIUM 7.8* 8.0*   PT/INR No results for input(s): LABPROT, INR in the last 72 hours.  Studies/Results: No results found.  Anti-infectives: Anti-infectives    Start     Dose/Rate Route Frequency Ordered Stop   09/03/14 1000  piperacillin-tazobactam (ZOSYN) IVPB 3.375 g     3.375 g 12.5 mL/hr over 240 Minutes Intravenous Every 8 hours 09/03/14 0831     09/03/14 0600  piperacillin-tazobactam (ZOSYN) IVPB 3.375 g     3.375 g 12.5 mL/hr over 240 Minutes Intravenous  Once 09/02/14 2329 09/03/14 0944   09/02/14 2200  piperacillin-tazobactam (ZOSYN) IVPB 3.375 g     3.375 g 12.5 mL/hr over 240 Minutes Intravenous  Once 09/02/14 2149 09/02/14 2217      Assessment/Plan: s/p Procedure(s): CHOLECYSTECTOMY Impression: Stable on postoperative day 2. Liver enzyme tests are normalizing. Hemoglobin relatively stable.  Will advance diet as tolerated. Agree with transfer to regular floor.  LOS: 8 days    Momoko Slezak A 09/10/2014

## 2014-09-11 LAB — CBC
HCT: 24.9 % — ABNORMAL LOW (ref 39.0–52.0)
HEMOGLOBIN: 8.2 g/dL — AB (ref 13.0–17.0)
MCH: 30.1 pg (ref 26.0–34.0)
MCHC: 32.9 g/dL (ref 30.0–36.0)
MCV: 91.5 fL (ref 78.0–100.0)
Platelets: 331 10*3/uL (ref 150–400)
RBC: 2.72 MIL/uL — ABNORMAL LOW (ref 4.22–5.81)
RDW: 16.5 % — AB (ref 11.5–15.5)
WBC: 8.8 10*3/uL (ref 4.0–10.5)

## 2014-09-11 LAB — BASIC METABOLIC PANEL
Anion gap: 6 (ref 5–15)
BUN: 20 mg/dL (ref 6–23)
CALCIUM: 8.3 mg/dL — AB (ref 8.4–10.5)
CO2: 22 mmol/L (ref 19–32)
Chloride: 110 mmol/L (ref 96–112)
Creatinine, Ser: 1.35 mg/dL (ref 0.50–1.35)
GFR calc non Af Amer: 47 mL/min — ABNORMAL LOW (ref >90)
GFR, EST AFRICAN AMERICAN: 55 mL/min — AB (ref >90)
Glucose, Bld: 181 mg/dL — ABNORMAL HIGH (ref 70–99)
Potassium: 4.2 mmol/L (ref 3.5–5.1)
SODIUM: 138 mmol/L (ref 135–145)

## 2014-09-11 LAB — GLUCOSE, CAPILLARY
Glucose-Capillary: 163 mg/dL — ABNORMAL HIGH (ref 70–99)
Glucose-Capillary: 163 mg/dL — ABNORMAL HIGH (ref 70–99)
Glucose-Capillary: 130 mg/dL — ABNORMAL HIGH (ref 70–99)
Glucose-Capillary: 166 mg/dL — ABNORMAL HIGH (ref 70–99)

## 2014-09-11 LAB — HEPATIC FUNCTION PANEL
ALK PHOS: 141 U/L — AB (ref 39–117)
ALT: 33 U/L (ref 0–53)
AST: 24 U/L (ref 0–37)
Albumin: 2 g/dL — ABNORMAL LOW (ref 3.5–5.2)
BILIRUBIN INDIRECT: 0.5 mg/dL (ref 0.3–0.9)
Bilirubin, Direct: 0.3 mg/dL (ref 0.0–0.5)
TOTAL PROTEIN: 6.4 g/dL (ref 6.0–8.3)
Total Bilirubin: 0.8 mg/dL (ref 0.3–1.2)

## 2014-09-11 LAB — CULTURE, ROUTINE-ABSCESS
CULTURE: NO GROWTH
Gram Stain: NONE SEEN

## 2014-09-11 MED ORDER — DILTIAZEM HCL ER COATED BEADS 120 MG PO CP24
120.0000 mg | ORAL_CAPSULE | Freq: Every day | ORAL | Status: DC
  Administered 2014-09-11 – 2014-09-12 (×2): 120 mg via ORAL
  Filled 2014-09-11 (×2): qty 1

## 2014-09-11 MED ORDER — METOPROLOL TARTRATE 50 MG PO TABS
100.0000 mg | ORAL_TABLET | Freq: Two times a day (BID) | ORAL | Status: DC
  Administered 2014-09-11 – 2014-09-13 (×4): 100 mg via ORAL
  Filled 2014-09-11 (×4): qty 2

## 2014-09-11 NOTE — Progress Notes (Signed)
3 Days Post-Op  Subjective: Patient feels fine. Tolerating regular diet well. Minimal incisional pain.  Objective: Vital signs in last 24 hours: Temp:  [97.7 F (36.5 C)-98.9 F (37.2 C)] 97.9 F (36.6 C) (04/17 0536) Pulse Rate:  [53-68] 61 (04/17 0536) Resp:  [15-20] 17 (04/17 0536) BP: (100-139)/(52-62) 121/55 mmHg (04/17 0824) SpO2:  [93 %-98 %] 94 % (04/17 0536) FiO2 (%):  [2 %] 2 % (04/16 2126) Weight:  [108.138 kg (238 lb 6.4 oz)] 108.138 kg (238 lb 6.4 oz) (04/17 0536) Last BM Date: 09/09/14  Intake/Output from previous day: 04/16 0701 - 04/17 0700 In: 340 [P.O.:240; IV Piggyback:100] Out: 925 [Urine:800; Drains:125] Intake/Output this shift: Total I/O In: -  Out: 200 [Urine:200]  General appearance: alert, cooperative and no distress GI: Soft, JP drainage serosanguineous in nature. Dressings dry and intact.  Lab Results:   Recent Labs  09/10/14 0433 09/11/14 0617  WBC 8.5 8.8  HGB 8.1* 8.2*  HCT 24.9* 24.9*  PLT 290 331   BMET  Recent Labs  09/10/14 0433 09/11/14 0617  NA 138 138  K 4.4 4.2  CL 111 110  CO2 20 22  GLUCOSE 162* 181*  BUN 20 20  CREATININE 1.40* 1.35  CALCIUM 8.0* 8.3*   PT/INR No results for input(s): LABPROT, INR in the last 72 hours.  Studies/Results: No results found.  Anti-infectives: Anti-infectives    Start     Dose/Rate Route Frequency Ordered Stop   09/03/14 1000  piperacillin-tazobactam (ZOSYN) IVPB 3.375 g     3.375 g 12.5 mL/hr over 240 Minutes Intravenous Every 8 hours 09/03/14 0831     09/03/14 0600  piperacillin-tazobactam (ZOSYN) IVPB 3.375 g     3.375 g 12.5 mL/hr over 240 Minutes Intravenous  Once 09/02/14 2329 09/03/14 0944   09/02/14 2200  piperacillin-tazobactam (ZOSYN) IVPB 3.375 g     3.375 g 12.5 mL/hr over 240 Minutes Intravenous  Once 09/02/14 2149 09/02/14 2217      Assessment/Plan: s/p Procedure(s): CHOLECYSTECTOMY Impression: Patient doing remarkably well status post open  cholecystectomy. His liver enzyme tests have normalized. Anticipate discharge from surgery standpoint in next 24-48 hours. Will reassess in [email or web address]. whether his drain can be removed. Would not anticoagulate yet until JP drain removed.  LOS: 9 days    Warren Mcdonald 09/11/2014

## 2014-09-11 NOTE — Progress Notes (Signed)
Subjective: He says he feels well. He has no complaints.  Objective: Vital signs in last 24 hours: Temp:  [97.7 F (36.5 C)-98.9 F (37.2 C)] 97.9 F (36.6 C) (04/17 0536) Pulse Rate:  [53-68] 61 (04/17 0536) Resp:  [15-22] 17 (04/17 0536) BP: (100-142)/(52-71) 131/62 mmHg (04/17 0536) SpO2:  [93 %-100 %] 94 % (04/17 0536) FiO2 (%):  [2 %] 2 % (04/16 2126) Weight:  [108.138 kg (238 lb 6.4 oz)] 108.138 kg (238 lb 6.4 oz) (04/17 0536) Weight change: -3.311 kg (-7 lb 4.8 oz) Last BM Date: 09/09/14  Intake/Output from previous day: 04/16 0701 - 04/17 0700 In: 340 [P.O.:240; IV Piggyback:100] Out: 925 [Urine:800; Drains:125]  PHYSICAL EXAM General appearance: alert, cooperative and no distress Resp: clear to auscultation bilaterally Cardio: regular rate and rhythm, S1, S2 normal, no murmur, click, rub or gallop GI: soft, non-tender; bowel sounds normal; no masses,  no organomegaly Extremities: extremities normal, atraumatic, no cyanosis or edema  Lab Results:  Results for orders placed or performed during the hospital encounter of 09/02/14 (from the past 48 hour(s))  Glucose, capillary     Status: Abnormal   Collection Time: 09/09/14  8:29 AM  Result Value Ref Range   Glucose-Capillary 131 (H) 70 - 99 mg/dL   Comment 1 Notify RN    Comment 2 Document in Chart   Glucose, capillary     Status: Abnormal   Collection Time: 09/09/14 11:28 AM  Result Value Ref Range   Glucose-Capillary 160 (H) 70 - 99 mg/dL   Comment 1 Notify RN    Comment 2 Document in Chart   Glucose, capillary     Status: Abnormal   Collection Time: 09/09/14  4:19 PM  Result Value Ref Range   Glucose-Capillary 155 (H) 70 - 99 mg/dL   Comment 1 Notify RN   Glucose, capillary     Status: Abnormal   Collection Time: 09/09/14  8:16 PM  Result Value Ref Range   Glucose-Capillary 145 (H) 70 - 99 mg/dL   Comment 1 Notify RN   CBC with Differential/Platelet     Status: Abnormal   Collection Time: 09/10/14   4:33 AM  Result Value Ref Range   WBC 8.5 4.0 - 10.5 K/uL   RBC 2.71 (L) 4.22 - 5.81 MIL/uL   Hemoglobin 8.1 (L) 13.0 - 17.0 g/dL   HCT 24.9 (L) 39.0 - 52.0 %   MCV 91.9 78.0 - 100.0 fL   MCH 29.9 26.0 - 34.0 pg   MCHC 32.5 30.0 - 36.0 g/dL   RDW 16.5 (H) 11.5 - 15.5 %   Platelets 290 150 - 400 K/uL   Neutrophils Relative % 75 43 - 77 %   Neutro Abs 6.4 1.7 - 7.7 K/uL   Lymphocytes Relative 14 12 - 46 %   Lymphs Abs 1.2 0.7 - 4.0 K/uL   Monocytes Relative 9 3 - 12 %   Monocytes Absolute 0.8 0.1 - 1.0 K/uL   Eosinophils Relative 2 0 - 5 %   Eosinophils Absolute 0.2 0.0 - 0.7 K/uL   Basophils Relative 1 0 - 1 %   Basophils Absolute 0.1 0.0 - 0.1 K/uL  Basic metabolic panel     Status: Abnormal   Collection Time: 09/10/14  4:33 AM  Result Value Ref Range   Sodium 138 135 - 145 mmol/L   Potassium 4.4 3.5 - 5.1 mmol/L   Chloride 111 96 - 112 mmol/L   CO2 20 19 - 32 mmol/L  Glucose, Bld 162 (H) 70 - 99 mg/dL   BUN 20 6 - 23 mg/dL   Creatinine, Ser 1.40 (H) 0.50 - 1.35 mg/dL   Calcium 8.0 (L) 8.4 - 10.5 mg/dL   GFR calc non Af Amer 45 (L) >90 mL/min   GFR calc Af Amer 52 (L) >90 mL/min    Comment: (NOTE) The eGFR has been calculated using the CKD EPI equation. This calculation has not been validated in all clinical situations. eGFR's persistently <90 mL/min signify possible Chronic Kidney Disease.    Anion gap 7 5 - 15  Hepatic function panel     Status: Abnormal   Collection Time: 09/10/14  4:33 AM  Result Value Ref Range   Total Protein 5.9 (L) 6.0 - 8.3 g/dL   Albumin 1.9 (L) 3.5 - 5.2 g/dL   AST 35 0 - 37 U/L   ALT 41 0 - 53 U/L   Alkaline Phosphatase 147 (H) 39 - 117 U/L   Total Bilirubin 1.3 (H) 0.3 - 1.2 mg/dL   Bilirubin, Direct 0.6 (H) 0.0 - 0.5 mg/dL   Indirect Bilirubin 0.7 0.3 - 0.9 mg/dL  Glucose, capillary     Status: Abnormal   Collection Time: 09/10/14  7:22 AM  Result Value Ref Range   Glucose-Capillary 124 (H) 70 - 99 mg/dL  Glucose, capillary      Status: Abnormal   Collection Time: 09/10/14  4:55 PM  Result Value Ref Range   Glucose-Capillary 151 (H) 70 - 99 mg/dL  Glucose, capillary     Status: Abnormal   Collection Time: 09/10/14  8:59 PM  Result Value Ref Range   Glucose-Capillary 170 (H) 70 - 99 mg/dL  Hepatic function panel     Status: Abnormal   Collection Time: 09/11/14  6:16 AM  Result Value Ref Range   Total Protein 6.4 6.0 - 8.3 g/dL   Albumin 2.0 (L) 3.5 - 5.2 g/dL   AST 24 0 - 37 U/L   ALT 33 0 - 53 U/L   Alkaline Phosphatase 141 (H) 39 - 117 U/L   Total Bilirubin 0.8 0.3 - 1.2 mg/dL   Bilirubin, Direct 0.3 0.0 - 0.5 mg/dL   Indirect Bilirubin 0.5 0.3 - 0.9 mg/dL  CBC     Status: Abnormal   Collection Time: 09/11/14  6:17 AM  Result Value Ref Range   WBC 8.8 4.0 - 10.5 K/uL   RBC 2.72 (L) 4.22 - 5.81 MIL/uL   Hemoglobin 8.2 (L) 13.0 - 17.0 g/dL   HCT 24.9 (L) 39.0 - 52.0 %   MCV 91.5 78.0 - 100.0 fL   MCH 30.1 26.0 - 34.0 pg   MCHC 32.9 30.0 - 36.0 g/dL   RDW 16.5 (H) 11.5 - 15.5 %   Platelets 331 150 - 400 K/uL  Basic metabolic panel     Status: Abnormal   Collection Time: 09/11/14  6:17 AM  Result Value Ref Range   Sodium 138 135 - 145 mmol/L   Potassium 4.2 3.5 - 5.1 mmol/L   Chloride 110 96 - 112 mmol/L   CO2 22 19 - 32 mmol/L   Glucose, Bld 181 (H) 70 - 99 mg/dL   BUN 20 6 - 23 mg/dL   Creatinine, Ser 1.35 0.50 - 1.35 mg/dL   Calcium 8.3 (L) 8.4 - 10.5 mg/dL   GFR calc non Af Amer 47 (L) >90 mL/min   GFR calc Af Amer 55 (L) >90 mL/min    Comment: (NOTE) The eGFR  has been calculated using the CKD EPI equation. This calculation has not been validated in all clinical situations. eGFR's persistently <90 mL/min signify possible Chronic Kidney Disease.    Anion gap 6 5 - 15    ABGS No results for input(s): PHART, PO2ART, TCO2, HCO3 in the last 72 hours.  Invalid input(s): PCO2 CULTURES Recent Results (from the past 240 hour(s))  Urine culture     Status: None   Collection Time: 09/03/14   9:30 AM  Result Value Ref Range Status   Specimen Description URINE, RANDOM  Final   Special Requests NONE  Final   Colony Count NO GROWTH Performed at [facility]   Final   Culture NO GROWTH Performed at [facility]   Final   Report Status 09/05/2014 FINAL  Final  Surgical pcr screen     Status: None   Collection Time: 09/03/14  5:55 PM  Result Value Ref Range Status   MRSA, PCR NEGATIVE NEGATIVE Final   Staphylococcus aureus NEGATIVE NEGATIVE Final    Comment:        The Xpert SA Assay (FDA approved for NASAL specimens in patients over 55 years of age), is one component of a comprehensive surveillance program.  Test performance has been validated by [facility] for patients greater than or equal to 79 year old. It is not intended to diagnose infection nor to guide or monitor treatment.   Culture, blood (routine x 2)     Status: None   Collection Time: 09/04/14  1:42 AM  Result Value Ref Range Status   Specimen Description RIGHT ANTECUBITAL  Final   Special Requests BOTTLES DRAWN AEROBIC AND ANAEROBIC 6CC  Final   Culture NO GROWTH 5 DAYS  Final   Report Status 09/09/2014 FINAL  Final  Culture, blood (routine x 2)     Status: None   Collection Time: 09/04/14  1:50 AM  Result Value Ref Range Status   Specimen Description BLOOD RIGHT ARM  Final   Special Requests BOTTLES DRAWN AEROBIC AND ANAEROBIC 6CC  Final   Culture NO GROWTH 5 DAYS  Final   Report Status 09/09/2014 FINAL  Final  Culture, routine-abscess     Status: None   Collection Time: 09/08/14 12:55 PM  Result Value Ref Range Status   Specimen Description ABSCESS INTRA ABDOMINAL  Final   Special Requests ZOSYN  Final   Gram Stain   Final    NO WBC SEEN NO SQUAMOUS EPITHELIAL CELLS SEEN NO ORGANISMS SEEN Performed at [facility]    Culture   Final    NO GROWTH 3 DAYS Performed at [facility]    Report Status 09/11/2014 FINAL  Final  Anaerobic culture     Status: None  (Preliminary result)   Collection Time: 09/08/14 12:58 PM  Result Value Ref Range Status   Specimen Description ABSCESS INTRA ABDOMINAL  Final   Special Requests ZOSYN  Final   Gram Stain   Final    NO WBC SEEN NO SQUAMOUS EPITHELIAL CELLS SEEN NO ORGANISMS SEEN Performed at [facility]    Culture   Final    NO ANAEROBES ISOLATED; CULTURE IN PROGRESS FOR 5 DAYS Performed at [facility]    Report Status PENDING  Incomplete   Studies/Results: No results found.  Medications:  Prior to Admission:  Prescriptions prior to admission  Medication Sig Dispense Refill Last Dose  . aspirin 325 MG tablet Take 325 mg by mouth daily.  09/02/2014 at Unknown time  . metFORMIN (GLUCOPHAGE) 500 MG tablet Take 500 mg by mouth 2 (two) times daily with a meal.     09/02/2014 at Unknown time  . metoprolol tartrate (LOPRESSOR) 25 MG tablet Take 25 mg by mouth 2 (two) times daily.     09/02/2014 at 800A  . pravastatin (PRAVACHOL) 40 MG tablet Take 40 mg by mouth daily.     08/30/2014 at Unknown time   Scheduled: . diltiazem  30 mg Oral 4 times per day  . insulin aspart  0-5 Units Subcutaneous QHS  . insulin aspart  0-9 Units Subcutaneous TID WC  . magnesium hydroxide  30 mL Oral BID  . metoprolol tartrate  50 mg Oral Q6H  . piperacillin-tazobactam (ZOSYN)  IV  3.375 g Intravenous Q8H  . pravastatin  40 mg Oral Daily   Continuous:  OHC:[id number] **OR** acetaminophen, guaiFENesin-dextromethorphan, HYDROcodone-acetaminophen, magnesium hydroxide, metoprolol, morphine injection, ondansetron **OR** ondansetron (ZOFRAN) IV  Assesment: He had acute cholecystitis. He had surgery 3 days ago and is doing quite well. His bowels are moving. He's not having much abdominal pain.  His cholecystitis was complicated by atrial fibrillation with rapid ventricular response and his heart rate is much better controlled now.  He has diabetes and is on sliding scale and doing well. Active Problems:    Peripheral vascular disease   Hepatic abscess   Abdominal pain   DM (diabetes mellitus)   Cholecystitis, acute   Atrial fibrillation with rapid ventricular response    Plan: I'm going to change his medications for A. fib now that he has maintained a good heart rate for 24 hours    LOS: 9 days   Mattingly Fountaine L 09/11/2014, 7:59 AM

## 2014-09-12 DIAGNOSIS — I5031 Acute diastolic (congestive) heart failure: Secondary | ICD-10-CM

## 2014-09-12 LAB — GLUCOSE, CAPILLARY
GLUCOSE-CAPILLARY: 135 mg/dL — AB (ref 70–99)
GLUCOSE-CAPILLARY: 126 mg/dL — AB (ref 70–99)
Glucose-Capillary: 144 mg/dL — ABNORMAL HIGH (ref 70–99)
Glucose-Capillary: 123 mg/dL — ABNORMAL HIGH (ref 70–99)

## 2014-09-12 MED ORDER — FUROSEMIDE 10 MG/ML IJ SOLN
20.0000 mg | Freq: Once | INTRAMUSCULAR | Status: AC
  Administered 2014-09-12: 20 mg via INTRAVENOUS
  Filled 2014-09-12: qty 2

## 2014-09-12 NOTE — Progress Notes (Signed)
Subjective: He says he feels okay. He has no complaints. He denies any pain.  Objective: Vital signs in last 24 hours: Temp:  [98.1 F (36.7 C)-98.7 F (37.1 C)] 98.7 F (37.1 C) (04/18 0614) Pulse Rate:  [63-78] 72 (04/18 0614) Resp:  [18-20] 20 (04/18 0614) BP: (129-154)/(56-60) 154/60 mmHg (04/18 0614) SpO2:  [91 %-94 %] 94 % (04/18 0614) Weight:  [107 kg (235 lb 14.3 oz)] 107 kg (235 lb 14.3 oz) (04/18 7588) Weight change: -1.137 kg (-2 lb 8.1 oz) Last BM Date: 09/09/14  Intake/Output from previous day: 04/17 0701 - 04/18 0700 In: 660 [P.O.:560; IV Piggyback:100] Out: 511 [Urine:400; Drains:110; Stool:1]  PHYSICAL EXAM General appearance: alert, cooperative and no distress Resp: clear to auscultation bilaterally Cardio: His heart is pretty regular on exam. GI: soft, non-tender; bowel sounds normal; no masses,  no organomegaly Extremities: extremities normal, atraumatic, no cyanosis or edema  Lab Results:  Results for orders placed or performed during the hospital encounter of 09/02/14 (from the past 48 hour(s))  Glucose, capillary     Status: Abnormal   Collection Time: 09/10/14  4:55 PM  Result Value Ref Range   Glucose-Capillary 151 (H) 70 - 99 mg/dL  Glucose, capillary     Status: Abnormal   Collection Time: 09/10/14  8:59 PM  Result Value Ref Range   Glucose-Capillary 170 (H) 70 - 99 mg/dL  Hepatic function panel     Status: Abnormal   Collection Time: 09/11/14  6:16 AM  Result Value Ref Range   Total Protein 6.4 6.0 - 8.3 g/dL   Albumin 2.0 (L) 3.5 - 5.2 g/dL   AST 24 0 - 37 U/L   ALT 33 0 - 53 U/L   Alkaline Phosphatase 141 (H) 39 - 117 U/L   Total Bilirubin 0.8 0.3 - 1.2 mg/dL   Bilirubin, Direct 0.3 0.0 - 0.5 mg/dL   Indirect Bilirubin 0.5 0.3 - 0.9 mg/dL  CBC     Status: Abnormal   Collection Time: 09/11/14  6:17 AM  Result Value Ref Range   WBC 8.8 4.0 - 10.5 K/uL   RBC 2.72 (L) 4.22 - 5.81 MIL/uL   Hemoglobin 8.2 (L) 13.0 - 17.0 g/dL   HCT 24.9  (L) 39.0 - 52.0 %   MCV 91.5 78.0 - 100.0 fL   MCH 30.1 26.0 - 34.0 pg   MCHC 32.9 30.0 - 36.0 g/dL   RDW 16.5 (H) 11.5 - 15.5 %   Platelets 331 150 - 400 K/uL  Basic metabolic panel     Status: Abnormal   Collection Time: 09/11/14  6:17 AM  Result Value Ref Range   Sodium 138 135 - 145 mmol/L   Potassium 4.2 3.5 - 5.1 mmol/L   Chloride 110 96 - 112 mmol/L   CO2 22 19 - 32 mmol/L   Glucose, Bld 181 (H) 70 - 99 mg/dL   BUN 20 6 - 23 mg/dL   Creatinine, Ser 1.35 0.50 - 1.35 mg/dL   Calcium 8.3 (L) 8.4 - 10.5 mg/dL   GFR calc non Af Amer 47 (L) >90 mL/min   GFR calc Af Amer 55 (L) >90 mL/min    Comment: (NOTE) The eGFR has been calculated using the CKD EPI equation. This calculation has not been validated in all clinical situations. eGFR's persistently <90 mL/min signify possible Chronic Kidney Disease.    Anion gap 6 5 - 15  Glucose, capillary     Status: Abnormal   Collection Time: 09/11/14  8:08 AM  Result Value Ref Range   Glucose-Capillary 163 (H) 70 - 99 mg/dL  Glucose, capillary     Status: Abnormal   Collection Time: 09/11/14 11:57 AM  Result Value Ref Range   Glucose-Capillary 163 (H) 70 - 99 mg/dL  Glucose, capillary     Status: Abnormal   Collection Time: 09/11/14  4:41 PM  Result Value Ref Range   Glucose-Capillary 130 (H) 70 - 99 mg/dL  Glucose, capillary     Status: Abnormal   Collection Time: 09/11/14  8:12 PM  Result Value Ref Range   Glucose-Capillary 166 (H) 70 - 99 mg/dL    ABGS No results for input(s): PHART, PO2ART, TCO2, HCO3 in the last 72 hours.  Invalid input(s): PCO2 CULTURES Recent Results (from the past 240 hour(s))  Urine culture     Status: None   Collection Time: 09/03/14  9:30 AM  Result Value Ref Range Status   Specimen Description URINE, RANDOM  Final   Special Requests NONE  Final   Colony Count NO GROWTH Performed at [facility]   Final   Culture NO GROWTH Performed at [facility]   Final   Report Status  09/05/2014 FINAL  Final  Surgical pcr screen     Status: None   Collection Time: 09/03/14  5:55 PM  Result Value Ref Range Status   MRSA, PCR NEGATIVE NEGATIVE Final   Staphylococcus aureus NEGATIVE NEGATIVE Final    Comment:        The Xpert SA Assay (FDA approved for NASAL specimens in patients over 38 years of age), is one component of a comprehensive surveillance program.  Test performance has been validated by [facility] for patients greater than or equal to 85 year old. It is not intended to diagnose infection nor to guide or monitor treatment.   Culture, blood (routine x 2)     Status: None   Collection Time: 09/04/14  1:42 AM  Result Value Ref Range Status   Specimen Description RIGHT ANTECUBITAL  Final   Special Requests BOTTLES DRAWN AEROBIC AND ANAEROBIC 6CC  Final   Culture NO GROWTH 5 DAYS  Final   Report Status 09/09/2014 FINAL  Final  Culture, blood (routine x 2)     Status: None   Collection Time: 09/04/14  1:50 AM  Result Value Ref Range Status   Specimen Description BLOOD RIGHT ARM  Final   Special Requests BOTTLES DRAWN AEROBIC AND ANAEROBIC 6CC  Final   Culture NO GROWTH 5 DAYS  Final   Report Status 09/09/2014 FINAL  Final  Culture, routine-abscess     Status: None   Collection Time: 09/08/14 12:55 PM  Result Value Ref Range Status   Specimen Description ABSCESS INTRA ABDOMINAL  Final   Special Requests ZOSYN  Final   Gram Stain   Final    NO WBC SEEN NO SQUAMOUS EPITHELIAL CELLS SEEN NO ORGANISMS SEEN Performed at [facility]    Culture   Final    NO GROWTH 3 DAYS Performed at [facility]    Report Status 09/11/2014 FINAL  Final  Anaerobic culture     Status: None (Preliminary result)   Collection Time: 09/08/14 12:58 PM  Result Value Ref Range Status   Specimen Description ABSCESS INTRA ABDOMINAL  Final   Special Requests ZOSYN  Final   Gram Stain   Final    NO WBC SEEN NO SQUAMOUS EPITHELIAL CELLS SEEN NO ORGANISMS  SEEN Performed at  [facility]   Final    NO ANAEROBES ISOLATED; CULTURE IN PROGRESS FOR 5 DAYS Performed at [facility]    Report Status PENDING  Incomplete   Studies/Results: No results found.  Medications:  Prior to Admission:  Prescriptions prior to admission  Medication Sig Dispense Refill Last Dose  . aspirin 325 MG tablet Take 325 mg by mouth daily.     09/02/2014 at Unknown time  . metFORMIN (GLUCOPHAGE) 500 MG tablet Take 500 mg by mouth 2 (two) times daily with a meal.     09/02/2014 at Unknown time  . metoprolol tartrate (LOPRESSOR) 25 MG tablet Take 25 mg by mouth 2 (two) times daily.     09/02/2014 at 800A  . pravastatin (PRAVACHOL) 40 MG tablet Take 40 mg by mouth daily.     08/30/2014 at Unknown time   Scheduled: . diltiazem  120 mg Oral Daily  . insulin aspart  0-5 Units Subcutaneous QHS  . insulin aspart  0-9 Units Subcutaneous TID WC  . magnesium hydroxide  30 mL Oral BID  . metoprolol tartrate  100 mg Oral BID  . piperacillin-tazobactam (ZOSYN)  IV  3.375 g Intravenous Q8H  . pravastatin  40 mg Oral Daily   Continuous:  JFH:[id number] **OR** acetaminophen, guaiFENesin-dextromethorphan, HYDROcodone-acetaminophen, magnesium hydroxide, metoprolol, morphine injection, ondansetron **OR** ondansetron (ZOFRAN) IV  Assesment: He was admitted with abdominal pain. He had acute cholecystitis. He has had open cholecystectomy and has done well. He is known to have peripheral vascular disease at baseline but has done well with that. He had atrial fibrillation with rapid ventricular response and his heart rate is much better controlled. He had elevated troponin level with that but this was felt to be more demand ischemia. Active Problems:   Peripheral vascular disease   Hepatic abscess   Abdominal pain   DM (diabetes mellitus)   Cholecystitis, acute   Atrial fibrillation with rapid ventricular response    Plan: Continue current treatments.  Discharge when okay surgically    LOS: 10 days   Satori Krabill L 09/12/2014, 8:25 AM

## 2014-09-12 NOTE — Discharge Instructions (Signed)
Open Cholecystectomy, Care After °Refer to this sheet in the next few weeks. These instructions provide you with information on caring for yourself after your procedure. Your health care provider may also give you more specific instructions. Your treatment has been planned according to current medical practices, but problems sometimes occur. Call your health care provider if you have any problems or questions after your procedure. °WHAT TO EXPECT AFTER THE PROCEDURE °After your procedure, it is typical to have the following: °· Pain at your incision site. You will be given medicines to control this pain. °· Constipation. You may be given stool softeners to help prevent this. °HOME CARE INSTRUCTIONS  °· Change bandages (dressings) as directed by your health care provider. °· Keep the wound dry and clean. You may wash the wound gently with soap and water. Gently blot or dab the wound dry. °· It is okay to take showers 48 hours after surgery. Do not take baths or use swimming pools or hot tubs for 2 weeks or until your health care provider approves. °· Only take over-the-counter or prescription medicines as directed by your health care provider. °· Continue your normal diet as directed by your health care provider. Eat plenty of fruits and vegetables to help prevent constipation. °· Drink enough fluids to keep your urine clear or pale yellow. This also helps prevent constipation. °· Do not lift anything heavier than 10 pounds (4.5 kg) for 1 month or until your health care provider approves. °· Do not play contact sports for 1 month or until your health care provider approves. °· Do not return to work or school for 4 weeks or until your health care provider approves. °SEEK MEDICAL CARE IF:  °· You have redness, swelling, or increasing pain in the wound. °· You see a yellowish-white fluid (pus) coming from the wound. °· You have drainage from the wound that lasts longer than 1 day. °· You notice a bad smell coming from  the wound or dressing. °· Your wound breaks open after the stitches (sutures) or staples have been removed. °· You have increasing pain in the shoulders (shoulder strap areas). °· You have dizzy episodes or faint while standing. °· You feel sick to your stomach (nauseous) or throw up (vomit). °SEEK IMMEDIATE MEDICAL CARE IF:  °· You have a fever. °· You develop a rash. °· You have difficulty breathing. °· You have chest pain. °· You have severe abdominal pain. °· You have leg pain. °Document Released: 08/29/2003 Document Revised: 03/03/2013 Document Reviewed: 12/23/2012 °ExitCare® Patient Information ©2015 [employer]. This information is not intended to replace advice given to you by your health care provider. Make sure you discuss any questions you have with your health care provider. °Bulb Drain Home Care °A bulb drain consists of a thin rubber tube and a soft, round bulb that creates a gentle suction. The rubber tube is placed in the area where you had surgery. A bulb is attached to the end of the tube that is outside the body. The bulb drain removes excess fluid that normally builds up in a surgical wound after surgery. The color and amount of fluid will vary. Immediately after surgery, the fluid is bright red and is a little thicker than water. It may gradually change to a yellow or pink color and become more thin and water-like. When the amount decreases to about 1 or 2 tbsp in 24 hours, your health care provider will usually remove it. °DAILY CARE °· Keep the bulb   flat (compressed) at all times, except while emptying it. The flatness creates suction. You can flatten the bulb by squeezing it firmly in the middle and then closing the cap. °· Keep sites where the tube enters the skin dry and covered with a bandage (dressing). °· Secure the tube 1-2 in (2.5-5.1 cm) below the insertion sites to keep it from pulling on your stitches. The tube is stitched in place and will not slip out. °· Secure the bulb as  directed by your health care provider. °· For the first 3 days after surgery, there usually is more fluid in the bulb. Empty the bulb whenever it becomes half full because the bulb does not create enough suction if it is too full. The bulb could also overflow. Write down how much fluid you remove each time you empty your drain. Add up the amount removed in 24 hours. °· Empty the bulb at the same time every day once the amount of fluid decreases and you only need to empty it once a day. Write down the amounts and the 24-hour totals to give to your health care provider. This helps your health care provider know when the tubes can be removed. °EMPTYING THE BULB DRAIN °Before emptying the bulb, get a measuring cup, a piece of paper and a pen, and wash your hands. °· Gently run your fingers down the tube (stripping) to empty any drainage from the tubing into the bulb. This may need to be done several times a day to clear the tubing of clots and tissue. °· Open the bulb cap to release suction, which causes it to inflate. Do not touch the inside of the cap. °· Gently run your fingers down the tube (stripping) to empty any drainage from the tubing into the bulb. °· Hold the cap out of the way, and pour fluid into the measuring cup.   °· Squeeze the bulb to provide suction.  °· Replace the cap.   °· Check the tape that holds the tube to your skin. If it is becoming loose, you can remove the loose piece of tape and apply a new one. Then, pin the bulb to your shirt.   °· Write down the amount of fluid you emptied out. Write down the date and each time you emptied your bulb drain. (If there are 2 bulbs, note the amount of drainage from each bulb and keep the totals separate. Your health care provider will want to know the total amounts for each drain and which tube is draining more.)   °· Flush the fluid down the toilet and wash your hands.   °· Call your health care provider once you have less than 2 tbsp of fluid collecting  in the bulb drain every 24 hours. °If there is drainage around the tube site, change dressings and keep the area dry. Cleanse around tube with sterile saline and place dry gauze around site. This gauze should be changed when it is soiled. If it stays clean and unsoiled, it should still be changed daily.  °SEEK MEDICAL CARE IF: °· Your drainage has a bad smell or is cloudy.   °· You have a fever.   °· Your drainage is increasing instead of decreasing.   °· Your tube fell out.   °· You have redness or swelling around the tube site.   °· You have drainage from a surgical wound.   °· Your bulb drain will not stay flat after you empty it.   °MAKE SURE YOU:  °· Understand these instructions. °· Will watch your condition. °·   Will get help right away if you are not doing well or get worse. °Document Released: 05/10/2000 Document Revised: 09/27/2013 Document Reviewed: 10/16/2011 °ExitCare® Patient Information ©2015 [employer]. This information is not intended to replace advice given to you by your health care provider. Make sure you discuss any questions you have with your health care provider. ° °

## 2014-09-12 NOTE — Evaluation (Signed)
Physical Therapy Evaluation Patient Details Name: Warren Mcdonald MRN: [id number] DOB: May 15, 1930 Today's Date: 09/12/2014   History of Present Illness  Pt is an 81 year old male who was admitted on 09-02-14 with abdominal pain.  He was ultimately found to have a gangrenous gallbladder which was removed on 09-08-14.  He has been minimally active since that time.  He lives with his wife and normally ambulates with a walker at home.  Clinical Impression   Pt is seen for evaluation and found to be significantly deconditioned from baseline due to extended hospital stay/surgery.  He was up in a recliner at the time of my arrival and states that he feels weak even trying to speak.  He does have antigravity strength in all 4 extremities but transfers are very labored.  He was only able to ambulate 42' with a walker before fatiguing.  He would like to return home at d/c but I am recommending SNF as he presents a high fall risk.    Follow Up Recommendations SNF    Equipment Recommendations  None recommended by PT    Recommendations for Other Services   OT    Precautions / Restrictions Precautions Precautions: Fall Precaution Comments: right upper quadrand abdominal incision with J tube drain      Mobility  Bed Mobility Overal bed mobility: Needs Assistance Bed Mobility: Sit to Supine       Sit to supine: Min assist   General bed mobility comments: pt instructed in log rolling in order to protect incision  Transfers Overall transfer level: Needs assistance Equipment used: Rolling walker (2 wheeled) Transfers: Sit to/from Stand Sit to Stand: Min assist         General transfer comment: pt was up in a low sitting recliner and was able to stand but this was extremely labored  Ambulation/Gait Ambulation/Gait assistance: Min guard Ambulation Distance (Feet): 15 Feet Assistive device: Rolling walker (2 wheeled) Gait Pattern/deviations: Shuffle;Trunk flexed   Gait velocity  interpretation: Below normal speed for age/gender                         Balance Overall balance assessment: No apparent balance deficits (not formally assessed) (balance will be compromised by weakness)                                           Pertinent Vitals/Pain Pain Assessment: No/denies pain    Home Living Family/patient expects to be discharged to:: Skilled nursing facility Living Arrangements: Spouse/significant other                    Prior Function Level of Independence: Independent with assistive device(s)                       Extremity/Trunk Assessment   Upper Extremity Assessment: Generalized weakness           Lower Extremity Assessment: Generalized weakness         Communication   Communication: No difficulties  Cognition Arousal/Alertness: Awake/alert Behavior During Therapy: WFL for tasks assessed/performed Overall Cognitive Status: Within Functional Limits for tasks assessed                            Exercises General Exercises - Lower Extremity Ankle Circles/Pumps: AROM;Both;10 reps;Seated [employer]  Sets: AROM;10 reps;Seated Gluteal Sets: AROM;Both;10 reps;Seated Long Arc Quad: AROM;Both;10 reps;Seated      Assessment/Plan    PT Assessment Patient needs continued PT services  PT Diagnosis Difficulty walking;Generalized weakness   PT Problem List Decreased strength;Decreased activity tolerance;Decreased mobility;Decreased balance;Pain  PT Treatment Interventions Gait training;Functional mobility training;Therapeutic exercise   PT Goals (Current goals can be found in the Care Plan section) Acute Rehab PT Goals Patient Stated Goal: return home PT Goal Formulation: With patient/family Time For Goal Achievement: 09/26/14 Potential to Achieve Goals: Good    Frequency Min 3X/week   Barriers to discharge   none                   End of Session Equipment Utilized During  Treatment: Gait belt Activity Tolerance: Patient limited by fatigue Patient left: in bed;with bed alarm set;with family/visitor present;with call bell/phone within reach           Time: 1339-1426 PT Time Calculation (min) (ACUTE ONLY): 47 min   Charges:   PT Evaluation $Initial PT Evaluation Tier I: 1 Procedure PT Treatments $Therapeutic Exercise: 8-22 mins   PT G CodesKonrad Penta 09/12/2014, 2:34 PM

## 2014-09-12 NOTE — Clinical Social Work Psychosocial (Signed)
Clinical Social Work Department BRIEF PSYCHOSOCIAL ASSESSMENT 09/12/2014  Patient:  Warren Mcdonald, Warren Mcdonald     Account Number:  [id number]     Admit date:  09/02/2014  Clinical Social Worker:  Warren Mcdonald  Date/Time:  09/12/2014 03:58 PM  Referred by:  CSW  Date Referred:  09/12/2014 Referred for  SNF Placement   Other Referral:   Interview type:  Patient Other interview type:   wife and son    PSYCHOSOCIAL DATA Living Status:  FAMILY Admitted from facility:   Level of care:   Primary support name:  Warren Mcdonald Primary support relationship to patient:  SPOUSE Degree of support available:   supportive    CURRENT CONCERNS Current Concerns  Post-Acute Placement   Other Concerns:    SOCIAL WORK ASSESSMENT / PLAN CSW met with pt and pt's wife and son at bedside. Pt alert and oriented and reports he is doing much better. He lives with his wife and has 2 sons and 4 daughters who live locally. Pt describes his family as 100% supportive and they help whenever needed. At baseline, pt is independent with ADLs. He uses a rollator if needed for longer distances. Pt enjoys being outside, particularly planting flowers and he is eager to leave to do this. He admits to feeling weak after surgery last week and is frustrated about his minimal activity as he is used to being more active. PT evaluated pt and recommendation is for SNF. CSW discussed SNF placement process and provided SNF list. Pt considered it briefly, but states, "I'm ready to roll out of here to go home." His wife was present for PT evaluation and feels that they can manage at home if this is his choice. Son also mentioned that he feels although pt will have less PT at home, he will do much better as his spirits will be high. Pt and family are very open to home health services. CSW notified CM.   Assessment/plan status:  Referral to [employer] Other assessment/ plan:   Information/referral to community resources:   SNF list  CM  for home health/equipment    PATIENT'S/FAMILY'S RESPONSE TO PLAN OF CARE: Pt is refusing SNF at this time as he feels they can manage at home and he has no interest in leaving hospital to go to another facility. CSW will sign off, but can be reconsulted if needed.       Warren Mcdonald, [place]

## 2014-09-12 NOTE — Progress Notes (Signed)
4 Days Post-Op  Subjective: Patient has no complaints.  Objective: Vital signs in last 24 hours: Temp:  [98.1 F (36.7 C)-98.7 F (37.1 C)] 98.7 F (37.1 C) (04/18 0614) Pulse Rate:  [63-78] 72 (04/18 0614) Resp:  [18-20] 20 (04/18 0614) BP: (129-154)/(56-60) 154/60 mmHg (04/18 0614) SpO2:  [91 %-94 %] 94 % (04/18 0614) Weight:  [107 kg (235 lb 14.3 oz)] 107 kg (235 lb 14.3 oz) (04/18 0614) Last BM Date: 09/09/14  Intake/Output from previous day: 04/17 0701 - 04/18 0700 In: 660 [P.O.:560; IV Piggyback:100] Out: 511 [Urine:400; Drains:110; Stool:1] Intake/Output this shift:    General appearance: alert, cooperative and no distress GI: Soft, incisions healing well. JP drainage serosanguineous in nature, minimal bile, 100 mL over the past 24 hours.  Lab Results:   Recent Labs  09/10/14 0433 09/11/14 0617  WBC 8.5 8.8  HGB 8.1* 8.2*  HCT 24.9* 24.9*  PLT 290 331   BMET  Recent Labs  09/10/14 0433 09/11/14 0617  NA 138 138  K 4.4 4.2  CL 111 110  CO2 20 22  GLUCOSE 162* 181*  BUN 20 20  CREATININE 1.40* 1.35  CALCIUM 8.0* 8.3*   PT/INR No results for input(s): LABPROT, INR in the last 72 hours.  Studies/Results: No results found.  Anti-infectives: Anti-infectives    Start     Dose/Rate Route Frequency Ordered Stop   09/03/14 1000  piperacillin-tazobactam (ZOSYN) IVPB 3.375 g     3.375 g 12.5 mL/hr over 240 Minutes Intravenous Every 8 hours 09/03/14 0831     09/03/14 0600  piperacillin-tazobactam (ZOSYN) IVPB 3.375 g     3.375 g 12.5 mL/hr over 240 Minutes Intravenous  Once 09/02/14 2329 09/03/14 0944   09/02/14 2200  piperacillin-tazobactam (ZOSYN) IVPB 3.375 g     3.375 g 12.5 mL/hr over 240 Minutes Intravenous  Once 09/02/14 2149 09/02/14 2217      Assessment/Plan: s/p Procedure(s): CHOLECYSTECTOMY Impression: Stable postoperatively. Patient is okay for discharge from surgery standpoint. I will send him home with a JP drain and see him in my  office in one week for follow-up.  LOS: 10 days    Allexa Acoff A 09/12/2014

## 2014-09-12 NOTE — Progress Notes (Signed)
Patient ID: Warren Mcdonald, male   DOB: 1930/05/22, 81 y.o.   MRN: [id number]     Subjective:    No complaints  Objective:   Temp:  [98.1 F (36.7 C)-98.7 F (37.1 C)] 98.7 F (37.1 C) (04/18 0614) Pulse Rate:  [63-78] 72 (04/18 0614) Resp:  [18-20] 20 (04/18 0614) BP: (129-154)/(56-60) 154/60 mmHg (04/18 0614) SpO2:  [91 %-94 %] 94 % (04/18 0614) Weight:  [235 lb 14.3 oz (107 kg)] 235 lb 14.3 oz (107 kg) (04/18 0614) Last BM Date: 09/09/14  Filed Weights   09/10/14 0500 09/11/14 0536 09/12/14 4098  Weight: 245 lb 11.2 oz (111.449 kg) 238 lb 6.4 oz (108.138 kg) 235 lb 14.3 oz (107 kg)    Intake/Output Summary (Last 24 hours) at 09/12/14 0836 Last data filed at 09/12/14 0634  Gross per 24 hour  Intake    660 ml  Output    511 ml  Net    149 ml    Telemetry: NSR  Exam:  General: NAD  Resp: faint crackles bilateral bases  Cardiac: RRR, 2/6 systolic murmur LLSB, JVD to angle of jaw  GI: abdomen soft, NT, ND  MSK: 1-2+ bilateral LE edema  Neuro: no focal deficits    Lab Results:  Basic Metabolic Panel:  Recent Labs Lab 09/05/14 1130  09/09/14 0531 09/10/14 0433 09/11/14 0617  NA 135  < > 141 138 138  K 4.4  < > 4.1 4.4 4.2  CL 105  < > 114* 111 110  CO2 23  < > GLUCOSE 166*  < > 146* 162* 181*  BUN 23  < > CREATININE 1.59*  < > 1.48* 1.40* 1.35  CALCIUM 8.2*  < > 7.8* 8.0* 8.3*  MG 2.1  --  1.5  --   --   < > = values in this interval not displayed.  Liver Function Tests:  Recent Labs Lab 09/09/14 0531 09/10/14 0433 09/11/14 0616  AST 83* 35 24  ALT 55* 41 33  ALKPHOS 189* 147* 141*  BILITOT 3.1* 1.3* 0.8  PROT 5.6* 5.9* 6.4  ALBUMIN 1.8* 1.9* 2.0*    CBC:  Recent Labs Lab 09/09/14 0531 09/10/14 0433 09/11/14 0617  WBC 7.1 8.5 8.8  HGB 8.2* 8.1* 8.2*  HCT 25.2* 24.9* 24.9*  MCV 91.3 91.9 91.5  PLT 338 290 331    Cardiac Enzymes:  Recent Labs Lab 09/05/14 1130 09/05/14 1721 09/05/14 2235    TROPONINI 0.59* 0.51* 0.57*    BNP: No results for input(s): PROBNP in the last 8760 hours.  Coagulation: No results for input(s): INR in the last 168 hours.  ECG:   Medications:   Scheduled Medications: . diltiazem  120 mg Oral Daily  . insulin aspart  0-5 Units Subcutaneous QHS  . insulin aspart  0-9 Units Subcutaneous TID WC  . magnesium hydroxide  30 mL Oral BID  . metoprolol tartrate  100 mg Oral BID  . piperacillin-tazobactam (ZOSYN)  IV  3.375 g Intravenous Q8H  . pravastatin  40 mg Oral Daily     Infusions:     PRN Medications:  acetaminophen **OR** acetaminophen, guaiFENesin-dextromethorphan, HYDROcodone-acetaminophen, magnesium hydroxide, metoprolol, morphine injection, ondansetron **OR** ondansetron (ZOFRAN) IV     Assessment/Plan    1. Afib - rate controlled with dilt CD  daily and lopressor  bid.Tele shows he has converted back to NSR - CHADS2Vasc score is 5, anticoag on hold postop. Would start eliquis  5mg  bid when ok from surgical standpoint. From there last note continued to hold while JP drain in place.   2. Cholecystitis - s/p cholecystectomy, followed by surgery  3. Elevated troponin - suspect demand ischemia in setting of afib with RVR. Echo with normal LVEF and no WMAs.   4. Acute diastolic heart failure - evidence of LE edema and elevated JVD. Likely due to afib with elevated rates - will give lasix IV 20mg  IV x 1 today.     Dina Rich, M.D.

## 2014-09-13 LAB — ANAEROBIC CULTURE: GRAM STAIN: NONE SEEN

## 2014-09-13 LAB — GLUCOSE, CAPILLARY
GLUCOSE-CAPILLARY: 122 mg/dL — AB (ref 70–99)
Glucose-Capillary: 143 mg/dL — ABNORMAL HIGH (ref 70–99)

## 2014-09-13 MED ORDER — AMOXICILLIN-POT CLAVULANATE 875-125 MG PO TABS: 1.0000 | ORAL_TABLET | Freq: Two times a day (BID) | ORAL | Status: DC

## 2014-09-13 MED ORDER — FUROSEMIDE 10 MG/ML IJ SOLN
40.0000 mg | Freq: Once | INTRAMUSCULAR | Status: AC
  Administered 2014-09-13: 40 mg via INTRAVENOUS
  Filled 2014-09-13: qty 4

## 2014-09-13 MED ORDER — METOPROLOL TARTRATE 100 MG PO TABS: 100.0000 mg | ORAL_TABLET | Freq: Two times a day (BID) | ORAL | Status: DC

## 2014-09-13 MED ORDER — POTASSIUM CHLORIDE ER 10 MEQ PO TBCR: 10.0000 meq | EXTENDED_RELEASE_TABLET | Freq: Every day | ORAL | Status: DC

## 2014-09-13 MED ORDER — FUROSEMIDE 20 MG PO TABS: 20.0000 mg | ORAL_TABLET | Freq: Every day | ORAL | Status: DC

## 2014-09-13 MED ORDER — FUROSEMIDE 10 MG/ML IJ SOLN: 40.0000 mg | Freq: Once | INTRAMUSCULAR | Status: DC

## 2014-09-13 MED ORDER — DILTIAZEM HCL ER COATED BEADS 180 MG PO CP24
180.0000 mg | ORAL_CAPSULE | Freq: Every day | ORAL | Status: DC
  Administered 2014-09-13: 180 mg via ORAL
  Filled 2014-09-13: qty 1

## 2014-09-13 MED ORDER — DILTIAZEM HCL ER COATED BEADS 180 MG PO CP24: 180.0000 mg | ORAL_CAPSULE | Freq: Every day | ORAL | Status: DC

## 2014-09-13 MED ORDER — APIXABAN 5 MG PO TABS: 5.0000 mg | ORAL_TABLET | Freq: Two times a day (BID) | ORAL | Status: DC

## 2014-09-13 MED ORDER — HYDROCODONE-ACETAMINOPHEN 5-325 MG PO TABS: 1.0000 | ORAL_TABLET | Freq: Four times a day (QID) | ORAL | Status: DC | PRN

## 2014-09-13 NOTE — Progress Notes (Signed)
Pt discharged home today per Dr. Juanetta Gosling. Pt's IV site D/C'd and WDL. Pt's VSS. Pt provided with home medication list, discharge instructions and prescriptions. Verbalized understanding. Pt and pt's wife educated on JP drain after discharge. Verbalized understanding. Pt left floor via WC in stable condition accompanied by NT.

## 2014-09-13 NOTE — Progress Notes (Addendum)
Patient: Warren Mcdonald / Admit Date: 09/02/2014 / Date of Encounter: 09/13/2014, 8:29 AM   Subjective: Feeling better every day. No SOB or CP. Still with LEE.   Objective: Telemetry: NSR occasional PVCs, brief atrial tach this AM Physical Exam: Blood pressure 150/69, pulse 72, temperature 98.8 F (37.1 C), temperature source Oral, resp. rate 20, height 6\' 2"  (1.88 m), weight 229 lb 12.8 oz (104.237 kg), SpO2 96 %. General: Well developed, well nourished AAM, in no acute distress. Head: Normocephalic, atraumatic, sclera non-icteric, no xanthomas, nares are without discharge. Neck: Mild JVD noted. Lungs: Clear bilaterally to auscultation without wheezes, rales, or rhonchi. Breathing is unlabored. Heart: RRR S1 S2, soft SEM, no rubs or gallops.  Abdomen: Soft, non-tender, non-distended with normoactive bowel sounds. No rebound/guarding. Extremities: No clubbing or cyanosis. 1+ BLE edema.  Neuro: Alert and oriented X 3. Moves all extremities spontaneously. Psych:  Responds to questions appropriately with a normal affect.   Intake/Output Summary (Last 24 hours) at 09/13/14 0829 Last data filed at 09/13/14 0500  Gross per 24 hour  Intake    340 ml  Output    535 ml  Net   -195 ml    Inpatient Medications:  . diltiazem  120 mg Oral Daily  . insulin aspart  0-5 Units Subcutaneous QHS  . insulin aspart  0-9 Units Subcutaneous TID WC  . magnesium hydroxide  30 mL Oral BID  . metoprolol tartrate  100 mg Oral BID  . piperacillin-tazobactam (ZOSYN)  IV  3.375 g Intravenous Q8H  . pravastatin  40 mg Oral Daily   Infusions:    Labs:  Recent Labs  09/11/14 0617  NA 138  K 4.2  CL 110  CO2 22  GLUCOSE 181*  BUN 20  CREATININE 1.35  CALCIUM 8.3*    Recent Labs  09/11/14 0616  AST 24  ALT 33  ALKPHOS 141*  BILITOT 0.8  PROT 6.4  ALBUMIN 2.0*    Recent Labs  09/11/14 0617  WBC 8.8  HGB 8.2*  HCT 24.9*  MCV 91.5  PLT 331   Radiology/Studies:  Dg Chest 2  View  09/03/2014   CLINICAL DATA:  Abdominal pain, liver abscess  EXAM: CHEST  2 VIEW  COMPARISON:  09/02/2014 images of the lung bases  FINDINGS: Cardiomediastinal silhouette is stable. No acute infiltrate or pleural effusion. No pulmonary edema. Mild degenerative changes thoracic spine.  IMPRESSION: No active cardiopulmonary disease.   Electronically Signed   By: Natasha Mead M.D.   On: 09/03/2014 12:56   Ct Abdomen Pelvis W Contrast  09/02/2014   CLINICAL DATA:  Sharp pain just above the umbilicus for 2 weeks. Constipation with no bowel movement for 1 week. Periumbilical abdominal pain.  EXAM: CT ABDOMEN AND PELVIS WITH CONTRAST  TECHNIQUE: Multidetector CT imaging of the abdomen and pelvis was performed using the standard protocol following bolus administration of intravenous contrast.  CONTRAST:  67mL OMNIPAQUE IOHEXOL 300 MG/ML SOLN, OMNIPAQUE IOHEXOL 300 MG/ML SOLN  COMPARISON:  08/05/2008  FINDINGS: Mild dependent atelectasis or fibrosis in the lung bases. Mild bronchiectasis. Coronary artery calcifications.  Cholelithiasis with distended gallbladder and mild gallbladder wall thickening. This may indicate acute cholecystitis. Since the previous study, there is interval development of a heterogeneous hypo attenuating collection in the medial segment left lobe of liver measuring about 3 x 3.3 cm diameter. This is adjacent to the gallbladder. Differential diagnosis includes hepatic abscess, infiltrating gallbladder neoplasm, or metastasis. Suggest MRI for further characterization. No  other focal liver lesions demonstrated. No bile duct dilatation. The pancreas, spleen, adrenal glands, inferior vena cava, and retroperitoneal lymph nodes are unremarkable. Calcific and noncalcific atherosclerotic changes in the abdominal aorta without aneurysm. Multiple cysts in the kidneys. No hydronephrosis. Stomach, small bowel, and colon are not abnormally distended. No free air or free fluid in the abdomen.  Pelvis:  Bladder wall is not thickened. Diffuse enlargement of the prostate gland at 5.4 x 7.2 cm. No free or loculated pelvic fluid collections. No pelvic lymphadenopathy. Appendix is surgically absent. No evidence of diverticulitis. Degenerative changes in the spine. No destructive bone lesions.  IMPRESSION: Gallbladder distention with cholelithiasis and gallbladder wall thickening may indicate acute cholecystitis. Heterogeneous hypo attenuating septated lesion demonstrated in the medial segment left lobe of the liver adjacent to the gallbladder. Appearance is most likely to represent hepatic abscess although differential diagnosis would include infiltrating gallbladder neoplasm or metastasis. Suggest MRI for further evaluation. Diffuse enlargement of prostate gland. No bowel obstruction.   Electronically Signed   By: Burman Nieves M.D.   On: 09/02/2014 21:31     Assessment and Plan  1. Paroxysmal atrial fib  - maintaining NSR by telemetry with very brief runs of breakthrough SVT (?atrial tach) - will increase diltiazem to  daily  - continue Lopressor at  BID - CHADS2Vasc score is 5, anticoag on hold postop. Would start Eliquis  bid when ok from surgical standpoint - will need to follow Hgb/Cr as outpatient.  2. Acute cholecystitis and hepatic abscess with hospital course notable for AKI (improved), abnormal LFTs, post-op anemia - s/p cholecystectomy, followed by surgery  3. Elevated troponin - suspect demand ischemia in setting of afib with RVR. Echo with normal LVEF and no WMAs.  - will discuss with MD whether outpatient risk stratification would be helpful given history of PAD - add lipids in AM  4. Acute diastolic heart failure - evidence of LE edema and elevated JVD. Likely due to afib with elevated rates - will discuss plans for diuresis with MD - home weight was around 210. Peak weight 240 this admission and down to 229 today but still appears volume overloaded.   5.  Hypertension - increase diltiazem as above and follow  Signed, Maryruth Hancock Pager: [phone]   Patient seen and discussed with PA Dunn, I agree with her note above. Agree with increasing diltizem to  daily for better bp control and to suppress his PSVT. Have not seen recurrent afib. Restart eliquis when ok from surgical standpoint. Still with some fluid overload, given IV lasix yesterday, I/Os do not appear incomplete. Labs are pending this AM. He received  IV lasix today , at discharge would recommend increase his home lasix to  daily. Acute diastolic HF likely due to afib with RVR, should improve now that rates are better controlled. If discharged today will need f/u with [place] in 1 week with NP Lyman Bishop.     Dominga Ferry MD

## 2014-09-13 NOTE — Progress Notes (Signed)
5 Days Post-Op  Subjective: Patient has no complaints.  Objective: Vital signs in last 24 hours: Temp:  [97.8 F (36.6 C)-98.8 F (37.1 C)] 98.8 F (37.1 C) (04/19 0554) Pulse Rate:  [69-72] 72 (04/19 0554) Resp:  [20] 20 (04/19 0554) BP: (144-150)/(66-69) 150/69 mmHg (04/19 0554) SpO2:  [95 %-96 %] 96 % (04/19 0554) Weight:  [104.237 kg (229 lb 12.8 oz)] 104.237 kg (229 lb 12.8 oz) (04/19 0554) Last BM Date: 09/12/14  Intake/Output from previous day: 04/18 0701 - 04/19 0700 In: 340 [P.O.:240; IV Piggyback:100] Out: 535 [Urine:450; Drains:85] Intake/Output this shift:    General appearance: alert, cooperative and no distress GI: Soft. Incision healing well. JP drainage slowly decreasing. Serosanguineous in nature.  Lab Results:   Recent Labs  09/11/14 0617  WBC 8.8  HGB 8.2*  HCT 24.9*  PLT 331   BMET  Recent Labs  09/11/14 0617  NA 138  K 4.2  CL 110  CO2 22  GLUCOSE 181*  BUN 20  CREATININE 1.35  CALCIUM 8.3*   PT/INR No results for input(s): LABPROT, INR in the last 72 hours.  Studies/Results: No results found.  Anti-infectives: Anti-infectives    Start     Dose/Rate Route Frequency Ordered Stop   09/03/14 1000  piperacillin-tazobactam (ZOSYN) IVPB 3.375 g     3.375 g 12.5 mL/hr over 240 Minutes Intravenous Every 8 hours 09/03/14 0831     09/03/14 0600  piperacillin-tazobactam (ZOSYN) IVPB 3.375 g     3.375 g 12.5 mL/hr over 240 Minutes Intravenous  Once 09/02/14 2329 09/03/14 0944   09/02/14 2200  piperacillin-tazobactam (ZOSYN) IVPB 3.375 g     3.375 g 12.5 mL/hr over 240 Minutes Intravenous  Once 09/02/14 2149 09/02/14 2217      Assessment/Plan: s/p Procedure(s): CHOLECYSTECTOMY Impression: Stable from surgical standpoint. Will see patient on 09/20/2014 in follow-up. JP instructions given.  LOS: 11 days    Brook Geraci A 09/13/2014

## 2014-09-13 NOTE — Progress Notes (Signed)
Subjective: He says he feels okay and wants to go home. No new complaints.  Objective: Vital signs in last 24 hours: Temp:  [97.8 F (36.6 C)-98.8 F (37.1 C)] 98.8 F (37.1 C) (04/19 0554) Pulse Rate:  [69-72] 72 (04/19 0554) Resp:  [20] 20 (04/19 0554) BP: (144-150)/(66-69) 150/69 mmHg (04/19 0554) SpO2:  [95 %-96 %] 96 % (04/19 0554) Weight:  [104.237 kg (229 lb 12.8 oz)] 104.237 kg (229 lb 12.8 oz) (04/19 0554) Weight change: -2.764 kg (-6 lb 1.5 oz) Last BM Date: 09/12/14  Intake/Output from previous day: 04/18 0701 - 04/19 0700 In: 340 [P.O.:240; IV Piggyback:100] Out: 535 [Urine:450; Drains:85]  PHYSICAL EXAM General appearance: alert, cooperative and no distress Resp: clear to auscultation bilaterally Cardio: irregularly irregular rhythm GI: He has some swelling of his abdomen Extremities: He has bilateral edema  Lab Results:  Results for orders placed or performed during the hospital encounter of 09/02/14 (from the past 48 hour(s))  Glucose, capillary     Status: Abnormal   Collection Time: 09/11/14 11:57 AM  Result Value Ref Range   Glucose-Capillary 163 (H) 70 - 99 mg/dL  Glucose, capillary     Status: Abnormal   Collection Time: 09/11/14  4:41 PM  Result Value Ref Range   Glucose-Capillary 130 (H) 70 - 99 mg/dL  Glucose, capillary     Status: Abnormal   Collection Time: 09/11/14  8:12 PM  Result Value Ref Range   Glucose-Capillary 166 (H) 70 - 99 mg/dL  Glucose, capillary     Status: Abnormal   Collection Time: 09/12/14  8:35 AM  Result Value Ref Range   Glucose-Capillary 135 (H) 70 - 99 mg/dL   Comment 1 Notify RN   Glucose, capillary     Status: Abnormal   Collection Time: 09/12/14 11:34 AM  Result Value Ref Range   Glucose-Capillary 144 (H) 70 - 99 mg/dL   Comment 1 Notify RN   Glucose, capillary     Status: Abnormal   Collection Time: 09/12/14  4:52 PM  Result Value Ref Range   Glucose-Capillary 126 (H) 70 - 99 mg/dL  Glucose, capillary      Status: Abnormal   Collection Time: 09/12/14  9:57 PM  Result Value Ref Range   Glucose-Capillary 123 (H) 70 - 99 mg/dL  Glucose, capillary     Status: Abnormal   Collection Time: 09/13/14  7:56 AM  Result Value Ref Range   Glucose-Capillary 143 (H) 70 - 99 mg/dL   Comment 1 Notify RN     ABGS No results for input(s): PHART, PO2ART, TCO2, HCO3 in the last 72 hours.  Invalid input(s): PCO2 CULTURES Recent Results (from the past 240 hour(s))  Urine culture     Status: None   Collection Time: 09/03/14  9:30 AM  Result Value Ref Range Status   Specimen Description URINE, RANDOM  Final   Special Requests NONE  Final   Colony Count NO GROWTH Performed at [facility]   Final   Culture NO GROWTH Performed at [facility]   Final   Report Status 09/05/2014 FINAL  Final  Surgical pcr screen     Status: None   Collection Time: 09/03/14  5:55 PM  Result Value Ref Range Status   MRSA, PCR NEGATIVE NEGATIVE Final   Staphylococcus aureus NEGATIVE NEGATIVE Final    Comment:        The Xpert SA Assay (FDA approved for NASAL specimens in patients over 56 years of age),  is one component of a comprehensive surveillance program.  Test performance has been validated by [facility] for patients greater than or equal to 75 year old. It is not intended to diagnose infection nor to guide or monitor treatment.   Culture, blood (routine x 2)     Status: None   Collection Time: 09/04/14  1:42 AM  Result Value Ref Range Status   Specimen Description RIGHT ANTECUBITAL  Final   Special Requests BOTTLES DRAWN AEROBIC AND ANAEROBIC 6CC  Final   Culture NO GROWTH 5 DAYS  Final   Report Status 09/09/2014 FINAL  Final  Culture, blood (routine x 2)     Status: None   Collection Time: 09/04/14  1:50 AM  Result Value Ref Range Status   Specimen Description BLOOD RIGHT ARM  Final   Special Requests BOTTLES DRAWN AEROBIC AND ANAEROBIC 6CC  Final   Culture NO GROWTH 5 DAYS  Final    Report Status 09/09/2014 FINAL  Final  Culture, routine-abscess     Status: None   Collection Time: 09/08/14 12:55 PM  Result Value Ref Range Status   Specimen Description ABSCESS INTRA ABDOMINAL  Final   Special Requests ZOSYN  Final   Gram Stain   Final    NO WBC SEEN NO SQUAMOUS EPITHELIAL CELLS SEEN NO ORGANISMS SEEN Performed at [facility]    Culture   Final    NO GROWTH 3 DAYS Performed at [facility]    Report Status 09/11/2014 FINAL  Final  Anaerobic culture     Status: None   Collection Time: 09/08/14 12:58 PM  Result Value Ref Range Status   Specimen Description ABSCESS INTRA ABDOMINAL  Final   Special Requests ZOSYN  Final   Gram Stain   Final    NO WBC SEEN NO SQUAMOUS EPITHELIAL CELLS SEEN NO ORGANISMS SEEN Performed at [facility]    Culture   Final    NO ANAEROBES ISOLATED Performed at [facility]    Report Status 09/13/2014 FINAL  Final   Studies/Results: No results found.  Medications:  Prior to Admission:  Prescriptions prior to admission  Medication Sig Dispense Refill Last Dose  . aspirin 325 MG tablet Take 325 mg by mouth daily.     09/02/2014 at Unknown time  . metFORMIN (GLUCOPHAGE) 500 MG tablet Take 500 mg by mouth 2 (two) times daily with a meal.     09/02/2014 at Unknown time  . metoprolol tartrate (LOPRESSOR) 25 MG tablet Take 25 mg by mouth 2 (two) times daily.     09/02/2014 at 800A  . pravastatin (PRAVACHOL) 40 MG tablet Take 40 mg by mouth daily.     08/30/2014 at Unknown time   Scheduled: . diltiazem  180 mg Oral Daily  . insulin aspart  0-5 Units Subcutaneous QHS  . insulin aspart  0-9 Units Subcutaneous TID WC  . magnesium hydroxide  30 mL Oral BID  . metoprolol tartrate  100 mg Oral BID  . piperacillin-tazobactam (ZOSYN)  IV  3.375 g Intravenous Q8H  . pravastatin  40 mg Oral Daily   Continuous:  RUE:[id number] **OR** acetaminophen, guaiFENesin-dextromethorphan, HYDROcodone-acetaminophen,  magnesium hydroxide, metoprolol, morphine injection, ondansetron **OR** ondansetron (ZOFRAN) IV  Assesment: He was admitted with acute cholecystitis. He developed atrial fibrillation with rapid ventricular response. He also developed acute diastolic heart failure. He does appear to be somewhat volume overloaded now. He has done well postop and wants to go home. Active Problems:  Peripheral vascular disease   Hepatic abscess   Abdominal pain   DM (diabetes mellitus)   Cholecystitis, acute   Atrial fibrillation with rapid ventricular response   Acute diastolic heart failure    Plan: Discharge home. He will be diuresed at home. Will discuss with his surgeon when he can start Eliquis    LOS: 11 days   [employer] [place] L 09/13/2014, 8:56 AM

## 2014-09-13 NOTE — Discharge Summary (Signed)
Physician Discharge Summary  Patient ID: Warren Mcdonald MRN: [id number] DOB/AGE: November 09, 1929 81 y.o. Primary Care Physician:Jackee Glasner L, MD Admit date: 09/02/2014 Discharge date: 09/13/2014    Discharge Diagnoses:   Active Problems:   Peripheral vascular disease   Hepatic abscess   Abdominal pain   DM (diabetes mellitus)   Cholecystitis, acute   Atrial fibrillation with rapid ventricular response   Acute diastolic heart failure     Medication List    STOP taking these medications        aspirin 325 MG tablet      TAKE these medications        amoxicillin-clavulanate 875-125 MG per tablet  Commonly known as:  AUGMENTIN  Take 1 tablet by mouth 2 (two) times daily.     apixaban 5 MG Tabs tablet  Commonly known as:  ELIQUIS  Take 1 tablet (5 mg total) by mouth 2 (two) times daily.     diltiazem 180 MG 24 hr capsule  Commonly known as:  CARDIZEM CD  Take 1 capsule (180 mg total) by mouth daily.     furosemide 20 MG tablet  Commonly known as:  LASIX  Take 1 tablet (20 mg total) by mouth daily.     HYDROcodone-acetaminophen 5-325 MG per tablet  Commonly known as:  NORCO/VICODIN  Take 1-2 tablets by mouth every 6 (six) hours as needed for moderate pain.     metFORMIN 500 MG tablet  Commonly known as:  GLUCOPHAGE  Take 500 mg by mouth 2 (two) times daily with a meal.     metoprolol 100 MG tablet  Commonly known as:  LOPRESSOR  Take 1 tablet (100 mg total) by mouth 2 (two) times daily.     potassium chloride 10 MEQ tablet  Commonly known as:  K-DUR  Take 1 tablet (10 mEq total) by mouth daily.     pravastatin 40 MG tablet  Commonly known as:  PRAVACHOL  Take 40 mg by mouth daily.        Discharged Condition: Improved    Consults: Gen. surgery/cardiology  Significant Diagnostic Studies: Dg Chest 2 View  09/03/2014   CLINICAL DATA:  Abdominal pain, liver abscess  EXAM: CHEST  2 VIEW  COMPARISON:  09/02/2014 images of the lung bases  FINDINGS:  Cardiomediastinal silhouette is stable. No acute infiltrate or pleural effusion. No pulmonary edema. Mild degenerative changes thoracic spine.  IMPRESSION: No active cardiopulmonary disease.   Electronically Signed   By: Natasha Mead M.D.   On: 09/03/2014 12:56   Ct Abdomen Pelvis W Contrast  09/02/2014   CLINICAL DATA:  Sharp pain just above the umbilicus for 2 weeks. Constipation with no bowel movement for 1 week. Periumbilical abdominal pain.  EXAM: CT ABDOMEN AND PELVIS WITH CONTRAST  TECHNIQUE: Multidetector CT imaging of the abdomen and pelvis was performed using the standard protocol following bolus administration of intravenous contrast.  CONTRAST:  50mL OMNIPAQUE IOHEXOL 300 MG/ML SOLN, OMNIPAQUE IOHEXOL 300 MG/ML SOLN  COMPARISON:  08/05/2008  FINDINGS: Mild dependent atelectasis or fibrosis in the lung bases. Mild bronchiectasis. Coronary artery calcifications.  Cholelithiasis with distended gallbladder and mild gallbladder wall thickening. This may indicate acute cholecystitis. Since the previous study, there is interval development of a heterogeneous hypo attenuating collection in the medial segment left lobe of liver measuring about 3 x 3.3 cm diameter. This is adjacent to the gallbladder. Differential diagnosis includes hepatic abscess, infiltrating gallbladder neoplasm, or metastasis. Suggest MRI for further characterization. No other focal  liver lesions demonstrated. No bile duct dilatation. The pancreas, spleen, adrenal glands, inferior vena cava, and retroperitoneal lymph nodes are unremarkable. Calcific and noncalcific atherosclerotic changes in the abdominal aorta without aneurysm. Multiple cysts in the kidneys. No hydronephrosis. Stomach, small bowel, and colon are not abnormally distended. No free air or free fluid in the abdomen.  Pelvis: Bladder wall is not thickened. Diffuse enlargement of the prostate gland at 5.4 x 7.2 cm. No free or loculated pelvic fluid collections. No pelvic  lymphadenopathy. Appendix is surgically absent. No evidence of diverticulitis. Degenerative changes in the spine. No destructive bone lesions.  IMPRESSION: Gallbladder distention with cholelithiasis and gallbladder wall thickening may indicate acute cholecystitis. Heterogeneous hypo attenuating septated lesion demonstrated in the medial segment left lobe of the liver adjacent to the gallbladder. Appearance is most likely to represent hepatic abscess although differential diagnosis would include infiltrating gallbladder neoplasm or metastasis. Suggest MRI for further evaluation. Diffuse enlargement of prostate gland. No bowel obstruction.   Electronically Signed   By: Burman Nieves M.D.   On: 09/02/2014 21:31    Lab Results: Basic Metabolic Panel:  Recent Labs  56/43/32 0617  NA 138  K 4.2  CL 110  CO2 22  GLUCOSE 181*  BUN 20  CREATININE 1.35  CALCIUM 8.3*   Liver Function Tests:  Recent Labs  09/11/14 0616  AST 24  ALT 33  ALKPHOS 141*  BILITOT 0.8  PROT 6.4  ALBUMIN 2.0*     CBC:  Recent Labs  09/11/14 0617  WBC 8.8  HGB 8.2*  HCT 24.9*  MCV 91.5  PLT 331    Recent Results (from the past 240 hour(s))  Urine culture     Status: None   Collection Time: 09/03/14  9:30 AM  Result Value Ref Range Status   Specimen Description URINE, RANDOM  Final   Special Requests NONE  Final   Colony Count NO GROWTH Performed at [facility]   Final   Culture NO GROWTH Performed at [facility]   Final   Report Status 09/05/2014 FINAL  Final  Surgical pcr screen     Status: None   Collection Time: 09/03/14  5:55 PM  Result Value Ref Range Status   MRSA, PCR NEGATIVE NEGATIVE Final   Staphylococcus aureus NEGATIVE NEGATIVE Final    Comment:        The Xpert SA Assay (FDA approved for NASAL specimens in patients over 21 years of age), is one component of a comprehensive surveillance program.  Test performance has been validated by [facility] for  patients greater than or equal to 99 year old. It is not intended to diagnose infection nor to guide or monitor treatment.   Culture, blood (routine x 2)     Status: None   Collection Time: 09/04/14  1:42 AM  Result Value Ref Range Status   Specimen Description RIGHT ANTECUBITAL  Final   Special Requests BOTTLES DRAWN AEROBIC AND ANAEROBIC 6CC  Final   Culture NO GROWTH 5 DAYS  Final   Report Status 09/09/2014 FINAL  Final  Culture, blood (routine x 2)     Status: None   Collection Time: 09/04/14  1:50 AM  Result Value Ref Range Status   Specimen Description BLOOD RIGHT ARM  Final   Special Requests BOTTLES DRAWN AEROBIC AND ANAEROBIC 6CC  Final   Culture NO GROWTH 5 DAYS  Final   Report Status 09/09/2014 FINAL  Final  Culture, routine-abscess  Status: None   Collection Time: 09/08/14 12:55 PM  Result Value Ref Range Status   Specimen Description ABSCESS INTRA ABDOMINAL  Final   Special Requests ZOSYN  Final   Gram Stain   Final    NO WBC SEEN NO SQUAMOUS EPITHELIAL CELLS SEEN NO ORGANISMS SEEN Performed at [facility]    Culture   Final    NO GROWTH 3 DAYS Performed at [facility]    Report Status 09/11/2014 FINAL  Final  Anaerobic culture     Status: None   Collection Time: 09/08/14 12:58 PM  Result Value Ref Range Status   Specimen Description ABSCESS INTRA ABDOMINAL  Final   Special Requests ZOSYN  Final   Gram Stain   Final    NO WBC SEEN NO SQUAMOUS EPITHELIAL CELLS SEEN NO ORGANISMS SEEN Performed at [facility]    Culture   Final    NO ANAEROBES ISOLATED Performed at [facility]    Report Status 09/13/2014 FINAL  Final     Hospital Course: This is an 81 year old who came to the hospital with abdominal pain. He had CT and ultrasound and had what appeared to be acute cholecystitis and question of a hepatic abscess. He was being prepared for surgery when he developed atrial fibrillation with rapid ventricular response.  He was treated for that and eventually was able to undergo open cholecystectomy. He was found to have gangrenous gallbladder. His medications were adjusted. Probably because of his atrial fibrillation with rapid ventricular response he developed acute diastolic heart failure. He continued improvement fairly rapidly and was ready for discharge. He had physical therapy evaluation and it was suggested that he go to a skilled care facility for rehabilitation but he refused that.  Discharge Exam: Blood pressure 150/69, pulse 72, temperature 98.8 F (37.1 C), temperature source Oral, resp. rate 20, height  (1.88 m), weight 104.237 kg (229 lb 12.8 oz), SpO2 96 %. He is awake and alert. His abdominal incision looks okay. He is still in atrial fibrillation but with a much lower heart rate. He has edema of the extremities.  Disposition: Home with home health services      Discharge Instructions    Discharge patient    Complete by:  As directed      Face-to-face encounter (required for Medicare/Medicaid patients)    Complete by:  As directed   I Irie Dowson L certify that this patient is under my care and that I, or a nurse practitioner or physician's assistant working with me, had a face-to-face encounter that meets the physician face-to-face encounter requirements with this patient on 09/13/2014. The encounter with the patient was in whole, or in part for the following medical condition(s) which is the primary reason for home health care (List medical condition): Acute cholecystitis/atrial fibrillation with rapid ventricular response/acute diastolic heart failure  The encounter with the patient was in whole, or in part, for the following medical condition, which is the primary reason for home health care:  Acute cholecystitis/atrial fibrillation with rapid ventricular response/acute diastolic heart failure  I certify that, based on my findings, the following services are medically necessary home  health services:   Nursing Physical therapy    Reason for Medically Necessary Home Health Services:  Skilled Nursing- Change/Decline in Patient Status  My clinical findings support the need for the above services:  Unable to leave home safely without assistance and/or assistive device  Further, I certify that my clinical findings support that  this patient is homebound due to:  Ambulates short distances less than 300 feet     Home Health    Complete by:  As directed   To provide the following care/treatments:   PT RN    He needs to be on heart failure protocol with daily weights. Basic metabolic profile on 09/15/2014 and 09/22/2014. CBC on 09/22/2014           Follow-up Information    Follow up with Dalia Heading, MD. Schedule an appointment as soon as possible for a visit on 09/20/2014.   Specialty:  General Surgery   Contact information:   [id number] [place] [address] [phone]       Signed: Fredirick Maudlin   09/13/2014, 9:16 AM

## 2014-09-14 NOTE — Care Management Utilization Note (Signed)
UR completed 

## 2014-09-23 ENCOUNTER — Encounter: Payer: Self-pay | Admitting: Adult Health

## 2014-09-23 ENCOUNTER — Ambulatory Visit (INDEPENDENT_AMBULATORY_CARE_PROVIDER_SITE_OTHER): Payer: Medicare Other | Admitting: Adult Health

## 2014-09-23 VITALS — BP 136/64 | HR 59 | Ht 74.0 in | Wt 213.0 lb

## 2014-09-23 DIAGNOSIS — E8770 Fluid overload, unspecified: Secondary | ICD-10-CM

## 2014-09-23 DIAGNOSIS — I480 Paroxysmal atrial fibrillation: Secondary | ICD-10-CM

## 2014-09-23 DIAGNOSIS — I5032 Chronic diastolic (congestive) heart failure: Secondary | ICD-10-CM

## 2014-09-23 NOTE — Patient Instructions (Addendum)
Your physician recommends that you schedule a follow-up appointment in: 1 month with Joni Reining NP   Take an extra dose of your lasix and potassium today and tomorrow as directed and then on Sunday, return to normal dose  Weigh yourself everyday same time in the morning  Get lab work a few days before your next visit (BMET)    Thank you for choosing [facility] !

## 2014-09-23 NOTE — Progress Notes (Deleted)
Name: Warren Mcdonald    DOB: [birth year]  Age: 81 y.o.  MR#: [id number]       PCP:  Fredirick Maudlin, MD      Insurance: Payor: [employer] MEDICARE / Plan: [facility] MEDICARE / Product Type: *No Product type* /   CC:    Chief Complaint  Patient presents with  . Atrial Fibrillation  . Congestive Heart Failure    Diastolic    VS Filed Vitals:   09/23/14 1301  BP: 136/64  Pulse: 59  Height:  (1.88 m)  Weight: 213 lb (96.616 kg)    Weights Current Weight  09/23/14 213 lb (96.616 kg)  09/13/14 229 lb 12.8 oz (104.237 kg)  06/27/14 207 lb (93.895 kg)    Blood Pressure  BP Readings from Last 3 Encounters:  09/23/14 136/64  09/13/14 148/68  06/27/14 171/77     Admit date:  (Not on file) Last encounter with RMR:  Visit date not found   Allergy Review of patient's allergies indicates no known allergies.  Current Outpatient Prescriptions  Medication Sig Dispense Refill  . apixaban (ELIQUIS) 5 MG TABS tablet Take 1 tablet (5 mg total) by mouth 2 (two) times daily. 60 tablet 5  . diltiazem (CARDIZEM CD) 180 MG 24 hr capsule Take 1 capsule (180 mg total) by mouth daily. 30 capsule 12  . furosemide (LASIX) 20 MG tablet Take 1 tablet (20 mg total) by mouth daily. 30 tablet 1  . HYDROcodone-acetaminophen (NORCO/VICODIN) 5-325 MG per tablet Take 1-2 tablets by mouth every 6 (six) hours as needed for moderate pain. 30 tablet 0  . metFORMIN (GLUCOPHAGE) 500 MG tablet Take 500 mg by mouth 2 (two) times daily with a meal.      . metoprolol (LOPRESSOR) 100 MG tablet Take 1 tablet (100 mg total) by mouth 2 (two) times daily. 60 tablet 12  . potassium chloride (K-DUR) 10 MEQ tablet Take 1 tablet (10 mEq total) by mouth daily. 30 tablet 12  . pravastatin (PRAVACHOL) 40 MG tablet Take 40 mg by mouth daily.       No current facility-administered medications for this visit.    Discontinued Meds:    Medications Discontinued During This Encounter  Medication Reason  .  amoxicillin-clavulanate (AUGMENTIN) 875-125 MG per tablet Error    Patient Active Problem List   Diagnosis Date Noted  . Acute diastolic heart failure 09/12/2014  . Cholecystitis, acute 09/06/2014  . Atrial fibrillation with rapid ventricular response 09/06/2014  . 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 Abdominal pain 09/02/2014  . DM (diabetes mellitus) 09/02/2014  . Pain in limb 12/27/2013  . Aftercare following surgery of the circulatory system, NEC 06/28/2013  . Atherosclerosis of native arteries of the extremities with intermittent claudication 06/28/2013  . Peripheral vascular disease 12/28/2012  . PAD (peripheral artery disease) 12/23/2011    LABS    Component Value Date/Time   NA 138 09/11/2014 0617   NA 138 09/10/2014 0433   NA 141 09/09/2014 0531   K 4.2 09/11/2014 0617   K 4.4 09/10/2014 0433   K 4.1 09/09/2014 0531   CL 110 09/11/2014 0617   CL 111 09/10/2014 0433   CL 114* 09/09/2014 0531   CO2 22 09/11/2014 0617   CO2 20 09/10/2014 0433   CO2 19 09/09/2014 0531   GLUCOSE 181* 09/11/2014 0617   GLUCOSE 162* 09/10/2014 0433   GLUCOSE 146* 09/09/2014 0531   BUN 20 09/11/2014 0617   BUN 20 09/10/2014 0433   BUN  16 09/09/2014 0531   CREATININE 1.35 09/11/2014 0617   CREATININE 1.40* 09/10/2014 0433   CREATININE 1.48* 09/09/2014 0531   CALCIUM 8.3* 09/11/2014 0617   CALCIUM 8.0* 09/10/2014 0433   CALCIUM 7.8* 09/09/2014 0531   GFRNONAA 47* 09/11/2014 0617   GFRNONAA 45* 09/10/2014 0433   GFRNONAA 42* 09/09/2014 0531   GFRAA 55* 09/11/2014 0617   GFRAA 52* 09/10/2014 0433   GFRAA 49* 09/09/2014 0531   CMP     Component Value Date/Time   NA 138 09/11/2014 0617   K 4.2 09/11/2014 0617   CL 110 09/11/2014 0617   CO2 22 09/11/2014 0617   GLUCOSE 181* 09/11/2014 0617   BUN 20 09/11/2014 0617   CREATININE 1.35 09/11/2014 0617   CALCIUM 8.3* 09/11/2014 0617   PROT 6.4 09/11/2014 0616   ALBUMIN 2.0* 09/11/2014 0616   AST 24 09/11/2014 0616   ALT 33 09/11/2014  0616   ALKPHOS 141* 09/11/2014 0616   BILITOT 0.8 09/11/2014 0616   GFRNONAA 47* 09/11/2014 0617   GFRAA 55* 09/11/2014 0617       Component Value Date/Time   WBC 8.8 09/11/2014 0617   WBC 8.5 09/10/2014 0433   WBC 7.1 09/09/2014 0531   HGB 8.2* 09/11/2014 0617   HGB 8.1* 09/10/2014 0433   HGB 8.2* 09/09/2014 0531   HCT 24.9* 09/11/2014 0617   HCT 24.9* 09/10/2014 0433   HCT 25.2* 09/09/2014 0531   MCV 91.5 09/11/2014 0617   MCV 91.9 09/10/2014 0433   MCV 91.3 09/09/2014 0531    Lipid Panel  No results found for: CHOL, TRIG, HDL, CHOLHDL, VLDL, LDLCALC, LDLDIRECT  ABG    Component Value Date/Time   TCO2 24 12/06/2009 0722     Lab Results  Component Value Date   TSH 1.682 09/05/2014   BNP (last 3 results) No results for input(s): BNP in the last 8760 hours.  ProBNP (last 3 results) No results for input(s): PROBNP in the last 8760 hours.  Cardiac Panel (last 3 results) No results for input(s): CKTOTAL, CKMB, TROPONINI, RELINDX in the last 72 hours.  Iron/TIBC/Ferritin/ %Sat No results found for: IRON, TIBC, FERRITIN, IRONPCTSAT   EKG Orders placed or performed during the hospital encounter of 09/02/14  . EKG 12-Lead  . EKG 12-Lead  . EKG 12-Lead  . EKG 12-Lead  . EKG 12-Lead  . EKG 12-Lead  . EKG 12-Lead  . EKG 12-Lead  . EKG 12-Lead  . EKG 12-Lead  . EKG 12-Lead  . EKG 12-Lead     Prior Assessment and Plan Problem List as of 09/23/2014      Cardiovascular and Mediastinum   PAD (peripheral artery disease)   Peripheral vascular disease   Atherosclerosis of native arteries of the extremities with intermittent claudication   Atrial fibrillation with rapid ventricular response   Acute diastolic heart failure     Digestive   Hepatic abscess   Cholecystitis, acute     Endocrine   DM (diabetes mellitus)     Other   Aftercare following surgery of the circulatory system, NEC   Pain in limb   Abdominal pain       Imaging: Dg Chest 2  View  09/03/2014   CLINICAL DATA:  Abdominal pain, liver abscess  EXAM: CHEST  2 VIEW  COMPARISON:  09/02/2014 images of the lung bases  FINDINGS: Cardiomediastinal silhouette is stable. No acute infiltrate or pleural effusion. No pulmonary edema. Mild degenerative changes thoracic spine.  IMPRESSION: No active cardiopulmonary disease.  Electronically Signed   By: Natasha Mead M.D.   On: 09/03/2014 12:56   Ct Abdomen Pelvis W Contrast  09/02/2014   CLINICAL DATA:  Sharp pain just above the umbilicus for 2 weeks. Constipation with no bowel movement for 1 week. Periumbilical abdominal pain.  EXAM: CT ABDOMEN AND PELVIS WITH CONTRAST  TECHNIQUE: Multidetector CT imaging of the abdomen and pelvis was performed using the standard protocol following bolus administration of intravenous contrast.  CONTRAST:  50mL OMNIPAQUE IOHEXOL 300 MG/ML SOLN, OMNIPAQUE IOHEXOL 300 MG/ML SOLN  COMPARISON:  08/05/2008  FINDINGS: Mild dependent atelectasis or fibrosis in the lung bases. Mild bronchiectasis. Coronary artery calcifications.  Cholelithiasis with distended gallbladder and mild gallbladder wall thickening. This may indicate acute cholecystitis. Since the previous study, there is interval development of a heterogeneous hypo attenuating collection in the medial segment left lobe of liver measuring about 3 x 3.3 cm diameter. This is adjacent to the gallbladder. Differential diagnosis includes hepatic abscess, infiltrating gallbladder neoplasm, or metastasis. Suggest MRI for further characterization. No other focal liver lesions demonstrated. No bile duct dilatation. The pancreas, spleen, adrenal glands, inferior vena cava, and retroperitoneal lymph nodes are unremarkable. Calcific and noncalcific atherosclerotic changes in the abdominal aorta without aneurysm. Multiple cysts in the kidneys. No hydronephrosis. Stomach, small bowel, and colon are not abnormally distended. No free air or free fluid in the abdomen.  Pelvis:  Bladder wall is not thickened. Diffuse enlargement of the prostate gland at 5.4 x 7.2 cm. No free or loculated pelvic fluid collections. No pelvic lymphadenopathy. Appendix is surgically absent. No evidence of diverticulitis. Degenerative changes in the spine. No destructive bone lesions.  IMPRESSION: Gallbladder distention with cholelithiasis and gallbladder wall thickening may indicate acute cholecystitis. Heterogeneous hypo attenuating septated lesion demonstrated in the medial segment left lobe of the liver adjacent to the gallbladder. Appearance is most likely to represent hepatic abscess although differential diagnosis would include infiltrating gallbladder neoplasm or metastasis. Suggest MRI for further evaluation. Diffuse enlargement of prostate gland. No bowel obstruction.   Electronically Signed   By: Burman Nieves M.D.   On: 09/02/2014 21:31

## 2014-09-23 NOTE — Progress Notes (Signed)
Cardiology Office Note   Date:  09/23/2014   ID:  Warren Mcdonald, DOB [birth year], MRN [id number]  PCP:  Fredirick Maudlin, MD  Cardiologist: Arlington Calix, NP   Chief Complaint  Patient presents with  . Atrial Fibrillation  . Congestive Heart Failure    Diastolic      History of Present Illness: Warren Mcdonald is a 81 y.o. male who presents for and management of atrial fibrillation, with RVR, acute diastolic heart failure, after admission for acute cholecystitis with hepatic abscess, AKI, was found to have paroxysmal atrial fibrillation with RVR. CHADS VASC Score 5.  The patient was treated with IV diltiazem and transitioned to by mouth 180 mg daily and continued on Lopressor 100 mg twice a day.   Started on Eliquis 5 mg BID after cholecystectomy.  The patient had evidence of diastolic heart failure, and with diuresis, approximately 11 pounds during hospitalization.  He was sent home on Lasix 20 mg daily with potassium replacement in addition to medications.  Discussed.  He is here for post hospitalization followup.  Today he is without complaints of palpitations, shortness of breath, or pain.  He is medically compliant as he has family members who are providing his medications and making sure he takes them.  He is deconditioned, and has not been up walking very much.  He comes today in a wheelchair.   Past Medical History  Diagnosis Date  . Type 2 diabetes mellitus   . Essential hypertension   . Hyperlipidemia   . PUD (peptic ulcer disease)   . Peripheral vascular disease 2011    Left above-knee popliteal to posterior tibial artery bypass     Past Surgical History  Procedure Laterality Date  . Appendectomy    . Cataract extraction w/ intraocular lens  implant, bilateral    . Pr vein bypass graft,aorto-fem-pop      Left above knee popliteal to posterior tibial artery bypass graft  . Eye surgery    . Cholecystectomy N/A 09/08/2014    Procedure: CHOLECYSTECTOMY;   Surgeon: Franky Macho Md, MD;  Location: [facility];  Service: General;  Laterality: N/A;     Current Outpatient Prescriptions  Medication Sig Dispense Refill  . apixaban (ELIQUIS) 5 MG TABS tablet Take 1 tablet (5 mg total) by mouth 2 (two) times daily. 60 tablet 5  . diltiazem (CARDIZEM CD) 180 MG 24 hr capsule Take 1 capsule (180 mg total) by mouth daily. 30 capsule 12  . furosemide (LASIX) 20 MG tablet Take 1 tablet (20 mg total) by mouth daily. 30 tablet 1  . HYDROcodone-acetaminophen (NORCO/VICODIN) 5-325 MG per tablet Take 1-2 tablets by mouth every 6 (six) hours as needed for moderate pain. 30 tablet 0  . metFORMIN (GLUCOPHAGE) 500 MG tablet Take 500 mg by mouth 2 (two) times daily with a meal.      . metoprolol (LOPRESSOR) 100 MG tablet Take 1 tablet (100 mg total) by mouth 2 (two) times daily. 60 tablet 12  . potassium chloride (K-DUR) 10 MEQ tablet Take 1 tablet (10 mEq total) by mouth daily. 30 tablet 12  . pravastatin (PRAVACHOL) 40 MG tablet Take 40 mg by mouth daily.       No current facility-administered medications for this visit.    Allergies:   Review of patient's allergies indicates no known allergies.    Social History:  The patient  reports that he quit smoking about 26 years ago. His smoking use included Cigars. He quit smokeless tobacco  use about 26 years ago. His smokeless tobacco use included Chew. He reports that he does not drink alcohol or use illicit drugs.   Family History:  The patient's family history includes Cancer in his brother, daughter, and sister; Deep vein thrombosis in his sister; Diabetes in his daughter, other, and sister; Heart attack in his father and mother; Heart disease in his father and mother; Hypertension in his mother; Prostate cancer in his other.    ROS: .   All other systems are reviewed and negative.Unless otherwise mentioned in H&P above.   PHYSICAL EXAM: VS:  BP 136/64 mmHg  Pulse 59  Ht 6\' 2"  (1.88 m)  Wt 213 lb (96.616 kg)   BMI 27.34 kg/m2 , BMI Body mass index is 27.34 kg/(m^2). GEN: Well nourished, well developed, in no acute distress HEENT: normal Neck: no JVD, carotid bruits, or masses Cardiac:RRR; no murmurs,with tricuspid regurgitant murmur, Lungs: Clear to auscultation bilaterally, normal work of breathing GI: soft, nontender, nondistended, + BS MS: no deformity or atrophy2+ pitting edema, bilaterally to the knees. Skin: warm and dry, no rash Neuro:  Diminished strength,  sensation are intact Psych: euthymic mood, full affect  Recent Labs: 09/05/2014: TSH 1.682 09/09/2014: Magnesium 1.5 09/11/2014: ALT 33; BUN 20; Creatinine 1.35; Hemoglobin 8.2*; Platelets 331; Potassium 4.2; Sodium 138    Wt Readings from Last 3 Encounters:  09/23/14 213 lb (96.616 kg)  09/13/14 229 lb 12.8 oz (104.237 kg)  06/27/14 207 lb (93.895 kg)      Other studies Reviewed: Additional studies/ records that were reviewed today include: None Review of the above records demonstrates: None   ASSESSMENT AND PLAN:  1. Atrial fibrillation, with RVR: :Remains in normal sinus rhythm on diltiazem and metoprolol.  He is tolerating apixaban without evidence of bleeding or bruising.  2. LEE Bilateral: highly suspected.  Some mild lower extremity edema, while on diltiazem, but he has 2+ pitting edema to his knees.  He is taking Lasix 20 mg daily, will increase his Lasix to 40 mg daily for 2 days, and have him take extra potassium with this.  Have advised his family members to get a digital scale and weigh him daily.  I bring down his current weight at 213 pounds here in the clinic.  He admits to eating some salty foods to include chicken pot pie.  He has been advised on a low sodium diet.  Family members will call if he gains 3-5 pounds in a 24-48 hour period.  We will see him again in one month for ongoing evaluation.followup BMET in one month before next appointment  3. Deconditioning: He comes today wheelchair, and is significantly  deconditioned.  He is doing seated, exercises and is beginning to walk a little bit with a cane.  I have encouraged him to increase his activity as much as he can to avoid worsening disability.  He may need home PT, but will defer to primary care physician, to institute it.  He finds this is necessary    Current medicines are reviewed at length with the patient today.    Labs/ tests ordered today include: BMET No orders of the defined types were placed in this encounter.     Disposition:   FU with 1 month.  Signed, Joni Reining, NP  09/23/2014 1:11 PM    [facility] [address] [place], [place] [address] Phone: [phone]; Fax: [phone]

## 2014-10-25 ENCOUNTER — Ambulatory Visit (INDEPENDENT_AMBULATORY_CARE_PROVIDER_SITE_OTHER): Payer: Medicare Other | Admitting: Adult Health

## 2014-10-25 ENCOUNTER — Encounter: Payer: Self-pay | Admitting: Adult Health

## 2014-10-25 VITALS — BP 144/70 | HR 64 | Ht 74.0 in | Wt 197.0 lb

## 2014-10-25 DIAGNOSIS — I5032 Chronic diastolic (congestive) heart failure: Secondary | ICD-10-CM

## 2014-10-25 DIAGNOSIS — I480 Paroxysmal atrial fibrillation: Secondary | ICD-10-CM

## 2014-10-25 NOTE — Patient Instructions (Signed)
Your physician wants you to follow-up in: 6 months with Kathryn Lawrence, NP. You will receive a reminder letter in the mail two months in advance. If you don't receive a letter, please call our office to schedule the follow-up appointment.  Your physician recommends that you continue on your current medications as directed. Please refer to the Current Medication list given to you today.  Thank you for choosing [facility]!   

## 2014-10-25 NOTE — Progress Notes (Deleted)
Name: Warren Mcdonald    DOB: 1929/06/23  Age: 81 y.o.  MR#: [id number]       PCP:  Fredirick Maudlin, MD      Insurance: Payor: [employer] MEDICARE / Plan: [facility] MEDICARE / Product Type: *No Product type* /   CC:    Chief Complaint  Patient presents with  . Atrial Fibrillation  . Congestive Heart Failure    Diastolic    VS Filed Vitals:   10/25/14 1301  BP: 144/70  Pulse: 64  Height:  (1.88 m)  Weight: 197 lb (89.359 kg)  SpO2: 99%    Weights Current Weight  10/25/14 197 lb (89.359 kg)  09/23/14 213 lb (96.616 kg)  09/13/14 229 lb 12.8 oz (104.237 kg)    Blood Pressure  BP Readings from Last 3 Encounters:  10/25/14 144/70  09/23/14 136/64  09/13/14 148/68     Admit date:  (Not on file) Last encounter with RMR:  09/23/2014   Allergy Review of patient's allergies indicates no known allergies.  Current Outpatient Prescriptions  Medication Sig Dispense Refill  . apixaban (ELIQUIS) 5 MG TABS tablet Take 1 tablet (5 mg total) by mouth 2 (two) times daily. 60 tablet 5  . diltiazem (CARDIZEM CD) 180 MG 24 hr capsule Take 1 capsule (180 mg total) by mouth daily. 30 capsule 12  . furosemide (LASIX) 20 MG tablet Take 1 tablet (20 mg total) by mouth daily. 30 tablet 1  . HYDROcodone-acetaminophen (NORCO/VICODIN) 5-325 MG per tablet Take 1-2 tablets by mouth every 6 (six) hours as needed for moderate pain. 30 tablet 0  . metFORMIN (GLUCOPHAGE) 500 MG tablet Take 500 mg by mouth 2 (two) times daily with a meal.      . metoprolol (LOPRESSOR) 100 MG tablet Take 1 tablet (100 mg total) by mouth 2 (two) times daily. 60 tablet 12  . potassium chloride (K-DUR) 10 MEQ tablet Take 1 tablet (10 mEq total) by mouth daily. 30 tablet 12  . pravastatin (PRAVACHOL) 40 MG tablet Take 40 mg by mouth daily.       No current facility-administered medications for this visit.    Discontinued Meds:   There are no discontinued medications.  Patient Active Problem List   Diagnosis Date  Noted  . Acute diastolic heart failure 09/12/2014  . Cholecystitis, acute 09/06/2014  . Atrial fibrillation with rapid ventricular response 09/06/2014  . 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 Abdominal pain 09/02/2014  . DM (diabetes mellitus) 09/02/2014  . Pain in limb 12/27/2013  . Aftercare following surgery of the circulatory system, NEC 06/28/2013  . Atherosclerosis of native arteries of the extremities with intermittent claudication 06/28/2013  . Peripheral vascular disease 12/28/2012  . PAD (peripheral artery disease) 12/23/2011    LABS    Component Value Date/Time   NA 138 09/11/2014 0617   NA 138 09/10/2014 0433   NA 141 09/09/2014 0531   K 4.2 09/11/2014 0617   K 4.4 09/10/2014 0433   K 4.1 09/09/2014 0531   CL 110 09/11/2014 0617   CL 111 09/10/2014 0433   CL 114* 09/09/2014 0531   CO2 22 09/11/2014 0617   CO2 20 09/10/2014 0433   CO2 19 09/09/2014 0531   GLUCOSE 181* 09/11/2014 0617   GLUCOSE 162* 09/10/2014 0433   GLUCOSE 146* 09/09/2014 0531   BUN 20 09/11/2014 0617   BUN 20 09/10/2014 0433   BUN 16 09/09/2014 0531   CREATININE 1.35 09/11/2014 0617   CREATININE 1.40* 09/10/2014 9147  CREATININE 1.48* 09/09/2014 0531   CALCIUM 8.3* 09/11/2014 0617   CALCIUM 8.0* 09/10/2014 0433   CALCIUM 7.8* 09/09/2014 0531   GFRNONAA 47* 09/11/2014 0617   GFRNONAA 45* 09/10/2014 0433   GFRNONAA 42* 09/09/2014 0531   GFRAA 55* 09/11/2014 0617   GFRAA 52* 09/10/2014 0433   GFRAA 49* 09/09/2014 0531   CMP     Component Value Date/Time   NA 138 09/11/2014 0617   K 4.2 09/11/2014 0617   CL 110 09/11/2014 0617   CO2 22 09/11/2014 0617   GLUCOSE 181* 09/11/2014 0617   BUN 20 09/11/2014 0617   CREATININE 1.35 09/11/2014 0617   CALCIUM 8.3* 09/11/2014 0617   PROT 6.4 09/11/2014 0616   ALBUMIN 2.0* 09/11/2014 0616   AST 24 09/11/2014 0616   ALT 33 09/11/2014 0616   ALKPHOS 141* 09/11/2014 0616   BILITOT 0.8 09/11/2014 0616   GFRNONAA 47* 09/11/2014 0617   GFRAA 55*  09/11/2014 0617       Component Value Date/Time   WBC 8.8 09/11/2014 0617   WBC 8.5 09/10/2014 0433   WBC 7.1 09/09/2014 0531   HGB 8.2* 09/11/2014 0617   HGB 8.1* 09/10/2014 0433   HGB 8.2* 09/09/2014 0531   HCT 24.9* 09/11/2014 0617   HCT 24.9* 09/10/2014 0433   HCT 25.2* 09/09/2014 0531   MCV 91.5 09/11/2014 0617   MCV 91.9 09/10/2014 0433   MCV 91.3 09/09/2014 0531    Lipid Panel  No results found for: CHOL, TRIG, HDL, CHOLHDL, VLDL, LDLCALC, LDLDIRECT  ABG    Component Value Date/Time   TCO2 24 12/06/2009 0722     Lab Results  Component Value Date   TSH 1.682 09/05/2014   BNP (last 3 results) No results for input(s): BNP in the last 8760 hours.  ProBNP (last 3 results) No results for input(s): PROBNP in the last 8760 hours.  Cardiac Panel (last 3 results) No results for input(s): CKTOTAL, CKMB, TROPONINI, RELINDX in the last 72 hours.  Iron/TIBC/Ferritin/ %Sat No results found for: IRON, TIBC, FERRITIN, IRONPCTSAT   EKG Orders placed or performed during the hospital encounter of 09/02/14  . EKG 12-Lead  . EKG 12-Lead  . EKG 12-Lead  . EKG 12-Lead  . EKG 12-Lead  . EKG 12-Lead  . EKG 12-Lead  . EKG 12-Lead  . EKG 12-Lead  . EKG 12-Lead  . EKG 12-Lead  . EKG 12-Lead     Prior Assessment and Plan Problem List as of 10/25/2014      Cardiovascular and Mediastinum   PAD (peripheral artery disease)   Peripheral vascular disease   Atherosclerosis of native arteries of the extremities with intermittent claudication   Atrial fibrillation with rapid ventricular response   Acute diastolic heart failure     Digestive   Hepatic abscess   Cholecystitis, acute     Endocrine   DM (diabetes mellitus)     Other   Aftercare following surgery of the circulatory system, NEC   Pain in limb   Abdominal pain       Imaging: No results found.

## 2014-10-25 NOTE — Progress Notes (Signed)
Cardiology Office Note   Date:  10/25/2014   ID:  DEVINE KLINGEL, DOB 06/28/1929, MRN [id number]  PCP:  Fredirick Maudlin, MD  Cardiologist:  Arlington Calix, NP   Chief Complaint  Patient presents with  . Atrial Fibrillation  . Congestive Heart Failure    Diastolic      History of Present Illness: Warren Mcdonald is a 81 y.o. male who presents for ongoing assessment and management of atrial fib with RVR, chronic diastolic heart failure, with admission in April for acute cholecystitis and hepatic abscess.  The patient is on Eliquis with CHADS VASC Score of 5.  He was last seen in the office on 09/23/2014, and was without complaint.  He is medically compliant.  He was found to be very deconditioned and was in a wheelchair at that time.  He has good family support, to assist him with medications.  He states he feels good. He is now using a cane for ambulation and is happy to be out of the wheel chair. He has some complaints of left shoulder pain with movement. He has arthritis and is going to see Dr. Juanetta Gosling on June 2. He is medically complaint.   Past Medical History  Diagnosis Date  . Type 2 diabetes mellitus   . Essential hypertension   . Hyperlipidemia   . PUD (peptic ulcer disease)   . Peripheral vascular disease 2011    Left above-knee popliteal to posterior tibial artery bypass     Past Surgical History  Procedure Laterality Date  . Appendectomy    . Cataract extraction w/ intraocular lens  implant, bilateral    . Pr vein bypass graft,aorto-fem-pop      Left above knee popliteal to posterior tibial artery bypass graft  . Eye surgery    . Cholecystectomy N/A 09/08/2014    Procedure: CHOLECYSTECTOMY;  Surgeon: Franky Macho Md, MD;  Location: [facility];  Service: General;  Laterality: N/A;     Current Outpatient Prescriptions  Medication Sig Dispense Refill  . apixaban (ELIQUIS) 5 MG TABS tablet Take 1 tablet (5 mg total) by mouth 2 (two) times daily. 60 tablet 5   . diltiazem (CARDIZEM CD) 180 MG 24 hr capsule Take 1 capsule (180 mg total) by mouth daily. 30 capsule 12  . furosemide (LASIX) 20 MG tablet Take 1 tablet (20 mg total) by mouth daily. 30 tablet 1  . HYDROcodone-acetaminophen (NORCO/VICODIN) 5-325 MG per tablet Take 1-2 tablets by mouth every 6 (six) hours as needed for moderate pain. 30 tablet 0  . metFORMIN (GLUCOPHAGE) 500 MG tablet Take 500 mg by mouth 2 (two) times daily with a meal.      . metoprolol (LOPRESSOR) 100 MG tablet Take 1 tablet (100 mg total) by mouth 2 (two) times daily. 60 tablet 12  . potassium chloride (K-DUR) 10 MEQ tablet Take 1 tablet (10 mEq total) by mouth daily. 30 tablet 12  . pravastatin (PRAVACHOL) 40 MG tablet Take 40 mg by mouth daily.       No current facility-administered medications for this visit.    Allergies:   Review of patient's allergies indicates no known allergies.    Social History:  The patient  reports that he quit smoking about 26 years ago. His smoking use included Cigars. He quit smokeless tobacco use about 26 years ago. His smokeless tobacco use included Chew. He reports that he does not drink alcohol or use illicit drugs.   Family History:  The patient's family history  includes Cancer in his brother, daughter, and sister; Deep vein thrombosis in his sister; Diabetes in his daughter, other, and sister; Heart attack in his father and mother; Heart disease in his father and mother; Hypertension in his mother; Prostate cancer in his other.    ROS: .   All other systems are reviewed and negative.Unless otherwise mentioned in H&P above.   PHYSICAL EXAM: VS:  BP 144/70 mmHg  Pulse 64  Ht 6\' 2"  (1.88 m)  Wt 197 lb (89.359 kg)  BMI 25.28 kg/m2  SpO2 99% , BMI Body mass index is 25.28 kg/(m^2). GEN: Well nourished, well developed, in no acute distress HEENT: normal Neck: no JVD, carotid bruits, or masses Cardiac: RRR, occasional extra systole; no murmurs, rubs, or gallops,no edema   Respiratory:  Clear to auscultation bilaterally, normal work of breathing GI: soft, nontender, nondistended, + BS MS: no deformity or atrophyPain with ROM of left shoulder. Skin: warm and dry, no rash Neuro:  Strength and sensation are intact Psych: euthymic mood, full affect  Recent Labs: 09/05/2014: TSH 1.682 09/09/2014: Magnesium 1.5 09/11/2014: ALT 33; BUN 20; Creatinine 1.35; Hemoglobin 8.2*; Platelets 331; Potassium 4.2; Sodium 138    Lipid Panel No results found for: CHOL, TRIG, HDL, CHOLHDL, VLDL, LDLCALC, LDLDIRECT    Wt Readings from Last 3 Encounters:  10/25/14 197 lb (89.359 kg)  09/23/14 213 lb (96.616 kg)  09/13/14 229 lb 12.8 oz (104.237 kg)      Other studies Reviewed: Additional studies/ records that were reviewed today include: None Review of the above records demonstrates: N/A   ASSESSMENT AND PLAN:  1. Atrial fib: He is in normal rhythm by ascultation, with occasional extra systole. He is continued on Eliquis. No evidence of bleeding. He is without complaint. Will continue his current regimen. See him in 6 months.  2. Diastolic CHF: He has no evidence of decompensation. He has lost about 14 lbs since last office visit. He is avoiding salt. He will continue lasix as directed.    Current medicines are reviewed at length with the patient today.    Labs/ tests ordered today include: None No orders of the defined types were placed in this encounter.     Disposition:   FU with 6 months  Signed, Joni Reining, NP  10/25/2014 1:21 PM    [facility] [address] [place], [place] [address] Phone: [phone]; Fax: [phone]

## 2014-12-27 LAB — CBC: RBC: 4.09 (ref 3.87–5.11)

## 2014-12-27 LAB — CBC AND DIFFERENTIAL
HCT: 36 — AB (ref 41–53)
Hemoglobin: 11.6 — AB (ref 13.5–17.5)
Platelets: 243 (ref 150–399)
WBC: 4.8

## 2015-04-14 ENCOUNTER — Ambulatory Visit (INDEPENDENT_AMBULATORY_CARE_PROVIDER_SITE_OTHER): Payer: Medicare Other | Admitting: Adult Health

## 2015-04-14 ENCOUNTER — Encounter: Payer: Self-pay | Admitting: Adult Health

## 2015-04-14 VITALS — BP 184/78 | HR 67 | Ht 74.0 in | Wt 206.0 lb

## 2015-04-14 DIAGNOSIS — I480 Paroxysmal atrial fibrillation: Secondary | ICD-10-CM | POA: Diagnosis not present

## 2015-04-14 DIAGNOSIS — Z91199 Patient's noncompliance with other medical treatment and regimen due to unspecified reason: Secondary | ICD-10-CM

## 2015-04-14 DIAGNOSIS — I10 Essential (primary) hypertension: Secondary | ICD-10-CM | POA: Diagnosis not present

## 2015-04-14 DIAGNOSIS — Z79899 Other long term (current) drug therapy: Secondary | ICD-10-CM | POA: Diagnosis not present

## 2015-04-14 DIAGNOSIS — Z9119 Patient's noncompliance with other medical treatment and regimen: Secondary | ICD-10-CM

## 2015-04-14 MED ORDER — AMLODIPINE BESYLATE 10 MG PO TABS: 10.0000 mg | ORAL_TABLET | Freq: Every day | ORAL | Status: DC

## 2015-04-14 NOTE — Patient Instructions (Addendum)
Your physician recommends that you schedule a follow-up appointment in: 1 Month   Your physician has recommended you make the following change in your medication:   Norvasc 10 mg Daily  Lasix 20 mg Daily   Your physician recommends that you return for lab work in: 1 week ( BMET)   If you need a refill on your cardiac medications before your next appointment, please call your pharmacy.  You have been referred to [facility]  Thank you for choosing [facility]!

## 2015-04-14 NOTE — Progress Notes (Deleted)
Name: Warren Mcdonald    DOB: 09/09/1929  Age: 81 y.o.  MR#: [id number]       PCP:  Fredirick Maudlin, MD      Insurance: Payor: [employer] MEDICARE / Plan: [facility] MEDICARE / Product Type: *No Product type* /   CC:    Chief Complaint  Patient presents with  . Atrial Fibrillation  . Congestive Heart Failure    VS Filed Vitals:   04/14/15 1503  BP: 184/78  Pulse: 67  Height: 6\' 2"  (1.88 m)  Weight: 206 lb (93.441 kg)  SpO2: 99%    Weights Current Weight  04/14/15 206 lb (93.441 kg)  10/25/14 197 lb (89.359 kg)  09/23/14 213 lb (96.616 kg)    Blood Pressure  BP Readings from Last 3 Encounters:  04/14/15 184/78  10/25/14 144/70  09/23/14 136/64     Admit date:  (Not on file) Last encounter with RMR:  10/25/2014   Allergy Review of patient's allergies indicates no known allergies.  Current Outpatient Prescriptions  Medication Sig Dispense Refill  . furosemide (LASIX) 20 MG tablet Take 1 tablet (20 mg total) by mouth daily. 30 tablet 1  . HYDROcodone-acetaminophen (NORCO/VICODIN) 5-325 MG per tablet Take 1-2 tablets by mouth every 6 (six) hours as needed for moderate pain. 30 tablet 0  . metFORMIN (GLUCOPHAGE) 500 MG tablet Take 500 mg by mouth 2 (two) times daily with a meal.      . metoprolol (LOPRESSOR) 100 MG tablet Take 1 tablet (100 mg total) by mouth 2 (two) times daily. 60 tablet 12  . potassium chloride (K-DUR) 10 MEQ tablet Take 1 tablet (10 mEq total) by mouth daily. 30 tablet 12  . pravastatin (PRAVACHOL) 40 MG tablet Take 40 mg by mouth daily.      Marland Kitchen apixaban (ELIQUIS) 5 MG TABS tablet Take 1 tablet (5 mg total) by mouth 2 (two) times daily. (Patient not taking: Reported on 04/14/2015) 60 tablet 5  . diltiazem (CARDIZEM CD) 180 MG 24 hr capsule Take 1 capsule (180 mg total) by mouth daily. (Patient not taking: Reported on 04/14/2015) 30 capsule 12   No current facility-administered medications for this visit.    Discontinued Meds:   There are no  discontinued medications.  Patient Active Problem List   Diagnosis Date Noted  . Acute diastolic heart failure (HCC) 09/12/2014  . Cholecystitis, acute 09/06/2014  . Atrial fibrillation with rapid ventricular response (HCC) 09/06/2014  . 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 Abdominal pain 09/02/2014  . DM (diabetes mellitus) (HCC) 09/02/2014  . Pain in limb 12/27/2013  . Aftercare following surgery of the circulatory system, NEC 06/28/2013  . Atherosclerosis of native arteries of the extremities with intermittent claudication 06/28/2013  . Peripheral vascular disease (HCC) 12/28/2012  . PAD (peripheral artery disease) (HCC) 12/23/2011    LABS    Component Value Date/Time   NA 138 09/11/2014 0617   NA 138 09/10/2014 0433   NA 141 09/09/2014 0531   K 4.2 09/11/2014 0617   K 4.4 09/10/2014 0433   K 4.1 09/09/2014 0531   CL 110 09/11/2014 0617   CL 111 09/10/2014 0433   CL 114* 09/09/2014 0531   CO2 22 09/11/2014 0617   CO2 20 09/10/2014 0433   CO2 19 09/09/2014 0531   GLUCOSE 181* 09/11/2014 0617   GLUCOSE 162* 09/10/2014 0433   GLUCOSE 146* 09/09/2014 0531   BUN 20 09/11/2014 0617   BUN 20 09/10/2014 0433   BUN 16 09/09/2014 0531  CREATININE 1.35 09/11/2014 0617   CREATININE 1.40* 09/10/2014 0433   CREATININE 1.48* 09/09/2014 0531   CALCIUM 8.3* 09/11/2014 0617   CALCIUM 8.0* 09/10/2014 0433   CALCIUM 7.8* 09/09/2014 0531   GFRNONAA 47* 09/11/2014 0617   GFRNONAA 45* 09/10/2014 0433   GFRNONAA 42* 09/09/2014 0531   GFRAA 55* 09/11/2014 0617   GFRAA 52* 09/10/2014 0433   GFRAA 49* 09/09/2014 0531   CMP     Component Value Date/Time   NA 138 09/11/2014 0617   K 4.2 09/11/2014 0617   CL 110 09/11/2014 0617   CO2 22 09/11/2014 0617   GLUCOSE 181* 09/11/2014 0617   BUN 20 09/11/2014 0617   CREATININE 1.35 09/11/2014 0617   CALCIUM 8.3* 09/11/2014 0617   PROT 6.4 09/11/2014 0616   ALBUMIN 2.0* 09/11/2014 0616   AST 24 09/11/2014 0616   ALT 33 09/11/2014 0616    ALKPHOS 141* 09/11/2014 0616   BILITOT 0.8 09/11/2014 0616   GFRNONAA 47* 09/11/2014 0617   GFRAA 55* 09/11/2014 0617       Component Value Date/Time   WBC 8.8 09/11/2014 0617   WBC 8.5 09/10/2014 0433   WBC 7.1 09/09/2014 0531   HGB 8.2* 09/11/2014 0617   HGB 8.1* 09/10/2014 0433   HGB 8.2* 09/09/2014 0531   HCT 24.9* 09/11/2014 0617   HCT 24.9* 09/10/2014 0433   HCT 25.2* 09/09/2014 0531   MCV 91.5 09/11/2014 0617   MCV 91.9 09/10/2014 0433   MCV 91.3 09/09/2014 0531    Lipid Panel  No results found for: CHOL, TRIG, HDL, CHOLHDL, VLDL, LDLCALC, LDLDIRECT  ABG    Component Value Date/Time   TCO2 24 12/06/2009 0722     Lab Results  Component Value Date   TSH 1.682 09/05/2014   BNP (last 3 results) No results for input(s): BNP in the last 8760 hours.  ProBNP (last 3 results) No results for input(s): PROBNP in the last 8760 hours.  Cardiac Panel (last 3 results) No results for input(s): CKTOTAL, CKMB, TROPONINI, RELINDX in the last 72 hours.  Iron/TIBC/Ferritin/ %Sat No results found for: IRON, TIBC, FERRITIN, IRONPCTSAT   EKG Orders placed or performed during the hospital encounter of 09/02/14  . EKG 12-Lead  . EKG 12-Lead  . EKG 12-Lead  . EKG 12-Lead  . EKG 12-Lead  . EKG 12-Lead  . EKG 12-Lead  . EKG 12-Lead  . EKG 12-Lead  . EKG 12-Lead  . EKG 12-Lead  . EKG 12-Lead     Prior Assessment and Plan Problem List as of 04/14/2015      Cardiovascular and Mediastinum   PAD (peripheral artery disease) (HCC)   Peripheral vascular disease (HCC)   Atherosclerosis of native arteries of the extremities with intermittent claudication   Atrial fibrillation with rapid ventricular response (HCC)   Acute diastolic heart failure (HCC)     Digestive   Hepatic abscess   Cholecystitis, acute     Endocrine   DM (diabetes mellitus) (HCC)     Other   Aftercare following surgery of the circulatory system, NEC   Pain in limb   Abdominal pain        Imaging: No results found.

## 2015-04-14 NOTE — Progress Notes (Signed)
Cardiology Office Note   Date:  04/14/2015   ID:  Warren Mcdonald, DOB [birth year], MRN [id number]  PCP:  Warren Maudlin, Mcdonald  Cardiologist: Warren Calix, NP   Chief Complaint  Patient presents with  . Atrial Fibrillation  . Congestive Heart Failure      History of Present Illness: Warren Mcdonald is a 81 y.o. male who presents for for ongoing assessment and management of atrial fib with RVR, CHADS VASC Score of 5 on Eliquis, chronic diastolic heart failure. He comes today  confused about his medications. He has not picked up medications from pharmacy since April. BP is elevated. He denies chest pain or blurred vision. He only takes diabetic medications, metoprolol, and statin.  Not sure if he has lasix.   Past Medical History  Diagnosis Date  . Type 2 diabetes mellitus (HCC)   . Essential hypertension   . Hyperlipidemia   . PUD (peptic ulcer disease)   . Peripheral vascular disease (HCC) 2011    Left above-knee popliteal to posterior tibial artery bypass     Past Surgical History  Procedure Laterality Date  . Appendectomy    . Cataract extraction w/ intraocular lens  implant, bilateral    . Pr vein bypass graft,aorto-fem-pop      Left above knee popliteal to posterior tibial artery bypass graft  . Eye surgery    . Cholecystectomy N/A 09/08/2014    Procedure: CHOLECYSTECTOMY;  Surgeon: Warren Macho Md, Mcdonald;  Location: [facility];  Service: General;  Laterality: N/A;     Current Outpatient Prescriptions  Medication Sig Dispense Refill  . furosemide (LASIX) 20 MG tablet Take 1 tablet (20 mg total) by mouth daily. 30 tablet 1  . HYDROcodone-acetaminophen (NORCO/VICODIN) 5-325 MG per tablet Take 1-2 tablets by mouth every 6 (six) hours as needed for moderate pain. 30 tablet 0  . metFORMIN (GLUCOPHAGE) 500 MG tablet Take 500 mg by mouth 2 (two) times daily with a meal.      . metoprolol (LOPRESSOR) 100 MG tablet Take 1 tablet (100 mg total) by mouth 2 (two) times  daily. 60 tablet 12  . potassium chloride (K-DUR) 10 MEQ tablet Take 1 tablet (10 mEq total) by mouth daily. 30 tablet 12  . pravastatin (PRAVACHOL) 40 MG tablet Take 40 mg by mouth daily.      Marland Kitchen amLODipine (NORVASC) 10 MG tablet Take 1 tablet (10 mg total) by mouth daily. 180 tablet 3  . apixaban (ELIQUIS) 5 MG TABS tablet Take 1 tablet (5 mg total) by mouth 2 (two) times daily. (Patient not taking: Reported on 04/14/2015) 60 tablet 5   No current facility-administered medications for this visit.    Allergies:   Review of patient's allergies indicates no known allergies.    Social History:  The patient  reports that he quit smoking about 26 years ago. His smoking use included Cigars. He quit smokeless tobacco use about 26 years ago. His smokeless tobacco use included Chew. He reports that he does not drink alcohol or use illicit drugs.   Family History:  The patient's family history includes Cancer in his brother, daughter, and sister; Deep vein thrombosis in his sister; Diabetes in his daughter, other, and sister; Heart attack in his father and mother; Heart disease in his father and mother; Hypertension in his mother; Prostate cancer in his other.    ROS: All other systems are reviewed and negative. Unless otherwise mentioned in H&P    PHYSICAL EXAM: VS:  BP  184/78 mmHg  Pulse 67  Ht  (1.88 m)  Wt 206 lb (93.441 kg)  BMI 26.44 kg/m2  SpO2 99% , BMI Body mass index is 26.44 kg/(m^2). GEN: Well nourished, well developed, in no acute distress HEENT: normal Neck: no JVD, carotid bruits, or masses Cardiac: RRR; no murmurs, rubs, or gallops,no edema  Respiratory:  clear to auscultation bilaterally, normal work of breathing GI: soft, nontender, nondistended, + BS MS: no deformity or atrophy Skin: warm and dry, no rash Neuro:  Strength and sensation are intact Psych: euthymic mood, full affect   Recent Labs: 09/05/2014: TSH 1.682 09/09/2014: Magnesium 1.5 09/11/2014: ALT 33;  BUN 20; Creatinine, Ser 1.35; Hemoglobin 8.2*; Platelets 331; Potassium 4.2; Sodium 138     Wt Readings from Last 3 Encounters:  04/14/15 206 lb (93.441 kg)  10/25/14 197 lb (89.359 kg)  09/23/14 213 lb (96.616 kg)       ASSESSMENT AND PLAN:  1.  PAF: CHADS VASC Score of 5. I have given him samples of Eliquis. He will fill out paperwork to have medication assistance. He will continue metoprolol as directed.   2. Hypertension: He has not taken diltiazem or amlodipine for 4 -5 months. I will restart amlodipine 10 mg daily. Lasix. Will repeat his BMET.   3. Diastolic CHF: No evidence of fluid retention.   4. Non-compliance: He will have [facility] see him to review his medications and assess needs to optimize health and compliance.    Current medicines are reviewed at length with the patient today.    Labs/ tests ordered today include: BMET  Orders Placed This Encounter  Procedures  . Basic Metabolic Panel (BMET)  . AMB Referral to [facility] Management     Disposition:   FU with  2 weeks.   Signed, Warren Reining, NP  04/14/2015 3:57 PM    [facility] [address] [place], [place] [address] Phone: [phone]; Fax: [phone]

## 2015-05-15 ENCOUNTER — Ambulatory Visit (INDEPENDENT_AMBULATORY_CARE_PROVIDER_SITE_OTHER): Payer: Medicare Other | Admitting: Adult Health

## 2015-05-15 ENCOUNTER — Encounter: Payer: Self-pay | Admitting: Adult Health

## 2015-05-15 VITALS — BP 152/60 | HR 62 | Ht 74.0 in | Wt 208.0 lb

## 2015-05-15 DIAGNOSIS — I10 Essential (primary) hypertension: Secondary | ICD-10-CM | POA: Diagnosis not present

## 2015-05-15 DIAGNOSIS — I480 Paroxysmal atrial fibrillation: Secondary | ICD-10-CM

## 2015-05-15 DIAGNOSIS — Z79899 Other long term (current) drug therapy: Secondary | ICD-10-CM | POA: Diagnosis not present

## 2015-05-15 NOTE — Patient Instructions (Signed)
Your physician wants you to follow-up in: 6 Months with Joni Reining, NP. You will receive a reminder letter in the mail two months in advance. If you don't receive a letter, please call our office to schedule the follow-up appointment.  Your physician recommends that you continue on your current medications as directed. Please refer to the Current Medication list given to you today.  Your physician recommends that you return for lab work in: Fasting  If you need a refill on your cardiac medications before your next appointment, please call your pharmacy.  Thank you for choosing  [facility]!

## 2015-05-15 NOTE — Progress Notes (Signed)
Cardiology Office Note   Date:  05/15/2015   ID:  Warren Mcdonald, DOB August 22, 1929, MRN [id number]  PCP:  Fredirick Maudlin, MD  Cardiologist:  [place] Calix, NP   Chief Complaint  Patient presents with  . Hypertension  . Atrial Fibrillation      History of Present Illness: Warren Mcdonald is a 81 y.o. male who presents for ongoing assessment and management of atrial fib with RVR, CHADS VASC Score of 4.  chronic diastolic heart failure, The patient is on Eliquis with CHADS VASC Score of 5.He comes today without complaints. He is being followed by Dr. Juanetta Gosling for his diabetes. He offers no complaints of rapid HR, bleeding or fluid retention.     Past Medical History  Diagnosis Date  . Type 2 diabetes mellitus (HCC)   . Essential hypertension   . Hyperlipidemia   . PUD (peptic ulcer disease)   . Peripheral vascular disease (HCC) 2011    Left above-knee popliteal to posterior tibial artery bypass     Past Surgical History  Procedure Laterality Date  . Appendectomy    . Cataract extraction w/ intraocular lens  implant, bilateral    . Pr vein bypass graft,aorto-fem-pop      Left above knee popliteal to posterior tibial artery bypass graft  . Eye surgery    . Cholecystectomy N/A 09/08/2014    Procedure: CHOLECYSTECTOMY;  Surgeon: Franky Macho Md, MD;  Location: [facility];  Service: General;  Laterality: N/A;     Current Outpatient Prescriptions  Medication Sig Dispense Refill  . amLODipine (NORVASC) 10 MG tablet Take 1 tablet (10 mg total) by mouth daily. 180 tablet 3  . apixaban (ELIQUIS) 5 MG TABS tablet Take 1 tablet (5 mg total) by mouth 2 (two) times daily. 60 tablet 5  . metFORMIN (GLUCOPHAGE) 500 MG tablet Take 500 mg by mouth 2 (two) times daily with a meal.      . metoprolol tartrate (LOPRESSOR) 25 MG tablet TK 1 T PO BID  11  . pravastatin (PRAVACHOL) 40 MG tablet Take 40 mg by mouth daily.       No current facility-administered medications for this  visit.    Allergies:   Review of patient's allergies indicates no known allergies.    Social History:  The patient  reports that he quit smoking about 26 years ago. His smoking use included Cigars. He quit smokeless tobacco use about 26 years ago. His smokeless tobacco use included Chew. He reports that he does not drink alcohol or use illicit drugs.   Family History:  The patient's family history includes Cancer in his brother, daughter, and sister; Deep vein thrombosis in his sister; Diabetes in his daughter, other, and sister; Heart attack in his father and mother; Heart disease in his father and mother; Hypertension in his mother; Prostate cancer in his other.    ROS: All other systems are reviewed and negative. Unless otherwise mentioned in H&P    PHYSICAL EXAM: VS:  BP 152/60 mmHg  Pulse 62  Ht 6\' 2"  (1.88 m)  Wt 208 lb (94.348 kg)  BMI 26.69 kg/m2  SpO2 93% , BMI Body mass index is 26.69 kg/(m^2). GEN: Well nourished, well developed, in no acute distress HEENT: normal Neck: no JVD, carotid bruits, or masses Cardiac: RRR; no murmurs, rubs, or gallops,no edema  Respiratory:  Clear to auscultation bilaterally, normal work of breathing GI: soft, nontender, nondistended, + BS MS: no deformity or atrophy Non-pitting edema  Skin: warm  and dry, no rash Neuro:  Strength and sensation are intact Psych: euthymic mood, full affect    Recent Labs: 09/05/2014: TSH 1.682 09/09/2014: Magnesium 1.5 09/11/2014: ALT 33; BUN 20; Creatinine, Ser 1.35; Hemoglobin 8.2*; Platelets 331; Potassium 4.2; Sodium 138    Lipid Panel No results found for: CHOL, TRIG, HDL, CHOLHDL, VLDL, LDLCALC, LDLDIRECT    Wt Readings from Last 3 Encounters:  05/15/15 208 lb (94.348 kg)  04/14/15 206 lb (93.441 kg)  10/25/14 197 lb (89.359 kg)     ASSESSMENT AND PLAN:  1.Atrial fib: Doing well without bleeding issues. Heart rate is well controlled. Will continue BB and Eliquis. I have given him samples for  this and he is instructed to fill out the paperwork we gave him on last visit. I will check a CBC and send a copy to Dr. Juanetta Gosling.   2. Slightly elevated today: He will be continued on amlodipine and metoprolol. Will not add additional medications at this time. Will check BMET. May add low dose HCTZ. He is advised on low sodium diet.   3. Diabetes: He will be followed by Dr. Juanetta Gosling for this. Hgb A1C is ordered with labs to be sent to Dr. Juanetta Gosling.    Current medicines are reviewed at length with the patient today.    Labs/ tests ordered today include: BMET, CVC and HgA1c.   Orders Placed This Encounter  Procedures  . CBC with Differential  . Basic Metabolic Panel (BMET)  . HgB A1c     Disposition:   FU with 6 months.  Signed, Joni Reining, NP  05/15/2015 1:25 PM    [facility] [address], [place], [place] [address] Phone: [phone]; Fax: [phone]

## 2015-05-15 NOTE — Progress Notes (Deleted)
Name: Warren Mcdonald    DOB: 1930/02/08  Age: 81 y.o.  MR#: [id number]       PCP:  Fredirick Maudlin, MD      Insurance: Payor: [employer] copy[employer] MEDICARE / Plan: [facility] MEDICARE / Product Type: *No Product type* /   CC:   No chief complaint on file.   VS Filed Vitals:   05/15/15 1307  BP: 152/60  Pulse: 62  Height:  (1.88 m)  Weight: 208 lb (94.348 kg)  SpO2: 93%    Weights Current Weight  05/15/15 208 lb (94.348 kg)  04/14/15 206 lb (93.441 kg)  10/25/14 197 lb (89.359 kg)    Blood Pressure  BP Readings from Last 3 Encounters:  05/15/15 152/60  04/14/15 184/78  10/25/14 144/70     Admit date:  (Not on file) Last encounter with RMR:  04/14/2015   Allergy Review of patient's allergies indicates no known allergies.  Current Outpatient Prescriptions  Medication Sig Dispense Refill  . amLODipine (NORVASC) 10 MG tablet Take 1 tablet (10 mg total) by mouth daily. 180 tablet 3  . apixaban (ELIQUIS) 5 MG TABS tablet Take 1 tablet (5 mg total) by mouth 2 (two) times daily. 60 tablet 5  . metFORMIN (GLUCOPHAGE) 500 MG tablet Take 500 mg by mouth 2 (two) times daily with a meal.      . metoprolol tartrate (LOPRESSOR) 25 MG tablet TK 1 T PO BID  11  . pravastatin (PRAVACHOL) 40 MG tablet Take 40 mg by mouth daily.       No current facility-administered medications for this visit.    Discontinued Meds:    Medications Discontinued During This Encounter  Medication Reason  . metoprolol (LOPRESSOR) 100 MG tablet Dose change  . furosemide (LASIX) 20 MG tablet Error  . HYDROcodone-acetaminophen (NORCO/VICODIN) 5-325 MG per tablet Error  . potassium chloride (K-DUR) 10 MEQ tablet Error    Patient Active Problem List   Diagnosis Date Noted  . Acute diastolic heart failure (HCC) 09/12/2014  . Cholecystitis, acute 09/06/2014  . Atrial fibrillation with rapid ventricular response (HCC) 09/06/2014  . 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 Abdominal pain 09/02/2014  . DM (diabetes  mellitus) (HCC) 09/02/2014  . Pain in limb 12/27/2013  . Aftercare following surgery of the circulatory system, NEC 06/28/2013  . Atherosclerosis of native arteries of the extremities with intermittent claudication 06/28/2013  . Peripheral vascular disease (HCC) 12/28/2012  . PAD (peripheral artery disease) (HCC) 12/23/2011    LABS    Component Value Date/Time   NA 138 09/11/2014 0617   NA 138 09/10/2014 0433   NA 141 09/09/2014 0531   K 4.2 09/11/2014 0617   K 4.4 09/10/2014 0433   K 4.1 09/09/2014 0531   CL 110 09/11/2014 0617   CL 111 09/10/2014 0433   CL 114* 09/09/2014 0531   CO2 22 09/11/2014 0617   CO2 20 09/10/2014 0433   CO2 19 09/09/2014 0531   GLUCOSE 181* 09/11/2014 0617   GLUCOSE 162* 09/10/2014 0433   GLUCOSE 146* 09/09/2014 0531   BUN 20 09/11/2014 0617   BUN 20 09/10/2014 0433   BUN 16 09/09/2014 0531   CREATININE 1.35 09/11/2014 0617   CREATININE 1.40* 09/10/2014 0433   CREATININE 1.48* 09/09/2014 0531   CALCIUM 8.3* 09/11/2014 0617   CALCIUM 8.0* 09/10/2014 0433   CALCIUM 7.8* 09/09/2014 0531   GFRNONAA 47* 09/11/2014 0617   GFRNONAA 45* 09/10/2014 0433   GFRNONAA 42* 09/09/2014 0531   GFRAA 55*  09/11/2014 0617   GFRAA 52* 09/10/2014 0433   GFRAA 49* 09/09/2014 0531   CMP     Component Value Date/Time   NA 138 09/11/2014 0617   K 4.2 09/11/2014 0617   CL 110 09/11/2014 0617   CO2 22 09/11/2014 0617   GLUCOSE 181* 09/11/2014 0617   BUN 20 09/11/2014 0617   CREATININE 1.35 09/11/2014 0617   CALCIUM 8.3* 09/11/2014 0617   PROT 6.4 09/11/2014 0616   ALBUMIN 2.0* 09/11/2014 0616   AST 24 09/11/2014 0616   ALT 33 09/11/2014 0616   ALKPHOS 141* 09/11/2014 0616   BILITOT 0.8 09/11/2014 0616   GFRNONAA 47* 09/11/2014 0617   GFRAA 55* 09/11/2014 0617       Component Value Date/Time   WBC 8.8 09/11/2014 0617   WBC 8.5 09/10/2014 0433   WBC 7.1 09/09/2014 0531   HGB 8.2* 09/11/2014 0617   HGB 8.1* 09/10/2014 0433   HGB 8.2* 09/09/2014 0531    HCT 24.9* 09/11/2014 0617   HCT 24.9* 09/10/2014 0433   HCT 25.2* 09/09/2014 0531   MCV 91.5 09/11/2014 0617   MCV 91.9 09/10/2014 0433   MCV 91.3 09/09/2014 0531    Lipid Panel  No results found for: CHOL, TRIG, HDL, CHOLHDL, VLDL, LDLCALC, LDLDIRECT  ABG    Component Value Date/Time   TCO2 24 12/06/2009 0722     Lab Results  Component Value Date   TSH 1.682 09/05/2014   BNP (last 3 results) No results for input(s): BNP in the last 8760 hours.  ProBNP (last 3 results) No results for input(s): PROBNP in the last 8760 hours.  Cardiac Panel (last 3 results) No results for input(s): CKTOTAL, CKMB, TROPONINI, RELINDX in the last 72 hours.  Iron/TIBC/Ferritin/ %Sat No results found for: IRON, TIBC, FERRITIN, IRONPCTSAT   EKG Orders placed or performed during the hospital encounter of 09/02/14  . EKG 12-Lead  . EKG 12-Lead  . EKG 12-Lead  . EKG 12-Lead  . EKG 12-Lead  . EKG 12-Lead  . EKG 12-Lead  . EKG 12-Lead  . EKG 12-Lead  . EKG 12-Lead  . EKG 12-Lead  . EKG 12-Lead     Prior Assessment and Plan Problem List as of 05/15/2015      Cardiovascular and Mediastinum   PAD (peripheral artery disease) (HCC)   Peripheral vascular disease (HCC)   Atherosclerosis of native arteries of the extremities with intermittent claudication   Atrial fibrillation with rapid ventricular response (HCC)   Acute diastolic heart failure (HCC)     Digestive   Hepatic abscess   Cholecystitis, acute     Endocrine   DM (diabetes mellitus) (HCC)     Other   Aftercare following surgery of the circulatory system, NEC   Pain in limb   Abdominal pain       Imaging: No results found.

## 2015-06-26 ENCOUNTER — Encounter: Payer: Self-pay | Admitting: Family

## 2015-07-03 ENCOUNTER — Encounter (HOSPITAL_COMMUNITY): Payer: Medicare Other

## 2015-07-03 ENCOUNTER — Ambulatory Visit: Payer: Medicare Other | Admitting: Family

## 2015-07-10 ENCOUNTER — Telehealth: Payer: Self-pay | Admitting: *Deleted

## 2015-07-10 NOTE — Telephone Encounter (Signed)
Patient given 3 sample boxes of Eliquis 5 mg

## 2015-08-02 ENCOUNTER — Telehealth: Payer: Self-pay | Admitting: *Deleted

## 2015-08-02 NOTE — Telephone Encounter (Signed)
Patient has been approved for Eliquis free of charge from 08/01/15 to 05/26/16 through the [employer] pt assistance foundation.

## 2015-08-07 ENCOUNTER — Encounter: Payer: Self-pay | Admitting: Family

## 2015-08-10 ENCOUNTER — Ambulatory Visit (HOSPITAL_COMMUNITY)
Admission: RE | Admit: 2015-08-10 | Discharge: 2015-08-10 | Disposition: A | Discharge status: Home / Self Care | Payer: Medicare Other | Source: Ambulatory Visit | Attending: Family | Admitting: Family

## 2015-08-10 ENCOUNTER — Ambulatory Visit (INDEPENDENT_AMBULATORY_CARE_PROVIDER_SITE_OTHER)
Admission: RE | Admit: 2015-08-10 | Discharge: 2015-08-10 | Disposition: A | Discharge status: Home / Self Care | Payer: Medicare Other | Source: Ambulatory Visit | Attending: Family | Admitting: Family

## 2015-08-10 ENCOUNTER — Other Ambulatory Visit: Payer: Self-pay | Admitting: Family

## 2015-08-10 ENCOUNTER — Ambulatory Visit (INDEPENDENT_AMBULATORY_CARE_PROVIDER_SITE_OTHER): Payer: Medicare Other | Admitting: Family

## 2015-08-10 ENCOUNTER — Encounter: Payer: Self-pay | Admitting: Family

## 2015-08-10 VITALS — BP 158/78 | HR 60 | Temp 97.6°F | Resp 16 | Ht 73.0 in | Wt 215.0 lb

## 2015-08-10 DIAGNOSIS — E1151 Type 2 diabetes mellitus with diabetic peripheral angiopathy without gangrene: Secondary | ICD-10-CM

## 2015-08-10 DIAGNOSIS — Z95828 Presence of other vascular implants and grafts: Secondary | ICD-10-CM

## 2015-08-10 DIAGNOSIS — Z4889 Encounter for other specified surgical aftercare: Secondary | ICD-10-CM | POA: Diagnosis not present

## 2015-08-10 DIAGNOSIS — I10 Essential (primary) hypertension: Secondary | ICD-10-CM | POA: Insufficient documentation

## 2015-08-10 DIAGNOSIS — Z48812 Encounter for surgical aftercare following surgery on the circulatory system: Secondary | ICD-10-CM

## 2015-08-10 DIAGNOSIS — I739 Peripheral vascular disease, unspecified: Secondary | ICD-10-CM

## 2015-08-10 DIAGNOSIS — E119 Type 2 diabetes mellitus without complications: Secondary | ICD-10-CM | POA: Insufficient documentation

## 2015-08-10 NOTE — Patient Instructions (Signed)
Peripheral Vascular Disease Peripheral vascular disease (PVD) is a disease of the blood vessels that are not part of your heart and brain. A simple term for PVD is poor circulation. In most cases, PVD narrows the blood vessels that carry blood from your heart to the rest of your body. This can result in a decreased supply of blood to your arms, legs, and internal organs, like your stomach or kidneys. However, it most often affects a person's lower legs and feet. There are two types of PVD.  Organic PVD. This is the more common type. It is caused by damage to the structure of blood vessels.  Functional PVD. This is caused by conditions that make blood vessels contract and tighten (spasm). Without treatment, PVD tends to get worse over time. PVD can also lead to acute ischemic limb. This is when an arm or limb suddenly has trouble getting enough blood. This is a medical emergency. CAUSES Each type of PVD has many different causes. The most common cause of PVD is buildup of a fatty material (plaque) inside of your arteries (atherosclerosis). Small amounts of plaque can break off from the walls of the blood vessels and become lodged in a smaller artery. This blocks blood flow and can cause acute ischemic limb. Other common causes of PVD include:  Blood clots that form inside of blood vessels.  Injuries to blood vessels.  Diseases that cause inflammation of blood vessels or cause blood vessel spasms.  Health behaviors and health history that increase your risk of developing PVD. RISK FACTORS  You may have a greater risk of PVD if you:  Have a family history of PVD.  Have certain medical conditions, including:  High cholesterol.  Diabetes.  High blood pressure (hypertension).  Coronary heart disease.  Past problems with blood clots.  Past injury, such as burns or a broken bone. These may have damaged blood vessels in your limbs.  Buerger disease. This is caused by inflamed blood  vessels in your hands and feet.  Some forms of arthritis.  Rare birth defects that affect the arteries in your legs.  Use tobacco.  Do not get enough exercise.  Are obese.  Are age 50 or older. SIGNS AND SYMPTOMS  PVD may cause many different symptoms. Your symptoms depend on what part of your body is not getting enough blood. Some common signs and symptoms include:  Cramps in your lower legs. This may be a symptom of poor leg circulation (claudication).  Pain and weakness in your legs while you are physically active that goes away when you rest (intermittent claudication).  Leg pain when at rest.  Leg numbness, tingling, or weakness.  Coldness in a leg or foot, especially when compared with the other leg.  Skin or hair changes. These can include:  Hair loss.  Shiny skin.  Pale or bluish skin.  Thick toenails.  Inability to get or maintain an erection (erectile dysfunction). People with PVD are more prone to developing ulcers and sores on their toes, feet, or legs. These may take longer than normal to heal. DIAGNOSIS Your health care provider may diagnose PVD from your signs and symptoms. The health care provider will also do a physical exam. You may have tests to find out what is causing your PVD and determine its severity. Tests may include:  Blood pressure recordings from your arms and legs and measurements of the strength of your pulses (pulse volume recordings).  Imaging studies using sound waves to take pictures of   the blood flow through your blood vessels (Doppler ultrasound).  Injecting a dye into your blood vessels before having imaging studies using:  X-rays (angiogram or arteriogram).  Computer-generated X-rays (CT angiogram).  A powerful electromagnetic field and a computer (magnetic resonance angiogram or MRA). TREATMENT Treatment for PVD depends on the cause of your condition and the severity of your symptoms. It also depends on your age. Underlying  causes need to be treated and controlled. These include long-lasting (chronic) conditions, such as diabetes, high cholesterol, and high blood pressure. You may need to first try making lifestyle changes and taking medicines. Surgery may be needed if these do not work. Lifestyle changes may include:  Quitting smoking.  Exercising regularly.  Following a low-fat, low-cholesterol diet. Medicines may include:  Blood thinners to prevent blood clots.  Medicines to improve blood flow.  Medicines to improve your blood cholesterol levels. Surgical procedures may include:  A procedure that uses an inflated balloon to open a blocked artery and improve blood flow (angioplasty).  A procedure to put in a tube (stent) to keep a blocked artery open (stent implant).  Surgery to reroute blood flow around a blocked artery (peripheral bypass surgery).  Surgery to remove dead tissue from an infected wound on the affected limb.  Amputation. This is surgical removal of the affected limb. This may be necessary in cases of acute ischemic limb that are not improved through medical or surgical treatments. HOME CARE INSTRUCTIONS  Take medicines only as directed by your health care provider.  Do not use any tobacco products, including cigarettes, chewing tobacco, or electronic cigarettes. If you need help quitting, ask your health care provider.  Lose weight if you are overweight, and maintain a healthy weight as directed by your health care provider.  Eat a diet that is low in fat and cholesterol. If you need help, ask your health care provider.  Exercise regularly. Ask your health care provider to suggest some good activities for you.  Use compression stockings or other mechanical devices as directed by your health care provider.  Take good care of your feet.  Wear comfortable shoes that fit well.  Check your feet often for any cuts or sores. SEEK MEDICAL CARE IF:  You have cramps in your legs  while walking.  You have leg pain when you are at rest.  You have coldness in a leg or foot.  Your skin changes.  You have erectile dysfunction.  You have cuts or sores on your feet that are not healing. SEEK IMMEDIATE MEDICAL CARE IF:  Your arm or leg turns cold and blue.  Your arms or legs become red, warm, swollen, painful, or numb.  You have chest pain or trouble breathing.  You suddenly have weakness in your face, arm, or leg.  You become very confused or lose the ability to speak.  You suddenly have a very bad headache or lose your vision.   This information is not intended to replace advice given to you by your health care provider. Make sure you discuss any questions you have with your health care provider.   Document Released: 06/20/2004 Document Revised: 06/03/2014 Document Reviewed: 10/21/2013 Elsevier Interactive Patient Education 2016 [employer] Inc.  

## 2015-08-10 NOTE — Progress Notes (Signed)
[facility] HISTORY AND PHYSICAL -PAD  History of Present Illness Warren Mcdonald is a 82 y.o. male patient of Dr. Myra Gianotti who is status post left above-knee popliteal to posterior tibial artery bypass graft in 2011. In 2010 he underwent angioplasty and recanalization of the right occluded tibioperoneal trunk and posterior tibial artery.  In 2014 he had elevated velocities within the right tibioperoneal trunk. Maximum velocity was 410 cm/s.   He returns today for re-evaluation of bilateral LE arterial perfusion.  It is difficult to ascertain, but it seems like he does not have claudication symptoms in either leg, but does have numbness in left anterior thigh that radiates to left foot, worse with standing for long periods, not necessarily worse with walking.  He states that he used to walk 3x around a school track and will resume, denies problems with falling. He states he received his rolling walker and appreciates it's use. Pt denies non-healing wounds.  Pt denies any history of stroke or TIA.   He had a cholecystectomy in 2016, doing well.  Pt Diabetic: Yes, seems in good control  Pt smoker: non-smoker   Pt meds include:  Statin :Yes  ASA: Yes  Other anticoagulants/antiplatelets: Eliquis, he has paroxysmal atrial fib   Past Medical History  Diagnosis Date  . Type 2 diabetes mellitus (HCC)   . Essential hypertension   . Hyperlipidemia   . PUD (peptic ulcer disease)   . Peripheral vascular disease (HCC) 2011    Left above-knee popliteal to posterior tibial artery bypass     Social History Social History  Substance Use Topics  . Smoking status: Former Smoker    Types: Cigars    Quit date: 05/27/1988  . Smokeless tobacco: Former Neurosurgeon    Types: Chew    Quit date: 05/27/1988  . Alcohol Use: No    Family History Family History  Problem Relation Age of Onset  . Heart disease Mother   . Hypertension Mother   . Heart attack Mother    . Heart attack Father   . Heart disease Father     Heart disease before age 23  . Diabetes Other   . Prostate cancer Other   . Cancer Sister   . Deep vein thrombosis Sister   . Diabetes Sister     Amputation-right leg  . Cancer Daughter   . Diabetes Daughter   . Cancer Brother     Past Surgical History  Procedure Laterality Date  . Appendectomy    . Cataract extraction w/ intraocular lens  implant, bilateral    . Pr vein bypass graft,aorto-fem-pop      Left above knee popliteal to posterior tibial artery bypass graft  . Eye surgery    . Cholecystectomy N/A 09/08/2014    Procedure: CHOLECYSTECTOMY;  Surgeon: Franky Macho Md, MD;  Location: [facility];  Service: General;  Laterality: N/A;    No Known Allergies  Current Outpatient Prescriptions  Medication Sig Dispense Refill  . amLODipine (NORVASC) 10 MG tablet Take 1 tablet (10 mg total) by mouth daily. 180 tablet 3  . apixaban (ELIQUIS) 5 MG TABS tablet Take 1 tablet (5 mg total) by mouth 2 (two) times daily. 60 tablet 5  . metFORMIN (GLUCOPHAGE) 500 MG tablet Take 500 mg by mouth 2 (two) times daily with a meal.      . metoprolol tartrate (LOPRESSOR) 25 MG tablet TK 1 T PO BID  11  . pravastatin (PRAVACHOL) 40 MG tablet Take  40 mg by mouth daily.       No current facility-administered medications for this visit.    ROS: See HPI for pertinent positives and negatives.   Physical Examination  Filed Vitals:   08/10/15 1448 08/10/15 1453  BP: 159/77 158/78  Pulse: 59 60  Temp: 97.6 F (36.4 C)   TempSrc: Oral   Resp: 16   Height: 6\' 1"  (1.854 m)   Weight: 215 lb (97.523 kg)   SpO2: 99%    Body mass index is 28.37 kg/(m^2).   General: A&O x 3, WDWN,  Gait: normal  Eyes: PERRLA,  Pulmonary: CTAB, without wheezes , rales or rhonchi  Cardiac: regular rhythm, no detected murmur   Carotid Bruits  Left  Right    Negative  Negative   Aorta is not palpable Radial pulses: 2+ and =   VASCULAR  EXAM:  Extremities without ischemic changes  without Gangrene; without open wounds.   LE Pulses  LEFT  RIGHT   FEMORAL  palpable  palpable   POPLITEAL  palpable  palpable   POSTERIOR TIBIAL  palpable  Not palpable   DORSALIS PEDIS  ANTERIOR TIBIAL  palpable  not palpable    Abdomen: soft, NT, no palpable masses.  Skin: no rashes, no ulcers.  Musculoskeletal: no muscle wasting or atrophy.  Neurologic: A&O X 3; Appropriate Affect, MOTOR FUNCTION: moving all extremities equally, motor strength 5/5 throughout. Speech is fluent/normal. CN 2-12 intact.           Non-Invasive Vascular Imaging: DATE: 08/10/2015 ABI: RIGHT: 0.75 (0.88, 06/27/14), Waveforms: biphasic; TBI: 0.53  LEFT: 1.13 (1.07), Waveforms: biphasic, TBI: 0.73  Left LE Arterial Duplex: No internal vessel narrowing within the bypass graft or anastomosis. Elevated velocity in the distal posterior tibial artery which appears mostly due to a change in vessel diameter. Velocity change from 50 cm/s to 266 cm/s in the right tibioperoneal trunk with biphasic waveforms, greater than 50% right tibioperoneal trunk stenosis. No significant change from exam of 06/27/14.   ASSESSMENT: [name] is a 82 y.o. male who is status post left above-knee popliteal to posterior tibial artery bypass graft in 2011. In 2010 he underwent angioplasty and recanalization of the right occluded tibioperoneal trunk and posterior tibial artery.  In 2014 he had elevated velocities within the right tibioperoneal trunk. Maximum velocity was 410 cm/s.  He has no signs of ischemia in his feet/legs, does not seem to have claudication symptoms. Today's left LE arterial Duplex suggests no internal vessel narrowing within the bypass graft or anastomosis. Elevated velocity in the distal posterior tibial artery which appears mostly due to a change in vessel diameter. Velocity change from 50 cm/s to 266 cm/s in the right  tibioperoneal trunk with biphasic waveforms, greater than 50% right tibioperoneal trunk stenosis. No significant change from exam of 06/27/14.  ABI in the right decreased from 88% to 75% and left ABI remains stable at 100%.    PLAN:  Graduated walking program and/or daily seated leg exercises discussed in detail.    Based on the patient's vascular studies and examination, pt will return to clinic in 1 year with ABI's and left LE arterial Duplex.  I discussed in depth with the patient the nature of atherosclerosis, and emphasized the importance of maximal medical management including strict control of blood pressure, blood glucose, and lipid levels, obtaining regular exercise, and continued cessation of smoking.  The patient is aware that without maximal medical management the underlying atherosclerotic disease process will  progress, limiting the benefit of any interventions.  The patient was given information about PAD including signs, symptoms, treatment, what symptoms should prompt the patient to seek immediate medical care, and risk reduction measures to take.  Charisse March, RN, MSN, FNP-C [facility] Phone: [phone]  Clinic MD: Edilia Bo  08/10/2015 2:04 PM

## 2015-08-11 NOTE — Addendum Note (Signed)
Addended by: Melodye Ped C on: 08/11/2015 11:43 AM   Modules accepted: Orders

## 2015-08-25 ENCOUNTER — Other Ambulatory Visit: Payer: Self-pay | Admitting: Adult Health

## 2015-08-25 NOTE — Telephone Encounter (Signed)
1. Which medications need to be refilled? (please list name of each medication and dose if known)  Eliquis   2. Which pharmacy/location (including street and city if local pharmacy) is medication to be sent to? [facility] [place]   Pt is wanting to know if he can get more samples, pt can be reached at [phone]

## 2015-08-25 NOTE — Telephone Encounter (Signed)
Refilled denied,pt gets free eliquis thru [employer] squibb program thru 05/26/16 I have samples for pt as he awaits delivery to our office of med(pt never answered theie phone to set up shipping to his home,we have now set it up to ship to our office) Patients phone line is busy,will keep attempting to reach him i reached a Ms carolyn listed as approved contact and she was going to to tell patient to stop by here.She states his home phone is not working and she cals him on a cell number but she doesn't remembe it.

## 2015-08-30 ENCOUNTER — Telehealth: Payer: Self-pay | Admitting: *Deleted

## 2015-08-30 NOTE — Telephone Encounter (Signed)
Patient called and notified that patient assistance Eliquis is in office to be picked up. Patient voiced understanding.

## 2015-12-05 ENCOUNTER — Telehealth: Payer: Self-pay

## 2015-12-05 NOTE — Telephone Encounter (Signed)
Pt's line bust multiple calls, lm with daughter,pts's 90 day supply eliquis here at office waiting to be picked up

## 2016-02-14 ENCOUNTER — Encounter: Payer: Self-pay | Admitting: *Deleted

## 2016-04-16 ENCOUNTER — Other Ambulatory Visit: Payer: Self-pay | Admitting: Adult Health

## 2016-05-08 ENCOUNTER — Encounter: Payer: Self-pay | Admitting: *Deleted

## 2016-05-23 ENCOUNTER — Other Ambulatory Visit: Payer: Self-pay | Admitting: Adult Health

## 2016-07-02 ENCOUNTER — Encounter: Payer: Self-pay | Admitting: Cardiology

## 2016-07-12 ENCOUNTER — Other Ambulatory Visit: Payer: Self-pay | Admitting: Adult Health

## 2016-08-02 ENCOUNTER — Encounter: Payer: Self-pay | Admitting: Family

## 2016-08-12 ENCOUNTER — Ambulatory Visit (HOSPITAL_COMMUNITY): Admission: RE | Admit: 2016-08-12 | Payer: Medicare Other | Source: Ambulatory Visit

## 2016-08-12 ENCOUNTER — Ambulatory Visit: Payer: Medicare Other | Admitting: Family

## 2016-08-18 ENCOUNTER — Other Ambulatory Visit: Payer: Self-pay | Admitting: Adult Health

## 2016-09-03 ENCOUNTER — Encounter: Payer: Self-pay | Admitting: Cardiology

## 2016-09-03 ENCOUNTER — Ambulatory Visit (INDEPENDENT_AMBULATORY_CARE_PROVIDER_SITE_OTHER): Payer: Medicare Other | Admitting: Cardiology

## 2016-09-03 VITALS — BP 144/78 | HR 73 | Ht 73.0 in | Wt 217.0 lb

## 2016-09-03 DIAGNOSIS — I10 Essential (primary) hypertension: Secondary | ICD-10-CM

## 2016-09-03 DIAGNOSIS — E782 Mixed hyperlipidemia: Secondary | ICD-10-CM | POA: Diagnosis not present

## 2016-09-03 DIAGNOSIS — I5032 Chronic diastolic (congestive) heart failure: Secondary | ICD-10-CM | POA: Diagnosis not present

## 2016-09-03 DIAGNOSIS — I4891 Unspecified atrial fibrillation: Secondary | ICD-10-CM | POA: Diagnosis not present

## 2016-09-03 NOTE — Patient Instructions (Signed)
Medication Instructions:  START TAKING CLARITIN & FLONASE ( BOTH OF THESE ARE OVER THE COUNTER MEDICATIONS)   Labwork: NONE  Testing/Procedures: NONE  Follow-Up: Your physician wants you to follow-up in: 6 MONTHS .  You will receive a reminder letter in the mail two months in advance. If you don't receive a letter, please call our office to schedule the follow-up appointment.   Any Other Special Instructions Will Be Listed Below (If Applicable).     If you need a refill on your cardiac medications before your next appointment, please call your pharmacy.

## 2016-09-03 NOTE — Progress Notes (Signed)
Clinical Summary Mr. Swor is a 83 y.o.male seen today for follow up of the following medical problems.   1. PAF - CHADS2Vasc score is 5. He is on anticoag.  - No bleeding troubles on eliquis.  - no recent palpitations - compliant with meds.   2. Chronic diastoilc HF - no recent LE edema - no SOB/DOE  3. HTN - he is compliant with meds -  4. PAD - followed by vascular  5. DM2 - followed by pcp  6. Hyperlipidemia - compliant with statin  Past Medical History:  Diagnosis Date  . Essential hypertension   . Hyperlipidemia   . Peripheral vascular disease ([place]) 2011   Left above-knee popliteal to posterior tibial artery bypass   . PUD (peptic ulcer disease)   . Type 2 diabetes mellitus (HCC)      No Known Allergies   Current Outpatient Prescriptions  Medication Sig Dispense Refill  . amLODipine (NORVASC) 10 MG tablet TAKE 1 TABLET BY MOUTH ONCE DAILY. MUST MAKE APPOINTMENT FOR FUTURE REFILLS 30 tablet 1  . apixaban (ELIQUIS) 5 MG TABS tablet Take 1 tablet (5 mg total) by mouth 2 (two) times daily. 60 tablet 5  . metFORMIN (GLUCOPHAGE) 500 MG tablet Take 500 mg by mouth 2 (two) times daily with a meal.      . metoprolol tartrate (LOPRESSOR) 25 MG tablet TK 1 T PO BID  11  . pravastatin (PRAVACHOL) 40 MG tablet Take 40 mg by mouth daily.       No current facility-administered medications for this visit.      Past Surgical History:  Procedure Laterality Date  . APPENDECTOMY    . CATARACT EXTRACTION W/ INTRAOCULAR LENS  IMPLANT, BILATERAL    . CHOLECYSTECTOMY N/A 09/08/2014   Procedure: CHOLECYSTECTOMY;  Surgeon: Aviva Signs Md, MD;  Location: [facility];  Service: General;  Laterality: N/A;  . EYE SURGERY    . PR VEIN BYPASS GRAFT,AORTO-FEM-POP     Left above knee popliteal to posterior tibial artery bypass graft     No Known Allergies    Family History  Problem Relation Age of Onset  . Heart disease Mother   . Hypertension Mother   . Heart attack  Mother   . Heart attack Father   . Heart disease Father     Heart disease before age 53  . Diabetes Other   . Prostate cancer Other   . Cancer Sister   . Deep vein thrombosis Sister   . Diabetes Sister     Amputation-right leg  . Cancer Daughter   . Diabetes Daughter   . Cancer Brother      Social History Mr. Metallo reports that he quit smoking about 28 years ago. His smoking use included Cigars. He quit smokeless tobacco use about 28 years ago. His smokeless tobacco use included Chew. Mr. Laduca reports that he does not drink alcohol.   Review of Systems CONSTITUTIONAL: No weight loss, fever, chills, weakness or fatigue.  HEENT: Eyes: No visual loss, blurred vision, double vision or yellow sclerae.No hearing loss, sneezing, congestion, runny nose or sore throat.  SKIN: No rash or itching.  CARDIOVASCULAR: per hpi RESPIRATORY: No shortness of breath, cough or sputum.  GASTROINTESTINAL: No anorexia, nausea, vomiting or diarrhea. No abdominal pain or blood.  GENITOURINARY: No burning on urination, no polyuria NEUROLOGICAL: No headache, dizziness, syncope, paralysis, ataxia, numbness or tingling in the extremities. No change in bowel or bladder control.  MUSCULOSKELETAL: No muscle,  back pain, joint pain or stiffness.  LYMPHATICS: No enlarged nodes. No history of splenectomy.  PSYCHIATRIC: No history of depression or anxiety.  ENDOCRINOLOGIC: No reports of sweating, cold or heat intolerance. No polyuria or polydipsia.  Marland Kitchen   Physical Examination Vitals:   09/03/16 1133  BP: (!) 144/78  Pulse: 73   Vitals:   09/03/16 1133  Weight: 217 lb (98.4 kg)  Height: 6' 1"  (1.854 m)    Gen: resting comfortably, no acute distress HEENT: no scleral icterus, pupils equal round and reactive, no palptable cervical adenopathy,  CV: RRR, no m/r/g, no jvd Resp: Clear to auscultation bilaterally GI: abdomen is soft, non-tender, non-distended, normal bowel sounds, no  hepatosplenomegaly MSK: extremities are warm, no edema.  Skin: warm, no rash Neuro:  no focal deficits Psych: appropriate affect   Diagnostic Studies 08/2014 echo Study Conclusions  - Left ventricle: The cavity size was normal. Wall thickness was increased in a pattern of mild LVH. Systolic function was normal. The estimated ejection fraction was in the range of 55% to 60%. Wall motion was normal; there were no regional wall motion abnormalities. Findings consistent with left ventricular diastolic dysfunction, ungraded in the setting of atrial fibrillation. - Aortic valve: Trileaflet; mildly thickened, mildly calcified leaflets. There was trivial regurgitation. - Mitral valve: Calcified annulus. Loose portion of mitral chordal structure (possible from anteromedial papillary) noted. There was mild regurgitation. - Left atrium: The atrium was mildly dilated. - Right atrium: The atrium was mildly dilated. - Tricuspid valve: There was moderate regurgitation. - Pulmonary arteries: PA peak pressure: 41 mm Hg (S). - Pericardium, extracardiac: There was no pericardial effusion.  Impressions:  - Mild LVH with LVEF 55-60%, features consistent with diastolic dysfunction. Mild biatrial enlargement. 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 Sclerotic aortic valve with trivial regurgitation. Moderate tricuspid regurgitation with mildly increased PASP 41 mmHg.    Assessment and Plan  1. PAF - no current symptoms. EKG in clinic today shows SR.  - continue current meds  2. CHronic diastolic HF - appears euvolemic, no significant symptoms - he will conitnue current meds  3. HTN - reasonable control given his age. Would consider ACE-I for him given his DM2, history, request records and labs from pcp to see if previously on  4. Hyperlipidemia - request pcp labs - conitnue statin.    F/u 6 months   Arnoldo Lenis, M.D.

## 2016-09-18 IMAGING — DX DG CHEST 2V
2 series · 2 of 2 positions shown · non-contrast
Comparison: 09/02/2014 images of the lung bases

CLINICAL DATA: Abdominal pain, liver abscess

EXAM:
CHEST  2 VIEW

[chest lat]
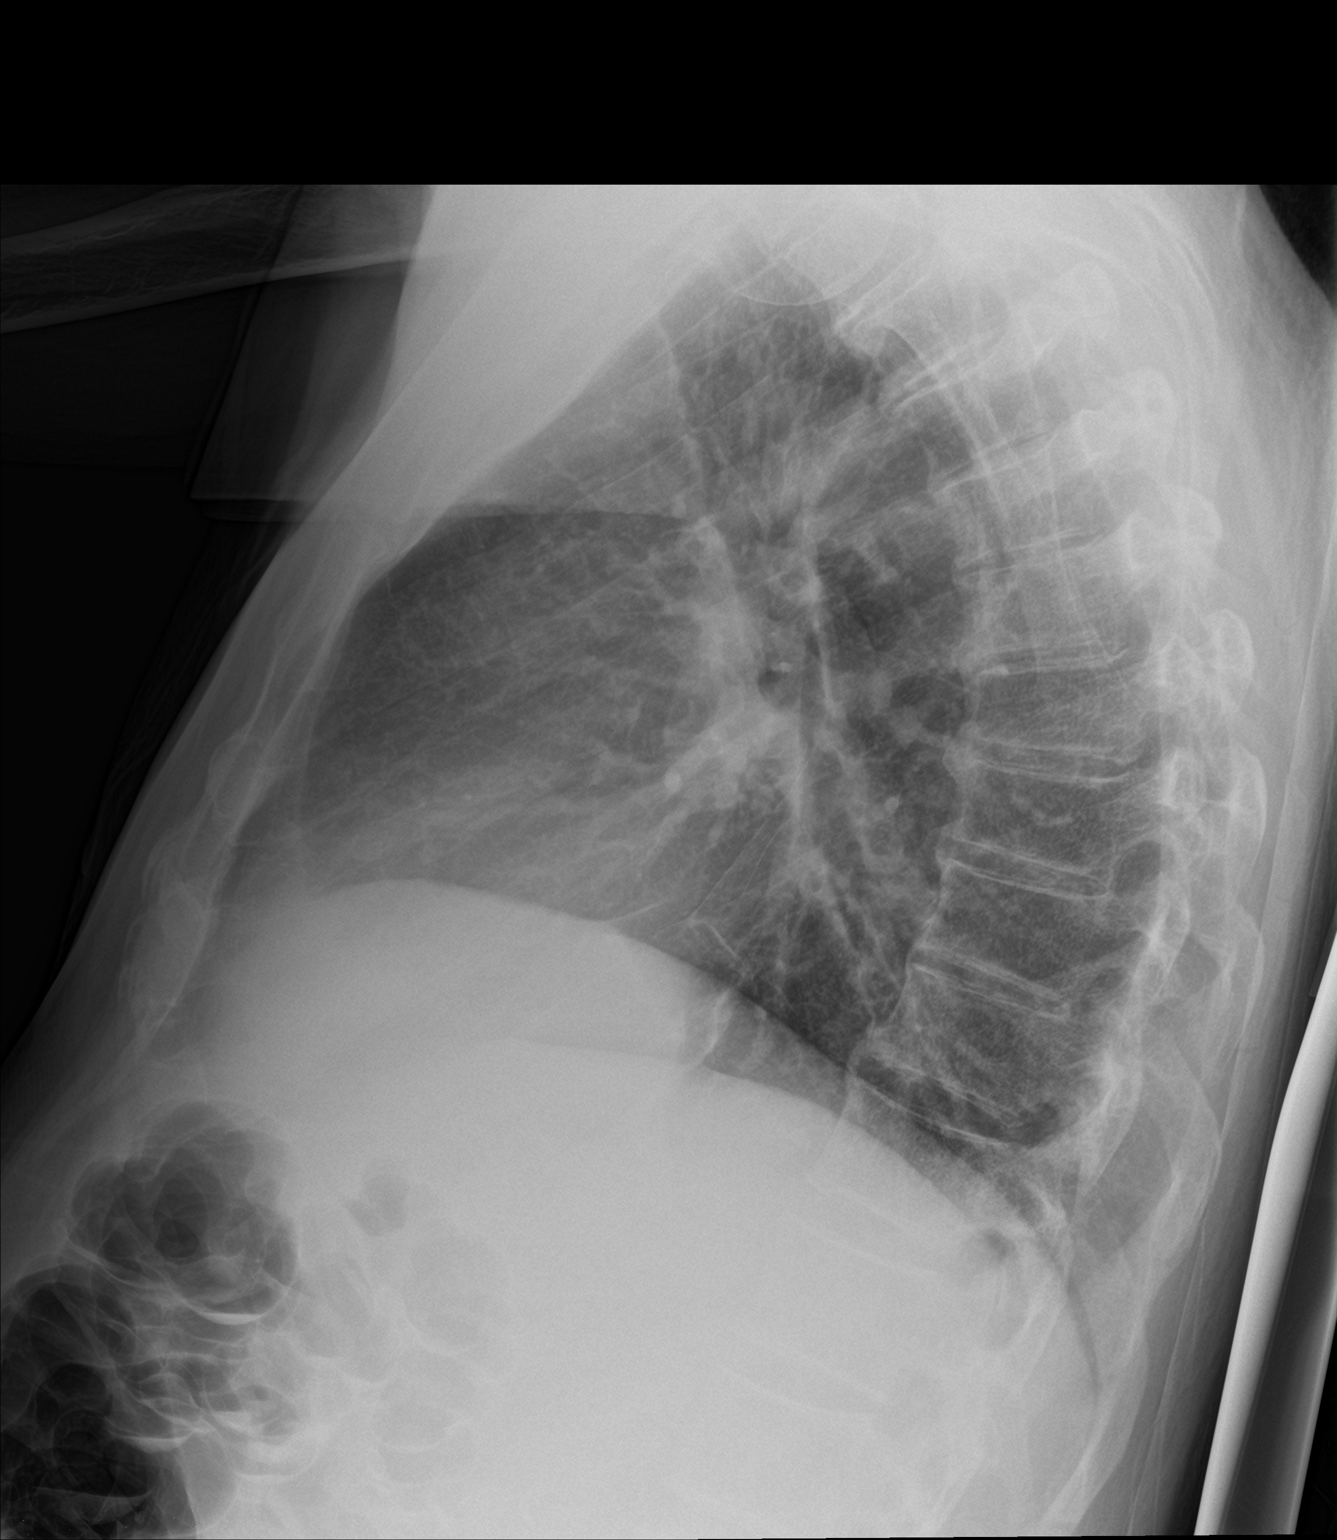

[chest ap]
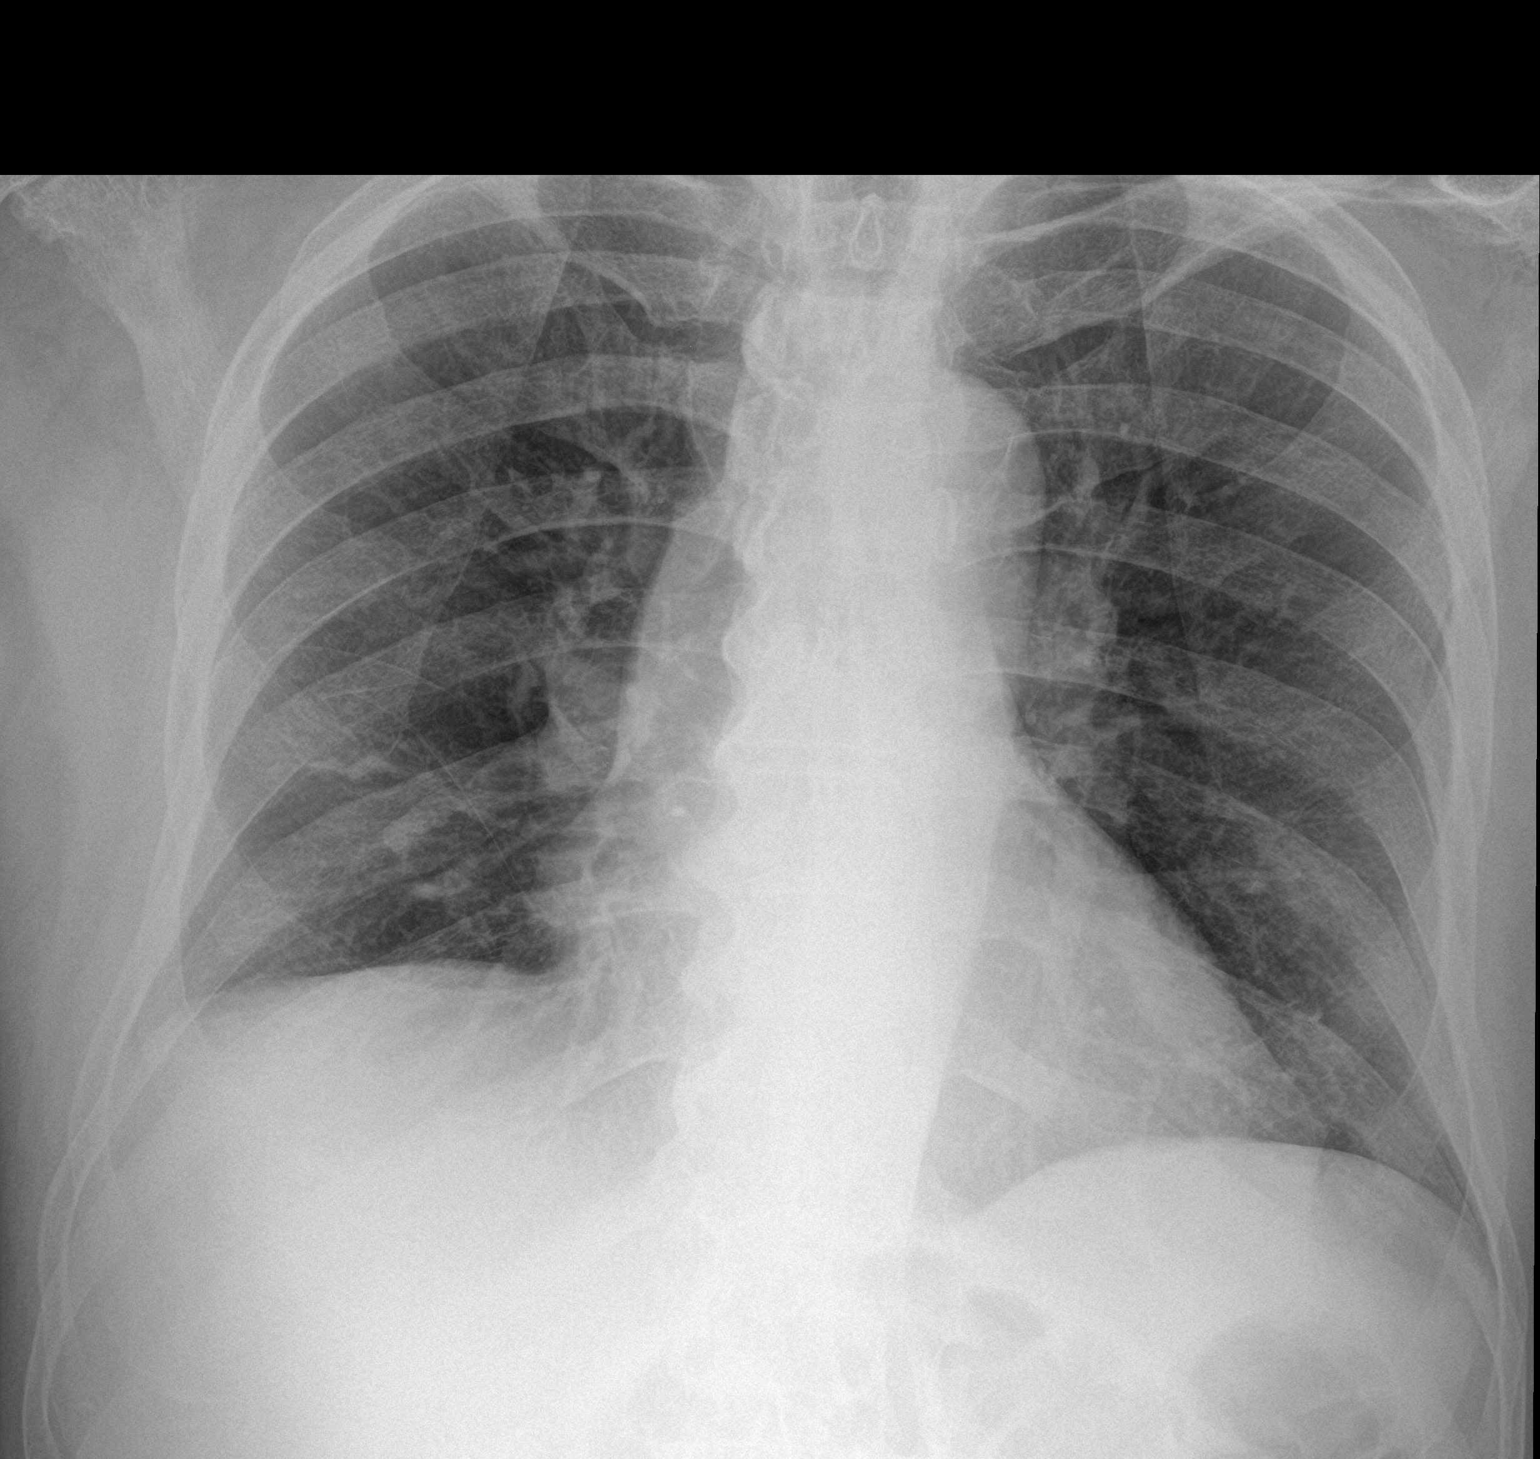

[2 of 2 positions shown; findings below may reference images not displayed]

FINDINGS: Cardiomediastinal silhouette is stable. No acute infiltrate or
pleural effusion. No pulmonary edema. Mild degenerative changes
thoracic spine.
IMPRESSION: No active cardiopulmonary disease.

## 2016-10-22 LAB — HEPATIC FUNCTION PANEL
ALT: 6 — AB (ref 10–40)
AST: 13 — AB (ref 14–40)
Alkaline Phosphatase: 96 (ref 25–125)

## 2016-10-23 ENCOUNTER — Other Ambulatory Visit: Payer: Self-pay

## 2016-10-23 LAB — LIPID PANEL
Cholesterol: 133 (ref 0–200)
HDL: 44 (ref 35–70)
LDL Cholesterol: 69
Triglycerides: 100 (ref 40–160)

## 2016-10-23 MED ORDER — AMLODIPINE BESYLATE 10 MG PO TABS: ORAL_TABLET | ORAL | 6 refills | Status: DC

## 2016-10-30 ENCOUNTER — Encounter: Payer: Self-pay | Admitting: Family

## 2016-11-04 ENCOUNTER — Encounter: Payer: Self-pay | Admitting: Family

## 2016-11-04 ENCOUNTER — Ambulatory Visit (INDEPENDENT_AMBULATORY_CARE_PROVIDER_SITE_OTHER): Payer: Medicare Other | Admitting: Family

## 2016-11-04 ENCOUNTER — Ambulatory Visit (HOSPITAL_COMMUNITY)
Admission: RE | Admit: 2016-11-04 | Discharge: 2016-11-04 | Disposition: A | Discharge status: Home / Self Care | Payer: Medicare Other | Source: Ambulatory Visit | Attending: Family | Admitting: Family

## 2016-11-04 ENCOUNTER — Ambulatory Visit (INDEPENDENT_AMBULATORY_CARE_PROVIDER_SITE_OTHER)
Admission: RE | Admit: 2016-11-04 | Discharge: 2016-11-04 | Disposition: A | Discharge status: Home / Self Care | Payer: Medicare Other | Source: Ambulatory Visit | Attending: Family | Admitting: Family

## 2016-11-04 VITALS — BP 191/76 | HR 55 | Temp 97.1°F | Resp 18 | Ht 73.0 in | Wt 212.0 lb

## 2016-11-04 DIAGNOSIS — E1151 Type 2 diabetes mellitus with diabetic peripheral angiopathy without gangrene: Secondary | ICD-10-CM

## 2016-11-04 DIAGNOSIS — Z4889 Encounter for other specified surgical aftercare: Secondary | ICD-10-CM | POA: Diagnosis not present

## 2016-11-04 DIAGNOSIS — I739 Peripheral vascular disease, unspecified: Secondary | ICD-10-CM | POA: Diagnosis not present

## 2016-11-04 DIAGNOSIS — Z48812 Encounter for surgical aftercare following surgery on the circulatory system: Secondary | ICD-10-CM

## 2016-11-04 DIAGNOSIS — Z95828 Presence of other vascular implants and grafts: Secondary | ICD-10-CM | POA: Diagnosis not present

## 2016-11-04 NOTE — Progress Notes (Signed)
[facility] [place]   CC: Follow up peripheral artery occlusive disease  History of Present Illness Warren Mcdonald is a 83 y.o. male patient of Dr. Myra Gianotti who is status post left above-knee popliteal to posterior tibial artery bypass graft in 2011. In 2010 he underwent angioplasty and recanalization of the right occluded tibioperoneal trunk and posterior tibial artery.  In 2014 he had elevated velocities within the right tibioperoneal trunk. Maximum velocity was 410 cm/s.   He returns today for re-evaluation of bilateral LE arterial perfusion.  It is difficult to ascertain, but it seems like he does not have claudication symptoms in either leg, but does have numbness in left anterior thigh that radiates to left foot, worse with standing for long periods, not necessarily worse with walking. He states he walked a mile with his walker recently.  He denies problems with falling.  He states he received his rolling walker with a seat and appreciates it's use. Pt denies non-healing wounds.  Pt denies any history of stroke or TIA.   He had a cholecystectomy in 2016.   Pt Diabetic: Yes, seems in good control  Pt smoker: non-smoker   Pt meds include:  Statin :Yes  ASA: Yes  Other anticoagulants/antiplatelets: Eliquis, he has paroxysmal atrial fib   Past Medical History:  Diagnosis Date  . Essential hypertension   . Hyperlipidemia   . Peripheral vascular disease (HCC) 2011   Left above-knee popliteal to posterior tibial artery bypass   . PUD (peptic ulcer disease)   . Type 2 diabetes mellitus (HCC)     Social History Social History  Substance Use Topics  . Smoking status: Former Smoker    Types: Cigars    Quit date: 05/27/1988  . Smokeless tobacco: Former Neurosurgeon    Types: Chew    Quit date: 05/27/1988  . Alcohol use No    Family History Family History  Problem Relation Age of Onset  . Heart disease Mother   . Hypertension Mother   . Heart  attack Mother   . Heart attack Father   . Heart disease Father        Heart disease before age 68  . Cancer Sister   . Deep vein thrombosis Sister   . Diabetes Sister        Amputation-right leg  . Cancer Daughter   . Diabetes Daughter   . Cancer Brother   . Diabetes Other   . Prostate cancer Other     Past Surgical History:  Procedure Laterality Date  . APPENDECTOMY    . CATARACT EXTRACTION W/ INTRAOCULAR LENS  IMPLANT, BILATERAL    . CHOLECYSTECTOMY N/A 09/08/2014   Procedure: CHOLECYSTECTOMY;  Surgeon: Franky Macho Md, MD;  Location: [facility];  Service: General;  Laterality: N/A;  . EYE SURGERY    . PR VEIN BYPASS GRAFT,AORTO-FEM-POP     Left above knee popliteal to posterior tibial artery bypass graft    No Known Allergies  Current Outpatient Prescriptions  Medication Sig Dispense Refill  . amLODipine (NORVASC) 10 MG tablet TAKE 1 TABLET BY MOUTH ONCE DAILY. 30 tablet 6  . apixaban (ELIQUIS) 5 MG TABS tablet Take 1 tablet (5 mg total) by mouth 2 (two) times daily. 60 tablet 5  . metFORMIN (GLUCOPHAGE) 500 MG tablet Take 500 mg by mouth 2 (two) times daily with a meal.      . metoprolol tartrate (LOPRESSOR) 25 MG tablet TK 1 T PO BID  11  . pravastatin (  PRAVACHOL) 40 MG tablet Take 40 mg by mouth daily.       No current facility-administered medications for this visit.     ROS: See HPI for pertinent positives and negatives.   Physical Examination  Vitals:   11/04/16 1544 11/04/16 1547  BP: (!) 177/71 (!) 191/76  Pulse: (!) 55   Resp: 18   Temp: 97.1 F (36.2 C)   TempSrc: Oral   SpO2: 99%   Weight: 212 lb (96.2 kg)   Height: 6\' 1"  (1.854 m)    Body mass index is 27.97 kg/m.  General: A&O x 3, WDWN,  Gait: normal  Eyes: PERRLA,  Pulmonary: Respirations are non labored, CTAB, without wheezes, rales, or rhonchi  Cardiac: regular rhythm, no detected murmur   Carotid Bruits  Left  Right    Negative  Negative   Aorta is not  palpable Radial pulses: 2+ and =   VASCULAR EXAM:  Extremities without ischemic changes  without Gangrene; without open wounds.   LE Pulses  LEFT  RIGHT   FEMORAL  palpable  palpable   POPLITEAL  palpable  palpable   POSTERIOR TIBIAL  faintly palpable  Not palpable   DORSALIS PEDIS  ANTERIOR TIBIAL  not palpable  not palpable    Abdomen: soft, NT, no palpable masses.  Skin: no rashes, no ulcers.  Musculoskeletal: no muscle wasting or atrophy.  Neurologic: A&O X 3; Appropriate Affect, MOTOR FUNCTION: moving all extremities equally, motor strength 5/5 throughout. Speech is fluent/normal. CN 2-12 intact.    ASSESSMENT: Warren Mcdonald is a 83 y.o. male who is status post left above-knee popliteal to posterior tibial artery bypass graft in 2011. In 2010 he underwent angioplasty and recanalization of the right occluded tibioperoneal trunk and posterior tibial artery.  In 2014 he had elevated velocities within the right tibioperoneal trunk. Maximum velocity was 410 cm/s.  He has no signs of ischemia in his feet/legs, does not seem to have claudication symptoms.  His atherosclerotic risk factors include uncontrolled hypertension, DM, and advanced age. Fortunately he has never used tobacco.  He takes Eliquis for a history of paroxysmal atrial fib.  He also takes a daily ASA and a statin.   It is difficult to ascertain, but it seems he has run out of one or two medications, is not sure which ones, states he tried to get them refilled, needs to be called in from his PCP's office according to wife and pt.  Dr. Wyline Mood note from April 2018 indicates he might start an ACEI, pt was to follow up in 6 months with Dr. Wyline Mood.  Pt denies dyspnea or chest pain, denies headache or dizziness; his blood pressure is significantly elevated at this time. I advised him to notify Dr. Juanetta Gosling or Dr. Wyline Mood office today or tomorrow to ask for guidance re this.    DATA Left LE arterial Duplex (11-04-16): No internal vessel narrowing within the bypass graft or anastomosis. Elevated velocity in the distal posterior tibial artery which appears mostly due to a change in vessel diameter. Velocity change from 117 cm/s in distal popliteal artery to 282 cm/s at the origin of the anterior tibial artery. No significant change from exams of 06/27/14 and 08-10-15.    ABI (Date: 11/04/2016)  R:   ABI: 0.65 (0.75 on 08-10-15),   PT: mono  DP: absent  TBI:  0.80 (was 0.53)  L:   ABI: 0.94 (was 1.1),   PT: bi  DP: mono  TBI: 0.99 (was  0.73) Slight decline in bilateral ABI's: moderate arterial occlusive disease in the right lower extremity, mild in the left. However, bilateral TBI have improved to normal.    PLAN:  Graduated walking program and/or daily seated leg exercises discussed in detail.    Based on the patient's vascular studies and examination, pt will return to clinic in 1 year with ABI's and left LE arterial Duplex.  I discussed in depth with the patient the nature of atherosclerosis, and emphasized the importance of maximal medical management including strict control of blood pressure, blood glucose, and lipid levels, obtaining regular exercise, and continued cessation of smoking.  The patient is aware that without maximal medical management the underlying atherosclerotic disease process will progress, limiting the benefit of any interventions.  The patient was given information about PAD including signs, symptoms, treatment, what symptoms should prompt the patient to seek immediate medical care, and risk reduction measures to take.  Charisse March, RN, MSN, FNP-C [facility] Phone: [phone]  Clinic MD: Myra Gianotti  11/04/16 3:49 PM

## 2016-11-04 NOTE — Patient Instructions (Signed)

## 2016-11-15 NOTE — Addendum Note (Signed)
Addended by: Burton Apley A on: 11/15/2016 11:12 AM   Modules accepted: Orders

## 2017-04-09 ENCOUNTER — Other Ambulatory Visit (HOSPITAL_COMMUNITY): Payer: Self-pay | Admitting: Pulmonary Disease

## 2017-06-26 ENCOUNTER — Other Ambulatory Visit: Payer: Self-pay | Admitting: *Deleted

## 2017-06-26 ENCOUNTER — Other Ambulatory Visit: Payer: Self-pay

## 2017-06-26 MED ORDER — AMLODIPINE BESYLATE 10 MG PO TABS: ORAL_TABLET | ORAL | 0 refills | Status: DC

## 2017-06-26 NOTE — Telephone Encounter (Signed)
Refilled 1 month amlodipine, needs fu apt with Dr Wyline Mood, have messaged front desk to schedule

## 2017-06-30 ENCOUNTER — Encounter: Payer: Self-pay | Admitting: Cardiology

## 2017-06-30 ENCOUNTER — Ambulatory Visit: Payer: Medicare Other | Admitting: Cardiology

## 2017-06-30 VITALS — BP 140/86 | HR 83 | Ht 73.0 in | Wt 216.2 lb

## 2017-06-30 DIAGNOSIS — I4891 Unspecified atrial fibrillation: Secondary | ICD-10-CM | POA: Diagnosis not present

## 2017-06-30 DIAGNOSIS — I5032 Chronic diastolic (congestive) heart failure: Secondary | ICD-10-CM

## 2017-06-30 DIAGNOSIS — I10 Essential (primary) hypertension: Secondary | ICD-10-CM

## 2017-06-30 DIAGNOSIS — E782 Mixed hyperlipidemia: Secondary | ICD-10-CM

## 2017-06-30 MED ORDER — AMLODIPINE BESYLATE 10 MG PO TABS: ORAL_TABLET | ORAL | 1 refills | Status: DC

## 2017-06-30 NOTE — Progress Notes (Signed)
Clinical Summary Warren Mcdonald is a 84 y.o.male seen today for follow up of the following medical problems.   1. PAF - CHADS2Vasc score is 5. He is on anticoag.   - no bleeding on eliquis - no recent palpitations   2. Chronic diastoilc HF -- no recent edema. Can have some leg swelling, can have some orthopnea.   3. HTN - ran out of norvasc several days ago.   4. PAD - followed by vascular  5. DM2 - followed by pcp  6. Hyperlipidemia - compliant with meds     Past Medical History:  Diagnosis Date  . Essential hypertension   . Hyperlipidemia   . Peripheral vascular disease ([place]) 2011   Left above-knee popliteal to posterior tibial artery bypass   . PUD (peptic ulcer disease)   . Type 2 diabetes mellitus (HCC)      No Known Allergies   Current Outpatient Medications  Medication Sig Dispense Refill  . amLODipine (NORVASC) 10 MG tablet TAKE 1 TABLET BY MOUTH ONCE DAILY. 15 tablet 0  . apixaban (ELIQUIS) 5 MG TABS tablet Take 1 tablet (5 mg total) by mouth 2 (two) times daily. 60 tablet 5  . metFORMIN (GLUCOPHAGE) 500 MG tablet Take 500 mg by mouth 2 (two) times daily with a meal.      . metoprolol tartrate (LOPRESSOR) 25 MG tablet TK 1 T PO BID  11  . pravastatin (PRAVACHOL) 40 MG tablet Take 40 mg by mouth daily.       No current facility-administered medications for this visit.      Past Surgical History:  Procedure Laterality Date  . APPENDECTOMY    . CATARACT EXTRACTION W/ INTRAOCULAR LENS  IMPLANT, BILATERAL    . CHOLECYSTECTOMY N/A 09/08/2014   Procedure: CHOLECYSTECTOMY;  Surgeon: Aviva Signs Md, MD;  Location: [facility];  Service: General;  Laterality: N/A;  . EYE SURGERY    . PR VEIN BYPASS GRAFT,AORTO-FEM-POP     Left above knee popliteal to posterior tibial artery bypass graft     No Known Allergies    Family History  Problem Relation Age of Onset  . Heart disease Mother   . Hypertension Mother   . Heart attack Mother   . Heart  attack Father   . Heart disease Father        Heart disease before age 20  . Cancer Sister   . Deep vein thrombosis Sister   . Diabetes Sister        Amputation-right leg  . Cancer Daughter   . Diabetes Daughter   . Cancer Brother   . Diabetes Other   . Prostate cancer Other      Social History Mr. Leonhard reports that he quit smoking about 29 years ago. His smoking use included cigars. He quit smokeless tobacco use about 29 years ago. His smokeless tobacco use included chew. Mr. Koziol reports that he does not drink alcohol.   Review of Systems CONSTITUTIONAL: No weight loss, fever, chills, weakness or fatigue.  HEENT: Eyes: No visual loss, blurred vision, double vision or yellow sclerae.No hearing loss, sneezing, congestion, runny nose or sore throat.  SKIN: No rash or itching.  CARDIOVASCULAR: per hpi RESPIRATORY: No shortness of breath, cough or sputum.  GASTROINTESTINAL: No anorexia, nausea, vomiting or diarrhea. No abdominal pain or blood.  GENITOURINARY: No burning on urination, no polyuria NEUROLOGICAL: No headache, dizziness, syncope, paralysis, ataxia, numbness or tingling in the extremities. No change in bowel or  bladder control.  MUSCULOSKELETAL: No muscle, back pain, joint pain or stiffness.  LYMPHATICS: No enlarged nodes. No history of splenectomy.  PSYCHIATRIC: No history of depression or anxiety.  ENDOCRINOLOGIC: No reports of sweating, cold or heat intolerance. No polyuria or polydipsia.  Marland Kitchen   Physical Examination Vitals:   06/30/17 1546  BP: 140/86  Pulse: 83  SpO2: 94%   Vitals:   06/30/17 1546  Weight: 216 lb 3.2 oz (98.1 kg)  Height: 6' 1"  (1.854 m)    Gen: resting comfortably, no acute distress HEENT: no scleral icterus, pupils equal round and reactive, no palptable cervical adenopathy,  CV: RRR, no m/r/g,no jvd Resp: Clear to auscultation bilaterally GI: abdomen is soft, non-tender, non-distended, normal bowel sounds, no  hepatosplenomegaly MSK: extremities are warm, no edema.  Skin: warm, no rash Neuro:  no focal deficits Psych: appropriate affect   Diagnostic Studies 08/2014 echo Study Conclusions  - Left ventricle: The cavity size was normal. Wall thickness was increased in a pattern of mild LVH. Systolic function was normal. The estimated ejection fraction was in the range of 55% to 60%. Wall motion was normal; there were no regional wall motion abnormalities. Findings consistent with left ventricular diastolic dysfunction, ungraded in the setting of atrial fibrillation. - Aortic valve: Trileaflet; mildly thickened, mildly calcified leaflets. There was trivial regurgitation. - Mitral valve: Calcified annulus. Loose portion of mitral chordal structure (possible from anteromedial papillary) noted. There was mild regurgitation. - Left atrium: The atrium was mildly dilated. - Right atrium: The atrium was mildly dilated. - Tricuspid valve: There was moderate regurgitation. - Pulmonary arteries: PA peak pressure: 41 mm Hg (S). - Pericardium, extracardiac: There was no pericardial effusion.  Impressions:  - Mild LVH with LVEF 55-60%, features consistent with diastolic dysfunction. Mild biatrial enlargement. 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 Sclerotic aortic valve with trivial regurgitation. Moderate tricuspid regurgitation with mildly increased PASP 41 mmHg.     Assessment and Plan   1. PAF - no recent symptoms - continue current meds including anticoag  2. CHronic diastolic HF - no symptoms, continue curren tmeds  3. HTN - above goal of <130/80, he ran out of norvasc. We will refill.   4. Hyperlipidemia - continue statin, request pcp labs  F/u 6 months   Arnoldo Lenis, M.D.

## 2017-06-30 NOTE — Patient Instructions (Signed)

## 2017-07-03 ENCOUNTER — Encounter: Payer: Self-pay | Admitting: Cardiology

## 2017-07-21 DIAGNOSIS — H25811 Combined forms of age-related cataract, right eye: Secondary | ICD-10-CM | POA: Diagnosis not present

## 2017-07-21 DIAGNOSIS — H401113 Primary open-angle glaucoma, right eye, severe stage: Secondary | ICD-10-CM | POA: Diagnosis not present

## 2017-07-29 DIAGNOSIS — H209 Unspecified iridocyclitis: Secondary | ICD-10-CM | POA: Diagnosis not present

## 2017-08-06 ENCOUNTER — Ambulatory Visit: Payer: Medicare Other | Admitting: Cardiology

## 2017-11-03 LAB — BASIC METABOLIC PANEL
BUN: 23 — AB (ref 4–21)
CO2: 27 — AB (ref 13–22)
Chloride: 104 (ref 99–108)
Creatinine: 1.5 — AB (ref <=1.3)
Glucose: 154
Potassium: 4.3 (ref 3.4–5.3)
Sodium: 138 (ref 137–147)

## 2017-11-03 LAB — COMPREHENSIVE METABOLIC PANEL
Calcium: 9.8 (ref 8.7–10.7)
GFR calc Af Amer: 51
GFR calc non Af Amer: 44

## 2017-11-03 LAB — MICROALBUMIN, URINE: Microalb, Ur: 18.3

## 2017-11-04 LAB — HEMOGLOBIN A1C: Hemoglobin A1C: 7.8

## 2017-11-10 ENCOUNTER — Encounter: Payer: Self-pay | Admitting: Family

## 2017-11-10 ENCOUNTER — Ambulatory Visit (INDEPENDENT_AMBULATORY_CARE_PROVIDER_SITE_OTHER)
Admission: RE | Admit: 2017-11-10 | Discharge: 2017-11-10 | Disposition: A | Discharge status: Home / Self Care | Payer: Medicare Other | Source: Ambulatory Visit | Attending: Surgery | Admitting: Surgery

## 2017-11-10 ENCOUNTER — Other Ambulatory Visit: Payer: Self-pay | Admitting: *Deleted

## 2017-11-10 ENCOUNTER — Ambulatory Visit (HOSPITAL_COMMUNITY)
Admission: RE | Admit: 2017-11-10 | Discharge: 2017-11-10 | Disposition: A | Discharge status: Home / Self Care | Payer: Medicare Other | Source: Ambulatory Visit | Attending: Surgery | Admitting: Surgery

## 2017-11-10 ENCOUNTER — Ambulatory Visit: Payer: BC Managed Care – PPO | Admitting: Family

## 2017-11-10 VITALS — BP 136/81 | HR 90 | Temp 97.8°F | Resp 18 | Ht 74.0 in | Wt 213.0 lb

## 2017-11-10 DIAGNOSIS — I739 Peripheral vascular disease, unspecified: Secondary | ICD-10-CM | POA: Insufficient documentation

## 2017-11-10 DIAGNOSIS — E1151 Type 2 diabetes mellitus with diabetic peripheral angiopathy without gangrene: Secondary | ICD-10-CM

## 2017-11-10 DIAGNOSIS — Z95828 Presence of other vascular implants and grafts: Secondary | ICD-10-CM | POA: Insufficient documentation

## 2017-11-10 DIAGNOSIS — I779 Disorder of arteries and arterioles, unspecified: Secondary | ICD-10-CM | POA: Diagnosis not present

## 2017-11-10 MED ORDER — APIXABAN 5 MG PO TABS: 5.0000 mg | ORAL_TABLET | Freq: Two times a day (BID) | ORAL | 6 refills | Status: DC

## 2017-11-10 NOTE — Patient Instructions (Signed)

## 2017-11-10 NOTE — Progress Notes (Signed)
[facility] [place]   CC: Follow up peripheral artery occlusive disease  History of Present Illness Warren Mcdonald is a 84 y.o. male who is status post left above-knee popliteal to posterior tibial artery bypass graft in 2011 by Dr. Myra Gianotti. In 2010 he underwent angioplasty and recanalization of the right occluded tibioperoneal trunk and posterior tibial artery.  In 2014 he had elevated velocities within the right tibioperoneal trunk. Maximum velocity was 410 cm/s.   He returns today for re-evaluation of bilateral LE arterial perfusion.  It is difficult to ascertain, but it seems like he does not have claudication symptoms in either leg, but does have numbness in both feet.  He denies problems with falling, uses a cane for walking.  He states he received his rolling walker with a seat and appreciates it's use. Pt denies non-healing wounds.  Pt denies any history of stroke or TIA.   He had a cholecystectomy in 2016.   Diabetic: Yes, seems in good control, 6.8 A1C in 2016, most recent result from Dr. Juanetta Gosling office Tobacco use: non-smoker  Pt meds include:  Statin :Yes ASA: no Other anticoagulants/antiplatelets: Eliquis, he has paroxysmal atrial fib, ran out 2 weeks ago, apparently getting samples from his cardiologist.    Past Medical History:  Diagnosis Date  . Essential hypertension   . Hyperlipidemia   . Peripheral vascular disease (HCC) 2011   Left above-knee popliteal to posterior tibial artery bypass   . PUD (peptic ulcer disease)   . Type 2 diabetes mellitus (HCC)     Social History Social History   Tobacco Use  . Smoking status: Former Smoker    Types: Cigars    Last attempt to quit: 05/27/1988    Years since quitting: 29.4  . Smokeless tobacco: Former Neurosurgeon    Types: Chew    Quit date: 05/27/1988  Substance Use Topics  . Alcohol use: No    Alcohol/week: 0.0 oz  . Drug use: No    Family History Family History   Problem Relation Age of Onset  . Heart disease Mother   . Hypertension Mother   . Heart attack Mother   . Heart attack Father   . Heart disease Father        Heart disease before age 72  . Cancer Sister   . Deep vein thrombosis Sister   . Diabetes Sister        Amputation-right leg  . Cancer Daughter   . Diabetes Daughter   . Cancer Brother   . Diabetes Other   . Prostate cancer Other     Past Surgical History:  Procedure Laterality Date  . APPENDECTOMY    . CATARACT EXTRACTION W/ INTRAOCULAR LENS  IMPLANT, BILATERAL    . CHOLECYSTECTOMY N/A 09/08/2014   Procedure: CHOLECYSTECTOMY;  Surgeon: Franky Macho Md, MD;  Location: [facility];  Service: General;  Laterality: N/A;  . EYE SURGERY    . PR VEIN BYPASS GRAFT,AORTO-FEM-POP     Left above knee popliteal to posterior tibial artery bypass graft    No Known Allergies  Current Outpatient Medications  Medication Sig Dispense Refill  . amLODipine (NORVASC) 10 MG tablet TAKE 1 TABLET BY MOUTH ONCE DAILY. 90 tablet 1  . apixaban (ELIQUIS) 5 MG TABS tablet Take 1 tablet (5 mg total) by mouth 2 (two) times daily. 60 tablet 5  . metFORMIN (GLUCOPHAGE) 500 MG tablet Take 500 mg by mouth 2 (two) times daily with a meal.      .  metoprolol tartrate (LOPRESSOR) 25 MG tablet TK 1 T PO BID  11  . pravastatin (PRAVACHOL) 40 MG tablet Take 40 mg by mouth daily.       No current facility-administered medications for this visit.     ROS: See HPI for pertinent positives and negatives.   Physical Examination  Vitals:   11/10/17 1339  BP: 136/81  Pulse: 90  Resp: 18  Temp: 97.8 F (36.6 C)  TempSrc: Oral  SpO2: 100%  Weight: 213 lb (96.6 kg)  Height: 6\' 2"  (1.88 m)   Body mass index is 27.35 kg/m.  General: A&O x 3, WDWN, male. Gait: steady, using cane HENT: No gross abnormalities.  Eyes: PERRLA. Pulmonary: Respirations are non labored, CTAB, good air movement in all fields Cardiac: regular rhythm, no detected murmur.          Carotid Bruits Right Left   Negative Negative   Radial pulses are 2+ palpable bilaterally   Adominal aortic pulse is not palpable                         VASCULAR EXAM: Extremities without ischemic changes, without Gangrene; without open wounds.                                                                                                          LE Pulses Right Left       FEMORAL  not palpable  not palpable        POPLITEAL   palpable    palpable       POSTERIOR TIBIAL  NOT palpable   1+ palpable        DORSALIS PEDIS      ANTERIOR TIBIAL not palpable  1+ palpable    Abdomen: soft, NT, no palpable masses. Skin: no rashes, no cellulitis, no ulcers noted. Musculoskeletal: no muscle wasting or atrophy.  Neurologic: A&O X 3; appropriate affect, Sensation is normal; MOTOR FUNCTION:  moving all extremities equally, motor strength 5/5 throughout. Speech is sometimes difficult to understand, CN 2-12 intact. Psychiatric: Thought content is normal, mood appropriate for clinical situation.     ASSESSMENT: Warren Mcdonald is a 84 y.o. male who is status post left above-knee popliteal to posterior tibial artery bypass graft in 2011. In 2010 he underwent angioplasty and recanalization of the right occluded tibioperoneal trunk and posterior tibial artery.  In 2014 he had elevated velocities within the right tibioperoneal trunk. Maximum velocity was 410 cm/s.  He has no signs of ischemia in his feet/legs, does not seem to have claudication symptoms.  His atherosclerotic risk factors include controled hypertension, controled DM, and advanced age. Fortunately he has never used tobacco.  He takes Eliquis for a history of paroxysmal atrial fib.  He also takes a daily statin.   He ran out of his Eliquis about 2 weeks ago, as best can be ascertained, it seems he was getting samples at his cardiologist office. I advised pt and his wife to notify his cardiologist of this today.  DATA  Left LE arterial Duplex (11-10-17): No internal vessel narrowing within the bypass graft or anastomosis. All biphasic waveforms. Improved velocities compared to the exam on 11-04-16.     ABI (Date: 11/10/2017):  R:   ABI: 0.67 (was 0.65 on 11-04-16),   PT: mono  DP: mono  TBI:  0.53 (was 0.80)  L:   ABI: 0.93 (was 0.94),   PT: bi  DP: bi  TBI: 0.81 (was 0.99)  Stable bilateral ABI, moderate disease in the right with monophasic waveforms, mild disease in the left with biphasic waveforms.  Decline in bilateral TBI.     PLAN:  Graduated walking program and/or daily seated leg exercises discussed in detail.  Based on the patient's vascular studies and examination, pt will return to clinic in 1 year with ABI's and left LE arterial Duplex.   I discussed in depth with the patient the nature of atherosclerosis, and emphasized the importance of maximal medical management including strict control of blood pressure, blood glucose, and lipid levels, obtaining regular exercise, and continued cessation of smoking.  The patient is aware that without maximal medical management the underlying atherosclerotic disease process will progress, limiting the benefit of any interventions.  The patient was given information about PAD including signs, symptoms, treatment, what symptoms should prompt the patient to seek immediate medical care, and risk reduction measures to take.  Charisse March, RN, MSN, FNP-C [facility] Phone: [phone]  Clinic MD: Myra Gianotti  11/10/17 1:50 PM

## 2017-11-13 DIAGNOSIS — I4891 Unspecified atrial fibrillation: Secondary | ICD-10-CM | POA: Diagnosis not present

## 2017-11-13 DIAGNOSIS — J301 Allergic rhinitis due to pollen: Secondary | ICD-10-CM | POA: Diagnosis not present

## 2017-11-13 DIAGNOSIS — I5030 Unspecified diastolic (congestive) heart failure: Secondary | ICD-10-CM | POA: Diagnosis not present

## 2017-11-13 DIAGNOSIS — I10 Essential (primary) hypertension: Secondary | ICD-10-CM | POA: Diagnosis not present

## 2018-01-05 ENCOUNTER — Other Ambulatory Visit: Payer: Self-pay | Admitting: Cardiology

## 2018-04-16 DIAGNOSIS — I10 Essential (primary) hypertension: Secondary | ICD-10-CM | POA: Diagnosis not present

## 2018-04-16 DIAGNOSIS — Z23 Encounter for immunization: Secondary | ICD-10-CM | POA: Diagnosis not present

## 2018-04-16 DIAGNOSIS — I4891 Unspecified atrial fibrillation: Secondary | ICD-10-CM | POA: Diagnosis not present

## 2018-04-16 DIAGNOSIS — I5032 Chronic diastolic (congestive) heart failure: Secondary | ICD-10-CM | POA: Diagnosis not present

## 2018-04-16 DIAGNOSIS — E1169 Type 2 diabetes mellitus with other specified complication: Secondary | ICD-10-CM | POA: Diagnosis not present

## 2018-06-23 ENCOUNTER — Other Ambulatory Visit: Payer: Self-pay | Admitting: Cardiology

## 2018-07-08 DIAGNOSIS — Z0000 Encounter for general adult medical examination without abnormal findings: Secondary | ICD-10-CM | POA: Diagnosis not present

## 2018-07-08 DIAGNOSIS — Z23 Encounter for immunization: Secondary | ICD-10-CM | POA: Diagnosis not present

## 2018-07-08 LAB — HM DIABETES FOOT EXAM: HM Diabetic Foot Exam: DECREASED

## 2018-09-10 ENCOUNTER — Other Ambulatory Visit: Payer: Self-pay | Admitting: Cardiology

## 2018-10-03 ENCOUNTER — Other Ambulatory Visit: Payer: Self-pay | Admitting: Cardiology

## 2018-12-24 ENCOUNTER — Other Ambulatory Visit: Payer: Self-pay | Admitting: Cardiology

## 2019-01-11 DIAGNOSIS — E785 Hyperlipidemia, unspecified: Secondary | ICD-10-CM | POA: Diagnosis not present

## 2019-01-11 DIAGNOSIS — I10 Essential (primary) hypertension: Secondary | ICD-10-CM | POA: Diagnosis not present

## 2019-01-11 DIAGNOSIS — I5032 Chronic diastolic (congestive) heart failure: Secondary | ICD-10-CM | POA: Diagnosis not present

## 2019-01-11 DIAGNOSIS — I4891 Unspecified atrial fibrillation: Secondary | ICD-10-CM | POA: Diagnosis not present

## 2019-01-11 DIAGNOSIS — E119 Type 2 diabetes mellitus without complications: Secondary | ICD-10-CM | POA: Diagnosis not present

## 2019-03-11 ENCOUNTER — Other Ambulatory Visit: Payer: Self-pay | Admitting: *Deleted

## 2019-03-11 MED ORDER — APIXABAN 5 MG PO TABS: 5.0000 mg | ORAL_TABLET | Freq: Two times a day (BID) | ORAL | 0 refills | Status: DC

## 2019-04-13 DIAGNOSIS — Z23 Encounter for immunization: Secondary | ICD-10-CM | POA: Diagnosis not present

## 2019-04-13 DIAGNOSIS — I10 Essential (primary) hypertension: Secondary | ICD-10-CM | POA: Diagnosis not present

## 2019-04-13 DIAGNOSIS — E785 Hyperlipidemia, unspecified: Secondary | ICD-10-CM | POA: Diagnosis not present

## 2019-04-13 DIAGNOSIS — I4891 Unspecified atrial fibrillation: Secondary | ICD-10-CM | POA: Diagnosis not present

## 2019-04-13 DIAGNOSIS — I5032 Chronic diastolic (congestive) heart failure: Secondary | ICD-10-CM | POA: Diagnosis not present

## 2019-04-23 DIAGNOSIS — E785 Hyperlipidemia, unspecified: Secondary | ICD-10-CM | POA: Insufficient documentation

## 2019-04-23 DIAGNOSIS — I10 Essential (primary) hypertension: Secondary | ICD-10-CM | POA: Insufficient documentation

## 2019-04-23 DIAGNOSIS — I5032 Chronic diastolic (congestive) heart failure: Secondary | ICD-10-CM

## 2019-04-23 DIAGNOSIS — I5042 Chronic combined systolic (congestive) and diastolic (congestive) heart failure: Secondary | ICD-10-CM | POA: Insufficient documentation

## 2019-04-23 DIAGNOSIS — E1151 Type 2 diabetes mellitus with diabetic peripheral angiopathy without gangrene: Secondary | ICD-10-CM | POA: Insufficient documentation

## 2019-05-03 ENCOUNTER — Other Ambulatory Visit: Payer: Self-pay | Admitting: *Deleted

## 2019-05-03 MED ORDER — AMLODIPINE BESYLATE 10 MG PO TABS: 10.0000 mg | ORAL_TABLET | Freq: Every day | ORAL | 0 refills | Status: DC

## 2019-05-10 DIAGNOSIS — E119 Type 2 diabetes mellitus without complications: Secondary | ICD-10-CM | POA: Diagnosis not present

## 2019-05-10 DIAGNOSIS — H25812 Combined forms of age-related cataract, left eye: Secondary | ICD-10-CM | POA: Diagnosis not present

## 2019-05-10 DIAGNOSIS — Z01818 Encounter for other preprocedural examination: Secondary | ICD-10-CM | POA: Diagnosis not present

## 2019-05-10 DIAGNOSIS — H401133 Primary open-angle glaucoma, bilateral, severe stage: Secondary | ICD-10-CM | POA: Diagnosis not present

## 2019-05-18 ENCOUNTER — Telehealth: Payer: Self-pay | Admitting: *Deleted

## 2019-05-18 ENCOUNTER — Other Ambulatory Visit: Payer: Self-pay

## 2019-05-18 DIAGNOSIS — I779 Disorder of arteries and arterioles, unspecified: Secondary | ICD-10-CM

## 2019-05-18 NOTE — Telephone Encounter (Signed)
Call from patient's daughter. Patient has an open wound on left heel and "dead tissue on toe left foot" She denies any fever or chills. Patient did not schedule yearly follow up this summer with NP.I informed her that scheduling will call her to set up non-invasive vascular studies and appt with NP ASAP. Patient to go to [facility] ER for any acute or worsening condition. Verbalized understanding.

## 2019-05-18 NOTE — Progress Notes (Signed)
HISTORY AND PHYSICAL     CC:  follow up. Requesting Provider:  Kari BaarsHawkins, Edward, MD  HPI: This is a 85 y.o. male who is here today for follow up.  He has hx of left above knee popliteal to PTA bypass in 2011 by Dr. Myra GianottiBrabham.  In 2010, he underwent angioplasty and recanalization of the right occluded tibioperoneal trunk and posterior tibial artery.   He was last seen on 11/10/2017 by the NP.  At that time, he denied problems with falling and used a cane for walking.    The pt returns today with c/o a sore on his left 4th toe.  He also has a crack in the heel on the left.  He states his right foot has done well.  He states he does walk with his daughter but does not have any claudication sx.  He states that his toenail on the 4th toe has been this way for a couple of weeks.  He states that his foot will throb some when he lays flat. He denies any fevers/chills or drainage from his toe.  He does have some swelling in the left foot.   He denies any amaurosis fugax, hemiparesis or numbness or speech difficulties.     The pt is on a statin for cholesterol management.    The pt is not on an aspirin.    Other AC:  Eliquis with hx of Afib The pt is on CCB and BB for hypertension.  The pt does have diabetes. Tobacco hx:  remote   Past Medical History:  Diagnosis Date  . Abdominal distension (gaseous)   . Anemia, unspecified   . Anemia, unspecified   . Chronic diastolic (congestive) heart failure (HCC)   . Chronic diastolic (congestive) heart failure (HCC)   . Essential (primary) hypertension   . Essential hypertension   . Hyperlipidemia   . Hyperlipidemia, unspecified   . Peripheral vascular disease (HCC) 2011   Left above-knee popliteal to posterior tibial artery bypass   . Peripheral vascular disease, unspecified (HCC)   . PUD (peptic ulcer disease)   . Type 2 diabetes mellitus (HCC)   . Type 2 diabetes mellitus with diabetic peripheral angiopathy without gangrene (HCC)   . Type 2  diabetes mellitus with diabetic peripheral angiopathy without gangrene (HCC)   . Type 2 diabetes mellitus without complications (HCC)   . Unspecified atrial fibrillation [facility](HCC)     Past Surgical History:  Procedure Laterality Date  . APPENDECTOMY    . CATARACT EXTRACTION W/ INTRAOCULAR LENS  IMPLANT, BILATERAL    . CHOLECYSTECTOMY N/A 09/08/2014   Procedure: CHOLECYSTECTOMY;  Surgeon: Franky MachoMark Jenkins Md, MD;  Location: [facility];  Service: General;  Laterality: N/A;  . EYE SURGERY    . PR VEIN BYPASS GRAFT,AORTO-FEM-POP     Left above knee popliteal to posterior tibial artery bypass graft    No Known Allergies  Current Outpatient Medications  Medication Sig Dispense Refill  . amLODipine (NORVASC) 10 MG tablet Take 1 tablet (10 mg total) by mouth daily. 30 tablet 0  . apixaban (ELIQUIS) 5 MG TABS tablet Take 1 tablet (5 mg total) by mouth 2 (two) times daily. 60 tablet 0  . lubiprostone (AMITIZA) 24 MCG capsule Take 24 mcg by mouth 2 (two) times daily with a meal.    . metFORMIN (GLUCOPHAGE) 500 MG tablet Take 500 mg by mouth 2 (two) times daily with a meal.      . metoprolol tartrate (LOPRESSOR) 25 MG tablet TK  1 T PO BID  11  . pravastatin (PRAVACHOL) 40 MG tablet Take 40 mg by mouth daily.       No current facility-administered medications for this visit.    Family History  Problem Relation Age of Onset  . Heart disease Mother   . Hypertension Mother   . Heart attack Mother   . Heart attack Father   . Heart disease Father        Heart disease before age 68  . Cancer Sister   . Deep vein thrombosis Sister   . Diabetes Sister        Amputation-right leg  . Cancer Daughter   . Diabetes Daughter   . Cancer Brother   . Diabetes Other   . Prostate cancer Other     Social History   Socioeconomic History  . Marital status: Married    Spouse name: Not on file  . Number of children: Not on file  . Years of education: Not on file  . Highest education level: Not on file    Occupational History  . Not on file  Tobacco Use  . Smoking status: Former Smoker    Types: Cigars    Quit date: 05/27/1988    Years since quitting: 30.9  . Smokeless tobacco: Former Neurosurgeon    Types: Chew    Quit date: 05/27/1988  Substance and Sexual Activity  . Alcohol use: No    Alcohol/week: 0.0 standard drinks  . Drug use: No  . Sexual activity: Not on file  Other Topics Concern  . Not on file  Social History Narrative  . Not on file   Social Determinants of Health   Financial Resource Strain:   . Difficulty of Paying Living Expenses: Not on file  Food Insecurity:   . Worried About Programme researcher, broadcasting/film/video in the Last Year: Not on file  . Ran Out of Food in the Last Year: Not on file  Transportation Needs:   . Lack of Transportation (Medical): Not on file  . Lack of Transportation (Non-Medical): Not on file  Physical Activity:   . Days of Exercise per Week: Not on file  . Minutes of Exercise per Session: Not on file  Stress:   . Feeling of Stress : Not on file  Social Connections:   . Frequency of Communication with Friends and Family: Not on file  . Frequency of Social Gatherings with Friends and Family: Not on file  . Attends Religious Services: Not on file  . Active Member of Clubs or Organizations: Not on file  . Attends Banker Meetings: Not on file  . Marital Status: Not on file  Intimate Partner Violence:   . Fear of Current or Ex-Partner: Not on file  . Emotionally Abused: Not on file  . Physically Abused: Not on file  . Sexually Abused: Not on file     REVIEW OF SYSTEMS:   [X]  denotes positive finding, [ ]  denotes negative finding Cardiac  Comments:  Chest pain or chest pressure:    Shortness of breath upon exertion:    Short of breath when lying flat:    Irregular heart rhythm:        Vascular    Pain in calf, thigh, or hip brought on by ambulation:    Pain in feet at night that wakes you up from your sleep:     Blood clot in your  veins:    Leg swelling:  x  Pulmonary    Oxygen at home:    Productive cough:     Wheezing:         Neurologic    Sudden weakness in arms or legs:     Sudden numbness in arms or legs:     Sudden onset of difficulty speaking or slurred speech:    Temporary loss of vision in one eye:     Problems with dizziness:         Gastrointestinal    Blood in stool:     Vomited blood:         Genitourinary    Burning when urinating:     Blood in urine:        Psychiatric    Major depression:         Hematologic    Bleeding problems:    Problems with blood clotting too easily:        Skin    Rashes or ulcers:        Constitutional    Fever or chills:      PHYSICAL EXAMINATION:  Today's Vitals   05/19/19 1420  BP: (!) 141/76  Pulse: 95  Resp: 20  Temp: (!) 97 F (36.1 C)  TempSrc: Oral  SpO2: 100%  Weight: 213 lb (96.6 kg)   Body mass index is 27.35 kg/m.   General:  WDWN in NAD; vital signs documented above Gait: Not observed HENT: WNL, normocephalic Pulmonary: normal non-labored breathing , without Rales, rhonchi,  wheezing Cardiac: regular HR, without  Murmurs; with carotid bruits Abdomen: soft, NT, no masses Skin: without rashes Vascular Exam/Pulses:  Right Left  Radial 2+ (normal) 2+ (normal)  Femoral Unable to palpate due to pt being in wheelchair  Unable to palpate due to pt being in wheelchair   AT Unable to obtain with doppler Monophasic doppler  PT Biphasic doppler signal Monophasic doppler  Peroneal Monophasic doppler Monophasic doppler   Extremities with gangrenous left 4th toe and crack in left heel.        Musculoskeletal: no muscle wasting or atrophy  Neurologic: A&O X 3;  No focal weakness or paresthesias are detected Psychiatric:  The pt has Normal affect.   Non-Invasive Vascular Imaging:   ABI's/TBI's on 05/19/2019: Right:  0.80/0.73 Left:  0.45   Arterial duplex on 05/19/2019: Left Graft #1:  +--------------------+--------+-------------+----------+--------+                     PSV cm/sStenosis     Waveform  Comments +--------------------+--------+-------------+----------+--------+ Inflow              159                  triphasic          +--------------------+--------+-------------+----------+--------+ Proximal Anastomosis129                  biphasic           +--------------------+--------+-------------+----------+--------+ Proximal Graft      30                   triphasic          +--------------------+--------+-------------+----------+--------+ Mid Graft           23                   monophasic         +--------------------+--------+-------------+----------+--------+ Distal Graft        33 / 629>70% stenosismonophasic         +--------------------+--------+-------------+----------+--------+  Distal Anastomosis  15                   monophasicdampened +--------------------+--------+-------------+----------+--------+ Outflow             28                   monophasic         +--------------------+--------+-------------+----------+--------+  Summary: Left: > 70% stenosis in the distal segment of the bypass graft.  Previous ABI's/TBI's on 11/13/2018: Right:  0.67/0.53 Left:  0.93/0.81  Previous LLE arterial duplex on 11/13/2018: Left widely patent bypass graft.    ASSESSMENT/PLAN:: 85 y.o. male here for follow up now with non healing left 4th toe  -pt has decreased ABI's on the left from 0.93 in June 2019 to 0.45 today and more than 70% stenosis of the distal segment of the bypass graft.  Discussed with he and his granddaughter that he would need an arteriogram to evaluate his blood flow and possible intervention at the time of procedure.  Will plan on this for Monday with Dr. Randie Heinz.  He is on Eliquis and this will need to be held.   -he does have bilateral carotid bruits.  He is asymptomatic.  Will have him return in 4-6  weeks for a carotid duplex.  Discussed with he and his granddaughter to get to hospital should he have any neurologic sx.  They expressed understanding.     Doreatha Massed, PA-C [facility] [phone]  Clinic MD:   Edilia Bo

## 2019-05-19 ENCOUNTER — Encounter: Payer: Self-pay | Admitting: *Deleted

## 2019-05-19 ENCOUNTER — Ambulatory Visit (HOSPITAL_COMMUNITY)
Admission: RE | Admit: 2019-05-19 | Discharge: 2019-05-19 | Disposition: A | Discharge status: Home / Self Care | Payer: Medicare Other | Source: Ambulatory Visit | Attending: Family | Admitting: Family

## 2019-05-19 ENCOUNTER — Ambulatory Visit (INDEPENDENT_AMBULATORY_CARE_PROVIDER_SITE_OTHER): Payer: Medicare Other | Admitting: Physician Assistant

## 2019-05-19 ENCOUNTER — Other Ambulatory Visit: Payer: Self-pay

## 2019-05-19 ENCOUNTER — Ambulatory Visit (INDEPENDENT_AMBULATORY_CARE_PROVIDER_SITE_OTHER)
Admission: RE | Admit: 2019-05-19 | Discharge: 2019-05-19 | Disposition: A | Discharge status: Home / Self Care | Payer: Medicare Other | Source: Ambulatory Visit | Attending: Family | Admitting: Family

## 2019-05-19 VITALS — BP 141/76 | HR 95 | Temp 97.0°F | Resp 20 | Wt 213.0 lb

## 2019-05-19 DIAGNOSIS — I779 Disorder of arteries and arterioles, unspecified: Secondary | ICD-10-CM | POA: Insufficient documentation

## 2019-05-22 ENCOUNTER — Other Ambulatory Visit (HOSPITAL_COMMUNITY)
Admission: RE | Admit: 2019-05-22 | Discharge: 2019-05-22 | Disposition: A | Discharge status: Home / Self Care | Payer: Medicare Other | Source: Ambulatory Visit | Attending: Vascular Surgery | Admitting: Vascular Surgery

## 2019-05-22 DIAGNOSIS — Z20828 Contact with and (suspected) exposure to other viral communicable diseases: Secondary | ICD-10-CM | POA: Diagnosis not present

## 2019-05-22 DIAGNOSIS — Z01812 Encounter for preprocedural laboratory examination: Secondary | ICD-10-CM | POA: Insufficient documentation

## 2019-05-22 LAB — SARS CORONAVIRUS 2 (TAT 6-24 HRS): SARS Coronavirus 2: NEGATIVE

## 2019-05-24 ENCOUNTER — Ambulatory Visit (HOSPITAL_COMMUNITY)
Admission: RE | Admit: 2019-05-24 | Discharge: 2019-05-24 | Disposition: A | Discharge status: Home / Self Care | Payer: Medicare Other | Attending: Vascular Surgery | Admitting: Vascular Surgery

## 2019-05-24 ENCOUNTER — Other Ambulatory Visit: Payer: Self-pay

## 2019-05-24 ENCOUNTER — Encounter (HOSPITAL_COMMUNITY)
Admission: RE | Disposition: A | Discharge status: Home / Self Care | Payer: Self-pay | Source: Home / Self Care | Attending: Vascular Surgery

## 2019-05-24 DIAGNOSIS — I110 Hypertensive heart disease with heart failure: Secondary | ICD-10-CM | POA: Insufficient documentation

## 2019-05-24 DIAGNOSIS — Z87891 Personal history of nicotine dependence: Secondary | ICD-10-CM | POA: Diagnosis not present

## 2019-05-24 DIAGNOSIS — E11621 Type 2 diabetes mellitus with foot ulcer: Secondary | ICD-10-CM | POA: Diagnosis not present

## 2019-05-24 DIAGNOSIS — I96 Gangrene, not elsewhere classified: Secondary | ICD-10-CM | POA: Diagnosis not present

## 2019-05-24 DIAGNOSIS — L97529 Non-pressure chronic ulcer of other part of left foot with unspecified severity: Secondary | ICD-10-CM | POA: Diagnosis not present

## 2019-05-24 DIAGNOSIS — I4891 Unspecified atrial fibrillation: Secondary | ICD-10-CM | POA: Diagnosis not present

## 2019-05-24 DIAGNOSIS — Z7984 Long term (current) use of oral hypoglycemic drugs: Secondary | ICD-10-CM | POA: Diagnosis not present

## 2019-05-24 DIAGNOSIS — I70245 Atherosclerosis of native arteries of left leg with ulceration of other part of foot: Secondary | ICD-10-CM | POA: Diagnosis present

## 2019-05-24 DIAGNOSIS — Z7901 Long term (current) use of anticoagulants: Secondary | ICD-10-CM | POA: Diagnosis not present

## 2019-05-24 DIAGNOSIS — E785 Hyperlipidemia, unspecified: Secondary | ICD-10-CM | POA: Insufficient documentation

## 2019-05-24 DIAGNOSIS — I5032 Chronic diastolic (congestive) heart failure: Secondary | ICD-10-CM | POA: Diagnosis not present

## 2019-05-24 DIAGNOSIS — Z79899 Other long term (current) drug therapy: Secondary | ICD-10-CM | POA: Diagnosis not present

## 2019-05-24 DIAGNOSIS — E1152 Type 2 diabetes mellitus with diabetic peripheral angiopathy with gangrene: Secondary | ICD-10-CM | POA: Insufficient documentation

## 2019-05-24 HISTORY — PX: PERIPHERAL VASCULAR BALLOON ANGIOPLASTY: CATH118281

## 2019-05-24 HISTORY — PX: ABDOMINAL AORTOGRAM: CATH118222

## 2019-05-24 HISTORY — PX: LOWER EXTREMITY ANGIOGRAPHY: CATH118251

## 2019-05-24 LAB — GLUCOSE, CAPILLARY
Glucose-Capillary: 153 mg/dL — ABNORMAL HIGH (ref 70–99)
Glucose-Capillary: 151 mg/dL — ABNORMAL HIGH (ref 70–99)

## 2019-05-24 LAB — BASIC METABOLIC PANEL
Anion gap: 12 (ref 5–15)
BUN: 17 mg/dL (ref 8–23)
CO2: 23 mmol/L (ref 22–32)
Calcium: 9.7 mg/dL (ref 8.9–10.3)
Chloride: 103 mmol/L (ref 98–111)
Creatinine, Ser: 1.46 mg/dL — ABNORMAL HIGH (ref 0.61–1.24)
GFR calc Af Amer: 49 mL/min — ABNORMAL LOW (ref >60)
GFR calc non Af Amer: 43 mL/min — ABNORMAL LOW (ref >60)
Glucose, Bld: 169 mg/dL — ABNORMAL HIGH (ref 70–99)
Potassium: 3.9 mmol/L (ref 3.5–5.1)
Sodium: 138 mmol/L (ref 135–145)

## 2019-05-24 SURGERY — LOWER EXTREMITY ANGIOGRAPHY
Anesthesia: LOCAL

## 2019-05-24 MED ORDER — LABETALOL HCL 5 MG/ML IV SOLN: 10.0000 mg | INTRAVENOUS | Status: DC | PRN

## 2019-05-24 MED ORDER — MIDAZOLAM HCL 2 MG/2ML IJ SOLN
INTRAMUSCULAR | Status: AC
  Filled 2019-05-24: qty 2

## 2019-05-24 MED ORDER — LIDOCAINE HCL (PF) 1 % IJ SOLN
INTRAMUSCULAR | Status: DC | PRN
  Administered 2019-05-24: 20 mL via SUBCUTANEOUS

## 2019-05-24 MED ORDER — SODIUM CHLORIDE 0.9 % IV SOLN: 250.0000 mL | INTRAVENOUS | Status: DC | PRN

## 2019-05-24 MED ORDER — SODIUM CHLORIDE 0.9% FLUSH: 3.0000 mL | Freq: Two times a day (BID) | INTRAVENOUS | Status: DC

## 2019-05-24 MED ORDER — ASPIRIN EC 81 MG PO TBEC: 81.0000 mg | DELAYED_RELEASE_TABLET | Freq: Every day | ORAL | Status: DC

## 2019-05-24 MED ORDER — HYDRALAZINE HCL 20 MG/ML IJ SOLN: 5.0000 mg | INTRAMUSCULAR | Status: DC | PRN

## 2019-05-24 MED ORDER — SODIUM CHLORIDE 0.9 % WEIGHT BASED INFUSION: 1.0000 mL/kg/h | INTRAVENOUS | Status: DC

## 2019-05-24 MED ORDER — HEPARIN SODIUM (PORCINE) 1000 UNIT/ML IJ SOLN
INTRAMUSCULAR | Status: DC | PRN
  Administered 2019-05-24: 10000 [IU] via INTRAVENOUS

## 2019-05-24 MED ORDER — HEPARIN SODIUM (PORCINE) 1000 UNIT/ML IJ SOLN
INTRAMUSCULAR | Status: AC
  Filled 2019-05-24: qty 1

## 2019-05-24 MED ORDER — FENTANYL CITRATE (PF) 100 MCG/2ML IJ SOLN
INTRAMUSCULAR | Status: AC
  Filled 2019-05-24: qty 2

## 2019-05-24 MED ORDER — LIDOCAINE HCL (PF) 1 % IJ SOLN
INTRAMUSCULAR | Status: AC
  Filled 2019-05-24: qty 30

## 2019-05-24 MED ORDER — HEPARIN (PORCINE) IN NACL 1000-0.9 UT/500ML-% IV SOLN
INTRAVENOUS | Status: AC
  Filled 2019-05-24: qty 1000

## 2019-05-24 MED ORDER — SODIUM CHLORIDE 0.9% FLUSH: 3.0000 mL | INTRAVENOUS | Status: DC | PRN

## 2019-05-24 MED ORDER — FENTANYL CITRATE (PF) 100 MCG/2ML IJ SOLN
INTRAMUSCULAR | Status: DC | PRN
  Administered 2019-05-24: 25 ug via INTRAVENOUS

## 2019-05-24 MED ORDER — ONDANSETRON HCL 4 MG/2ML IJ SOLN: 4.0000 mg | Freq: Four times a day (QID) | INTRAMUSCULAR | Status: DC | PRN

## 2019-05-24 MED ORDER — MIDAZOLAM HCL 2 MG/2ML IJ SOLN
INTRAMUSCULAR | Status: DC | PRN
  Administered 2019-05-24: 1 mg via INTRAVENOUS

## 2019-05-24 MED ORDER — ACETAMINOPHEN 325 MG PO TABS: 650.0000 mg | ORAL_TABLET | ORAL | Status: DC | PRN

## 2019-05-24 MED ORDER — HEPARIN (PORCINE) IN NACL 1000-0.9 UT/500ML-% IV SOLN
INTRAVENOUS | Status: DC | PRN
  Administered 2019-05-24 (×2): 500 mL

## 2019-05-24 MED ORDER — IODIXANOL 320 MG/ML IV SOLN
INTRAVENOUS | Status: DC | PRN
  Administered 2019-05-24: 30 mL

## 2019-05-24 SURGICAL SUPPLY — 21 items
BALL STERLING OTW 2.5X150X150 (BALLOONS) ×1
BALLN STERLING OTW 2.5X150X150 (BALLOONS) ×2
BALLOON STRLNG OTW 2.5X150X150 (BALLOONS) ×2 IMPLANT
CATH OMNI FLUSH 5F 65CM (CATHETERS) ×3 IMPLANT
CATH TEMPO AQUA 5F 100CM (CATHETERS) ×3 IMPLANT
CLOSURE MYNX CONTROL 6F/7F (Vascular Products) ×3 IMPLANT
FILTER CO2 0.2 MICRON (VASCULAR PRODUCTS) ×3 IMPLANT
KIT ENCORE 26 ADVANTAGE (KITS) ×3 IMPLANT
KIT MICROPUNCTURE NIT STIFF (SHEATH) ×3 IMPLANT
KIT PV (KITS) ×3 IMPLANT
RESERVOIR CO2 (VASCULAR PRODUCTS) ×3 IMPLANT
SET FLUSH CO2 (MISCELLANEOUS) ×3 IMPLANT
SHEATH HIGHFLEX ANSEL 6FRX55 (SHEATH) ×3 IMPLANT
SHEATH PINNACLE 5F 10CM (SHEATH) ×3 IMPLANT
SHEATH PINNACLE 6F 10CM (SHEATH) ×3 IMPLANT
SHEATH PROBE COVER 6X72 (BAG) ×3 IMPLANT
TRANSDUCER W/STOPCOCK (MISCELLANEOUS) ×3 IMPLANT
TRAY PV CATH (CUSTOM PROCEDURE TRAY) ×3 IMPLANT
WIRE BENTSON .035X145CM (WIRE) ×3 IMPLANT
WIRE G V18X300CM (WIRE) ×3 IMPLANT
WIRE ROSEN-J .035X260CM (WIRE) ×3 IMPLANT

## 2019-05-24 NOTE — Progress Notes (Signed)
No bleeding or hematoma noted after ambulation 

## 2019-05-24 NOTE — Discharge Instructions (Signed)
Femoral Site Care °This sheet gives you information about how to care for yourself after your procedure. Your health care provider may also give you more specific instructions. If you have problems or questions, contact your health care provider. °What can I expect after the procedure? °After the procedure, it is common to have: °· Bruising that usually fades within 1-2 weeks. °· Tenderness at the site. °Follow these instructions at home: °Wound care °· Follow instructions from your health care provider about how to take care of your insertion site. Make sure you: °? Wash your hands with soap and water before you change your bandage (dressing). If soap and water are not available, use hand sanitizer. °? Change your dressing as told by your health care provider. °? Leave stitches (sutures), skin glue, or adhesive strips in place. These skin closures may need to stay in place for 2 weeks or longer. If adhesive strip edges start to loosen and curl up, you may trim the loose edges. Do not remove adhesive strips completely unless your health care provider tells you to do that. °· Do not take baths, swim, or use a hot tub until your health care provider approves. °· You may shower 24-48 hours after the procedure or as told by your health care provider. °? Gently wash the site with plain soap and water. °? Pat the area dry with a clean towel. °? Do not rub the site. This may cause bleeding. °· Do not apply powder or lotion to the site. Keep the site clean and dry. °· Check your femoral site every day for signs of infection. Check for: °? Redness, swelling, or pain. °? Fluid or blood. °? Warmth. °? Pus or a bad smell. °Activity °· For the first 2-3 days after your procedure, or as long as directed: °? Avoid climbing stairs as much as possible. °? Do not squat. °· Do not lift anything that is heavier than 10 lb (4.5 kg), or the limit that you are told, until your health care provider says that it is safe. °· Rest as  directed. °? Avoid sitting for a long time without moving. Get up to take short walks every 1-2 hours. °· Do not drive for 24 hours if you were given a medicine to help you relax (sedative). °General instructions °· Take over-the-counter and prescription medicines only as told by your health care provider. °· Keep all follow-up visits as told by your health care provider. This is important. °Contact a health care provider if you have: °· A fever or chills. °· You have redness, swelling, or pain around your insertion site. °Get help right away if: °· The catheter insertion area swells very fast. °· You pass out. °· You suddenly start to sweat or your skin gets clammy. °· The catheter insertion area is bleeding, and the bleeding does not stop when you hold steady pressure on the area. °· The area near or just beyond the catheter insertion site becomes pale, cool, tingly, or numb. °These symptoms may represent a serious problem that is an emergency. Do not wait to see if the symptoms will go away. Get medical help right away. Call your local emergency services (911 in the [place] Do not drive yourself to the hospital. °Summary °· After the procedure, it is common to have bruising that usually fades within 1-2 weeks. °· Check your femoral site every day for signs of infection. °· Do not lift anything that is heavier than 10 lb (4.5 kg), or the   limit that you are told, until your health care provider says that it is safe. °This information is not intended to replace advice given to you by your health care provider. Make sure you discuss any questions you have with your health care provider. °Document Released: 01/14/2014 Document Revised: 05/26/2017 Document Reviewed: 05/26/2017 °Elsevier Patient Education © 2020 Elsevier Inc[employer] ° °

## 2019-05-24 NOTE — Op Note (Signed)
    Patient name: RIHAAN BARRACK MRN: [id number] DOB: 1929/08/21 Sex: male  05/24/2019 Pre-operative Diagnosis: Critical left lower extremity ischemia with toe ulceration Post-operative diagnosis:  Same Surgeon:  Erlene Quan C. Donzetta Matters, MD Procedure Performed: 1.  Ultrasound-guided cannulation right common femoral artery 2.  CO2 Aortogram with left lower extremity angiogram 3.  Plain balloon angioplasty left posterior tibial artery and bypass graft stenosis with 2.5 mm balloon 4.  Minx device closure right common femoral artery 5.  Moderate sedation with fentanyl and Versed for 36 minutes   Indications: 85 year old male with history of endovascular right lower extremity intervention.  Has a previous left popliteal to posterior tibial artery bypass.  He now has fourth toe ulceration.  He is indicated for angiogram possible intervention on the outflow of the bypass.  Findings: Aorta and iliac segments appear patent.  Left SFA was patent to the level of the bypass.  Flow was preferentially into the occluded popliteal over the bypass.  Distally the bypass was subtotally occluded into the posterior tibial artery.  After intervention there is no residual stenosis or dissection with good flow into the foot via the posterior tibial artery.   Procedure:  The patient was identified in the holding area and taken to room 8.  The patient was then placed supine on the table and prepped and draped in the usual sterile fashion.  A time out was called.  Ultrasound was used to evaluate the right common femoral artery was noted to be patent and compressible.  There is anesthetized 1% lidocaine cannulated with direct ultrasound visualization a micropuncture needle followed by wire and sheath.  And images saved the permanent record.  Moderate sedation was administered with fentanyl and Versed and his vital signs were monitored throughout the course of the case.  Micropuncture sheath was placed followed by Bentson wire and  5 French sheath.  Omni catheter placed to level of L1 and CO2 angiogram performed.  We then crossed bifurcation perform CO2 angiography to the level of the knee.  We then placed the catheter into the bypass and performed contrasted angiography which demonstrated the above subtotal occlusion of the bypass.  We then exchanged over a Rosen wire for a long 6 French sheath the patient was fully heparinized.  We crossed the occlusive disease segment of the distal bypass and PT with V 18 wire.  This was then ballooned with 2.5 mm balloon at nominal pressure for 2 minutes.  Completion demonstrated no residual stenosis no dissection.  Satisfied we remove the wire and sheath exchanged for short 6 French sheath and deployed a minx device in the right common femoral artery.  He tolerated procedure well that immediate complication.  Contrast: 30cc   Amire Gossen C. Donzetta Matters, MD [facility] [place] Office: [phone] Pager: [phone]

## 2019-05-24 NOTE — H&P (Signed)
   History and Physical Update  The patient was interviewed and re-examined.  The patient's previous History and Physical has been reviewed and is unchanged from recent office visit. Plan for aortogram possible intervention on the left.   Terin Cragle C. Donzetta Matters, MD [facility] [place] Office: [phone] Pager: [phone]  05/24/2019, 10:49 AM

## 2019-05-27 ENCOUNTER — Other Ambulatory Visit: Payer: Self-pay | Admitting: *Deleted

## 2019-05-27 LAB — POCT I-STAT, CHEM 8
BUN: 31 mg/dL — ABNORMAL HIGH (ref 8–23)
Calcium, Ion: 1.01 mmol/L — ABNORMAL LOW (ref 1.15–1.40)
Chloride: 107 mmol/L (ref 98–111)
Creatinine, Ser: 1.4 mg/dL — ABNORMAL HIGH (ref 0.61–1.24)
Glucose, Bld: 169 mg/dL — ABNORMAL HIGH (ref 70–99)
HCT: 42 % (ref 39.0–52.0)
Hemoglobin: 14.3 g/dL (ref 13.0–17.0)
Potassium: 8.1 mmol/L (ref 3.5–5.1)
Sodium: 135 mmol/L (ref 135–145)
TCO2: 27 mmol/L (ref 22–32)

## 2019-05-27 MED ORDER — AMLODIPINE BESYLATE 10 MG PO TABS: 10.0000 mg | ORAL_TABLET | Freq: Every day | ORAL | 0 refills | Status: DC

## 2019-06-03 ENCOUNTER — Other Ambulatory Visit: Payer: Self-pay | Admitting: *Deleted

## 2019-06-03 DIAGNOSIS — I779 Disorder of arteries and arterioles, unspecified: Secondary | ICD-10-CM

## 2019-06-14 ENCOUNTER — Other Ambulatory Visit (HOSPITAL_COMMUNITY)
Admission: RE | Admit: 2019-06-14 | Discharge: 2019-06-14 | Disposition: A | Discharge status: Home / Self Care | Payer: Medicare Other | Source: Ambulatory Visit | Attending: Surgery | Admitting: Surgery

## 2019-06-14 ENCOUNTER — Encounter: Payer: Self-pay | Admitting: Surgery

## 2019-06-14 ENCOUNTER — Other Ambulatory Visit: Payer: Self-pay | Admitting: *Deleted

## 2019-06-14 ENCOUNTER — Other Ambulatory Visit: Payer: Self-pay

## 2019-06-14 ENCOUNTER — Ambulatory Visit (INDEPENDENT_AMBULATORY_CARE_PROVIDER_SITE_OTHER): Payer: Medicare Other | Admitting: Surgery

## 2019-06-14 ENCOUNTER — Ambulatory Visit (INDEPENDENT_AMBULATORY_CARE_PROVIDER_SITE_OTHER)
Admission: RE | Admit: 2019-06-14 | Discharge: 2019-06-14 | Disposition: A | Discharge status: Home / Self Care | Payer: Medicare Other | Source: Ambulatory Visit | Attending: Surgery | Admitting: Surgery

## 2019-06-14 ENCOUNTER — Ambulatory Visit (HOSPITAL_COMMUNITY)
Admission: RE | Admit: 2019-06-14 | Discharge: 2019-06-14 | Disposition: A | Discharge status: Home / Self Care | Payer: Medicare Other | Source: Ambulatory Visit | Attending: Surgery | Admitting: Surgery

## 2019-06-14 ENCOUNTER — Encounter: Payer: Self-pay | Admitting: *Deleted

## 2019-06-14 VITALS — BP 145/87 | HR 100 | Temp 98.0°F | Resp 20 | Ht 73.0 in | Wt 215.0 lb

## 2019-06-14 DIAGNOSIS — Z01818 Encounter for other preprocedural examination: Secondary | ICD-10-CM | POA: Diagnosis not present

## 2019-06-14 DIAGNOSIS — I779 Disorder of arteries and arterioles, unspecified: Secondary | ICD-10-CM

## 2019-06-14 DIAGNOSIS — Z20822 Contact with and (suspected) exposure to covid-19: Secondary | ICD-10-CM | POA: Diagnosis not present

## 2019-06-14 DIAGNOSIS — I70245 Atherosclerosis of native arteries of left leg with ulceration of other part of foot: Secondary | ICD-10-CM

## 2019-06-14 DIAGNOSIS — E1151 Type 2 diabetes mellitus with diabetic peripheral angiopathy without gangrene: Secondary | ICD-10-CM | POA: Diagnosis not present

## 2019-06-14 DIAGNOSIS — Z87820 Personal history of traumatic brain injury: Secondary | ICD-10-CM | POA: Insufficient documentation

## 2019-06-14 LAB — SARS CORONAVIRUS 2 (TAT 6-24 HRS): SARS Coronavirus 2: NEGATIVE

## 2019-06-14 NOTE — Progress Notes (Signed)
[facility] [place]  Patient name: Warren Mcdonald MRN: [id number] DOB: 09/05/1929 Sex: male   REASON FOR VISIT:    Follow up  HISOTRY OF PRESENT ILLNESS:    Warren Mcdonald is a 86 y.o. male who is status post left above-knee popliteal to posterior tibial artery bypass graft in 2011.  In 2010 he had undergone angioplasty and recanalization of an occluded right  tibioperoneal trunk and posterior tibial artery. On 05/24/2019 he underwent angiography and balloon angioplasty of his bypass graft.  This was done for bypass graft stenosis as well as a fourth toe wound.  The patient is diabetic.  He takes a statin for hypercholesterolemia.  He is medically managed for hypertension.  He is on Eliquis  PAST MEDICAL HISTORY:   Past Medical History:  Diagnosis Date  . Abdominal distension (gaseous)   . Anemia, unspecified   . Anemia, unspecified   . Chronic diastolic (congestive) heart failure ([place])   . Chronic diastolic (congestive) heart failure ([place])   . Essential (primary) hypertension   . Essential hypertension   . Hyperlipidemia   . Hyperlipidemia, unspecified   . Peripheral vascular disease ([place]) 2011   Left above-knee popliteal to posterior tibial artery bypass   . Peripheral vascular disease, unspecified ([place])   . PUD (peptic ulcer disease)   . Type 2 diabetes mellitus ([place])   . Type 2 diabetes mellitus with diabetic peripheral angiopathy without gangrene ([place])   . Type 2 diabetes mellitus with diabetic peripheral angiopathy without gangrene ([place])   . Type 2 diabetes mellitus without complications ([place])   . Unspecified atrial fibrillation (HCC)      FAMILY HISTORY:   Family History  Problem Relation Age of Onset  . Heart disease Mother   . Hypertension Mother   . Heart attack Mother   . Heart attack Father   . Heart disease Father        Heart disease before age 27  . Cancer Sister   . Deep vein  thrombosis Sister   . Diabetes Sister        Amputation-right leg  . Cancer Daughter   . Diabetes Daughter   . Cancer Brother   . Diabetes Other   . Prostate cancer Other     SOCIAL HISTORY:   Social History   Tobacco Use  . Smoking status: Former Smoker    Types: Cigars    Quit date: 05/27/1988    Years since quitting: 31.0  . Smokeless tobacco: Former Systems developer    Types: [place] date: 05/27/1988  Substance Use Topics  . Alcohol use: No    Alcohol/week: 0.0 standard drinks     ALLERGIES:   No Known Allergies   CURRENT MEDICATIONS:   Current Outpatient Medications  Medication Sig Dispense Refill  . amLODipine (NORVASC) 10 MG tablet Take 1 tablet (10 mg total) by mouth daily. 15 tablet 0  . apixaban (ELIQUIS) 5 MG TABS tablet Take 1 tablet (5 mg total) by mouth 2 (two) times daily. 60 tablet 0  . cetirizine (ZYRTEC) 10 MG tablet Take 10 mg by mouth at bedtime.    Marland Kitchen latanoprost (XALATAN) 0.005 % ophthalmic solution 1 drop at bedtime.    Marland Kitchen lubiprostone (AMITIZA) 24 MCG capsule Take 24 mcg by mouth 2 (two) times daily with a meal.    . metFORMIN (GLUCOPHAGE) 500 MG tablet Take 500 mg by mouth 2 (two) times daily with a meal.      .  metoprolol tartrate (LOPRESSOR) 25 MG tablet TK 1 T PO BID  11  . pravastatin (PRAVACHOL) 40 MG tablet Take 40 mg by mouth daily.       No current facility-administered medications for this visit.    REVIEW OF SYSTEMS:   [X] denotes positive finding, [ ] denotes negative finding Cardiac  Comments:  Chest pain or chest pressure:    Shortness of breath upon exertion:    Short of breath when lying flat:    Irregular heart rhythm:        Vascular    Pain in calf, thigh, or hip brought on by ambulation:    Pain in feet at night that wakes you up from your sleep:     Blood clot in your veins:    Leg swelling:         Pulmonary    Oxygen at home:    Productive cough:     Wheezing:         Neurologic    Sudden weakness in arms or legs:      Sudden numbness in arms or legs:     Sudden onset of difficulty speaking or slurred speech:    Temporary loss of vision in one eye:     Problems with dizziness:         Gastrointestinal    Blood in stool:     Vomited blood:         Genitourinary    Burning when urinating:     Blood in urine:        Psychiatric    Major depression:         Hematologic    Bleeding problems:    Problems with blood clotting too easily:        Skin    Rashes or ulcers:        Constitutional    Fever or chills:      PHYSICAL EXAM:   Vitals:   06/14/19 0932  BP: (!) 145/87  Pulse: 100  Resp: 20  Temp: 98 F (36.7 C)  SpO2: 100%  Weight: 215 lb (97.5 kg)  Height: 6' 1" (1.854 m)    GENERAL: The patient is a well-nourished male, in no acute distress. The vital signs are documented above. CARDIAC: There is a regular rate and rhythm.  VASCULAR: Bilateral lower extremity edema.  I cannot palpate a left pedal pulse PULMONARY: Non-labored respirations ABDOMEN: Soft and non-tender with normal pitched bowel sounds.  MUSCULOSKELETAL: There are no major deformities or cyanosis. NEUROLOGIC: No focal weakness or paresthesias are detected. SKIN: Gangrenous left fourth toe PSYCHIATRIC: The patient has a normal affect.  STUDIES:   I have ordered and reviewed the following: Left: Graft appears widely patent; however, the inflow artery is difficult to visualize due to edema.  ABI/TBIToday's ABIToday's TBIPrevious ABIPrevious TBI +-------+-----------+-----------+------------+------------+ Right  1.04       0.63       0.80        0.73         +-------+-----------+-----------+------------+------------+ Left   1.21       0.60       0.45        0.00         +-------+-----------+-----------+------------+------------+  Left toe: 88 Right toe: 93 MEDICAL ISSUES:   Atherosclerotic vascular disease with ulceration: The patient recent underwent revascularization and angioplasty  of a high-grade stenosis within his left posterior tibial bypass graft.  This was done for gangrenous   changes to his left fourth toe.  Ultrasound today shows that his bypass graft is now widely patent.  He has adequate toe pressures for wound healing.  I discussed with the patient and his daughter via telephone that amputation of the fourth toe is recommended because of the foul-smelling odor and its gangrenous appearance.  We discussed the possibility that he may have to have packing in order to get this to heal.  I am going to stop his Eliquis.  He will be scheduled for amputation in the immediate future.  This will be done as an outpatient.    Charlena Cross, MD, FACS [facility] [phone] Pager [phone]

## 2019-06-14 NOTE — H&P (View-Only) (Signed)
[facility] [place]  Patient name: Warren Mcdonald MRN: [id number] DOB: 09/05/1929 Sex: male   REASON FOR VISIT:    Follow up  HISOTRY OF PRESENT ILLNESS:    Warren Mcdonald is a 86 y.o. male who is status post left above-knee popliteal to posterior tibial artery bypass graft in 2011.  In 2010 he had undergone angioplasty and recanalization of an occluded right  tibioperoneal trunk and posterior tibial artery. On 05/24/2019 he underwent angiography and balloon angioplasty of his bypass graft.  This was done for bypass graft stenosis as well as a fourth toe wound.  The patient is diabetic.  He takes a statin for hypercholesterolemia.  He is medically managed for hypertension.  He is on Eliquis  PAST MEDICAL HISTORY:   Past Medical History:  Diagnosis Date  . Abdominal distension (gaseous)   . Anemia, unspecified   . Anemia, unspecified   . Chronic diastolic (congestive) heart failure ([place])   . Chronic diastolic (congestive) heart failure ([place])   . Essential (primary) hypertension   . Essential hypertension   . Hyperlipidemia   . Hyperlipidemia, unspecified   . Peripheral vascular disease ([place]) 2011   Left above-knee popliteal to posterior tibial artery bypass   . Peripheral vascular disease, unspecified ([place])   . PUD (peptic ulcer disease)   . Type 2 diabetes mellitus ([place])   . Type 2 diabetes mellitus with diabetic peripheral angiopathy without gangrene ([place])   . Type 2 diabetes mellitus with diabetic peripheral angiopathy without gangrene ([place])   . Type 2 diabetes mellitus without complications ([place])   . Unspecified atrial fibrillation ([facility])      FAMILY HISTORY:   Family History  Problem Relation Age of Onset  . Heart disease Mother   . Hypertension Mother   . Heart attack Mother   . Heart attack Father   . Heart disease Father        Heart disease before age 27  . Cancer Sister   . Deep vein  thrombosis Sister   . Diabetes Sister        Amputation-right leg  . Cancer Daughter   . Diabetes Daughter   . Cancer Brother   . Diabetes Other   . Prostate cancer Other     SOCIAL HISTORY:   Social History   Tobacco Use  . Smoking status: Former Smoker    Types: Cigars    Quit date: 05/27/1988    Years since quitting: 31.0  . Smokeless tobacco: Former Systems developer    Types: [place] date: 05/27/1988  Substance Use Topics  . Alcohol use: No    Alcohol/week: 0.0 standard drinks     ALLERGIES:   No Known Allergies   CURRENT MEDICATIONS:   Current Outpatient Medications  Medication Sig Dispense Refill  . amLODipine (NORVASC) 10 MG tablet Take 1 tablet (10 mg total) by mouth daily. 15 tablet 0  . apixaban (ELIQUIS) 5 MG TABS tablet Take 1 tablet (5 mg total) by mouth 2 (two) times daily. 60 tablet 0  . cetirizine (ZYRTEC) 10 MG tablet Take 10 mg by mouth at bedtime.    Marland Kitchen latanoprost (XALATAN) 0.005 % ophthalmic solution 1 drop at bedtime.    Marland Kitchen lubiprostone (AMITIZA) 24 MCG capsule Take 24 mcg by mouth 2 (two) times daily with a meal.    . metFORMIN (GLUCOPHAGE) 500 MG tablet Take 500 mg by mouth 2 (two) times daily with a meal.      .  metoprolol tartrate (LOPRESSOR) 25 MG tablet TK 1 T PO BID  11  . pravastatin (PRAVACHOL) 40 MG tablet Take 40 mg by mouth daily.       No current facility-administered medications for this visit.    REVIEW OF SYSTEMS:   [X]  denotes positive finding, [ ]  denotes negative finding Cardiac  Comments:  Chest pain or chest pressure:    Shortness of breath upon exertion:    Short of breath when lying flat:    Irregular heart rhythm:        Vascular    Pain in calf, thigh, or hip brought on by ambulation:    Pain in feet at night that wakes you up from your sleep:     Blood clot in your veins:    Leg swelling:         Pulmonary    Oxygen at home:    Productive cough:     Wheezing:         Neurologic    Sudden weakness in arms or legs:      Sudden numbness in arms or legs:     Sudden onset of difficulty speaking or slurred speech:    Temporary loss of vision in one eye:     Problems with dizziness:         Gastrointestinal    Blood in stool:     Vomited blood:         Genitourinary    Burning when urinating:     Blood in urine:        Psychiatric    Major depression:         Hematologic    Bleeding problems:    Problems with blood clotting too easily:        Skin    Rashes or ulcers:        Constitutional    Fever or chills:      PHYSICAL EXAM:   Vitals:   06/14/19 0932  BP: (!) 145/87  Pulse: 100  Resp: 20  Temp: 98 F (36.7 C)  SpO2: 100%  Weight: 215 lb (97.5 kg)  Height: 6\' 1"  (1.854 m)    GENERAL: The patient is a well-nourished male, in no acute distress. The vital signs are documented above. CARDIAC: There is a regular rate and rhythm.  VASCULAR: Bilateral lower extremity edema.  I cannot palpate a left pedal pulse PULMONARY: Non-labored respirations ABDOMEN: Soft and non-tender with normal pitched bowel sounds.  MUSCULOSKELETAL: There are no major deformities or cyanosis. NEUROLOGIC: No focal weakness or paresthesias are detected. SKIN: Gangrenous left fourth toe PSYCHIATRIC: The patient has a normal affect.  STUDIES:   I have ordered and reviewed the following: Left: Graft appears widely patent; however, the inflow artery is difficult to visualize due to edema.  ABI/TBIToday's ABIToday's TBIPrevious ABIPrevious TBI +-------+-----------+-----------+------------+------------+ Right  1.04       0.63       0.80        0.73         +-------+-----------+-----------+------------+------------+ Left   1.21       0.60       0.45        0.00         +-------+-----------+-----------+------------+------------+  Left toe: 88 Right toe: 93 MEDICAL ISSUES:   Atherosclerotic vascular disease with ulceration: The patient recent underwent revascularization and angioplasty  of a high-grade stenosis within his left posterior tibial bypass graft.  This was done for gangrenous  changes to his left fourth toe.  Ultrasound today shows that his bypass graft is now widely patent.  He has adequate toe pressures for wound healing.  I discussed with the patient and his daughter via telephone that amputation of the fourth toe is recommended because of the foul-smelling odor and its gangrenous appearance.  We discussed the possibility that he may have to have packing in order to get this to heal.  I am going to stop his Eliquis.  He will be scheduled for amputation in the immediate future.  This will be done as an outpatient.    Charlena Cross, MD, FACS [facility] [phone] Pager [phone]

## 2019-06-14 NOTE — Progress Notes (Signed)
Spoke with patient's daughter to go for nasal swab after leaving office today and hold ELIQUIS for surgery  starting now. Verbalized understanding.

## 2019-06-15 ENCOUNTER — Telehealth: Payer: Self-pay | Admitting: *Deleted

## 2019-06-15 ENCOUNTER — Other Ambulatory Visit: Payer: Self-pay

## 2019-06-15 ENCOUNTER — Encounter (HOSPITAL_COMMUNITY): Payer: Self-pay | Admitting: Surgery

## 2019-06-15 NOTE — Telephone Encounter (Signed)
Call to daughter in effort to move case up earlier. They can not have patient at the hospital until 9 am. For 11:00 start.

## 2019-06-15 NOTE — Progress Notes (Signed)
Spoke with pt's granddaughter, Warren Mcdonald for pre-op call. Pt has hx of A-fib and is on Eliquis. Pt was instructed by Dr. Myra Gianotti to hold Eliquis as of yesterday. Last dose was 06/14/19. Pt is a type 2 diabetic. [name] states the last A1C done was in June of 2019 and it was 7.8. She states his fasting blood sugar is usually in the 120's to the 130's. Instructed [name] to have pt check his blood sugar in the AM when he gets up and every 2 hours until he leaves for the hospital. If blood sugar is 70 or below, treat with 1/2 cup of clear juice (apple or cranberry) and recheck blood sugar 15 minutes after drinking juice. If blood sugar continues to be 70 or below, call the Short Stay department and ask to speak to a nurse. Instructed [name] pt is not to take his Metformin in the AM. She voiced understanding.  Pt had his Covid test done yesterday and it is negative. [name] states pt has been in quarantine since test was done and understands to stay in quarantine until he leaves for the hospital tomorrow.

## 2019-06-16 ENCOUNTER — Ambulatory Visit (HOSPITAL_COMMUNITY): Payer: Medicare Other | Admitting: Certified Registered Nurse Anesthetist

## 2019-06-16 ENCOUNTER — Other Ambulatory Visit: Payer: Self-pay

## 2019-06-16 ENCOUNTER — Encounter (HOSPITAL_COMMUNITY): Payer: Self-pay | Admitting: Surgery

## 2019-06-16 ENCOUNTER — Encounter (HOSPITAL_COMMUNITY)
Admission: RE | Disposition: A | Discharge status: Home / Self Care | Payer: Self-pay | Source: Home / Self Care | Attending: Surgery

## 2019-06-16 ENCOUNTER — Ambulatory Visit (HOSPITAL_COMMUNITY)
Admission: RE | Admit: 2019-06-16 | Discharge: 2019-06-16 | Disposition: A | Discharge status: Home / Self Care | Payer: Medicare Other | Attending: Surgery | Admitting: Surgery

## 2019-06-16 DIAGNOSIS — Z8711 Personal history of peptic ulcer disease: Secondary | ICD-10-CM | POA: Insufficient documentation

## 2019-06-16 DIAGNOSIS — R000 Tachycardia, unspecified: Secondary | ICD-10-CM | POA: Diagnosis not present

## 2019-06-16 DIAGNOSIS — Z7984 Long term (current) use of oral hypoglycemic drugs: Secondary | ICD-10-CM | POA: Insufficient documentation

## 2019-06-16 DIAGNOSIS — I5033 Acute on chronic diastolic (congestive) heart failure: Secondary | ICD-10-CM | POA: Diagnosis not present

## 2019-06-16 DIAGNOSIS — D649 Anemia, unspecified: Secondary | ICD-10-CM | POA: Diagnosis not present

## 2019-06-16 DIAGNOSIS — Z87891 Personal history of nicotine dependence: Secondary | ICD-10-CM | POA: Diagnosis not present

## 2019-06-16 DIAGNOSIS — I5032 Chronic diastolic (congestive) heart failure: Secondary | ICD-10-CM | POA: Diagnosis not present

## 2019-06-16 DIAGNOSIS — Z79899 Other long term (current) drug therapy: Secondary | ICD-10-CM | POA: Insufficient documentation

## 2019-06-16 DIAGNOSIS — Z7901 Long term (current) use of anticoagulants: Secondary | ICD-10-CM | POA: Insufficient documentation

## 2019-06-16 DIAGNOSIS — Z8042 Family history of malignant neoplasm of prostate: Secondary | ICD-10-CM | POA: Insufficient documentation

## 2019-06-16 DIAGNOSIS — Z809 Family history of malignant neoplasm, unspecified: Secondary | ICD-10-CM | POA: Insufficient documentation

## 2019-06-16 DIAGNOSIS — I96 Gangrene, not elsewhere classified: Secondary | ICD-10-CM | POA: Insufficient documentation

## 2019-06-16 DIAGNOSIS — Z833 Family history of diabetes mellitus: Secondary | ICD-10-CM | POA: Diagnosis not present

## 2019-06-16 DIAGNOSIS — Z8249 Family history of ischemic heart disease and other diseases of the circulatory system: Secondary | ICD-10-CM | POA: Insufficient documentation

## 2019-06-16 DIAGNOSIS — Z9862 Peripheral vascular angioplasty status: Secondary | ICD-10-CM | POA: Diagnosis not present

## 2019-06-16 DIAGNOSIS — E1152 Type 2 diabetes mellitus with diabetic peripheral angiopathy with gangrene: Secondary | ICD-10-CM | POA: Insufficient documentation

## 2019-06-16 DIAGNOSIS — E785 Hyperlipidemia, unspecified: Secondary | ICD-10-CM | POA: Insufficient documentation

## 2019-06-16 DIAGNOSIS — I340 Nonrheumatic mitral (valve) insufficiency: Secondary | ICD-10-CM | POA: Insufficient documentation

## 2019-06-16 DIAGNOSIS — I110 Hypertensive heart disease with heart failure: Secondary | ICD-10-CM | POA: Diagnosis not present

## 2019-06-16 DIAGNOSIS — I4891 Unspecified atrial fibrillation: Secondary | ICD-10-CM | POA: Insufficient documentation

## 2019-06-16 HISTORY — PX: AMPUTATION: SHX166

## 2019-06-16 LAB — GLUCOSE, CAPILLARY
Glucose-Capillary: 133 mg/dL — ABNORMAL HIGH (ref 70–99)
Glucose-Capillary: 168 mg/dL — ABNORMAL HIGH (ref 70–99)

## 2019-06-16 LAB — BASIC METABOLIC PANEL
Anion gap: 10 (ref 5–15)
BUN: 18 mg/dL (ref 8–23)
CO2: 23 mmol/L (ref 22–32)
Calcium: 9.2 mg/dL (ref 8.9–10.3)
Chloride: 103 mmol/L (ref 98–111)
Creatinine, Ser: 1.4 mg/dL — ABNORMAL HIGH (ref 0.61–1.24)
GFR calc Af Amer: 52 mL/min — ABNORMAL LOW (ref >60)
GFR calc non Af Amer: 45 mL/min — ABNORMAL LOW (ref >60)
Glucose, Bld: 162 mg/dL — ABNORMAL HIGH (ref 70–99)
Potassium: 4.3 mmol/L (ref 3.5–5.1)
Sodium: 136 mmol/L (ref 135–145)

## 2019-06-16 LAB — CBC
HCT: 36.9 % — ABNORMAL LOW (ref 39.0–52.0)
Hemoglobin: 12 g/dL — ABNORMAL LOW (ref 13.0–17.0)
MCH: 30.4 pg (ref 26.0–34.0)
MCHC: 32.5 g/dL (ref 30.0–36.0)
MCV: 93.4 fL (ref 80.0–100.0)
Platelets: 248 10*3/uL (ref 150–400)
RBC: 3.95 MIL/uL — ABNORMAL LOW (ref 4.22–5.81)
RDW: 13 % (ref 11.5–15.5)
WBC: 6.4 10*3/uL (ref 4.0–10.5)
nRBC: 0 % (ref 0.0–0.2)

## 2019-06-16 LAB — PROTIME-INR
INR: 1.1 (ref 0.8–1.2)
Prothrombin Time: 14.3 s (ref 11.4–15.2)

## 2019-06-16 SURGERY — AMPUTATION DIGIT
Anesthesia: General | Site: Toe | Laterality: Left

## 2019-06-16 MED ORDER — FENTANYL CITRATE (PF) 100 MCG/2ML IJ SOLN: 25.0000 ug | INTRAMUSCULAR | Status: DC | PRN

## 2019-06-16 MED ORDER — CHLORHEXIDINE GLUCONATE 4 % EX LIQD: 60.0000 mL | Freq: Once | CUTANEOUS | Status: DC

## 2019-06-16 MED ORDER — MIDAZOLAM HCL 2 MG/2ML IJ SOLN
INTRAMUSCULAR | Status: AC
  Filled 2019-06-16: qty 2

## 2019-06-16 MED ORDER — 0.9 % SODIUM CHLORIDE (POUR BTL) OPTIME
TOPICAL | Status: DC | PRN
  Administered 2019-06-16: 1000 mL

## 2019-06-16 MED ORDER — LIDOCAINE HCL (PF) 1 % IJ SOLN
INTRAMUSCULAR | Status: DC | PRN
  Administered 2019-06-16: 9 mL

## 2019-06-16 MED ORDER — ONDANSETRON HCL 4 MG/2ML IJ SOLN: 4.0000 mg | Freq: Once | INTRAMUSCULAR | Status: DC | PRN

## 2019-06-16 MED ORDER — OXYCODONE HCL 5 MG/5ML PO SOLN: 5.0000 mg | Freq: Once | ORAL | Status: DC | PRN

## 2019-06-16 MED ORDER — FENTANYL CITRATE (PF) 250 MCG/5ML IJ SOLN
INTRAMUSCULAR | Status: AC
  Filled 2019-06-16: qty 5

## 2019-06-16 MED ORDER — ONDANSETRON HCL 4 MG/2ML IJ SOLN
INTRAMUSCULAR | Status: DC | PRN
  Administered 2019-06-16: 4 mg via INTRAVENOUS

## 2019-06-16 MED ORDER — OXYCODONE HCL 5 MG PO TABS: 5.0000 mg | ORAL_TABLET | Freq: Once | ORAL | Status: DC | PRN

## 2019-06-16 MED ORDER — PHENYLEPHRINE 40 MCG/ML (10ML) SYRINGE FOR IV PUSH (FOR BLOOD PRESSURE SUPPORT)
PREFILLED_SYRINGE | INTRAVENOUS | Status: DC | PRN
  Administered 2019-06-16 (×2): 120 ug via INTRAVENOUS
  Administered 2019-06-16: 80 ug via INTRAVENOUS

## 2019-06-16 MED ORDER — CEFAZOLIN SODIUM-DEXTROSE 2-4 GM/100ML-% IV SOLN
2.0000 g | INTRAVENOUS | Status: AC
  Administered 2019-06-16: 2 g via INTRAVENOUS
  Filled 2019-06-16: qty 100

## 2019-06-16 MED ORDER — SODIUM CHLORIDE 0.9 % IV SOLN: INTRAVENOUS | Status: DC

## 2019-06-16 MED ORDER — LACTATED RINGERS IV SOLN: INTRAVENOUS | Status: DC

## 2019-06-16 MED ORDER — LIDOCAINE HCL (PF) 1 % IJ SOLN
INTRAMUSCULAR | Status: AC
  Filled 2019-06-16: qty 30

## 2019-06-16 MED ORDER — FENTANYL CITRATE (PF) 250 MCG/5ML IJ SOLN
INTRAMUSCULAR | Status: DC | PRN
  Administered 2019-06-16 (×2): 25 ug via INTRAVENOUS

## 2019-06-16 MED ORDER — PROPOFOL 500 MG/50ML IV EMUL
INTRAVENOUS | Status: DC | PRN
  Administered 2019-06-16: 50 ug/kg/min via INTRAVENOUS

## 2019-06-16 SURGICAL SUPPLY — 33 items
BLADE AVERAGE 25MMX9MM (BLADE) ×1
BLADE AVERAGE 25X9 (BLADE) ×2 IMPLANT
BLADE SAW SGTL 81X20 HD (BLADE) IMPLANT
BNDG ELASTIC 4X5.8 VLCR STR LF (GAUZE/BANDAGES/DRESSINGS) ×3 IMPLANT
BNDG GAUZE ELAST 4 BULKY (GAUZE/BANDAGES/DRESSINGS) ×3 IMPLANT
CANISTER SUCT 3000ML PPV (MISCELLANEOUS) ×3 IMPLANT
COVER SURGICAL LIGHT HANDLE (MISCELLANEOUS) ×3 IMPLANT
COVER WAND RF STERILE (DRAPES) ×3 IMPLANT
DRAPE EXTREMITY T 121X128X90 (DISPOSABLE) ×3 IMPLANT
DRAPE HALF SHEET 40X57 (DRAPES) ×3 IMPLANT
DRSG EMULSION OIL 3X3 NADH (GAUZE/BANDAGES/DRESSINGS) ×2 IMPLANT
ELECT REM PT RETURN 9FT ADLT (ELECTROSURGICAL) ×3
ELECTRODE REM PT RTRN 9FT ADLT (ELECTROSURGICAL) ×1 IMPLANT
GAUZE SPONGE 4X4 12PLY STRL (GAUZE/BANDAGES/DRESSINGS) ×3 IMPLANT
GLOVE BIOGEL PI IND STRL 7.5 (GLOVE) ×1 IMPLANT
GLOVE BIOGEL PI INDICATOR 7.5 (GLOVE) ×2
GLOVE SURG SS PI 7.5 STRL IVOR (GLOVE) ×3 IMPLANT
GOWN STRL REUS W/ TWL LRG LVL3 (GOWN DISPOSABLE) ×2 IMPLANT
GOWN STRL REUS W/ TWL XL LVL3 (GOWN DISPOSABLE) ×1 IMPLANT
GOWN STRL REUS W/TWL LRG LVL3 (GOWN DISPOSABLE) ×4
GOWN STRL REUS W/TWL XL LVL3 (GOWN DISPOSABLE) ×2
KIT BASIN OR (CUSTOM PROCEDURE TRAY) ×3 IMPLANT
KIT TURNOVER KIT B (KITS) ×3 IMPLANT
NDL HYPO 25GX1X1/2 BEV (NEEDLE) IMPLANT
NEEDLE HYPO 25GX1X1/2 BEV (NEEDLE) IMPLANT
NS IRRIG 1000ML POUR BTL (IV SOLUTION) ×3 IMPLANT
PACK GENERAL/GYN (CUSTOM PROCEDURE TRAY) ×3 IMPLANT
PAD ARMBOARD 7.5X6 YLW CONV (MISCELLANEOUS) ×6 IMPLANT
SUT ETHILON 3 0 PS 1 (SUTURE) ×3 IMPLANT
SYR CONTROL 10ML LL (SYRINGE) IMPLANT
TOWEL GREEN STERILE (TOWEL DISPOSABLE) ×6 IMPLANT
UNDERPAD 30X30 (UNDERPADS AND DIAPERS) ×3 IMPLANT
WATER STERILE IRR 1000ML POUR (IV SOLUTION) ×3 IMPLANT

## 2019-06-16 NOTE — Interval H&P Note (Signed)
History and Physical Interval Note:  06/16/2019 8:58 AM  Warren Mcdonald  has presented today for surgery, with the diagnosis of PERIPHERAL VASCULAR DISEASE WITH GANGRENE FOURTH TOE LEFT FOOT.  The various methods of treatment have been discussed with the patient and family. After consideration of risks, benefits and other options for treatment, the patient has consented to  Procedure(s): AMPUTATION DIGIT FOURTH TOE LEFT FOOT (Left) as a surgical intervention.  The patient's history has been reviewed, patient examined, no change in status, stable for surgery.  I have reviewed the patient's chart and labs.  Questions were answered to the patient's satisfaction.     Durene Cal

## 2019-06-16 NOTE — Progress Notes (Signed)
Orthopedic Tech Progress Note Patient Details:  Warren Mcdonald 03/29/1930 [id number]  Ortho Devices Type of Ortho Device: Darco shoe Ortho Device/Splint Location: lle Ortho Device/Splint Interventions: Ordered, Application, Adjustment   Post Interventions Patient Tolerated: Well Instructions Provided: Care of device, Adjustment of device   [name] Post 06/16/2019, 12:46 PM

## 2019-06-16 NOTE — Transfer of Care (Signed)
Immediate Anesthesia Transfer of Care Note  Patient: Warren Mcdonald  Procedure(s) Performed: AMPUTATION DIGIT FOURTH TOE LEFT FOOT (Left Toe)  Patient Location: PACU  Anesthesia Type:MAC  Level of Consciousness: drowsy and patient cooperative  Airway & Oxygen Therapy: Patient Spontanous Breathing and Patient connected to nasal cannula oxygen  Post-op Assessment: Report given to RN, Post -op Vital signs reviewed and stable and Patient moving all extremities X 4  Post vital signs: Reviewed and stable  Last Vitals:  Vitals Value Taken Time  BP 126/70 06/16/19 1045  Temp    Pulse 63 06/16/19 1045  Resp 18 06/16/19 1045  SpO2 100 % 06/16/19 1045  Vitals shown include unvalidated device data.  Last Pain:  Vitals:   06/16/19 0932  TempSrc:   PainSc: 0-No pain         Complications: No apparent anesthesia complications

## 2019-06-16 NOTE — Op Note (Signed)
    Patient name: Warren Mcdonald MRN: [id number] DOB: 11-05-1929 Sex: male  06/16/2019 Pre-operative Diagnosis: Necrotic left fourth toe Post-operative diagnosis:  Same Surgeon:  Annamarie Major Assistants: None Procedure:   #1: Left fourth toe amputation including metatarsal head Anesthesia: MAC with local Blood Loss: Minimal Specimens: Left fourth toe  Findings: Minimal capillary bleeding.  Indications: The patient has developed a necrotic left fourth toe.  He recently underwent angioplasty of a bypass graft stenosis.  He comes in today for amputation  Procedure:  The patient was identified in the holding area and taken to [place]  The patient was then placed supine on the table. MAC anesthesia was administered.  The patient was prepped and draped in the usual sterile fashion.  A time out was called and antibiotics were administered.  1% lidocaine was used for local anesthesia.  I made a racquet type incision around the base of the left fourth toe with a 10 blade.  This was carried down to the bone and bone cutters were used to transect the bone.  Rongeurs were used to remove the metatarsal head.  There was minimal capillary bleeding.  Because of the gap within the skin, I was able to place 2 nylon 3-0 sutures to partially approximate the wound and then wet-to-dry dressing was placed.  The foot was wrapped with sterile dressing.  There were no complications.   Disposition: To PACU stable.   Theotis Burrow, M.D., [facility] [place] Office: [phone] Pager:  [phone]

## 2019-06-16 NOTE — Anesthesia Preprocedure Evaluation (Addendum)
Anesthesia Evaluation  Patient identified by MRN, date of birth, ID band Patient awake    Reviewed: Allergy & Precautions, NPO status , Patient's Chart, lab work & pertinent test results  History of Anesthesia Complications Negative for: history of anesthetic complications  Airway Mallampati: II  TM Distance: >3 FB Neck ROM: Full    Dental  (+) Dental Advisory Given, Missing, Poor Dentition   Pulmonary former smoker,    Pulmonary exam normal        Cardiovascular hypertension, Pt. on medications and Pt. on home beta blockers + Peripheral Vascular Disease  + dysrhythmias Atrial Fibrillation + Valvular Problems/Murmurs  Rhythm:Irregular Rate:Tachycardia + Systolic murmurs  '16 TTE - mild LVH. EF 55% to 60%. Findings consistent with left ventricular diastolic dysfunction, ungraded in the setting of atrial fibrillation. Trivial AI. Loose portion of mitral chordal structure (possible from anteromedial papillary) noted, mild MR. LA and RA were mildly dilated. Moderate TR. PA peak pressure: 41 mm     Neuro/Psych negative neurological ROS  negative psych ROS   GI/Hepatic Neg liver ROS, PUD,   Endo/Other  negative endocrine ROSdiabetes, Type 2, Oral Hypoglycemic Agents  Renal/GU negative Renal ROS     Musculoskeletal negative musculoskeletal ROS (+)   Abdominal   Peds  Hematology  (+) anemia ,   Anesthesia Other Findings Covid neg 1/18  Reproductive/Obstetrics                           Anesthesia Physical Anesthesia Plan  ASA: III  Anesthesia Plan: MAC   Post-op Pain Management:    Induction: Intravenous  PONV Risk Score and Plan: Treatment may vary due to age or medical condition and Propofol infusion  Airway Management Planned: Natural Airway and Simple Face Mask  Additional Equipment: None  Intra-op Plan:   Post-operative Plan:   Informed Consent: I have reviewed the patients  History and Physical, chart, labs and discussed the procedure including the risks, benefits and alternatives for the proposed anesthesia with the patient or authorized representative who has indicated his/her understanding and acceptance.       Plan Discussed with: CRNA and Anesthesiologist  Anesthesia Plan Comments:        Anesthesia Quick Evaluation

## 2019-06-16 NOTE — Anesthesia Postprocedure Evaluation (Signed)
Anesthesia Post Note  Patient: Warren Mcdonald  Procedure(s) Performed: AMPUTATION DIGIT FOURTH TOE LEFT FOOT (Left Toe)     Patient location during evaluation: PACU Anesthesia Type: MAC Level of consciousness: awake and alert Pain management: pain level controlled Vital Signs Assessment: post-procedure vital signs reviewed and stable Respiratory status: spontaneous breathing, nonlabored ventilation and respiratory function stable Cardiovascular status: stable and blood pressure returned to baseline Anesthetic complications: no    Last Vitals:  Vitals:   06/16/19 1045 06/16/19 1100  BP: 126/70 131/65  Pulse: 83 89  Resp: 20 18  Temp: 36.5 C 36.5 C  SpO2: 100% 97%    Last Pain:  Vitals:   06/16/19 1100  TempSrc:   PainSc: 0-No pain                 Audry Pili

## 2019-06-17 ENCOUNTER — Other Ambulatory Visit: Payer: Self-pay

## 2019-06-17 DIAGNOSIS — I70245 Atherosclerosis of native arteries of left leg with ulceration of other part of foot: Secondary | ICD-10-CM

## 2019-06-18 DIAGNOSIS — I70292 Other atherosclerosis of native arteries of extremities, left leg: Secondary | ICD-10-CM | POA: Diagnosis not present

## 2019-06-18 DIAGNOSIS — Z89422 Acquired absence of other left toe(s): Secondary | ICD-10-CM | POA: Diagnosis not present

## 2019-06-18 DIAGNOSIS — M62562 Muscle wasting and atrophy, not elsewhere classified, left lower leg: Secondary | ICD-10-CM | POA: Diagnosis not present

## 2019-06-18 DIAGNOSIS — E1151 Type 2 diabetes mellitus with diabetic peripheral angiopathy without gangrene: Secondary | ICD-10-CM | POA: Diagnosis not present

## 2019-06-18 DIAGNOSIS — Z7984 Long term (current) use of oral hypoglycemic drugs: Secondary | ICD-10-CM | POA: Diagnosis not present

## 2019-06-18 DIAGNOSIS — I5032 Chronic diastolic (congestive) heart failure: Secondary | ICD-10-CM | POA: Diagnosis not present

## 2019-06-18 DIAGNOSIS — Z741 Need for assistance with personal care: Secondary | ICD-10-CM | POA: Diagnosis not present

## 2019-06-18 DIAGNOSIS — Z4781 Encounter for orthopedic aftercare following surgical amputation: Secondary | ICD-10-CM | POA: Diagnosis not present

## 2019-06-18 DIAGNOSIS — I4891 Unspecified atrial fibrillation: Secondary | ICD-10-CM | POA: Diagnosis not present

## 2019-06-18 DIAGNOSIS — I110 Hypertensive heart disease with heart failure: Secondary | ICD-10-CM | POA: Diagnosis not present

## 2019-06-21 ENCOUNTER — Encounter (HOSPITAL_COMMUNITY): Payer: Medicare Other

## 2019-06-21 DIAGNOSIS — E1151 Type 2 diabetes mellitus with diabetic peripheral angiopathy without gangrene: Secondary | ICD-10-CM | POA: Diagnosis not present

## 2019-06-21 DIAGNOSIS — Z4781 Encounter for orthopedic aftercare following surgical amputation: Secondary | ICD-10-CM | POA: Diagnosis not present

## 2019-06-21 DIAGNOSIS — I110 Hypertensive heart disease with heart failure: Secondary | ICD-10-CM | POA: Diagnosis not present

## 2019-06-21 DIAGNOSIS — Z89422 Acquired absence of other left toe(s): Secondary | ICD-10-CM | POA: Diagnosis not present

## 2019-06-21 DIAGNOSIS — I5032 Chronic diastolic (congestive) heart failure: Secondary | ICD-10-CM | POA: Diagnosis not present

## 2019-06-21 DIAGNOSIS — I70292 Other atherosclerosis of native arteries of extremities, left leg: Secondary | ICD-10-CM | POA: Diagnosis not present

## 2019-06-22 ENCOUNTER — Other Ambulatory Visit: Payer: Self-pay | Admitting: *Deleted

## 2019-06-22 ENCOUNTER — Telehealth: Payer: Self-pay | Admitting: *Deleted

## 2019-06-22 DIAGNOSIS — E1151 Type 2 diabetes mellitus with diabetic peripheral angiopathy without gangrene: Secondary | ICD-10-CM | POA: Diagnosis not present

## 2019-06-22 DIAGNOSIS — Z89422 Acquired absence of other left toe(s): Secondary | ICD-10-CM | POA: Diagnosis not present

## 2019-06-22 DIAGNOSIS — I5032 Chronic diastolic (congestive) heart failure: Secondary | ICD-10-CM | POA: Diagnosis not present

## 2019-06-22 DIAGNOSIS — Z4781 Encounter for orthopedic aftercare following surgical amputation: Secondary | ICD-10-CM | POA: Diagnosis not present

## 2019-06-22 DIAGNOSIS — I70292 Other atherosclerosis of native arteries of extremities, left leg: Secondary | ICD-10-CM | POA: Diagnosis not present

## 2019-06-22 DIAGNOSIS — I110 Hypertensive heart disease with heart failure: Secondary | ICD-10-CM | POA: Diagnosis not present

## 2019-06-22 MED ORDER — AMLODIPINE BESYLATE 10 MG PO TABS: 10.0000 mg | ORAL_TABLET | Freq: Every day | ORAL | 0 refills | Status: DC

## 2019-06-22 NOTE — Telephone Encounter (Signed)
Call from grand daughter [name]. She saw a "maggot" on patient's wound at dressing change today. Would antibiotic be needed? Dr. Myra Gianotti instructed to have them clean the wound with soap and water and continue with wet to dry dressings.

## 2019-06-23 ENCOUNTER — Telehealth: Payer: Self-pay | Admitting: *Deleted

## 2019-06-23 ENCOUNTER — Encounter (HOSPITAL_COMMUNITY): Payer: Medicare Other

## 2019-06-23 DIAGNOSIS — I110 Hypertensive heart disease with heart failure: Secondary | ICD-10-CM | POA: Diagnosis not present

## 2019-06-23 DIAGNOSIS — Z89422 Acquired absence of other left toe(s): Secondary | ICD-10-CM | POA: Diagnosis not present

## 2019-06-23 DIAGNOSIS — I70292 Other atherosclerosis of native arteries of extremities, left leg: Secondary | ICD-10-CM | POA: Diagnosis not present

## 2019-06-23 DIAGNOSIS — Z4781 Encounter for orthopedic aftercare following surgical amputation: Secondary | ICD-10-CM | POA: Diagnosis not present

## 2019-06-23 DIAGNOSIS — I5032 Chronic diastolic (congestive) heart failure: Secondary | ICD-10-CM | POA: Diagnosis not present

## 2019-06-23 DIAGNOSIS — E1151 Type 2 diabetes mellitus with diabetic peripheral angiopathy without gangrene: Secondary | ICD-10-CM | POA: Diagnosis not present

## 2019-06-23 NOTE — Telephone Encounter (Signed)
Call from home health nurse. Follow up to let this office know of grand daughters claim she found a maggot in wound yesterday. Nurse states no finding on the days she did dressing changes and house is clean. Nurse  states small amount of drainage and little odor today. She will assess wound on Friday and call this office if patient needs to be seen earlier than 07/05/19 appt.

## 2019-06-25 ENCOUNTER — Other Ambulatory Visit: Payer: Self-pay

## 2019-06-25 ENCOUNTER — Inpatient Hospital Stay (HOSPITAL_COMMUNITY)
Admission: EM | Admit: 2019-06-25 | Discharge: 2019-07-09 | DRG: 475 | Disposition: A | Discharge status: Home Health | Payer: Medicare Other | Attending: Surgery | Admitting: Surgery

## 2019-06-25 DIAGNOSIS — I70292 Other atherosclerosis of native arteries of extremities, left leg: Secondary | ICD-10-CM | POA: Diagnosis not present

## 2019-06-25 DIAGNOSIS — Z79899 Other long term (current) drug therapy: Secondary | ICD-10-CM

## 2019-06-25 DIAGNOSIS — E785 Hyperlipidemia, unspecified: Secondary | ICD-10-CM | POA: Diagnosis present

## 2019-06-25 DIAGNOSIS — Z833 Family history of diabetes mellitus: Secondary | ICD-10-CM

## 2019-06-25 DIAGNOSIS — I083 Combined rheumatic disorders of mitral, aortic and tricuspid valves: Secondary | ICD-10-CM | POA: Diagnosis present

## 2019-06-25 DIAGNOSIS — T8744 Infection of amputation stump, left lower extremity: Principal | ICD-10-CM | POA: Diagnosis present

## 2019-06-25 DIAGNOSIS — I110 Hypertensive heart disease with heart failure: Secondary | ICD-10-CM | POA: Diagnosis not present

## 2019-06-25 DIAGNOSIS — I472 Ventricular tachycardia: Secondary | ICD-10-CM | POA: Diagnosis not present

## 2019-06-25 DIAGNOSIS — Z79891 Long term (current) use of opiate analgesic: Secondary | ICD-10-CM

## 2019-06-25 DIAGNOSIS — Z87891 Personal history of nicotine dependence: Secondary | ICD-10-CM

## 2019-06-25 DIAGNOSIS — I5032 Chronic diastolic (congestive) heart failure: Secondary | ICD-10-CM | POA: Diagnosis present

## 2019-06-25 DIAGNOSIS — T8189XA Other complications of procedures, not elsewhere classified, initial encounter: Secondary | ICD-10-CM | POA: Diagnosis present

## 2019-06-25 DIAGNOSIS — Z4781 Encounter for orthopedic aftercare following surgical amputation: Secondary | ICD-10-CM | POA: Diagnosis not present

## 2019-06-25 DIAGNOSIS — Z89422 Acquired absence of other left toe(s): Secondary | ICD-10-CM | POA: Diagnosis not present

## 2019-06-25 DIAGNOSIS — Z20822 Contact with and (suspected) exposure to covid-19: Secondary | ICD-10-CM | POA: Diagnosis not present

## 2019-06-25 DIAGNOSIS — Z8249 Family history of ischemic heart disease and other diseases of the circulatory system: Secondary | ICD-10-CM

## 2019-06-25 DIAGNOSIS — L819 Disorder of pigmentation, unspecified: Secondary | ICD-10-CM | POA: Diagnosis not present

## 2019-06-25 DIAGNOSIS — Z9049 Acquired absence of other specified parts of digestive tract: Secondary | ICD-10-CM

## 2019-06-25 DIAGNOSIS — Z8711 Personal history of peptic ulcer disease: Secondary | ICD-10-CM

## 2019-06-25 DIAGNOSIS — Z7901 Long term (current) use of anticoagulants: Secondary | ICD-10-CM

## 2019-06-25 DIAGNOSIS — I493 Ventricular premature depolarization: Secondary | ICD-10-CM | POA: Diagnosis not present

## 2019-06-25 DIAGNOSIS — E1152 Type 2 diabetes mellitus with diabetic peripheral angiopathy with gangrene: Secondary | ICD-10-CM | POA: Diagnosis present

## 2019-06-25 DIAGNOSIS — E876 Hypokalemia: Secondary | ICD-10-CM | POA: Diagnosis present

## 2019-06-25 DIAGNOSIS — I739 Peripheral vascular disease, unspecified: Secondary | ICD-10-CM

## 2019-06-25 DIAGNOSIS — Z7982 Long term (current) use of aspirin: Secondary | ICD-10-CM

## 2019-06-25 DIAGNOSIS — Z7984 Long term (current) use of oral hypoglycemic drugs: Secondary | ICD-10-CM

## 2019-06-25 DIAGNOSIS — Z03818 Encounter for observation for suspected exposure to other biological agents ruled out: Secondary | ICD-10-CM | POA: Diagnosis not present

## 2019-06-25 DIAGNOSIS — I9581 Postprocedural hypotension: Secondary | ICD-10-CM | POA: Diagnosis not present

## 2019-06-25 DIAGNOSIS — I480 Paroxysmal atrial fibrillation: Secondary | ICD-10-CM | POA: Diagnosis present

## 2019-06-25 DIAGNOSIS — E1151 Type 2 diabetes mellitus with diabetic peripheral angiopathy without gangrene: Secondary | ICD-10-CM | POA: Diagnosis not present

## 2019-06-25 DIAGNOSIS — Y835 Amputation of limb(s) as the cause of abnormal reaction of the patient, or of later complication, without mention of misadventure at the time of the procedure: Secondary | ICD-10-CM | POA: Diagnosis present

## 2019-06-25 HISTORY — DX: Paroxysmal atrial fibrillation: I48.0

## 2019-06-25 LAB — BASIC METABOLIC PANEL
Anion gap: 11 (ref 5–15)
BUN: 18 mg/dL (ref 8–23)
CO2: 25 mmol/L (ref 22–32)
Calcium: 8.9 mg/dL (ref 8.9–10.3)
Chloride: 99 mmol/L (ref 98–111)
Creatinine, Ser: 1.37 mg/dL — ABNORMAL HIGH (ref 0.61–1.24)
GFR calc Af Amer: 53 mL/min — ABNORMAL LOW (ref >60)
GFR calc non Af Amer: 46 mL/min — ABNORMAL LOW (ref >60)
Glucose, Bld: 222 mg/dL — ABNORMAL HIGH (ref 70–99)
Potassium: 3.2 mmol/L — ABNORMAL LOW (ref 3.5–5.1)
Sodium: 135 mmol/L (ref 135–145)

## 2019-06-25 LAB — CBC
HCT: 36.8 % — ABNORMAL LOW (ref 39.0–52.0)
Hemoglobin: 11.9 g/dL — ABNORMAL LOW (ref 13.0–17.0)
MCH: 29.8 pg (ref 26.0–34.0)
MCHC: 32.3 g/dL (ref 30.0–36.0)
MCV: 92.2 fL (ref 80.0–100.0)
Platelets: 326 10*3/uL (ref 150–400)
RBC: 3.99 MIL/uL — ABNORMAL LOW (ref 4.22–5.81)
RDW: 13.1 % (ref 11.5–15.5)
WBC: 10.1 10*3/uL (ref 4.0–10.5)
nRBC: 0 % (ref 0.0–0.2)

## 2019-06-25 LAB — RESPIRATORY PANEL BY RT PCR (FLU A&B, COVID)
Influenza A by PCR: NEGATIVE
Influenza B by PCR: NEGATIVE
SARS Coronavirus 2 by RT PCR: NEGATIVE

## 2019-06-25 MED ORDER — POLYETHYLENE GLYCOL 3350 17 G PO PACK: 17.0000 g | PACK | Freq: Every day | ORAL | Status: DC | PRN

## 2019-06-25 MED ORDER — PHENOL 1.4 % MT LIQD: 1.0000 | OROMUCOSAL | Status: DC | PRN

## 2019-06-25 MED ORDER — GUAIFENESIN-DM 100-10 MG/5ML PO SYRP: 15.0000 mL | ORAL_SOLUTION | ORAL | Status: DC | PRN

## 2019-06-25 MED ORDER — PANTOPRAZOLE SODIUM 40 MG PO TBEC
40.0000 mg | DELAYED_RELEASE_TABLET | Freq: Every day | ORAL | Status: DC
  Administered 2019-06-25: 40 mg via ORAL
  Filled 2019-06-25: qty 1

## 2019-06-25 MED ORDER — OXYCODONE-ACETAMINOPHEN 5-325 MG PO TABS: 1.0000 | ORAL_TABLET | ORAL | Status: DC | PRN

## 2019-06-25 MED ORDER — LABETALOL HCL 5 MG/ML IV SOLN: 10.0000 mg | INTRAVENOUS | Status: DC | PRN

## 2019-06-25 MED ORDER — ONDANSETRON HCL 4 MG/2ML IJ SOLN: 4.0000 mg | Freq: Four times a day (QID) | INTRAMUSCULAR | Status: DC | PRN

## 2019-06-25 MED ORDER — METOPROLOL TARTRATE 5 MG/5ML IV SOLN
2.0000 mg | INTRAVENOUS | Status: AC | PRN
  Administered 2019-06-25 – 2019-06-26 (×2): 2 mg via INTRAVENOUS
  Filled 2019-06-25: qty 5

## 2019-06-25 MED ORDER — ALUM & MAG HYDROXIDE-SIMETH 200-200-20 MG/5ML PO SUSP: 15.0000 mL | ORAL | Status: DC | PRN

## 2019-06-25 MED ORDER — HEPARIN SODIUM (PORCINE) 5000 UNIT/ML IJ SOLN
5000.0000 [IU] | Freq: Three times a day (TID) | INTRAMUSCULAR | Status: DC
  Administered 2019-06-25: 5000 [IU] via SUBCUTANEOUS
  Filled 2019-06-25: qty 1

## 2019-06-25 MED ORDER — ACETAMINOPHEN 650 MG RE SUPP: 325.0000 mg | RECTAL | Status: DC | PRN

## 2019-06-25 MED ORDER — ACETAMINOPHEN 325 MG PO TABS: 325.0000 mg | ORAL_TABLET | ORAL | Status: DC | PRN

## 2019-06-25 MED ORDER — CEFAZOLIN SODIUM-DEXTROSE 2-4 GM/100ML-% IV SOLN
2.0000 g | Freq: Three times a day (TID) | INTRAVENOUS | Status: DC
  Administered 2019-06-25 – 2019-06-26 (×2): 2 g via INTRAVENOUS
  Filled 2019-06-25 (×2): qty 100

## 2019-06-25 MED ORDER — POTASSIUM CHLORIDE CRYS ER 20 MEQ PO TBCR
20.0000 meq | EXTENDED_RELEASE_TABLET | Freq: Once | ORAL | Status: AC
  Administered 2019-06-25: 40 meq via ORAL
  Filled 2019-06-25: qty 2

## 2019-06-25 MED ORDER — HYDRALAZINE HCL 20 MG/ML IJ SOLN: 5.0000 mg | INTRAMUSCULAR | Status: DC | PRN

## 2019-06-25 NOTE — Progress Notes (Signed)
Noted orders given for sustained elevated pulse: to give Lopressor IV 2-5 mg, see Ordered parameters.  Lopressor 2 mg given, see mar.   Tele monitor orders maintained and observation continued.

## 2019-06-25 NOTE — ED Triage Notes (Signed)
Came in POV. Pt reported advised by wound care nurse to come and be evaluated. S/P toe amputation last Wednesday. Pt denies fever at home and pain when asked.

## 2019-06-25 NOTE — ED Provider Notes (Signed)
[facility] EMERGENCY DEPARTMENT Provider Note   CSN: [id number] Arrival date & time: 06/25/19  1125     History Chief Complaint  Patient presents with  . Post-op Problem    Warren Mcdonald is a 86 y.o. male w/ left fourth toe amputation on 06/15/18 with Dr Trula Slade presenting back to ED with concerns for wound infection.  Patient has no complaints, denies fevers, chills.  His granddaughter Warren Mcdonald tells me by phone that her concerns were that the wound nurse today told her the wound "smelled bad" and had "green discharge."  She states they called the Vascular Surgeon's office and were told by a nurse there to come to the ER for evaluation.   HPI     Past Medical History:  Diagnosis Date  . Abdominal distension (gaseous)   . Anemia, unspecified   . Anemia, unspecified   . Chronic diastolic (congestive) heart failure ([place])   . Chronic diastolic (congestive) heart failure ([place])   . Essential (primary) hypertension   . Essential hypertension   . Hyperlipidemia   . Hyperlipidemia, unspecified   . Peripheral vascular disease ([place]) 2011   Left above-knee popliteal to posterior tibial artery bypass   . Peripheral vascular disease, unspecified ([place])   . PUD (peptic ulcer disease)   . Type 2 diabetes mellitus ([place])   . Type 2 diabetes mellitus with diabetic peripheral angiopathy without gangrene ([place])   . Type 2 diabetes mellitus with diabetic peripheral angiopathy without gangrene ([place])   . Type 2 diabetes mellitus without complications ([place])   . Unspecified atrial fibrillation [facility])     Patient Active Problem List   Diagnosis Date Noted  . Nonhealing surgical wound 06/25/2019  . Essential (primary) hypertension   . Hyperlipidemia, unspecified   . Type 2 diabetes mellitus with diabetic peripheral angiopathy without gangrene ([place])   . Chronic diastolic (congestive) heart failure ([place])   . Acute diastolic heart failure ([place]) 09/12/2014  .  Cholecystitis, acute 09/06/2014  . Atrial fibrillation with rapid ventricular response ([place]) 09/06/2014  . 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 Abdominal pain 09/02/2014  . DM (diabetes mellitus) ([place]) 09/02/2014  . Pain in limb 12/27/2013  . Aftercare following surgery of the circulatory system, [place] 06/28/2013  . Atherosclerosis of native arteries of the extremities with intermittent claudication 06/28/2013  . Peripheral vascular disease ([place]) 12/28/2012  . PAD (peripheral artery disease) ([place]) 12/23/2011    Past Surgical History:  Procedure Laterality Date  . ABDOMINAL AORTOGRAM N/A 05/24/2019   Procedure: ABDOMINAL AORTOGRAM;  Surgeon: Waynetta Sandy, MD;  Location: [place] CV LAB;  Service: Cardiovascular;  Laterality: N/A;  . AMPUTATION Left 06/16/2019   Procedure: AMPUTATION DIGIT FOURTH TOE LEFT FOOT;  Surgeon: Serafina Mitchell, MD;  Location: [facility];  Service: Vascular;  Laterality: Left;  . APPENDECTOMY    . CATARACT EXTRACTION W/ INTRAOCULAR LENS  IMPLANT, BILATERAL    . CHOLECYSTECTOMY N/A 09/08/2014   Procedure: CHOLECYSTECTOMY;  Surgeon: Aviva Signs Md, MD;  Location: [facility];  Service: General;  Laterality: N/A;  . EYE SURGERY    . LOWER EXTREMITY ANGIOGRAPHY Left 05/24/2019   Procedure: Lower Extremity Angiography;  Surgeon: Waynetta Sandy, MD;  Location: [facility] CV LAB;  Service: Cardiovascular;  Laterality: Left;  . PERIPHERAL VASCULAR BALLOON ANGIOPLASTY Left 05/24/2019   Procedure: PERIPHERAL VASCULAR BALLOON ANGIOPLASTY;  Surgeon: Waynetta Sandy, MD;  Location: [facility] CV LAB;  Service: Cardiovascular;  Laterality: Left;  tibial bypass  .  PR VEIN BYPASS GRAFT,AORTO-FEM-POP     Left above knee popliteal to posterior tibial artery bypass graft       Family History  Problem Relation Age of Onset  . Heart disease Mother   . Hypertension Mother   . Heart attack Mother   . Heart attack Father   . Heart disease Father         Heart disease before age 38  . Cancer Sister   . Deep vein thrombosis Sister   . Diabetes Sister        Amputation-right leg  . Cancer Daughter   . Diabetes Daughter   . Cancer Brother   . Diabetes Other   . Prostate cancer Other     Social History   Tobacco Use  . Smoking status: Former Smoker    Types: Cigars    Quit date: 05/27/1988    Years since quitting: 31.0  . Smokeless tobacco: Former Neurosurgeon    Types: Chew    Quit date: 05/27/1988  Substance Use Topics  . Alcohol use: No    Alcohol/week: 0.0 standard drinks  . Drug use: No    Home Medications Prior to Admission medications   Medication Sig Start Date End Date Taking? Authorizing Provider  amLODipine (NORVASC) 10 MG tablet Take 1 tablet (10 mg total) by mouth daily. 06/22/19  Yes Branch, Dorothe Pea, MD  apixaban (ELIQUIS) 5 MG TABS tablet Take 1 tablet (5 mg total) by mouth 2 (two) times daily. 03/11/19  Yes BranchDorothe Pea, MD  aspirin EC 81 MG tablet Take 81 mg by mouth daily.   Yes [provider]  bisacodyl (DULCOLAX) 5 MG EC tablet Take 5 mg by mouth daily as needed for moderate constipation.   Yes [provider]  cetirizine (ZYRTEC) 10 MG tablet Take 10 mg by mouth at bedtime. 04/21/19  Yes [provider]  latanoprost (XALATAN) 0.005 % ophthalmic solution 1 drop at bedtime. 05/10/19  Yes [provider]  lubiprostone (AMITIZA) 24 MCG capsule Take 24 mcg by mouth 2 (two) times daily with a meal.   Yes [provider]  metFORMIN (GLUCOPHAGE) 500 MG tablet Take 500 mg by mouth 2 (two) times daily with a meal.     Yes [provider]  metoprolol tartrate (LOPRESSOR) 25 MG tablet Take 25 mg by mouth 2 (two) times daily.  03/31/15  Yes [provider]  traMADol (ULTRAM) 50 MG tablet Take 50 mg by mouth every 6 (six) hours as needed. 06/17/19  Yes [provider]    Allergies    Patient has no known allergies.  Review of Systems   Review of  Systems  Constitutional: Negative for chills and fever.  Gastrointestinal: Negative for nausea and vomiting.  Skin: Positive for wound. Negative for pallor.  Neurological: Negative for weakness and numbness.  All other systems reviewed and are negative.   Physical Exam Updated Vital Signs BP (!) 148/87 (BP Location: Left Arm)   Pulse (!) 59   Temp 97.8 F (36.6 C) (Oral)   Resp 17   SpO2 100%   Physical Exam Vitals and nursing note reviewed.  Constitutional:      Appearance: He is well-developed.  HENT:     Head: Normocephalic and atraumatic.  Eyes:     Conjunctiva/sclera: Conjunctivae normal.  Cardiovascular:     Rate and Rhythm: Normal rate and regular rhythm.     Comments: Diminished left pedal pulse Pulmonary:     Effort: Pulmonary  effort is normal. No respiratory distress.  Musculoskeletal:     Cervical back: Neck supple.     Comments: Left 4th toe amputation as imaged below No purulent drainage Pink granulation tissue visualized Discoloration of 5th left toe  Skin:    General: Skin is warm and dry.  Neurological:     Mental Status: He is alert.         ED Results / Procedures / Treatments   Labs (all labs ordered are listed, but only abnormal results are displayed) Labs Reviewed  RESPIRATORY PANEL BY RT PCR (FLU A&B, COVID)  CBC  BASIC METABOLIC PANEL    EKG None  Radiology No results found.  Procedures Procedures (including critical care time)  Medications Ordered in ED Medications  potassium chloride SA (KLOR-CON) CR tablet 20-40 mEq (has no administration in time range)  ondansetron (ZOFRAN) injection 4 mg (has no administration in time range)  alum & mag hydroxide-simeth (MAALOX/MYLANTA) 200-200-20 MG/5ML suspension 15-30 mL (has no administration in time range)  pantoprazole (PROTONIX) EC tablet 40 mg (has no administration in time range)  labetalol (NORMODYNE) injection 10 mg (has no administration in time range)  hydrALAZINE  (APRESOLINE) injection 5 mg (has no administration in time range)  metoprolol tartrate (LOPRESSOR) injection 2-5 mg (has no administration in time range)  guaiFENesin-dextromethorphan (ROBITUSSIN DM) 100-10 MG/5ML syrup 15 mL (has no administration in time range)  phenol (CHLORASEPTIC) mouth spray 1 spray (has no administration in time range)  heparin injection 5,000 Units (has no administration in time range)  ceFAZolin (ANCEF) IVPB 2g/100 mL premix (has no administration in time range)  oxyCODONE-acetaminophen (PERCOCET/ROXICET) 5-325 MG per tablet 1-2 tablet (has no administration in time range)  acetaminophen (TYLENOL) tablet 325-650 mg (has no administration in time range)    Or  acetaminophen (TYLENOL) suppository 325-650 mg (has no administration in time range)  polyethylene glycol (MIRALAX / GLYCOLAX) packet 17 g (has no administration in time range)    ED Course  I have reviewed the triage vital signs and the nursing notes.  Pertinent labs & imaging results that were available during my care of the patient were reviewed by me and considered in my medical decision making (see chart for details).  86 yo male w/ 4th toe amputation 9 days ago presenting to Ed with family/home nurse concerns for possible wound infection.  Wound does not appear infected to me.  Pink granulation tissue with minimal drainage.   No signs or symptoms of fever, infection, cellulitis.  Wound was being kept packed prior to arrival.   Left 5th toe dusky in appearance.  Will discuss with vascular surgery team  Final Clinical Impression(s) / ED Diagnoses Final diagnoses:  Discoloration of skin of toe    Rx / DC Orders ED Discharge Orders    None       Alitza Cowman, Kermit Balo, MD 06/25/19 1909

## 2019-06-25 NOTE — Anesthesia Preprocedure Evaluation (Addendum)
Anesthesia Evaluation  Patient identified by MRN, date of birth, ID band Patient awake    Reviewed: Allergy & Precautions, H&P , NPO status , Patient's Chart, lab work & pertinent test results, reviewed documented beta blocker date and time   Airway Mallampati: II  TM Distance: >3 FB Neck ROM: Full    Dental no notable dental hx. (+) Edentulous Upper, Partial Lower, Dental Advisory Given   Pulmonary neg pulmonary ROS, former smoker,    Pulmonary exam normal breath sounds clear to auscultation       Cardiovascular hypertension, Pt. on medications and Pt. on home beta blockers + Peripheral Vascular Disease  + dysrhythmias Atrial Fibrillation  Rhythm:Regular Rate:Normal     Neuro/Psych negative neurological ROS  negative psych ROS   GI/Hepatic Neg liver ROS, PUD,   Endo/Other  diabetes, Type 2, Oral Hypoglycemic Agents  Renal/GU negative Renal ROS  negative genitourinary   Musculoskeletal   Abdominal   Peds  Hematology  (+) Blood dyscrasia, anemia ,   Anesthesia Other Findings   Reproductive/Obstetrics negative OB ROS                            Anesthesia Physical Anesthesia Plan  ASA: III  Anesthesia Plan: MAC   Post-op Pain Management:    Induction: Intravenous  PONV Risk Score and Plan: 3 and Ondansetron, Treatment may vary due to age or medical condition and Propofol infusion  Airway Management Planned: Simple Face Mask  Additional Equipment:   Intra-op Plan:   Post-operative Plan:   Informed Consent: I have reviewed the patients History and Physical, chart, labs and discussed the procedure including the risks, benefits and alternatives for the proposed anesthesia with the patient or authorized representative who has indicated his/her understanding and acceptance.     Dental advisory given  Plan Discussed with: CRNA  Anesthesia Plan Comments:        Anesthesia  Quick Evaluation

## 2019-06-25 NOTE — ED Notes (Signed)
Warren Mcdonald [phone]) called/would like to come in to visit once in a room/waiting outside of ED.  Thank you

## 2019-06-25 NOTE — Plan of Care (Signed)
  Problem: Skin Integrity: Goal: Risk for impaired skin integrity will decrease Outcome: Progressing   Problem: Safety: Goal: Ability to remain free from injury will improve Outcome: Progressing   Problem: Pain Managment: Goal: General experience of comfort will improve Outcome: Progressing   Problem: Elimination: Goal: Will not experience complications related to bowel motility Outcome: Progressing   Problem: Clinical Measurements: Goal: Ability to maintain clinical measurements within normal limits will improve Outcome: Progressing   

## 2019-06-25 NOTE — Consult Note (Addendum)
Hospital Consult    Reason for Consult: Nonhealing left fourth toe amputation Requesting Physician: ED MRN #:  [id number]  History of Present Illness: This is a 86 y.o. male with past medical history significant for type 2 diabetes mellitus, hyperlipidemia, hypertension, and PAD.  He underwent balloon angioplasty of left PTA and bypass graft stenosis by Dr. Randie Heinz on 05/24/2019 followed by left fourth toe amputation for gangrene by Dr. Myra Gianotti 06/16/19.  Home wound care nurse suggested patient should present to the emergency department due to nonhealing left fourth toe amputation site.  He denies any pain to amputation site.  Patient states he has been using a Darco shoe when weightbearing on left lower extremity.  He is on aspirin daily.  He is on Eliquis for atrial fibrillation.  Past Medical History:  Diagnosis Date  . Abdominal distension (gaseous)   . Anemia, unspecified   . Anemia, unspecified   . Chronic diastolic (congestive) heart failure (H[facility])   . Chronic diastolic (congestive) heart failure (H[facility])   . Essential (primary) hypertension   . Essential hypertension   . Hyperlipidemia   . Hyperlipidemia, unspecified   . Peripheral vascular disease ([facility]) 2011   Left above-knee popliteal to posterior tibial artery bypass   . Peripheral vascular disease, unspecified ([facility])   . PUD (peptic ulcer disease)   . Type 2 diabetes mellitus (H[facility])   . Type 2 diabetes mellitus with diabetic peripheral angiopathy without gangrene (H[facility])   . Type 2 diabetes mellitus with diabetic peripheral angiopathy without gangrene (H[facility])   . Type 2 diabetes mellitus without complications (H[facility])   . Unspecified atrial fibrillation [facility])     Past Surgical History:  Procedure Laterality Date  . ABDOMINAL AORTOGRAM N/A 05/24/2019   Procedure: ABDOMINAL AORTOGRAM;  Surgeon: Maeola Harman, MD;  Location: [facility] INVASIVE CV LAB;  Service: Cardiovascular;  Laterality: N/A;  . AMPUTATION Left 06/16/2019   Procedure: AMPUTATION DIGIT FOURTH TOE LEFT FOOT;  Surgeon: Nada Libman, MD;  Location: [facility] OR;  Service: Vascular;  Laterality: Left;  . APPENDECTOMY    . CATARACT EXTRACTION W/ INTRAOCULAR LENS  IMPLANT, BILATERAL    . CHOLECYSTECTOMY N/A 09/08/2014   Procedure: CHOLECYSTECTOMY;  Surgeon: Franky Macho Md, MD;  Location: [facility];  Service: General;  Laterality: N/A;  . EYE SURGERY    . LOWER EXTREMITY ANGIOGRAPHY Left 05/24/2019   Procedure: Lower Extremity Angiography;  Surgeon: Maeola Harman, MD;  Location: [facility] INVASIVE CV LAB;  Service: Cardiovascular;  Laterality: Left;  . PERIPHERAL VASCULAR BALLOON ANGIOPLASTY Left 05/24/2019   Procedure: PERIPHERAL VASCULAR BALLOON ANGIOPLASTY;  Surgeon: Maeola Harman, MD;  Location: [facility] INVASIVE CV LAB;  Service: Cardiovascular;  Laterality: Left;  tibial bypass  . PR VEIN BYPASS GRAFT,AORTO-FEM-POP     Left above knee popliteal to posterior tibial artery bypass graft    No Known Allergies  Prior to Admission medications   Medication Sig Start Date End Date Taking? Authorizing Provider  amLODipine (NORVASC) 10 MG tablet Take 1 tablet (10 mg total) by mouth daily. 06/22/19  Yes Branch, Dorothe Pea, MD  apixaban (ELIQUIS) 5 MG TABS tablet Take 1 tablet (5 mg total) by mouth 2 (two) times daily. 03/11/19  Yes BranchDorothe Pea, MD  aspirin EC 81 MG tablet Take 81 mg by mouth daily.   Yes [provider]  bisacodyl (DULCOLAX) 5 MG EC tablet Take 5 mg by mouth daily as needed for moderate constipation.   Yes [provider]  cetirizine (ZYRTEC) 10  MG tablet Take 10 mg by mouth at bedtime. 04/21/19  Yes [provider]  latanoprost (XALATAN) 0.005 % ophthalmic solution 1 drop at bedtime. 05/10/19  Yes [provider]  lubiprostone (AMITIZA) 24 MCG capsule Take 24 mcg by mouth 2 (two) times daily with a meal.   Yes [provider]  metFORMIN (GLUCOPHAGE) 500 MG tablet Take 500 mg by mouth  2 (two) times daily with a meal.     Yes [provider]  metoprolol tartrate (LOPRESSOR) 25 MG tablet Take 25 mg by mouth 2 (two) times daily.  03/31/15  Yes [provider]  traMADol (ULTRAM) 50 MG tablet Take 50 mg by mouth every 6 (six) hours as needed. 06/17/19  Yes [provider]    Social History   Socioeconomic History  . Marital status: Married    Spouse name: Not on file  . Number of children: Not on file  . Years of education: Not on file  . Highest education level: Not on file  Occupational History  . Not on file  Tobacco Use  . Smoking status: Former Smoker    Types: Cigars    Quit date: 05/27/1988    Years since quitting: 31.0  . Smokeless tobacco: Former Systems developer    Types: [place] date: 05/27/1988  Substance and Sexual Activity  . Alcohol use: No    Alcohol/week: 0.0 standard drinks  . Drug use: No  . Sexual activity: Not on file  Other Topics Concern  . Not on file  Social History Narrative  . Not on file   Social Determinants of Health   Financial Resource Strain:   . Difficulty of Paying Living Expenses: Not on file  Food Insecurity:   . Worried About Charity fundraiser in the Last Year: Not on file  . Ran Out of Food in the Last Year: Not on file  Transportation Needs:   . Lack of Transportation (Medical): Not on file  . Lack of Transportation (Non-Medical): Not on file  Physical Activity:   . Days of Exercise per Week: Not on file  . Minutes of Exercise per Session: Not on file  Stress:   . Feeling of Stress : Not on file  Social Connections:   . Frequency of Communication with Friends and Family: Not on file  . Frequency of Social Gatherings with Friends and Family: Not on file  . Attends Religious Services: Not on file  . Active Member of Clubs or Organizations: Not on file  . Attends Archivist Meetings: Not on file  . Marital Status: Not on file  Intimate Partner Violence:   . Fear of Current or  Ex-Partner: Not on file  . Emotionally Abused: Not on file  . Physically Abused: Not on file  . Sexually Abused: Not on file     Family History  Problem Relation Age of Onset  . Heart disease Mother   . Hypertension Mother   . Heart attack Mother   . Heart attack Father   . Heart disease Father        Heart disease before age 29  . Cancer Sister   . Deep vein thrombosis Sister   . Diabetes Sister        Amputation-right leg  . Cancer Daughter   . Diabetes Daughter   . Cancer Brother   . Diabetes Other   . Prostate cancer Other     ROS: Otherwise negative unless mentioned in  HPI  Physical Examination  Vitals:   06/25/19 1300 06/25/19 1315  BP: 123/68 121/65  Pulse: (!) 101   Resp: 16   Temp:    SpO2: 98%    There is no height or weight on file to calculate BMI.  General:  WDWN in NAD Gait: Not observed HENT: WNL, normocephalic Pulmonary: normal non-labored breathing Cardiac: irregular Abdomen:  soft, NT/ND, no masses Skin: without rashes Vascular Exam/Pulses: Left PT, AT, peroneal brisk signal by Doppler; soft monophasic left DP by Doppler; malodorous nonhealing left fourth toe amputation site now necrotic left fifth toe Extremities: with ischemic changes, without Gangrene , without cellulitis; with open wounds;  Musculoskeletal: no muscle wasting or atrophy  Neurologic: A&O X 3;  No focal weakness or paresthesias are detected; speech is fluent/normal Psychiatric:  The pt has Normal affect. Lymph:  Unremarkable  CBC    Component Value Date/Time   WBC 6.4 06/16/2019 0857   RBC 3.95 (L) 06/16/2019 0857   HGB 12.0 (L) 06/16/2019 0857   HCT 36.9 (L) 06/16/2019 0857   PLT 248 06/16/2019 0857   MCV 93.4 06/16/2019 0857   MCH 30.4 06/16/2019 0857   MCHC 32.5 06/16/2019 0857   RDW 13.0 06/16/2019 0857   LYMPHSABS 1.2 09/10/2014 0433   MONOABS 0.8 09/10/2014 0433   EOSABS 0.2 09/10/2014 0433   BASOSABS 0.1 09/10/2014 0433    BMET    Component Value  Date/Time   NA 136 06/16/2019 0857   NA 138 11/03/2017 0000   K 4.3 06/16/2019 0857   CL 103 06/16/2019 0857   CO2 23 06/16/2019 0857   GLUCOSE 162 (H) 06/16/2019 0857   BUN 18 06/16/2019 0857   BUN 23 (A) 11/03/2017 0000   CREATININE 1.40 (H) 06/16/2019 0857   CALCIUM 9.2 06/16/2019 0857   GFRNONAA 45 (L) 06/16/2019 0857   GFRAA 52 (L) 06/16/2019 0857    COAGS: Lab Results  Component Value Date   INR 1.1 06/16/2019   INR 1.06 12/28/2009      ASSESSMENT/PLAN: This is a 86 y.o. male status post balloon angioplasty of left lower extremity calf stenosis and posterior tibial artery with subsequent left fourth toe amputation who presents to the emergency department due to nonhealing amputation site  - Nonhealing amputation site despite brisk PT, AT peroneal signal by Doppler to the level of the ankle - Bypass likely patent given the above exam findings - Plan is to admit patient overnight and begin IV antibiotics - We will proceed with debridement of wound with fifth toe amputation and possible third toe amputation tomorrow morning - Covid test will be ordered by the ED physician - Consent - N.p.o. past midnight   Emilie Rutter PA-C [facility] [phone]  I have independently interviewed and examined patient and agree with PA assessment and plan above. Plan as noted above. Should be ok for discharge post op tomorrow.    Mohmmad Saleeby C. Randie Heinz, MD [facility] [place] Office: [phone] Pager: [phone]

## 2019-06-25 NOTE — H&P (Signed)
Hospital Consult    Reason for Consult: Nonhealing left fourth toe amputation Requesting Physician: ED MRN #:  [id number]  History of Present Illness: This is a 86 y.o. male with past medical history significant for type 2 diabetes mellitus, hyperlipidemia, hypertension, and PAD.  He underwent balloon angioplasty of left PTA and bypass graft stenosis by Dr. Randie Heinz on 05/24/2019 followed by left fourth toe amputation for gangrene by Dr. Myra Gianotti 06/16/19.  Home wound care nurse suggested patient should present to the emergency department due to nonhealing left fourth toe amputation site.  He denies any pain to amputation site.  Patient states he has been using a Darco shoe when weightbearing on left lower extremity.  He is on aspirin daily.  He is on Eliquis for atrial fibrillation.  Past Medical History:  Diagnosis Date  . Abdominal distension (gaseous)   . Anemia, unspecified   . Anemia, unspecified   . Chronic diastolic (congestive) heart failure (HCC)   . Chronic diastolic (congestive) heart failure (HCC)   . Essential (primary) hypertension   . Essential hypertension   . Hyperlipidemia   . Hyperlipidemia, unspecified   . Peripheral vascular disease (HCC) 2011   Left above-knee popliteal to posterior tibial artery bypass   . Peripheral vascular disease, unspecified (HCC)   . PUD (peptic ulcer disease)   . Type 2 diabetes mellitus (HCC)   . Type 2 diabetes mellitus with diabetic peripheral angiopathy without gangrene (HCC)   . Type 2 diabetes mellitus with diabetic peripheral angiopathy without gangrene (HCC)   . Type 2 diabetes mellitus without complications (HCC)   . Unspecified atrial fibrillation [facility])     Past Surgical History:  Procedure Laterality Date  . ABDOMINAL AORTOGRAM N/A 05/24/2019   Procedure: ABDOMINAL AORTOGRAM;  Surgeon: Maeola Harman, MD;  Location: [facility] INVASIVE CV LAB;  Service: Cardiovascular;  Laterality: N/A;  . AMPUTATION Left 06/16/2019   Procedure: AMPUTATION DIGIT FOURTH TOE LEFT FOOT;  Surgeon: Nada Libman, MD;  Location: [facility] OR;  Service: Vascular;  Laterality: Left;  . APPENDECTOMY    . CATARACT EXTRACTION W/ INTRAOCULAR LENS  IMPLANT, BILATERAL    . CHOLECYSTECTOMY N/A 09/08/2014   Procedure: CHOLECYSTECTOMY;  Surgeon: Franky Macho Md, MD;  Location: [facility] ORS;  Service: General;  Laterality: N/A;  . EYE SURGERY    . LOWER EXTREMITY ANGIOGRAPHY Left 05/24/2019   Procedure: Lower Extremity Angiography;  Surgeon: Maeola Harman, MD;  Location: [facility] INVASIVE CV LAB;  Service: Cardiovascular;  Laterality: Left;  . PERIPHERAL VASCULAR BALLOON ANGIOPLASTY Left 05/24/2019   Procedure: PERIPHERAL VASCULAR BALLOON ANGIOPLASTY;  Surgeon: Maeola Harman, MD;  Location: [facility] INVASIVE CV LAB;  Service: Cardiovascular;  Laterality: Left;  tibial bypass  . PR VEIN BYPASS GRAFT,AORTO-FEM-POP     Left above knee popliteal to posterior tibial artery bypass graft    No Known Allergies  Prior to Admission medications   Medication Sig Start Date End Date Taking? Authorizing Provider  amLODipine (NORVASC) 10 MG tablet Take 1 tablet (10 mg total) by mouth daily. 06/22/19  Yes Branch, Dorothe Pea, MD  apixaban (ELIQUIS) 5 MG TABS tablet Take 1 tablet (5 mg total) by mouth 2 (two) times daily. 03/11/19  Yes BranchDorothe Pea, MD  aspirin EC 81 MG tablet Take 81 mg by mouth daily.   Yes [provider]  bisacodyl (DULCOLAX) 5 MG EC tablet Take 5 mg by mouth daily as needed for moderate constipation.   Yes [provider]  cetirizine (ZYRTEC) 10  MG tablet Take 10 mg by mouth at bedtime. 04/21/19  Yes [provider]  latanoprost (XALATAN) 0.005 % ophthalmic solution 1 drop at bedtime. 05/10/19  Yes [provider]  lubiprostone (AMITIZA) 24 MCG capsule Take 24 mcg by mouth 2 (two) times daily with a meal.   Yes [provider]  metFORMIN (GLUCOPHAGE) 500 MG tablet Take 500 mg by mouth  2 (two) times daily with a meal.     Yes [provider]  metoprolol tartrate (LOPRESSOR) 25 MG tablet Take 25 mg by mouth 2 (two) times daily.  03/31/15  Yes [provider]  traMADol (ULTRAM) 50 MG tablet Take 50 mg by mouth every 6 (six) hours as needed. 06/17/19  Yes [provider]    Social History   Socioeconomic History  . Marital status: Married    Spouse name: Not on file  . Number of children: Not on file  . Years of education: Not on file  . Highest education level: Not on file  Occupational History  . Not on file  Tobacco Use  . Smoking status: Former Smoker    Types: Cigars    Quit date: 05/27/1988    Years since quitting: 31.0  . Smokeless tobacco: Former Systems developer    Types: [place] date: 05/27/1988  Substance and Sexual Activity  . Alcohol use: No    Alcohol/week: 0.0 standard drinks  . Drug use: No  . Sexual activity: Not on file  Other Topics Concern  . Not on file  Social History Narrative  . Not on file   Social Determinants of Health   Financial Resource Strain:   . Difficulty of Paying Living Expenses: Not on file  Food Insecurity:   . Worried About Charity fundraiser in the Last Year: Not on file  . Ran Out of Food in the Last Year: Not on file  Transportation Needs:   . Lack of Transportation (Medical): Not on file  . Lack of Transportation (Non-Medical): Not on file  Physical Activity:   . Days of Exercise per Week: Not on file  . Minutes of Exercise per Session: Not on file  Stress:   . Feeling of Stress : Not on file  Social Connections:   . Frequency of Communication with Friends and Family: Not on file  . Frequency of Social Gatherings with Friends and Family: Not on file  . Attends Religious Services: Not on file  . Active Member of Clubs or Organizations: Not on file  . Attends Archivist Meetings: Not on file  . Marital Status: Not on file  Intimate Partner Violence:   . Fear of Current or  Ex-Partner: Not on file  . Emotionally Abused: Not on file  . Physically Abused: Not on file  . Sexually Abused: Not on file     Family History  Problem Relation Age of Onset  . Heart disease Mother   . Hypertension Mother   . Heart attack Mother   . Heart attack Father   . Heart disease Father        Heart disease before age 29  . Cancer Sister   . Deep vein thrombosis Sister   . Diabetes Sister        Amputation-right leg  . Cancer Daughter   . Diabetes Daughter   . Cancer Brother   . Diabetes Other   . Prostate cancer Other     ROS: Otherwise negative unless mentioned in  HPI  Physical Examination  Vitals:   06/25/19 1815 06/25/19 1849  BP: 125/75 (!) 148/87  Pulse: 85 (!) 59  Resp: 17 17  Temp:  97.8 F (36.6 C)  SpO2: 100% 100%   There is no height or weight on file to calculate BMI.  General:  WDWN in NAD Gait: Not observed HENT: WNL, normocephalic Pulmonary: normal non-labored breathing Cardiac: irregular Abdomen:  soft, NT/ND, no masses Skin: without rashes Vascular Exam/Pulses: Left PT, AT, peroneal brisk signal by Doppler; soft monophasic left DP by Doppler; malodorous nonhealing left fourth toe amputation site now necrotic left fifth toe Extremities: with ischemic changes, without Gangrene , without cellulitis; with open wounds;  Musculoskeletal: no muscle wasting or atrophy  Neurologic: A&O X 3;  No focal weakness or paresthesias are detected; speech is fluent/normal Psychiatric:  The pt has Normal affect. Lymph:  Unremarkable  CBC    Component Value Date/Time   WBC 6.4 06/16/2019 0857   RBC 3.95 (L) 06/16/2019 0857   HGB 12.0 (L) 06/16/2019 0857   HCT 36.9 (L) 06/16/2019 0857   PLT 248 06/16/2019 0857   MCV 93.4 06/16/2019 0857   MCH 30.4 06/16/2019 0857   MCHC 32.5 06/16/2019 0857   RDW 13.0 06/16/2019 0857   LYMPHSABS 1.2 09/10/2014 0433   MONOABS 0.8 09/10/2014 0433   EOSABS 0.2 09/10/2014 0433   BASOSABS 0.1 09/10/2014 0433     BMET    Component Value Date/Time   NA 136 06/16/2019 0857   NA 138 11/03/2017 0000   K 4.3 06/16/2019 0857   CL 103 06/16/2019 0857   CO2 23 06/16/2019 0857   GLUCOSE 162 (H) 06/16/2019 0857   BUN 18 06/16/2019 0857   BUN 23 (A) 11/03/2017 0000   CREATININE 1.40 (H) 06/16/2019 0857   CALCIUM 9.2 06/16/2019 0857   GFRNONAA 45 (L) 06/16/2019 0857   GFRAA 52 (L) 06/16/2019 0857    COAGS: Lab Results  Component Value Date   INR 1.1 06/16/2019   INR 1.06 12/28/2009      ASSESSMENT/PLAN: This is a 86 y.o. male status post balloon angioplasty of left lower extremity calf stenosis and posterior tibial artery with subsequent left fourth toe amputation who presents to the emergency department due to nonhealing amputation site  - Nonhealing amputation site despite brisk PT, AT peroneal signal by Doppler to the level of the ankle - Bypass likely patent given the above exam findings - Plan is to admit patient overnight and begin IV antibiotics - We will proceed with debridement of wound with fifth toe amputation and possible third toe amputation tomorrow morning - Covid test will be ordered by the ED physician - Consent - N.p.o. past midnight   Dagoberto Ligas PA-C [facility] [phone]  I have independently interviewed and examined patient and agree with PA assessment and plan above. Plan as noted above. Should be ok for discharge post op tomorrow.    Vonette Grosso C. Donzetta Matters, MD [facility] [place] Office: [phone] Pager: [phone]

## 2019-06-26 ENCOUNTER — Other Ambulatory Visit: Payer: Self-pay

## 2019-06-26 ENCOUNTER — Observation Stay (HOSPITAL_COMMUNITY): Payer: Medicare Other | Admitting: Certified Registered Nurse Anesthetist

## 2019-06-26 ENCOUNTER — Encounter (HOSPITAL_COMMUNITY): Payer: Self-pay | Admitting: Vascular Surgery

## 2019-06-26 ENCOUNTER — Encounter (HOSPITAL_COMMUNITY)
Admission: EM | Disposition: A | Discharge status: Home Health | Payer: Self-pay | Source: Home / Self Care | Attending: Surgery

## 2019-06-26 DIAGNOSIS — Z8249 Family history of ischemic heart disease and other diseases of the circulatory system: Secondary | ICD-10-CM | POA: Diagnosis not present

## 2019-06-26 DIAGNOSIS — Z7901 Long term (current) use of anticoagulants: Secondary | ICD-10-CM | POA: Diagnosis not present

## 2019-06-26 DIAGNOSIS — I482 Chronic atrial fibrillation, unspecified: Secondary | ICD-10-CM | POA: Diagnosis not present

## 2019-06-26 DIAGNOSIS — Z7982 Long term (current) use of aspirin: Secondary | ICD-10-CM | POA: Diagnosis not present

## 2019-06-26 DIAGNOSIS — I340 Nonrheumatic mitral (valve) insufficiency: Secondary | ICD-10-CM | POA: Diagnosis not present

## 2019-06-26 DIAGNOSIS — S91302A Unspecified open wound, left foot, initial encounter: Secondary | ICD-10-CM | POA: Diagnosis not present

## 2019-06-26 DIAGNOSIS — I110 Hypertensive heart disease with heart failure: Secondary | ICD-10-CM | POA: Diagnosis present

## 2019-06-26 DIAGNOSIS — I083 Combined rheumatic disorders of mitral, aortic and tricuspid valves: Secondary | ICD-10-CM | POA: Diagnosis present

## 2019-06-26 DIAGNOSIS — I10 Essential (primary) hypertension: Secondary | ICD-10-CM | POA: Diagnosis not present

## 2019-06-26 DIAGNOSIS — I9581 Postprocedural hypotension: Secondary | ICD-10-CM | POA: Diagnosis not present

## 2019-06-26 DIAGNOSIS — I472 Ventricular tachycardia: Secondary | ICD-10-CM | POA: Diagnosis not present

## 2019-06-26 DIAGNOSIS — Z87891 Personal history of nicotine dependence: Secondary | ICD-10-CM | POA: Diagnosis not present

## 2019-06-26 DIAGNOSIS — L97929 Non-pressure chronic ulcer of unspecified part of left lower leg with unspecified severity: Secondary | ICD-10-CM | POA: Diagnosis not present

## 2019-06-26 DIAGNOSIS — E119 Type 2 diabetes mellitus without complications: Secondary | ICD-10-CM | POA: Diagnosis not present

## 2019-06-26 DIAGNOSIS — T8754 Necrosis of amputation stump, left lower extremity: Secondary | ICD-10-CM | POA: Diagnosis not present

## 2019-06-26 DIAGNOSIS — I96 Gangrene, not elsewhere classified: Secondary | ICD-10-CM | POA: Diagnosis not present

## 2019-06-26 DIAGNOSIS — E876 Hypokalemia: Secondary | ICD-10-CM | POA: Diagnosis present

## 2019-06-26 DIAGNOSIS — E1152 Type 2 diabetes mellitus with diabetic peripheral angiopathy with gangrene: Secondary | ICD-10-CM | POA: Diagnosis present

## 2019-06-26 DIAGNOSIS — Z20822 Contact with and (suspected) exposure to covid-19: Secondary | ICD-10-CM | POA: Diagnosis present

## 2019-06-26 DIAGNOSIS — I5033 Acute on chronic diastolic (congestive) heart failure: Secondary | ICD-10-CM | POA: Diagnosis not present

## 2019-06-26 DIAGNOSIS — T8781 Dehiscence of amputation stump: Secondary | ICD-10-CM | POA: Diagnosis not present

## 2019-06-26 DIAGNOSIS — T8744 Infection of amputation stump, left lower extremity: Secondary | ICD-10-CM | POA: Diagnosis present

## 2019-06-26 DIAGNOSIS — I420 Dilated cardiomyopathy: Secondary | ICD-10-CM | POA: Diagnosis not present

## 2019-06-26 DIAGNOSIS — Z79899 Other long term (current) drug therapy: Secondary | ICD-10-CM | POA: Diagnosis not present

## 2019-06-26 DIAGNOSIS — Z79891 Long term (current) use of opiate analgesic: Secondary | ICD-10-CM | POA: Diagnosis not present

## 2019-06-26 DIAGNOSIS — Z833 Family history of diabetes mellitus: Secondary | ICD-10-CM | POA: Diagnosis not present

## 2019-06-26 DIAGNOSIS — Y835 Amputation of limb(s) as the cause of abnormal reaction of the patient, or of later complication, without mention of misadventure at the time of the procedure: Secondary | ICD-10-CM | POA: Diagnosis present

## 2019-06-26 DIAGNOSIS — I70202 Unspecified atherosclerosis of native arteries of extremities, left leg: Secondary | ICD-10-CM | POA: Diagnosis not present

## 2019-06-26 DIAGNOSIS — I480 Paroxysmal atrial fibrillation: Secondary | ICD-10-CM | POA: Diagnosis present

## 2019-06-26 DIAGNOSIS — Z9049 Acquired absence of other specified parts of digestive tract: Secondary | ICD-10-CM | POA: Diagnosis not present

## 2019-06-26 DIAGNOSIS — I493 Ventricular premature depolarization: Secondary | ICD-10-CM | POA: Diagnosis not present

## 2019-06-26 DIAGNOSIS — Z8711 Personal history of peptic ulcer disease: Secondary | ICD-10-CM | POA: Diagnosis not present

## 2019-06-26 DIAGNOSIS — Z7984 Long term (current) use of oral hypoglycemic drugs: Secondary | ICD-10-CM | POA: Diagnosis not present

## 2019-06-26 DIAGNOSIS — I4891 Unspecified atrial fibrillation: Secondary | ICD-10-CM | POA: Diagnosis not present

## 2019-06-26 DIAGNOSIS — I5032 Chronic diastolic (congestive) heart failure: Secondary | ICD-10-CM | POA: Diagnosis present

## 2019-06-26 DIAGNOSIS — D649 Anemia, unspecified: Secondary | ICD-10-CM | POA: Diagnosis not present

## 2019-06-26 DIAGNOSIS — E785 Hyperlipidemia, unspecified: Secondary | ICD-10-CM | POA: Diagnosis present

## 2019-06-26 DIAGNOSIS — I361 Nonrheumatic tricuspid (valve) insufficiency: Secondary | ICD-10-CM | POA: Diagnosis not present

## 2019-06-26 HISTORY — PX: AMPUTATION: SHX166

## 2019-06-26 LAB — GLUCOSE, CAPILLARY
Glucose-Capillary: 209 mg/dL — ABNORMAL HIGH (ref 70–99)
Glucose-Capillary: 190 mg/dL — ABNORMAL HIGH (ref 70–99)
Glucose-Capillary: 203 mg/dL — ABNORMAL HIGH (ref 70–99)
Glucose-Capillary: 229 mg/dL — ABNORMAL HIGH (ref 70–99)
Glucose-Capillary: 189 mg/dL — ABNORMAL HIGH (ref 70–99)
Glucose-Capillary: 179 mg/dL — ABNORMAL HIGH (ref 70–99)

## 2019-06-26 LAB — SURGICAL PCR SCREEN
MRSA, PCR: NEGATIVE
Staphylococcus aureus: NEGATIVE

## 2019-06-26 LAB — HEMOGLOBIN A1C
Hgb A1c MFr Bld: 7.8 % — ABNORMAL HIGH (ref 4.8–5.6)
Mean Plasma Glucose: 177.16 mg/dL

## 2019-06-26 SURGERY — AMPUTATION DIGIT
Anesthesia: General | Laterality: Left

## 2019-06-26 MED ORDER — HYDRALAZINE HCL 20 MG/ML IJ SOLN: 5.0000 mg | INTRAMUSCULAR | Status: DC | PRN

## 2019-06-26 MED ORDER — SODIUM CHLORIDE 0.9 % IV SOLN: INTRAVENOUS | Status: AC

## 2019-06-26 MED ORDER — PHENOL 1.4 % MT LIQD: 1.0000 | OROMUCOSAL | Status: DC | PRN

## 2019-06-26 MED ORDER — MAGNESIUM SULFATE 2 GM/50ML IV SOLN
2.0000 g | Freq: Every day | INTRAVENOUS | Status: AC | PRN
  Administered 2019-07-03: 2 g via INTRAVENOUS
  Filled 2019-06-26 (×2): qty 50

## 2019-06-26 MED ORDER — GUAIFENESIN-DM 100-10 MG/5ML PO SYRP: 15.0000 mL | ORAL_SOLUTION | ORAL | Status: DC | PRN

## 2019-06-26 MED ORDER — CEFAZOLIN SODIUM-DEXTROSE 2-4 GM/100ML-% IV SOLN
2.0000 g | Freq: Three times a day (TID) | INTRAVENOUS | Status: AC
  Administered 2019-06-26 (×2): 2 g via INTRAVENOUS
  Filled 2019-06-26 (×2): qty 100

## 2019-06-26 MED ORDER — METOPROLOL TARTRATE 5 MG/5ML IV SOLN
2.0000 mg | INTRAVENOUS | Status: AC | PRN
  Administered 2019-06-29 – 2019-07-01 (×2): 5 mg via INTRAVENOUS
  Filled 2019-06-26 (×2): qty 5

## 2019-06-26 MED ORDER — ACETAMINOPHEN 325 MG PO TABS
325.0000 mg | ORAL_TABLET | ORAL | Status: DC | PRN
  Administered 2019-06-27 – 2019-07-08 (×3): 650 mg via ORAL
  Filled 2019-06-26 (×3): qty 2

## 2019-06-26 MED ORDER — LIDOCAINE HCL (PF) 1 % IJ SOLN
INTRAMUSCULAR | Status: AC
  Filled 2019-06-26: qty 30

## 2019-06-26 MED ORDER — ACETAMINOPHEN 650 MG RE SUPP: 325.0000 mg | RECTAL | Status: DC | PRN

## 2019-06-26 MED ORDER — ASPIRIN EC 81 MG PO TBEC
81.0000 mg | DELAYED_RELEASE_TABLET | Freq: Every day | ORAL | Status: DC
  Administered 2019-06-26 – 2019-07-08 (×13): 81 mg via ORAL
  Filled 2019-06-26 (×13): qty 1

## 2019-06-26 MED ORDER — PANTOPRAZOLE SODIUM 40 MG PO TBEC
40.0000 mg | DELAYED_RELEASE_TABLET | Freq: Every day | ORAL | Status: DC
  Administered 2019-06-26 – 2019-07-09 (×13): 40 mg via ORAL
  Filled 2019-06-26 (×13): qty 1

## 2019-06-26 MED ORDER — POTASSIUM CHLORIDE CRYS ER 20 MEQ PO TBCR
20.0000 meq | EXTENDED_RELEASE_TABLET | Freq: Every day | ORAL | Status: AC | PRN
  Administered 2019-06-28: 20 meq via ORAL
  Filled 2019-06-26: qty 1

## 2019-06-26 MED ORDER — ALUM & MAG HYDROXIDE-SIMETH 200-200-20 MG/5ML PO SUSP: 15.0000 mL | ORAL | Status: DC | PRN

## 2019-06-26 MED ORDER — POLYETHYLENE GLYCOL 3350 17 G PO PACK
17.0000 g | PACK | Freq: Every day | ORAL | Status: DC | PRN
  Administered 2019-07-01: 17 g via ORAL
  Filled 2019-06-26: qty 1

## 2019-06-26 MED ORDER — 0.9 % SODIUM CHLORIDE (POUR BTL) OPTIME
TOPICAL | Status: DC | PRN
  Administered 2019-06-26: 1000 mL

## 2019-06-26 MED ORDER — ONDANSETRON HCL 4 MG/2ML IJ SOLN: 4.0000 mg | Freq: Four times a day (QID) | INTRAMUSCULAR | Status: DC | PRN

## 2019-06-26 MED ORDER — ACETAMINOPHEN 500 MG PO TABS
1000.0000 mg | ORAL_TABLET | Freq: Once | ORAL | Status: AC
  Administered 2019-06-26: 1000 mg via ORAL
  Filled 2019-06-26: qty 2

## 2019-06-26 MED ORDER — OXYCODONE-ACETAMINOPHEN 5-325 MG PO TABS
1.0000 | ORAL_TABLET | ORAL | Status: DC | PRN
  Administered 2019-06-26 – 2019-07-04 (×6): 2 via ORAL
  Administered 2019-07-05: 1 via ORAL
  Filled 2019-06-26: qty 1
  Filled 2019-06-26 (×6): qty 2

## 2019-06-26 MED ORDER — LIDOCAINE HCL (PF) 1 % IJ SOLN
INTRAMUSCULAR | Status: DC | PRN
  Administered 2019-06-26: 30 mL via INTRADERMAL
  Administered 2019-06-26: 15 mL via INTRADERMAL

## 2019-06-26 MED ORDER — LACTATED RINGERS IV SOLN: INTRAVENOUS | Status: DC

## 2019-06-26 MED ORDER — LABETALOL HCL 5 MG/ML IV SOLN: 10.0000 mg | INTRAVENOUS | Status: DC | PRN

## 2019-06-26 MED ORDER — BISACODYL 5 MG PO TBEC
5.0000 mg | DELAYED_RELEASE_TABLET | Freq: Every day | ORAL | Status: DC | PRN
  Administered 2019-06-30 – 2019-07-01 (×2): 5 mg via ORAL
  Filled 2019-06-26 (×2): qty 1

## 2019-06-26 MED ORDER — FENTANYL CITRATE (PF) 100 MCG/2ML IJ SOLN: 25.0000 ug | INTRAMUSCULAR | Status: DC | PRN

## 2019-06-26 MED ORDER — HEPARIN SODIUM (PORCINE) 5000 UNIT/ML IJ SOLN
5000.0000 [IU] | Freq: Three times a day (TID) | INTRAMUSCULAR | Status: DC
  Administered 2019-06-26 – 2019-07-01 (×17): 5000 [IU] via SUBCUTANEOUS
  Filled 2019-06-26 (×16): qty 1

## 2019-06-26 MED ORDER — PROPOFOL 500 MG/50ML IV EMUL
INTRAVENOUS | Status: DC | PRN
  Administered 2019-06-26: 100 ug/kg/min via INTRAVENOUS

## 2019-06-26 MED ORDER — LORATADINE 10 MG PO TABS
10.0000 mg | ORAL_TABLET | Freq: Every day | ORAL | Status: DC
  Administered 2019-06-26 – 2019-07-09 (×13): 10 mg via ORAL
  Filled 2019-06-26 (×13): qty 1

## 2019-06-26 MED ORDER — INSULIN ASPART 100 UNIT/ML ~~LOC~~ SOLN
0.0000 [IU] | Freq: Three times a day (TID) | SUBCUTANEOUS | Status: DC
  Administered 2019-06-26: 3 [IU] via SUBCUTANEOUS
  Administered 2019-06-26 – 2019-06-27 (×2): 5 [IU] via SUBCUTANEOUS
  Administered 2019-06-27: 2 [IU] via SUBCUTANEOUS
  Administered 2019-06-27: 3 [IU] via SUBCUTANEOUS
  Administered 2019-06-28: 5 [IU] via SUBCUTANEOUS
  Administered 2019-06-28 – 2019-06-29 (×2): 3 [IU] via SUBCUTANEOUS
  Administered 2019-06-29: 5 [IU] via SUBCUTANEOUS
  Administered 2019-06-29 – 2019-07-01 (×4): 3 [IU] via SUBCUTANEOUS
  Administered 2019-07-01 – 2019-07-03 (×5): 5 [IU] via SUBCUTANEOUS
  Administered 2019-07-04 (×3): 3 [IU] via SUBCUTANEOUS
  Administered 2019-07-05: 5 [IU] via SUBCUTANEOUS
  Administered 2019-07-05: 2 [IU] via SUBCUTANEOUS
  Administered 2019-07-05 – 2019-07-06 (×2): 3 [IU] via SUBCUTANEOUS
  Administered 2019-07-06: 5 [IU] via SUBCUTANEOUS
  Administered 2019-07-06: 3 [IU] via SUBCUTANEOUS
  Administered 2019-07-07: 18:00:00 2 [IU] via SUBCUTANEOUS
  Administered 2019-07-07 (×2): 5 [IU] via SUBCUTANEOUS
  Administered 2019-07-08: 17:00:00 3 [IU] via SUBCUTANEOUS
  Administered 2019-07-08: 8 [IU] via SUBCUTANEOUS
  Administered 2019-07-08 – 2019-07-09 (×2): 3 [IU] via SUBCUTANEOUS

## 2019-06-26 MED ORDER — METOPROLOL TARTRATE 25 MG PO TABS
25.0000 mg | ORAL_TABLET | Freq: Two times a day (BID) | ORAL | Status: DC
  Administered 2019-06-26 – 2019-07-03 (×15): 25 mg via ORAL
  Filled 2019-06-26 (×15): qty 1

## 2019-06-26 MED ORDER — MORPHINE SULFATE (PF) 2 MG/ML IV SOLN: 2.0000 mg | INTRAVENOUS | Status: DC | PRN

## 2019-06-26 MED ORDER — DOCUSATE SODIUM 100 MG PO CAPS
100.0000 mg | ORAL_CAPSULE | Freq: Every day | ORAL | Status: DC
  Administered 2019-06-27 – 2019-07-09 (×10): 100 mg via ORAL
  Filled 2019-06-26 (×11): qty 1

## 2019-06-26 MED ORDER — AMLODIPINE BESYLATE 10 MG PO TABS
10.0000 mg | ORAL_TABLET | Freq: Every day | ORAL | Status: DC
  Administered 2019-06-26 – 2019-07-03 (×7): 10 mg via ORAL
  Filled 2019-06-26 (×7): qty 1

## 2019-06-26 SURGICAL SUPPLY — 30 items
BLADE AVERAGE 25MMX9MM (BLADE)
BLADE AVERAGE 25X9 (BLADE) IMPLANT
BLADE SAW SGTL 81X20 HD (BLADE) IMPLANT
BNDG ELASTIC 4X5.8 VLCR STR LF (GAUZE/BANDAGES/DRESSINGS) ×3 IMPLANT
BNDG GAUZE ELAST 4 BULKY (GAUZE/BANDAGES/DRESSINGS) ×3 IMPLANT
CANISTER SUCT 3000ML PPV (MISCELLANEOUS) ×3 IMPLANT
COVER SURGICAL LIGHT HANDLE (MISCELLANEOUS) ×3 IMPLANT
COVER WAND RF STERILE (DRAPES) ×3 IMPLANT
DRAPE EXTREMITY T 121X128X90 (DISPOSABLE) ×3 IMPLANT
DRAPE HALF SHEET 40X57 (DRAPES) ×3 IMPLANT
ELECT REM PT RETURN 9FT ADLT (ELECTROSURGICAL) ×3
ELECTRODE REM PT RTRN 9FT ADLT (ELECTROSURGICAL) ×1 IMPLANT
GAUZE SPONGE 4X4 12PLY STRL (GAUZE/BANDAGES/DRESSINGS) ×3 IMPLANT
GAUZE SPONGE 4X4 16PLY XRAY LF (GAUZE/BANDAGES/DRESSINGS) ×3 IMPLANT
GLOVE BIO SURGEON STRL SZ7.5 (GLOVE) ×3 IMPLANT
GOWN STRL REUS W/ TWL LRG LVL3 (GOWN DISPOSABLE) ×2 IMPLANT
GOWN STRL REUS W/ TWL XL LVL3 (GOWN DISPOSABLE) ×1 IMPLANT
GOWN STRL REUS W/TWL LRG LVL3 (GOWN DISPOSABLE) ×4
GOWN STRL REUS W/TWL XL LVL3 (GOWN DISPOSABLE) ×2
KIT BASIN OR (CUSTOM PROCEDURE TRAY) ×3 IMPLANT
KIT TURNOVER KIT B (KITS) ×3 IMPLANT
NEEDLE HYPO 25GX1X1/2 BEV (NEEDLE) IMPLANT
NS IRRIG 1000ML POUR BTL (IV SOLUTION) ×3 IMPLANT
PACK GENERAL/GYN (CUSTOM PROCEDURE TRAY) ×3 IMPLANT
PAD ARMBOARD 7.5X6 YLW CONV (MISCELLANEOUS) ×6 IMPLANT
SUT ETHILON 3 0 PS 1 (SUTURE) ×3 IMPLANT
SYR CONTROL 10ML LL (SYRINGE) IMPLANT
TOWEL GREEN STERILE (TOWEL DISPOSABLE) ×6 IMPLANT
UNDERPAD 30X30 (UNDERPADS AND DIAPERS) ×3 IMPLANT
WATER STERILE IRR 1000ML POUR (IV SOLUTION) ×3 IMPLANT

## 2019-06-26 NOTE — Transfer of Care (Signed)
Immediate Anesthesia Transfer of Care Note  Patient: Warren Mcdonald  Procedure(s) Performed: AMPUTATION DIGIT, left toe (Left )  Patient Location: PACU  Anesthesia Type:MAC  Level of Consciousness: awake, alert  and oriented  Airway & Oxygen Therapy: Patient Spontanous Breathing  Post-op Assessment: Report given to RN and Post -op Vital signs reviewed and stable  Post vital signs: Reviewed and stable  Last Vitals:  Vitals Value Taken Time  BP 112/67 06/26/19 0818  Temp 98.5   Pulse 81 06/26/19 0819  Resp 24 06/26/19 0819  SpO2 97 % 06/26/19 0819  Vitals shown include unvalidated device data.  Last Pain:  Vitals:   06/25/19 1955  TempSrc:   PainSc: 0-No pain     Glu 219    Complications: No apparent anesthesia complications

## 2019-06-26 NOTE — Op Note (Signed)
    Patient name: Warren Mcdonald MRN: [id number] DOB: 1929/12/07 Sex: male  06/26/2019 Pre-operative Diagnosis: Necrotic left fourth toe amputation site Post-operative diagnosis: Necrotic left fourth toe amputation site with additional necrosis of plantar aspect of foot involving third and fourth metatarsal bones Surgeon:  Eda Paschal. Donzetta Matters, MD Procedure Performed:  Open transmetatarsal amputation of left third, fourth and fifth  Indications: 86 year old male has previous history of popliteal to posterior tibial artery bypass.  This recently was intervened upon endovascularly.  His left fourth toe was subsequently amputated now has failed to heal.  He is indicated for further debridement of the toe possible amputations of the third and fifth toes  Findings: There is necrosis on the plantar aspect of the foot extending all the way back onto the proximal fourth metatarsal.  The third and fifth metatarsal bones were were also involved.  An open amputation was created involving the third, fourth and fifth metatarsal bones.  There was good bleeding in the remaining tissue.  Patient will be high risk for below-knee amputation this does not heal   Procedure:  The patient was identified in the holding area and taken to the operating room where he was placed supine on the table and MAC anesthesia induced.  He was sterilely prepped draped in the left lower extremity usual fashion antibiotics were administered timeout was called.  1% lidocaine was used to anesthetize the involved area.  Elliptical type incision was made around the amputation site in the fifth toe.  I dissected down identified significant necrotic tissue on the plantar aspect of the foot that was deep involving the third toe.  I did extend my incision around the third toe down to the base.  There was some bleeding around the third and fifth toe amputation site this was controlled with cautery.  I transected the third, fourth and fifth metatarsal  bones.  These were smoothed with rasp and rongeur.  I resected all necrotic tissue back to healthy-appearing tissue.  Unfortunate this did track the back onto the fourth metatarsal.  I thoroughly irrigated the wound.  I packed it with wet Kerlix and wrapped the foot with Kerlix and Ace wrap.  He is awake anesthesia and tolerated procedure without immediate complication.  All counts were to completion.  EBL: 25 cc   Grae Cannata C. Donzetta Matters, MD [facility] [place] Office: [phone] Pager: [phone]

## 2019-06-26 NOTE — Progress Notes (Signed)
Report given to OR RN. Vital signs stable at this time. OR Nurse made aware of night shift events: elevated heart rate, Potassium given per MD notes and family updates regarding OR scheduled time at 07:15am.  Helped with transport to OR, telemetry box 5N 12 removed and placed in patient's room. Tele monitor tech Irving Burton made aware.    Medical Surgical Consent and EKG obtained and placed to chart front.  Day shift nurse updated and noted to call Deniece Ree dtr: [phone]  for surgical update and post op info regarding patient.

## 2019-06-26 NOTE — Progress Notes (Signed)
Spoke with Wife: Amier Hoyt c# [phone]. Obtained verbal medical consent witnessed by 2 RN's. Also, son Peyton Najjar updated by conference call.    Per MD order to given Potassium PO, per parameters for creatinine and potassium lab results(see mar)  Noted elevated pulse at 133 sustained, parameters to give IV Lopressor 2-5 mg (see mar).   L Foot with scant amount of serous drainage, foul odor noted.   Bed exit alarm maintained and phone and call light in reach. Pt denies pain at this time.

## 2019-06-26 NOTE — Progress Notes (Signed)
  Progress Note    06/26/2019 6:32 AM Day of Surgery  Subjective:  No overnight issues  Vitals:   06/25/19 1955 06/26/19 0426  BP: (!) 126/92 124/74  Pulse: 90 92  Resp: 14 14  Temp: 98.1 F (36.7 C) 97.7 F (36.5 C)  SpO2: 98% 100%    Physical Exam: aaox3 Non labored respirations Stable left foot wound  CBC    Component Value Date/Time   WBC 10.1 06/25/2019 2005   RBC 3.99 (L) 06/25/2019 2005   HGB 11.9 (L) 06/25/2019 2005   HCT 36.8 (L) 06/25/2019 2005   PLT 326 06/25/2019 2005   MCV 92.2 06/25/2019 2005   MCH 29.8 06/25/2019 2005   MCHC 32.3 06/25/2019 2005   RDW 13.1 06/25/2019 2005   LYMPHSABS 1.2 09/10/2014 0433   MONOABS 0.8 09/10/2014 0433   EOSABS 0.2 09/10/2014 0433   BASOSABS 0.1 09/10/2014 0433    BMET    Component Value Date/Time   NA 135 06/25/2019 2005   NA 138 11/03/2017 0000   K 3.2 (L) 06/25/2019 2005   CL 99 06/25/2019 2005   CO2 25 06/25/2019 2005   GLUCOSE 222 (H) 06/25/2019 2005   BUN 18 06/25/2019 2005   BUN 23 (A) 11/03/2017 0000   CREATININE 1.37 (H) 06/25/2019 2005   CALCIUM 8.9 06/25/2019 2005   GFRNONAA 46 (L) 06/25/2019 2005   GFRAA 53 (L) 06/25/2019 2005    INR    Component Value Date/Time   INR 1.1 06/16/2019 0857     Intake/Output Summary (Last 24 hours) at 06/26/2019 9675 Last data filed at 06/26/2019 0543 Gross per 24 hour  Intake 680 ml  Output 700 ml  Net -20 ml     Assessment/plan:  86 y.o. male is s/p left lower extremity revascularization amputation of left fourth toe.  Now with progressive nonhealing wound.  Plan for amputation likely toes 3 through 5 today in OR.   Glyn Zendejas C. Randie Heinz, MD [facility] [place] Office: [phone] Pager: [phone]  06/26/2019 6:32 AM

## 2019-06-26 NOTE — Anesthesia Postprocedure Evaluation (Signed)
Anesthesia Post Note  Patient: Warren Mcdonald  Procedure(s) Performed: AMPUTATION two DIGITs, left toe (Left )     Patient location during evaluation: PACU Anesthesia Type: General Level of consciousness: awake and alert Pain management: pain level controlled Vital Signs Assessment: post-procedure vital signs reviewed and stable Respiratory status: spontaneous breathing, nonlabored ventilation and respiratory function stable Cardiovascular status: stable and blood pressure returned to baseline Postop Assessment: no apparent nausea or vomiting Anesthetic complications: no    Last Vitals:  Vitals:   06/26/19 0830 06/26/19 0907  BP: 104/66 112/70  Pulse: 89 (!) 112  Resp: 19 18  Temp: 36.9 C 37.1 C  SpO2: 98% 97%    Last Pain:  Vitals:   06/26/19 0907  TempSrc: Oral  PainSc: 0-No pain                 Khristie Sak,W. EDMOND

## 2019-06-27 LAB — CBC
HCT: 35.7 % — ABNORMAL LOW (ref 39.0–52.0)
Hemoglobin: 11.6 g/dL — ABNORMAL LOW (ref 13.0–17.0)
MCH: 29.6 pg (ref 26.0–34.0)
MCHC: 32.5 g/dL (ref 30.0–36.0)
MCV: 91.1 fL (ref 80.0–100.0)
Platelets: 334 10*3/uL (ref 150–400)
RBC: 3.92 MIL/uL — ABNORMAL LOW (ref 4.22–5.81)
RDW: 13.2 % (ref 11.5–15.5)
WBC: 6.2 10*3/uL (ref 4.0–10.5)
nRBC: 0 % (ref 0.0–0.2)

## 2019-06-27 LAB — BASIC METABOLIC PANEL
Anion gap: 11 (ref 5–15)
BUN: 18 mg/dL (ref 8–23)
CO2: 24 mmol/L (ref 22–32)
Calcium: 8.8 mg/dL — ABNORMAL LOW (ref 8.9–10.3)
Chloride: 99 mmol/L (ref 98–111)
Creatinine, Ser: 1.47 mg/dL — ABNORMAL HIGH (ref 0.61–1.24)
GFR calc Af Amer: 49 mL/min — ABNORMAL LOW (ref >60)
GFR calc non Af Amer: 42 mL/min — ABNORMAL LOW (ref >60)
Glucose, Bld: 158 mg/dL — ABNORMAL HIGH (ref 70–99)
Potassium: 3.4 mmol/L — ABNORMAL LOW (ref 3.5–5.1)
Sodium: 134 mmol/L — ABNORMAL LOW (ref 135–145)

## 2019-06-27 LAB — GLUCOSE, CAPILLARY
Glucose-Capillary: 146 mg/dL — ABNORMAL HIGH (ref 70–99)
Glucose-Capillary: 242 mg/dL — ABNORMAL HIGH (ref 70–99)
Glucose-Capillary: 154 mg/dL — ABNORMAL HIGH (ref 70–99)
Glucose-Capillary: 151 mg/dL — ABNORMAL HIGH (ref 70–99)

## 2019-06-27 NOTE — Progress Notes (Signed)
  Progress Note    06/27/2019 10:32 AM 1 Day Post-Op  Subjective: Denies pain left foot  Vitals:   06/27/19 0243 06/27/19 0904  BP: 121/78 109/76  Pulse: 82 85  Resp: 16 16  Temp: 98.3 F (36.8 C) (!) 97.4 F (36.3 C)  SpO2: 99% 98%    Physical Exam: Awake alert oriented Nonlabored aspirations Left foot appears well perfused Incision is healed clean repacked at bedside  CBC    Component Value Date/Time   WBC 6.2 06/27/2019 0625   RBC 3.92 (L) 06/27/2019 0625   HGB 11.6 (L) 06/27/2019 0625   HCT 35.7 (L) 06/27/2019 0625   PLT 334 06/27/2019 0625   MCV 91.1 06/27/2019 0625   MCH 29.6 06/27/2019 0625   MCHC 32.5 06/27/2019 0625   RDW 13.2 06/27/2019 0625   LYMPHSABS 1.2 09/10/2014 0433   MONOABS 0.8 09/10/2014 0433   EOSABS 0.2 09/10/2014 0433   BASOSABS 0.1 09/10/2014 0433    BMET    Component Value Date/Time   NA 134 (L) 06/27/2019 0625   NA 138 11/03/2017 0000   K 3.4 (L) 06/27/2019 0625   CL 99 06/27/2019 0625   CO2 24 06/27/2019 0625   GLUCOSE 158 (H) 06/27/2019 0625   BUN 18 06/27/2019 0625   BUN 23 (A) 11/03/2017 0000   CREATININE 1.47 (H) 06/27/2019 0625   CALCIUM 8.8 (L) 06/27/2019 0625   GFRNONAA 42 (L) 06/27/2019 0625   GFRAA 49 (L) 06/27/2019 0625    INR    Component Value Date/Time   INR 1.1 06/16/2019 0857     Intake/Output Summary (Last 24 hours) at 06/27/2019 1032 Last data filed at 06/26/2019 2300 Gross per 24 hour  Intake 440 ml  Output 700 ml  Net -260 ml     Assessment/plan:  86 y.o. male is readmitted with nonhealing left fourth toe amputation site.  He underwent left third fourth and fifth transmetatarsal toe amputations.  Wound is now open and packed.  Will be high risk for left below-knee amputation but possibly can place wound VAC tomorrow.   Lailah Marcelli C. Randie Heinz, MD [facility] [place] Office: [phone] Pager: [phone]  06/27/2019 10:32 AM

## 2019-06-27 NOTE — Evaluation (Signed)
Physical Therapy Evaluation Patient Details Name: Warren Mcdonald MRN: [id number] DOB: [birth year] Today's Date: 06/27/2019   History of Present Illness  86 year old male s/p non healing wound requiring open transmetatarsal amputation of left third, fourth and fifth PMHx of popliteal to posterior tibial artery bypass, PVD, HTN, T2DM. HE wore a darco show when wtbearing on LLE.  Clinical Impression   Patient is s/p above surgery resulting in functional limitations due to the deficits listed below (see PT Problem List). Comes from home, where he had familiy assist for some ADLs, and walked with a cane; Presents with decr functional mobility, generalized weakness, general unsteadiness with gait; Benefits from RW use for stability;  Patient will benefit from skilled PT to increase their independence and safety with mobility to allow discharge to the venue listed below.       Follow Up Recommendations Home health PT;Other (comment)(HHOT as well -- consider [facility])    Equipment Recommendations  Rolling walker with 5" wheels;3in1 (PT)    Recommendations for Other Services       Precautions / Restrictions Precautions Precautions: Fall Restrictions Weight Bearing Restrictions: No(No WB restrictions in order set)      Mobility  Bed Mobility Overal bed mobility: Needs Assistance Bed Mobility: Supine to Sit     Supine to sit: Min guard     General bed mobility comments: Slow moving, minguard for safety  Transfers Overall transfer level: Needs assistance Equipment used: Rolling walker (2 wheeled) Transfers: Sit to/from Stand Sit to Stand: Mod assist         General transfer comment: Light mod assist to power up to stand from bed; min assist to stand from 3in1 with use of armrests  Ambulation/Gait Ambulation/Gait assistance: Min guard Gait Distance (Feet): 30 Feet(to and from bathroom) Assistive device: Rolling walker (2 wheeled) Gait Pattern/deviations:  Decreased step length - right;Decreased step length - left;Trunk flexed Gait velocity: very slow   General Gait Details: Cues to self-monitor for activity tolerance; Good use of RW to support self -- heavy dependence on RW for steadiness  Stairs            Wheelchair Mobility    Modified Rankin (Stroke Patients Only)       Balance                                             Pertinent Vitals/Pain Pain Assessment: No/denies pain    Home Living Family/patient expects to be discharged to:: Private residence Living Arrangements: Spouse/significant other Available Help at Discharge: Family;Available 24 hours/day(spouse and daughter) Type of Home: House Home Access: Stairs to enter Entrance Stairs-Rails: Doctor, general practice of Steps: 3 Home Layout: One level Home Equipment: Cane - single point;Walker - 4 wheels      Prior Function Level of Independence: Needs assistance   Gait / Transfers Assistance Needed: with cane and recently with Darco shoe  ADL's / Homemaking Assistance Needed: Reports he has assist with donning socks and shoes        Hand Dominance        Extremity/Trunk Assessment   Upper Extremity Assessment Upper Extremity Assessment: Defer to OT evaluation    Lower Extremity Assessment Lower Extremity Assessment: Generalized weakness;LLE deficits/detail LLE Deficits / Details: Surgery site at foot wrapped in ACE wrap; Hip, knee, ankle AROM grossly WFL; opted to wear Darco shoe to  help unweight forefoot with amb       Communication   Communication: Other (comment)(at times difficult to understand)  Cognition Arousal/Alertness: Awake/alert Behavior During Therapy: WFL for tasks assessed/performed Overall Cognitive Status: Within Functional Limits for tasks assessed                                        General Comments General comments (skin integrity, edema, etc.): we discussed the importance  of using the darco shoe to unweigh forefoot and help with healing; also to elelvate L foot when at rest    Exercises     Assessment/Plan    PT Assessment Patient needs continued PT services  PT Problem List Decreased strength;Decreased activity tolerance;Decreased balance;Decreased mobility;Decreased coordination;Decreased knowledge of use of DME;Decreased safety awareness;Decreased knowledge of precautions       PT Treatment Interventions DME instruction;Gait training;Stair training;Functional mobility training;Therapeutic activities;Therapeutic exercise;Balance training;Neuromuscular re-education;Cognitive remediation;Patient/family education    PT Goals (Current goals can be found in the Care Plan section)  Acute Rehab PT Goals Patient Stated Goal: Hopes to be home soon PT Goal Formulation: With patient Time For Goal Achievement: 07/11/19 Potential to Achieve Goals: Good    Frequency Min 3X/week   Barriers to discharge        Co-evaluation               AM-PAC PT "6 Clicks" Mobility  Outcome Measure Help needed turning from your back to your side while in a flat bed without using bedrails?: None Help needed moving from lying on your back to sitting on the side of a flat bed without using bedrails?: A Little Help needed moving to and from a bed to a chair (including a wheelchair)?: A Little Help needed standing up from a chair using your arms (e.g., wheelchair or bedside chair)?: A Little Help needed to walk in hospital room?: A Little Help needed climbing 3-5 steps with a railing? : A Lot 6 Click Score: 18    End of Session Equipment Utilized During Treatment: Gait belt Activity Tolerance: Patient tolerated treatment well Patient left: in chair;with call bell/phone within reach;with chair alarm set Nurse Communication: Mobility status PT Visit Diagnosis: Other abnormalities of gait and mobility (R26.89);Muscle weakness (generalized) (M62.81);Difficulty in walking,  not elsewhere classified (R26.2)    Time: 2952-8413 PT Time Calculation (min) (ACUTE ONLY): 29 min   Charges:   PT Evaluation $PT Eval Moderate Complexity: 1 Mod          Roney [place], [place]  Acute Rehabilitation Services Pager [phone] Office [phone]   Colletta [place] 06/27/2019, 11:35 AM

## 2019-06-27 NOTE — Progress Notes (Signed)
Report given to Cameron, RN, Pt not in distress. Pt transferred to 4E21 with belongings.

## 2019-06-27 NOTE — Evaluation (Signed)
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 HE wore a darco show when wtbearing on LLE.   Clinical Impression   Pt PTA: living at home, reports independence with ADL - famiyl assists with shoes/socks and use of SPC for mobility. Pt currently limited by decreased strength and decreased ability to care for self due to poor balance. Pt standing at sink with partial wt bear on LLE in darco shoe and use of sink for leaning on with hips for stability with no cues for safety required. Pt modA from low bed for sit to stand, but minguardA for sit to stand from [facility]. Pt requiring modA overall for ADL. Pt would benefit from continued OT skilled services for ADL, mobility and safety. OT following acutely.  ** [facility] please!    Follow Up Recommendations  Home health OT;Supervision/Assistance - 24 hour    Equipment Recommendations  3 in 1 bedside commode    Recommendations for Other Services       Precautions / Restrictions Precautions Precautions: Fall Restrictions Weight Bearing Restrictions: No(No WB restrictions in order set)      Mobility Bed Mobility Overal bed mobility: Needs Assistance Bed Mobility: Supine to Sit     Supine to sit: Min guard     General bed mobility comments: Slow moving, minguard for safety  Transfers Overall transfer level: Needs assistance Equipment used: Rolling walker (2 wheeled) Transfers: Sit to/from Stand Sit to Stand: Mod assist         General transfer comment: Light mod assist to power up to stand from bed; min assist to stand from 3in1 with use of armrests    Balance Overall balance assessment: Mild deficits observed, not formally  tested                                         ADL either performed or assessed with clinical judgement   ADL Overall ADL's : Needs assistance/impaired Eating/Feeding: Set up;Sitting   Grooming: Min guard;Standing   Upper Body Bathing: Set up;Sitting   Lower Body Bathing: Moderate assistance;Cueing for safety;Sitting/lateral leans;Sit to/from stand Lower Body Bathing Details (indicate cue type and reason): Unable to let go of RW; could perform some in stiting Upper Body Dressing : Set up;Sitting   Lower Body Dressing: Moderate assistance;Sitting/lateral leans;Sit to/from stand Lower Body Dressing Details (indicate cue type and reason): "my daughter puts my socks on me" Toilet Transfer: Min guard;Ambulation;RW;BSC   Toileting- Clothing Manipulation and Hygiene: Moderate assistance;Cueing for safety;Sitting/lateral lean;Sit to/from stand       Functional mobility during ADLs: Min guard;Rolling walker;Cueing for safety General ADL Comments: pt limited by ability to perform ADL tasks and increased assist with mobility.     Vision Baseline Vision/History: No visual deficits Vision Assessment?: No apparent visual deficits     Perception     Praxis      Pertinent Vitals/Pain Pain Assessment: No/denies pain     Hand Dominance     Extremity/Trunk Assessment Upper Extremity Assessment Upper Extremity Assessment: Generalized weakness   Lower Extremity Assessment Lower Extremity Assessment: LLE deficits/detail;Defer to PT evaluation LLE Deficits / Details: s/p removal of 3rd-5th  toes   Cervical / Trunk Assessment Cervical / Trunk Assessment: Normal   Communication Communication Communication: Other (comment)(at times difficult to understand)   Cognition Arousal/Alertness: Awake/alert Behavior During Therapy: WFL for tasks assessed/performed Overall Cognitive Status: Within Functional Limits for tasks assessed                                      General Comments  use of darco shoe- pt able to unweight on toes and just wt bear on heels; pt in standing at sink putting partial wt bear on LLE without any cues.    Exercises     Shoulder Instructions      Home Living Family/patient expects to be discharged to:: Private residence Living Arrangements: Spouse/significant other Available Help at Discharge: Family;Available 24 hours/day(spouse and daughter) Type of Home: House Home Access: Stairs to enter [employer] of Steps: 3 Entrance Stairs-Rails: Right;Left Home Layout: One level     Bathroom Shower/Tub: Corporate investment banker: Standard     Home Equipment: Cane - single point;Walker - 4 wheels          Prior Functioning/Environment Level of Independence: Needs assistance  Gait / Transfers Assistance Needed: with cane and recently with Darco shoe ADL's / Homemaking Assistance Needed: Reports he has assist with donning socks and shoes            OT Problem List: Decreased activity tolerance;Impaired balance (sitting and/or standing)      OT Treatment/Interventions: Self-care/ADL training;Therapeutic exercise;Energy conservation;DME and/or AE instruction;Balance training    OT Goals(Current goals can be found in the care plan section) Acute Rehab OT Goals Patient Stated Goal: Hopes to be home soon OT Goal Formulation: With patient Time For Goal Achievement: 07/11/19 Potential to Achieve Goals: Good ADL Goals Pt Will Perform Grooming: with supervision;standing Pt Will Perform Toileting - Clothing Manipulation and hygiene: with min guard assist;sit to/from stand Additional ADL Goal #1: pt will increase to minguardA for OOB ADL.  OT Frequency: Min 2X/week   Barriers to D/C:            Co-evaluation              AM-PAC OT "6 Clicks" Daily Activity     Outcome Measure Help from another person eating meals?: None Help from another person taking care of personal  grooming?: A Little Help from another person toileting, which includes using toliet, bedpan, or urinal?: A Little Help from another person bathing (including washing, rinsing, drying)?: A Lot Help from another person to put on and taking off regular upper body clothing?: None Help from another person to put on and taking off regular lower body clothing?: A Lot 6 Click Score: 18   End of Session Equipment Utilized During Treatment: Gait belt;Rolling walker Nurse Communication: Mobility status  Activity Tolerance: Patient tolerated treatment well Patient left: in chair;with call bell/phone within reach;with chair alarm set  OT Visit Diagnosis: Unsteadiness on feet (R26.81);Muscle weakness (generalized) (M62.81)                Time: 9833-8250 OT Time Calculation (min): 32 min Charges:  OT General Charges $OT Visit: 1 Visit OT Evaluation $OT Eval Moderate Complexity: Nardin C OTR/L Acute Rehabilitation Services Pager: [phone] Office: [phone]   [id number] C 06/27/2019, 5:00 PM

## 2019-06-27 NOTE — Plan of Care (Signed)

## 2019-06-27 NOTE — Plan of Care (Signed)
  Problem: Skin Integrity: Goal: Risk for impaired skin integrity will decrease Outcome: Progressing   Problem: Safety: Goal: Ability to remain free from injury will improve Outcome: Progressing   Problem: Pain Managment: Goal: General experience of comfort will improve Outcome: Progressing   Problem: Elimination: Goal: Will not experience complications related to bowel motility Outcome: Progressing   

## 2019-06-28 LAB — BASIC METABOLIC PANEL
Anion gap: 15 (ref 5–15)
BUN: 23 mg/dL (ref 8–23)
CO2: 23 mmol/L (ref 22–32)
Calcium: 9.1 mg/dL (ref 8.9–10.3)
Chloride: 96 mmol/L — ABNORMAL LOW (ref 98–111)
Creatinine, Ser: 1.51 mg/dL — ABNORMAL HIGH (ref 0.61–1.24)
GFR calc Af Amer: 47 mL/min — ABNORMAL LOW (ref >60)
GFR calc non Af Amer: 41 mL/min — ABNORMAL LOW (ref >60)
Glucose, Bld: 160 mg/dL — ABNORMAL HIGH (ref 70–99)
Potassium: 3.5 mmol/L (ref 3.5–5.1)
Sodium: 134 mmol/L — ABNORMAL LOW (ref 135–145)

## 2019-06-28 LAB — CBC
HCT: 36.3 % — ABNORMAL LOW (ref 39.0–52.0)
Hemoglobin: 11.6 g/dL — ABNORMAL LOW (ref 13.0–17.0)
MCH: 29.3 pg (ref 26.0–34.0)
MCHC: 32 g/dL (ref 30.0–36.0)
MCV: 91.7 fL (ref 80.0–100.0)
Platelets: 397 10*3/uL (ref 150–400)
RBC: 3.96 MIL/uL — ABNORMAL LOW (ref 4.22–5.81)
RDW: 13.3 % (ref 11.5–15.5)
WBC: 9.6 10*3/uL (ref 4.0–10.5)
nRBC: 0 % (ref 0.0–0.2)

## 2019-06-28 LAB — GLUCOSE, CAPILLARY
Glucose-Capillary: 141 mg/dL — ABNORMAL HIGH (ref 70–99)
Glucose-Capillary: 189 mg/dL — ABNORMAL HIGH (ref 70–99)
Glucose-Capillary: 216 mg/dL — ABNORMAL HIGH (ref 70–99)
Glucose-Capillary: 245 mg/dL — ABNORMAL HIGH (ref 70–99)

## 2019-06-28 NOTE — Progress Notes (Addendum)
[facility]  Subjective  - No new complaints.  Slept well.   Objective 128/77 95 99 F (37.2 C) (Oral) 17 99%  Intake/Output Summary (Last 24 hours) at 06/28/2019 0748 Last data filed at 06/28/2019 0452 Gross per 24 hour  Intake 360 ml  Output 400 ml  Net -40 ml    Left foot wound with + bleeding on dressing, malodor, no purulence.     Wet to dry dressing applied, patient tolerated.   Assessment/Planning: Left foot open wound with malodor  WBC WNL, afebrile.  He may need empiric antibiotics verses Dakin solution.  We will discuss this with DR. Randie Heinz. Continue wet to dry daily.  Mosetta Pigeon 06/28/2019 7:48 AM --  Laboratory Lab Results: Recent Labs    06/27/19 0625 06/28/19 0441  WBC 6.2 9.6  HGB 11.6* 11.6*  HCT 35.7* 36.3*  PLT 334 397   BMET Recent Labs    06/27/19 0625 06/28/19 0441  NA 134* 134*  K 3.4* 3.5  CL 99 96*  CO2 24 23  GLUCOSE 158* 160*  BUN 18 23  CREATININE 1.47* 1.51*  CALCIUM 8.8* 9.1    COAG Lab Results  Component Value Date   INR 1.1 06/16/2019   INR 1.06 12/28/2009   No results found for: PTT  Agree with the above.  Family at bedside.  Plan for dressing change tomorrow to determine if he needs more debridement in the OR  Wells Michalina Calbert

## 2019-06-28 NOTE — Progress Notes (Signed)
06/28/2019 2:31 PM Again nurse tried to call [name] at [phone] per family request and was sent straight to voice mail.  Will try again later. Kathryne Hitch

## 2019-06-28 NOTE — Progress Notes (Signed)
06/28/2019 12:50 PM Nurse made an attempt to call [name] at [phone].  Phone went straight to voice mail.  Will try again soon. Kathryne Hitch

## 2019-06-28 NOTE — Progress Notes (Addendum)
Spoke with several family members today regarding patient, encouraged family to have a point person for information on patient. They stated that Yunus Stoklosa is the point person phone number in the chart. [phone]  . Salinda Snedeker, Randall An RN

## 2019-06-28 NOTE — Progress Notes (Signed)
Occupational Therapy Treatment Patient Details Name: Warren Mcdonald MRN: [id number] DOB: 06-12-1929 Today's Date: 06/28/2019    History of present illness 86 year old male s/p non healing wound requiring open transmetatarsal amputation of left third, fourth and fifth PMHx of popliteal to posterior tibial artery bypass, PVD, HTN, T2DM. HE wore a darco show when wtbearing on LLE.   OT comments  Pt making gradual progress towards OT goals this session. Pt HR 126 bpm at rest upon OT arrival. Pt reports needing to have BM, d/t increasing HR assisted pt on bed pan needing supervision to roll to R to position bed pan. Pt required total A for posterior pericare. Pt HR increase to 155 bpm during bed mobility. Will continue efforts to progress pt to OOB ADLs next session. DC plan currently remains appropriate.    Follow Up Recommendations  Home health OT;Supervision/Assistance - 24 hour    Equipment Recommendations  3 in 1 bedside commode    Recommendations for Other Services      Precautions / Restrictions Precautions Precautions: Fall Precaution Comments: watch HR Restrictions Weight Bearing Restrictions: No Other Position/Activity Restrictions: No WB restrictions in orders       Mobility Bed Mobility Overal bed mobility: Needs Assistance Bed Mobility: Rolling Rolling: Supervision         General bed mobility comments: able to roll to R with supervision to place bed pan  Transfers                 General transfer comment: unable to transfer d/t HR    Balance                                           ADL either performed or assessed with clinical judgement   ADL Overall ADL's : Needs assistance/impaired             Lower Body Bathing: Total assistance;Bed level             Toilet Transfer Details (indicate cue type and reason): limited to bed pan d/t increasing HR up to 133 at rest, 155 bpm with movement Toileting- Clothing  Manipulation and Hygiene: Bed level;Total assistance Toileting - Clothing Manipulation Details (indicate cue type and reason): to clean up off of bed pan     Functional mobility during ADLs: Min guard(bed mobility only) General ADL Comments: pt increasing HR limiting session, only able to complete bed mobility to get on<>off bed pan     Vision       Perception     Praxis      Cognition Arousal/Alertness: Awake/alert Behavior During Therapy: WFL for tasks assessed/performed Overall Cognitive Status: Within Functional Limits for tasks assessed                                          Exercises     Shoulder Instructions       General Comments pt HR 126 at rest increasing to 155 bpm with bed mobility only    Pertinent Vitals/ Pain       Pain Assessment: No/denies pain  Home Living  Prior Functioning/Environment              Frequency  Min 2X/week        Progress Toward Goals  OT Goals(current goals can now be found in the care plan section)  Progress towards OT goals: Not progressing toward goals - comment(limited by HR this session)  Acute Rehab OT Goals Patient Stated Goal: Hopes to be home soon OT Goal Formulation: With patient Time For Goal Achievement: 07/11/19 Potential to Achieve Goals: Good  Plan Discharge plan remains appropriate    Co-evaluation                 AM-PAC OT "6 Clicks" Daily Activity     Outcome Measure   Help from another person eating meals?: None Help from another person taking care of personal grooming?: A Little Help from another person toileting, which includes using toliet, bedpan, or urinal?: A Lot Help from another person bathing (including washing, rinsing, drying)?: A Lot Help from another person to put on and taking off regular upper body clothing?: None Help from another person to put on and taking off regular lower body clothing?:  A Lot 6 Click Score: 17    End of Session    OT Visit Diagnosis: Unsteadiness on feet (R26.81);Muscle weakness (generalized) (M62.81)   Activity Tolerance Treatment limited secondary to medical complications (Comment);Other (comment)(increasing HR)   Patient Left in bed;with call bell/phone within reach;with bed alarm set;Other (comment)(on bed pan - RN aware)   Nurse Communication Mobility status;Other (comment)(on bed pan, emptied urinal and charted amount, noted to have clear, runny BM)        Time: 8182-9937 OT Time Calculation (min): 16 min  Charges: OT General Charges $OT Visit: 1 Visit OT Treatments $Self Care/Home Management : 8-22 mins  Lanier Clam., COTA/L Acute Rehabilitation Services [phone] [place] 06/28/2019, 12:02 PM

## 2019-06-28 NOTE — Progress Notes (Signed)
Physical Therapy Treatment Patient Details Name: Warren Mcdonald MRN: [id number] DOB: 1930/01/04 Today's Date: 06/28/2019    History of Present Illness 86 year old male s/p non healing wound requiring open transmetatarsal amputation of left third, fourth and fifth PMHx of popliteal to posterior tibial artery bypass, PVD, HTN, T2DM. HE wore a darco show when wtbearing on LLE.    PT Comments    Patient's mobility limited today due to sustained tachycardia. Tolerated getting to/from EOB with supervision for safety. HR ranged from 111-163 bpm mostly sustaining >145 bpm. Pt asymptomatic. Supine BP pre activity 127/74, post activity BP 115/76. RN aware.  Will continue to follow for appropriateness for mobility.    Follow Up Recommendations  Home health PT;Other (comment)(consider [facility])     Equipment Recommendations  Rolling walker with 5" wheels;3in1 (PT)    Recommendations for Other Services       Precautions / Restrictions Precautions Precautions: Fall Precaution Comments: watch HR Restrictions Weight Bearing Restrictions: No Other Position/Activity Restrictions: No WB restrictions in orders    Mobility  Bed Mobility Overal bed mobility: Needs Assistance Bed Mobility: Supine to Sit;Sit to Supine Rolling: Supervision   Supine to sit: Supervision;HOB elevated Sit to supine: Supervision;HOB elevated   General bed mobility comments: Able to get to/from EOB with supervision for safety. Guarded due to LLE.  Transfers                 General transfer comment: Deferred due to sustained tachycardia, with HR up to 163 sitting EOB.  Ambulation/Gait             General Gait Details: Deferred, see above.   Stairs             Wheelchair Mobility    Modified Rankin (Stroke Patients Only)       Balance Overall balance assessment: Needs assistance Sitting-balance support: Feet supported;No upper extremity supported Sitting  balance-Leahy Scale: Good Sitting balance - Comments: total A to donn/doff darco shoe       Standing balance comment: Deferred due to sustained tachycardia in the 160s.                            Cognition Arousal/Alertness: Awake/alert Behavior During Therapy: WFL for tasks assessed/performed Overall Cognitive Status: Within Functional Limits for tasks assessed                                        Exercises General Exercises - Lower Extremity Ankle Circles/Pumps: Both;20 reps;Supine Straight Leg Raises: Left;10 reps;Supine    General Comments General comments (skin integrity, edema, etc.): HH 111-163 bpm just sitting EOB. Supine BP pre activity 127/74, post activity BP 115/76. RN aware.      Pertinent Vitals/Pain Pain Assessment: No/denies pain    Home Living                      Prior Function            PT Goals (current goals can now be found in the care plan section) Acute Rehab PT Goals Patient Stated Goal: Hopes to be home soon Progress towards PT goals: Not progressing toward goals - comment(HR)    Frequency    Min 3X/week      PT Plan Current plan remains appropriate    Co-evaluation  AM-PAC PT "6 Clicks" Mobility   Outcome Measure  Help needed turning from your back to your side while in a flat bed without using bedrails?: None Help needed moving from lying on your back to sitting on the side of a flat bed without using bedrails?: None Help needed moving to and from a bed to a chair (including a wheelchair)?: A Little Help needed standing up from a chair using your arms (e.g., wheelchair or bedside chair)?: A Little Help needed to walk in hospital room?: A Little Help needed climbing 3-5 steps with a railing? : A Lot 6 Click Score: 19    End of Session   Activity Tolerance: Treatment limited secondary to medical complications (Comment)(sustained tachycardia) Patient left: in bed;with call  bell/phone within reach;with bed alarm set Nurse Communication: Mobility status;Other (comment)(HR) PT Visit Diagnosis: Other abnormalities of gait and mobility (R26.89);Muscle weakness (generalized) (M62.81);Difficulty in walking, not elsewhere classified (R26.2)     Time: 8938-1017 PT Time Calculation (min) (ACUTE ONLY): 15 min  Charges:  $Therapeutic Activity: 8-22 mins                     Marisa Severin, PT, DPT Acute Rehabilitation Services Pager [phone] Office [place] 06/28/2019, 12:14 PM

## 2019-06-29 LAB — GLUCOSE, CAPILLARY
Glucose-Capillary: 178 mg/dL — ABNORMAL HIGH (ref 70–99)
Glucose-Capillary: 216 mg/dL — ABNORMAL HIGH (ref 70–99)
Glucose-Capillary: 176 mg/dL — ABNORMAL HIGH (ref 70–99)
Glucose-Capillary: 151 mg/dL — ABNORMAL HIGH (ref 70–99)

## 2019-06-29 MED ORDER — CEFAZOLIN SODIUM-DEXTROSE 2-4 GM/100ML-% IV SOLN
2.0000 g | INTRAVENOUS | Status: AC
  Administered 2019-06-30: 2 g via INTRAVENOUS
  Filled 2019-06-29 (×2): qty 100

## 2019-06-29 MED ORDER — CEFAZOLIN SODIUM-DEXTROSE 1-4 GM/50ML-% IV SOLN
1.0000 g | INTRAVENOUS | Status: DC
  Filled 2019-06-29: qty 50

## 2019-06-29 NOTE — Progress Notes (Addendum)
  Progress Note    06/29/2019 7:34 AM 3 Days Post-Op  Subjective:  Admit for nonhealing left fourth toe amputation.  Now open transmetatarsal amputation of left third, fourth and fifth.   Vitals:   06/28/19 2200 06/29/19 0517  BP:  (!) 141/69  Pulse: 97 (!) 102  Resp:  18  Temp:  99.9 F (37.7 C)  SpO2: 96% 98%    Physical Exam: Cardiac: RRR Lungs:  Non-labored Extremities: Dressing removed from left foot and replaced.  Wound is dry without granulation or bleeding points. Malodorous     CBC    Component Value Date/Time   WBC 9.6 06/28/2019 0441   RBC 3.96 (L) 06/28/2019 0441   HGB 11.6 (L) 06/28/2019 0441   HCT 36.3 (L) 06/28/2019 0441   PLT 397 06/28/2019 0441   MCV 91.7 06/28/2019 0441   MCH 29.3 06/28/2019 0441   MCHC 32.0 06/28/2019 0441   RDW 13.3 06/28/2019 0441   LYMPHSABS 1.2 09/10/2014 0433   MONOABS 0.8 09/10/2014 0433   EOSABS 0.2 09/10/2014 0433   BASOSABS 0.1 09/10/2014 0433    BMET    Component Value Date/Time   NA 134 (L) 06/28/2019 0441   NA 138 11/03/2017 0000   K 3.5 06/28/2019 0441   CL 96 (L) 06/28/2019 0441   CO2 23 06/28/2019 0441   GLUCOSE 160 (H) 06/28/2019 0441   BUN 23 06/28/2019 0441   BUN 23 (A) 11/03/2017 0000   CREATININE 1.51 (H) 06/28/2019 0441   CALCIUM 9.1 06/28/2019 0441   GFRNONAA 41 (L) 06/28/2019 0441   GFRAA 47 (L) 06/28/2019 0441     Intake/Output Summary (Last 24 hours) at 06/29/2019 0734 Last data filed at 06/29/2019 0518 Gross per 24 hour  Intake 410 ml  Output 950 ml  Net -540 ml    HOSPITAL MEDICATIONS Scheduled Meds: . amLODipine  10 mg Oral Daily  . aspirin EC  81 mg Oral Daily  . docusate sodium  100 mg Oral Daily  . heparin  5,000 Units Subcutaneous Q8H  . insulin aspart  0-15 Units Subcutaneous TID WC  . loratadine  10 mg Oral Daily  . metoprolol tartrate  25 mg Oral BID  . pantoprazole  40 mg Oral Daily   Continuous Infusions: . magnesium sulfate bolus IVPB     PRN Meds:.acetaminophen  **OR** acetaminophen, alum & mag hydroxide-simeth, bisacodyl, guaiFENesin-dextromethorphan, hydrALAZINE, labetalol, magnesium sulfate bolus IVPB, metoprolol tartrate, morphine injection, ondansetron, oxyCODONE-acetaminophen, phenol, polyethylene glycol  Assessment:  86 y.o. male is s/p:  revascularization, left 4th toe amp with poor healing now POD 3 open TMA of 3,4, 5.   3 Days Post-Op  Plan: -Dr. Myra Gianotti to see wound today -DVT prophylaxis:  heparin   Wendi Maya, PA-C [facility] [phone] 06/29/2019  7:34 AM   I agree with the above.  Plan for repeat I&D tomorrow  Durene Cal

## 2019-06-29 NOTE — Progress Notes (Signed)
Occupational Therapy Treatment Patient Details Name: Warren Mcdonald MRN: [id number] DOB: 02-Apr-1930 Today's Date: 06/29/2019    History of present illness 86 year old male s/p non healing wound requiring open transmetatarsal amputation of left third, fourth and fifth PMHx of popliteal to posterior tibial artery bypass, PVD, HTN, T2DM. HE wore a darco show when wtbearing on LLE.   OT comments  Pt making steady progress towards OT goals this session. Pt continues to be tachycardic with movement with HR ranging from  120-183 bpm - RN present at end of session and aware. Overall, pt requires MOD A +2 for sit>stand from EOB with RW and MIN A +2 for stand pivot transfer to recliner. Pt completed seated grooming tasks in recliner with supervision, MAX A to don darco shoe. DC plan currently remains appropriate, will follow acutely per POC.    Follow Up Recommendations  Home health OT;Supervision/Assistance - 24 hour    Equipment Recommendations  3 in 1 bedside commode    Recommendations for Other Services      Precautions / Restrictions Precautions Precautions: Fall Precaution Comments: watch HR Restrictions Weight Bearing Restrictions: No Other Position/Activity Restrictions: No WB restrictions in orders       Mobility Bed Mobility Overal bed mobility: Needs Assistance Bed Mobility: Supine to Sit     Supine to sit: HOB elevated;Supervision     General bed mobility comments: Able to get to EOB with use of rail, no assist needed. No dizziness. supervison for safety  Transfers Overall transfer level: Needs assistance Equipment used: Rolling walker (2 wheeled) Transfers: Sit to/from [employer] Sit to Stand: Mod assist;+2 physical assistance Stand pivot transfers: Min assist;+2 safety/equipment       General transfer comment: Deferred due to tachycardia. RN present at end of session and aware. Pt being given PO meds. SPT bed to chair with Min A for balance/RW  management. HR ranged from 120-183 bpm.    Balance Overall balance assessment: Needs assistance Sitting-balance support: Feet supported;No upper extremity supported Sitting balance-Leahy Scale: Good Sitting balance - Comments: total A to donn/doff darco shoe   Standing balance support: During functional activity Standing balance-Leahy Scale: Poor Standing balance comment: Requires UE support and external support in standing, esp for SPT.                           ADL either performed or assessed with clinical judgement   ADL Overall ADL's : Needs assistance/impaired     Grooming: Oral care;Sitting;Supervision/safety;Set up               Lower Body Dressing: Maximal assistance;Bed level Lower Body Dressing Details (indicate cue type and reason): to don darco shoe Toilet Transfer: Minimal assistance;+2 for physical assistance;Stand-pivot;RW;Moderate assistance Toilet Transfer Details (indicate cue type and reason): simulated via functional mobility from EOB>recliner with RW. MOD A +2 to power into standing and MIN A +2 for safety for transfer         Functional mobility during ADLs: Minimal assistance;Moderate assistance;+2 for safety/equipment;+2 for physical assistance;Rolling walker General ADL Comments: session focus on functional mobility and seated grooming tasks     Vision Baseline Vision/History: (pt reports he needs to get his vision but unclear on what is actually wrong with vision, however pt noted to ask where chair was and where ADL items were despite being in visual field)     Perception     Praxis      Cognition Arousal/Alertness: Awake/alert  Behavior During Therapy: WFL for tasks assessed/performed Overall Cognitive Status: Within Functional Limits for tasks assessed                                 General Comments: for basic mobility tasks. slightly difficult to understand at times        Exercises    Shoulder  Instructions       General Comments HR ranged from 120-183 bpm. BP post transfer 102/81, asymptomatic    Pertinent Vitals/ Pain       Pain Assessment: Faces Faces Pain Scale: Hurts even more Pain Location: left foot with weight bearing Pain Descriptors / Indicators: Sore;Aching Pain Intervention(s): Monitored during session;Repositioned  Home Living                                          Prior Functioning/Environment              Frequency  Min 2X/week        Progress Toward Goals  OT Goals(current goals can now be found in the care plan section)  Progress towards OT goals: Progressing toward goals  Acute Rehab OT Goals Patient Stated Goal: Hopes to be home soon OT Goal Formulation: With patient Time For Goal Achievement: 07/11/19 Potential to Achieve Goals: Good  Plan Discharge plan remains appropriate    Co-evaluation      Reason for Co-Treatment: Complexity of the patient's impairments (multi-system involvement);To address functional/ADL transfers;For patient/therapist safety(for safety/tolerance) PT goals addressed during session: Mobility/safety with mobility        AM-PAC OT "6 Clicks" Daily Activity     Outcome Measure   Help from another person eating meals?: None Help from another person taking care of personal grooming?: A Little Help from another person toileting, which includes using toliet, bedpan, or urinal?: A Lot Help from another person bathing (including washing, rinsing, drying)?: A Lot Help from another person to put on and taking off regular upper body clothing?: None Help from another person to put on and taking off regular lower body clothing?: A Lot 6 Click Score: 17    End of Session Equipment Utilized During Treatment: Rolling walker;Gait belt;Other (comment)(darco shoe)  OT Visit Diagnosis: Unsteadiness on feet (R26.81);Muscle weakness (generalized) (M62.81)   Activity Tolerance Patient tolerated  treatment well;Other (comment)(HR limiting further mobility)   Patient Left in chair;with call bell/phone within reach   Nurse Communication Mobility status        Time: 0174-9449 OT Time Calculation (min): 23 min  Charges: OT General Charges $OT Visit: 1 Visit OT Treatments $Self Care/Home Management : 8-22 mins  Lanier Clam., COTA/L Acute Rehabilitation Services [phone] [place] 06/29/2019, 1:06 PM

## 2019-06-29 NOTE — Progress Notes (Addendum)
Physical Therapy Treatment Patient Details Name: Warren Mcdonald MRN: [id number] DOB: March 17, 1930 Today's Date: 06/29/2019    History of Present Illness 86 year old male s/p non healing wound requiring open transmetatarsal amputation of left third, fourth and fifth PMHx of popliteal to posterior tibial artery bypass, PVD, HTN, T2DM. HE wore a darco show when wtbearing on LLE.    PT Comments    Patient progressing very slowly towards PT goals. Requires mod A of 2 to stand from EOB and Min A for SPT to chair. Mobility and ambulation limited due to elevated HR, ranging from 120-183 bpm, asymptomatic. Tolerated some there ex and ADLs sitting in chair. Encouraged pt to sit up in chair as much as tolerated. RN present and aware of HR, giving meds to try to help. Recommend cardiology consult to manage HR. Will follow.   Follow Up Recommendations  Home health PT;Other (comment)(consider [facility])     Equipment Recommendations  Rolling walker with 5" wheels;3in1 (PT)    Recommendations for Other Services       Precautions / Restrictions Precautions Precautions: Fall Precaution Comments: watch HR Restrictions Weight Bearing Restrictions: No Other Position/Activity Restrictions: No WB restrictions in orders    Mobility  Bed Mobility Overal bed mobility: Needs Assistance Bed Mobility: Supine to Sit     Supine to sit: HOB elevated;Min guard     General bed mobility comments: Able to get to EOB with use of rail, no assist needed. No dizziness.  Transfers Overall transfer level: Needs assistance Equipment used: Rolling walker (2 wheeled) Transfers: Sit to/from [employer] Sit to Stand: Mod assist;+2 physical assistance Stand pivot transfers: Min assist       General transfer comment: Deferred due to tachycardia. RN present at end of session and aware. Pt being given PO meds. SPT bed to chair with Min A for balance/RW management. HR ranged from 120-183  bpm.  Ambulation/Gait             General Gait Details: Deferred, see above.   Stairs             Wheelchair Mobility    Modified Rankin (Stroke Patients Only)       Balance Overall balance assessment: Needs assistance Sitting-balance support: Feet supported;No upper extremity supported Sitting balance-Leahy Scale: Good Sitting balance - Comments: total A to donn/doff darco shoe   Standing balance support: During functional activity Standing balance-Leahy Scale: Poor Standing balance comment: Requires UE support and external support in standing, esp for SPT.                            Cognition Arousal/Alertness: Awake/alert Behavior During Therapy: WFL for tasks assessed/performed Overall Cognitive Status: Within Functional Limits for tasks assessed                                 General Comments: for basic mobility tasks.      Exercises General Exercises - Lower Extremity Ankle Circles/Pumps: Both;Supine;10 reps Long Arc Quad: Left;5 reps;Seated    General Comments General comments (skin integrity, edema, etc.): HR ranged from 120-183 bpm. BP post transfer 102/81, asymptomatic.      Pertinent Vitals/Pain Pain Assessment: Faces Faces Pain Scale: Hurts even more Pain Location: left foot with weight bearing Pain Descriptors / Indicators: Sore;Aching Pain Intervention(s): Repositioned;Monitored during session    Home Living  Prior Function            PT Goals (current goals can now be found in the care plan section) Progress towards PT goals: Progressing toward goals(slowly)    Frequency    Min 3X/week      PT Plan Current plan remains appropriate    Co-evaluation PT/OT/SLP Co-Evaluation/Treatment: Yes Reason for Co-Treatment: Complexity of the patient's impairments (multi-system involvement);To address functional/ADL transfers;For patient/therapist safety(for  safety/tolerance) PT goals addressed during session: Mobility/safety with mobility        AM-PAC PT "6 Clicks" Mobility   Outcome Measure  Help needed turning from your back to your side while in a flat bed without using bedrails?: None Help needed moving from lying on your back to sitting on the side of a flat bed without using bedrails?: None Help needed moving to and from a bed to a chair (including a wheelchair)?: A Lot Help needed standing up from a chair using your arms (e.g., wheelchair or bedside chair)?: A Lot Help needed to walk in hospital room?: A Little Help needed climbing 3-5 steps with a railing? : A Lot 6 Click Score: 17    End of Session Equipment Utilized During Treatment: Gait belt Activity Tolerance: Treatment limited secondary to medical complications (Comment)(HR) Patient left: in chair;with call bell/phone within reach;with nursing/sitter in room Nurse Communication: Mobility status;Other (comment)(HR, RN present) PT Visit Diagnosis: Other abnormalities of gait and mobility (R26.89);Muscle weakness (generalized) (M62.81);Difficulty in walking, not elsewhere classified (R26.2)     Time: 1030-1053 PT Time Calculation (min) (ACUTE ONLY): 23 min  Charges:  $Therapeutic Activity: 8-22 mins                     Vale Haven, PT, DPT Acute Rehabilitation Services Pager [phone] Office [phone]       Warren Mcdonald 06/29/2019, 12:32 PM

## 2019-06-30 ENCOUNTER — Encounter (HOSPITAL_COMMUNITY)
Admission: EM | Disposition: A | Discharge status: Home Health | Payer: Self-pay | Source: Home / Self Care | Attending: Surgery

## 2019-06-30 ENCOUNTER — Inpatient Hospital Stay (HOSPITAL_COMMUNITY): Payer: Medicare Other

## 2019-06-30 ENCOUNTER — Encounter (HOSPITAL_COMMUNITY): Payer: Self-pay | Admitting: Vascular Surgery

## 2019-06-30 DIAGNOSIS — T8781 Dehiscence of amputation stump: Secondary | ICD-10-CM

## 2019-06-30 HISTORY — PX: I & D EXTREMITY: SHX5045

## 2019-06-30 LAB — BASIC METABOLIC PANEL
Anion gap: 11 (ref 5–15)
BUN: 20 mg/dL (ref 8–23)
CO2: 25 mmol/L (ref 22–32)
Calcium: 8.8 mg/dL — ABNORMAL LOW (ref 8.9–10.3)
Chloride: 100 mmol/L (ref 98–111)
Creatinine, Ser: 1.46 mg/dL — ABNORMAL HIGH (ref 0.61–1.24)
GFR calc Af Amer: 49 mL/min — ABNORMAL LOW (ref >60)
GFR calc non Af Amer: 43 mL/min — ABNORMAL LOW (ref >60)
Glucose, Bld: 176 mg/dL — ABNORMAL HIGH (ref 70–99)
Potassium: 3.3 mmol/L — ABNORMAL LOW (ref 3.5–5.1)
Sodium: 136 mmol/L (ref 135–145)

## 2019-06-30 LAB — GLUCOSE, CAPILLARY
Glucose-Capillary: 160 mg/dL — ABNORMAL HIGH (ref 70–99)
Glucose-Capillary: 145 mg/dL — ABNORMAL HIGH (ref 70–99)
Glucose-Capillary: 153 mg/dL — ABNORMAL HIGH (ref 70–99)
Glucose-Capillary: 177 mg/dL — ABNORMAL HIGH (ref 70–99)
Glucose-Capillary: 189 mg/dL — ABNORMAL HIGH (ref 70–99)

## 2019-06-30 LAB — CBC
HCT: 33.4 % — ABNORMAL LOW (ref 39.0–52.0)
Hemoglobin: 10.8 g/dL — ABNORMAL LOW (ref 13.0–17.0)
MCH: 29.6 pg (ref 26.0–34.0)
MCHC: 32.3 g/dL (ref 30.0–36.0)
MCV: 91.5 fL (ref 80.0–100.0)
Platelets: 379 10*3/uL (ref 150–400)
RBC: 3.65 MIL/uL — ABNORMAL LOW (ref 4.22–5.81)
RDW: 13.6 % (ref 11.5–15.5)
WBC: 11.6 10*3/uL — ABNORMAL HIGH (ref 4.0–10.5)
nRBC: 0 % (ref 0.0–0.2)

## 2019-06-30 SURGERY — IRRIGATION AND DEBRIDEMENT EXTREMITY
Anesthesia: Monitor Anesthesia Care | Site: Leg Lower | Laterality: Left

## 2019-06-30 MED ORDER — PROPOFOL 10 MG/ML IV BOLUS
INTRAVENOUS | Status: AC
  Filled 2019-06-30: qty 20

## 2019-06-30 MED ORDER — FENTANYL CITRATE (PF) 250 MCG/5ML IJ SOLN
INTRAMUSCULAR | Status: AC
  Filled 2019-06-30: qty 5

## 2019-06-30 MED ORDER — 0.9 % SODIUM CHLORIDE (POUR BTL) OPTIME
TOPICAL | Status: DC | PRN
  Administered 2019-06-30: 1000 mL

## 2019-06-30 MED ORDER — LIDOCAINE HCL (PF) 1 % IJ SOLN
INTRAMUSCULAR | Status: DC | PRN
  Administered 2019-06-30: 10 mL via INTRADERMAL

## 2019-06-30 MED ORDER — PHENYLEPHRINE 40 MCG/ML (10ML) SYRINGE FOR IV PUSH (FOR BLOOD PRESSURE SUPPORT)
PREFILLED_SYRINGE | INTRAVENOUS | Status: DC | PRN
  Administered 2019-06-30: 40 ug via INTRAVENOUS
  Administered 2019-06-30: 80 ug via INTRAVENOUS
  Administered 2019-06-30: 120 ug via INTRAVENOUS

## 2019-06-30 MED ORDER — LACTATED RINGERS IV SOLN: INTRAVENOUS | Status: DC

## 2019-06-30 MED ORDER — ONDANSETRON HCL 4 MG/2ML IJ SOLN: 4.0000 mg | Freq: Once | INTRAMUSCULAR | Status: DC | PRN

## 2019-06-30 MED ORDER — FENTANYL CITRATE (PF) 250 MCG/5ML IJ SOLN
INTRAMUSCULAR | Status: DC | PRN
  Administered 2019-06-30: 50 ug via INTRAVENOUS

## 2019-06-30 MED ORDER — ONDANSETRON HCL 4 MG/2ML IJ SOLN
INTRAMUSCULAR | Status: DC | PRN
  Administered 2019-06-30: 4 mg via INTRAVENOUS

## 2019-06-30 MED ORDER — PROPOFOL 500 MG/50ML IV EMUL
INTRAVENOUS | Status: DC | PRN
  Administered 2019-06-30: 25 ug/kg/min via INTRAVENOUS

## 2019-06-30 MED ORDER — LIDOCAINE 2% (20 MG/ML) 5 ML SYRINGE
INTRAMUSCULAR | Status: DC | PRN
  Administered 2019-06-30: 40 mg via INTRAVENOUS

## 2019-06-30 MED ORDER — LIDOCAINE HCL (PF) 1 % IJ SOLN
INTRAMUSCULAR | Status: AC
  Filled 2019-06-30: qty 30

## 2019-06-30 MED ORDER — PHENYLEPHRINE 40 MCG/ML (10ML) SYRINGE FOR IV PUSH (FOR BLOOD PRESSURE SUPPORT)
PREFILLED_SYRINGE | INTRAVENOUS | Status: AC
  Filled 2019-06-30: qty 10

## 2019-06-30 MED ORDER — FENTANYL CITRATE (PF) 100 MCG/2ML IJ SOLN: 25.0000 ug | INTRAMUSCULAR | Status: DC | PRN

## 2019-06-30 MED ORDER — METOPROLOL TARTRATE 5 MG/5ML IV SOLN
INTRAVENOUS | Status: AC
  Administered 2019-06-30: 2.5 mg via INTRAVENOUS
  Filled 2019-06-30: qty 5

## 2019-06-30 MED ORDER — PROPOFOL 1000 MG/100ML IV EMUL
INTRAVENOUS | Status: AC
  Filled 2019-06-30: qty 100

## 2019-06-30 MED ORDER — LIDOCAINE 2% (20 MG/ML) 5 ML SYRINGE
INTRAMUSCULAR | Status: AC
  Filled 2019-06-30: qty 5

## 2019-06-30 MED ORDER — METOPROLOL TARTRATE 5 MG/5ML IV SOLN
2.5000 mg | INTRAVENOUS | Status: AC | PRN
  Administered 2019-06-30: 2.5 mg via INTRAVENOUS

## 2019-06-30 SURGICAL SUPPLY — 42 items
BLADE SURG 10 STRL SS (BLADE) ×3 IMPLANT
BNDG ELASTIC 4X5.8 VLCR STR LF (GAUZE/BANDAGES/DRESSINGS) ×3 IMPLANT
BNDG ELASTIC 6X5.8 VLCR STR LF (GAUZE/BANDAGES/DRESSINGS) IMPLANT
BNDG GAUZE ELAST 4 BULKY (GAUZE/BANDAGES/DRESSINGS) ×6 IMPLANT
CANISTER SUCT 3000ML PPV (MISCELLANEOUS) ×3 IMPLANT
CLIP VESOCCLUDE MED 6/CT (CLIP) IMPLANT
CLIP VESOCCLUDE SM WIDE 6/CT (CLIP) IMPLANT
COVER SURGICAL LIGHT HANDLE (MISCELLANEOUS) ×3 IMPLANT
COVER WAND RF STERILE (DRAPES) IMPLANT
DRAPE EXTREMITY T 121X128X90 (DISPOSABLE) ×3 IMPLANT
DRAPE HALF SHEET 40X57 (DRAPES) IMPLANT
DRAPE U-SHAPE 76X120 STRL (DRAPES) IMPLANT
ELECT REM PT RETURN 9FT ADLT (ELECTROSURGICAL) ×3
ELECTRODE REM PT RTRN 9FT ADLT (ELECTROSURGICAL) ×1 IMPLANT
GAUZE SPONGE 4X4 12PLY STRL (GAUZE/BANDAGES/DRESSINGS) ×3 IMPLANT
GAUZE XEROFORM 5X9 LF (GAUZE/BANDAGES/DRESSINGS) IMPLANT
GLOVE BIOGEL PI IND STRL 7.5 (GLOVE) ×1 IMPLANT
GLOVE BIOGEL PI INDICATOR 7.5 (GLOVE) ×2
GLOVE SURG SS PI 7.5 STRL IVOR (GLOVE) ×3 IMPLANT
GOWN STRL REUS W/ TWL LRG LVL3 (GOWN DISPOSABLE) ×2 IMPLANT
GOWN STRL REUS W/ TWL XL LVL3 (GOWN DISPOSABLE) ×1 IMPLANT
GOWN STRL REUS W/TWL LRG LVL3 (GOWN DISPOSABLE) ×4
GOWN STRL REUS W/TWL XL LVL3 (GOWN DISPOSABLE) ×2
KIT BASIN OR (CUSTOM PROCEDURE TRAY) ×3 IMPLANT
KIT TURNOVER KIT B (KITS) ×3 IMPLANT
NEEDLE HYPO 25GX1X1/2 BEV (NEEDLE) ×3 IMPLANT
NS IRRIG 1000ML POUR BTL (IV SOLUTION) ×3 IMPLANT
PACK CV ACCESS (CUSTOM PROCEDURE TRAY) IMPLANT
PACK GENERAL/GYN (CUSTOM PROCEDURE TRAY) ×3 IMPLANT
PACK UNIVERSAL I (CUSTOM PROCEDURE TRAY) IMPLANT
PAD ARMBOARD 7.5X6 YLW CONV (MISCELLANEOUS) ×6 IMPLANT
PULSAVAC PLUS IRRIG FAN TIP (DISPOSABLE)
SLEEVE SURGEON STRL (DRAPES) ×3 IMPLANT
SUT ETHILON 3 0 PS 1 (SUTURE) IMPLANT
SUT VIC AB 2-0 CTX 36 (SUTURE) IMPLANT
SUT VIC AB 3-0 SH 27 (SUTURE)
SUT VIC AB 3-0 SH 27X BRD (SUTURE) IMPLANT
SUT VICRYL 4-0 PS2 18IN ABS (SUTURE) IMPLANT
SYR CONTROL 10ML LL (SYRINGE) ×3 IMPLANT
TIP FAN IRRIG PULSAVAC PLUS (DISPOSABLE) IMPLANT
TOWEL GREEN STERILE (TOWEL DISPOSABLE) ×3 IMPLANT
WATER STERILE IRR 1000ML POUR (IV SOLUTION) ×3 IMPLANT

## 2019-06-30 NOTE — Op Note (Signed)
Date: June 30, 2019  Preoperative diagnosis: Necrotic non-healing left third, fourth and fifth open TMA  Postoperative diagnosis: Same  Procedure: Sharp excisional debridement of open left foot TMA including bone, tendon, muscle and skin (4 cm x 11 cm)   Surgeon: Cephus Shelling, MD  Assistant: OR staff  Indication: Patient is an 86 year old male who previously had a left popliteal to posterior tibial artery bypass and recently underwent endovascular intervention.  He had necrotic left fourth toe and underwent amputation.  This was non-healing and subsequently had an open left third, fourth, and fifth TMA that is now nonhealing.  He presents today for further debridement.  Findings: The wound edges had to be excised back to healthy margin.  Resected the left third, fourth, and fifth metatarsal heads back to healthy bone margin.  All the surrounding tendon and muscle was also further debrided back.  There is good bleeding in the wound bed now.   Details: Patient was taken to the operating room after informed consent was obtained.  Placed on operative table in supine position and his previous dressing was removed.  After anesthesia induced the left foot was prepped and draped in usual sterile fashion.  Patient was given preoperative antibiotics.  Timeout was performed.  Initially used #10 blade scalpel and I performed sharp excisional debridement of the skin and subcutaneous tissue around the wound margin of the open left TMA.  Became evident that the bone in the wound bed did not appear viable and ultimately the metatarsal heads were resected back with bone cutters from the third fourth and fifth metatarsal bones to healthier bone.  I then used a rongeur and scalpel and sharp excisional debridement of all surrounding tendon muscle was performed around the third, fourth, and fifth metatarsal bones back to healthy margin with bleeding in the wound bed.  All this was irrigated copiously with  saline until the effluent was clear.  Saline soaked sterile Kerlix was packed in the wound and this was wrapped with a dry Kerlix and Ace bandage.  He tolerated procedure without any apparent complications.  Complication: None  Condition: Stable  Cephus Shelling, MD [facility] [place] Office: [phone]   Cephus Shelling

## 2019-06-30 NOTE — Anesthesia Postprocedure Evaluation (Signed)
Anesthesia Post Note  Patient: Warren Mcdonald  Procedure(s) Performed: sharp incisonal DEBRIDEMENT of left foot including bone, tendon, muscle, and skin (Left Leg Lower)     Patient location during evaluation: PACU Anesthesia Type: MAC Level of consciousness: awake Pain management: pain level controlled Vital Signs Assessment: post-procedure vital signs reviewed and stable Respiratory status: spontaneous breathing, nonlabored ventilation, respiratory function stable and patient connected to nasal cannula oxygen Cardiovascular status: stable and blood pressure returned to baseline Postop Assessment: no apparent nausea or vomiting Anesthetic complications: no    Last Vitals:  Vitals:   06/30/19 1425 06/30/19 1453  BP: 131/72 (!) 141/73  Pulse: 100 96  Resp: 15 15  Temp: 36.9 C 37.3 C  SpO2: 98% 98%    Last Pain:  Vitals:   06/30/19 1453  TempSrc: Oral  PainSc:                  [name] P Darnesha Diloreto

## 2019-06-30 NOTE — Progress Notes (Signed)
OR change in original 1:05pm schedule.  Report called in to short stay.

## 2019-06-30 NOTE — Progress Notes (Signed)
Patient's heart rate in 130's-140's.  Dr. Bradley Ferris made aware.  Procedure is cancelled at this point.  Report given to [facility] on 4E.

## 2019-06-30 NOTE — Anesthesia Procedure Notes (Signed)
Procedure Name: MAC Date/Time: 06/30/2019 1:02 PM Performed by: Barrington Ellison, CRNA Pre-anesthesia Checklist: Patient identified, Emergency Drugs available, Suction available and Patient being monitored Patient Re-evaluated:Patient Re-evaluated prior to induction Oxygen Delivery Method: Simple face mask

## 2019-06-30 NOTE — Anesthesia Preprocedure Evaluation (Addendum)
Anesthesia Evaluation  Patient identified by MRN, date of birth, ID band Patient awake    Reviewed: Allergy & Precautions, NPO status , Patient's Chart, lab work & pertinent test results  Airway Mallampati: III  TM Distance: >3 FB Neck ROM: Full    Dental  (+) Missing, Poor Dentition   Pulmonary former smoker,    Pulmonary exam normal breath sounds clear to auscultation       Cardiovascular hypertension, Pt. on medications and Pt. on home beta blockers + Peripheral Vascular Disease and +CHF  Normal cardiovascular exam+ dysrhythmias Atrial Fibrillation  Rhythm:Irregular Rate:Normal  ECG: a-fib   Neuro/Psych negative neurological ROS  negative psych ROS   GI/Hepatic negative GI ROS, Neg liver ROS,   Endo/Other  diabetes, Oral Hypoglycemic Agents  Renal/GU negative Renal ROS     Musculoskeletal negative musculoskeletal ROS (+)   Abdominal   Peds  Hematology  (+) anemia ,   Anesthesia Other Findings NON HEALING WOUND LEFT FOOT  Reproductive/Obstetrics                            Anesthesia Physical Anesthesia Plan  ASA: IV  Anesthesia Plan: MAC   Post-op Pain Management:    Induction: Intravenous  PONV Risk Score and Plan: 1 and Ondansetron, Treatment may vary due to age or medical condition and Propofol infusion  Airway Management Planned: Simple Face Mask  Additional Equipment:   Intra-op Plan:   Post-operative Plan:   Informed Consent: I have reviewed the patients History and Physical, chart, labs and discussed the procedure including the risks, benefits and alternatives for the proposed anesthesia with the patient or authorized representative who has indicated his/her understanding and acceptance.     Dental advisory given  Plan Discussed with: CRNA  Anesthesia Plan Comments: (A-fib with RVR. Heart rate treat in pre-op.)        Anesthesia Quick Evaluation

## 2019-06-30 NOTE — Transfer of Care (Signed)
Immediate Anesthesia Transfer of Care Note  Patient: Warren Mcdonald  Procedure(s) Performed: sharp incisonal DEBRIDEMENT of left foot including bone, tendon, muscle, and skin (Left Leg Lower)  Patient Location: PACU  Anesthesia Type:MAC  Level of Consciousness: awake, alert  and oriented  Airway & Oxygen Therapy: Patient Spontanous Breathing  Post-op Assessment: Report given to RN  Post vital signs: Reviewed and stable  Last Vitals:  Vitals Value Taken Time  BP 128/81 06/30/19 1355  Temp    Pulse 99 06/30/19 1355  Resp 19 06/30/19 1355  SpO2 96 % 06/30/19 1355  Vitals shown include unvalidated device data.  Last Pain:  Vitals:   06/30/19 1030  TempSrc: Oral  PainSc:       Patients Stated Pain Goal: 0 (18/84/16 6063)  Complications: No apparent anesthesia complications

## 2019-06-30 NOTE — Care Management Important Message (Signed)
Important Message  Patient Details  Name: Warren Mcdonald MRN: [id number] Date of Birth: 06-21-1929   Medicare Important Message Given:  Yes     Renie Ora 06/30/2019, 11:56 AM

## 2019-07-01 ENCOUNTER — Ambulatory Visit: Payer: Medicare Other | Admitting: Family Medicine

## 2019-07-01 LAB — GLUCOSE, CAPILLARY
Glucose-Capillary: 155 mg/dL — ABNORMAL HIGH (ref 70–99)
Glucose-Capillary: 207 mg/dL — ABNORMAL HIGH (ref 70–99)
Glucose-Capillary: 215 mg/dL — ABNORMAL HIGH (ref 70–99)
Glucose-Capillary: 176 mg/dL — ABNORMAL HIGH (ref 70–99)

## 2019-07-01 NOTE — Progress Notes (Addendum)
  Progress Note    07/01/2019 8:21 AM 1 Day Post-Op  Subjective:  Pain controlled with pain medication   Vitals:   06/30/19 1453 07/01/19 0411  BP:  109/74  Pulse:  (!) 102  Resp: 15   Temp:  98.9 F (37.2 C)  SpO2:  98%   Physical Exam: Lungs:  Non labored Incisions:        Extremities:  ATA/PT by doppler L LE Neurologic: A&O  CBC    Component Value Date/Time   WBC 11.6 (H) 06/30/2019 0255   RBC 3.65 (L) 06/30/2019 0255   HGB 10.8 (L) 06/30/2019 0255   HCT 33.4 (L) 06/30/2019 0255   PLT 379 06/30/2019 0255   MCV 91.5 06/30/2019 0255   MCH 29.6 06/30/2019 0255   MCHC 32.3 06/30/2019 0255   RDW 13.6 06/30/2019 0255   LYMPHSABS 1.2 09/10/2014 0433   MONOABS 0.8 09/10/2014 0433   EOSABS 0.2 09/10/2014 0433   BASOSABS 0.1 09/10/2014 0433    BMET    Component Value Date/Time   NA 136 06/30/2019 0255   NA 138 11/03/2017 0000   K 3.3 (L) 06/30/2019 0255   CL 100 06/30/2019 0255   CO2 25 06/30/2019 0255   GLUCOSE 176 (H) 06/30/2019 0255   BUN 20 06/30/2019 0255   BUN 23 (A) 11/03/2017 0000   CREATININE 1.46 (H) 06/30/2019 0255   CALCIUM 8.8 (L) 06/30/2019 0255   GFRNONAA 43 (L) 06/30/2019 0255   GFRAA 49 (L) 06/30/2019 0255    INR    Component Value Date/Time   INR 1.1 06/16/2019 0857     Intake/Output Summary (Last 24 hours) at 07/01/2019 2355 Last data filed at 07/01/2019 0533 Gross per 24 hour  Intake 480 ml  Output 1109 ml  Net -629 ml     Assessment/Plan:  86 y.o. male is s/p further debridement of L foot 1 Day Post-Op   L LE with stable AT and PT doppler signals Continue wet to dry dressing changes BID Discussed high risk for BKA with patient   Emilie Rutter, PA-C [facility] [phone] 07/01/2019 8:21 AM   I have independently interviewed and examined patient and agree with PA assessment and plan above.  Foot does have some continued foul smell with persistent necrosis on the plantar aspect.  I will make  patient n.p.o. past midnight for possible further debridement versus more likely the need for below-knee amputation tomorrow.  Shikara Mcauliffe C. Randie Heinz, MD [facility] [place] Office: [phone] Pager: [phone]  Addendum: I discussed patient's case with granddaughter [name] and the need for either further debridement or left below-knee amputation versus above-knee amputation.  She states that her grandfather at this time desires to have a few procedures as necessary particularly if amputation is inevitable.  [name] is available at [phone].  Lemar Livings, MD

## 2019-07-01 NOTE — Progress Notes (Signed)
Physical Therapy Treatment Patient Details Name: Warren Mcdonald MRN: [id number] DOB: May 25, 1930 Today's Date: 07/01/2019    History of Present Illness 86 year old male s/p non healing wound requiring open transmetatarsal amputation of left third, fourth and fifth PMHx of popliteal to posterior tibial artery bypass, PVD, HTN, T2DM. HE wore a darco show when wtbearing on LLE.    PT Comments    Patient seen for mobility progression. Pt presents with generalized weakness and decreased activity tolerance. Pt able to ambulate short distance in room before fatigued. Attempted to hop with R foot in preparation for potential L BKA however pt unable this session.  HR 120-173 bpm. PT will continue to follow acutely and progress as tolerated.    Follow Up Recommendations  Home health PT;Other (comment)(consider [facility])     Equipment Recommendations  Rolling walker with 5" wheels;3in1 (PT)    Recommendations for Other Services       Precautions / Restrictions Precautions Precautions: Fall Precaution Comments: watch HR Required Braces or Orthoses: (Darco shoe) Restrictions Weight Bearing Restrictions: No Other Position/Activity Restrictions: No WB restrictions in orders    Mobility  Bed Mobility Overal bed mobility: Needs Assistance Bed Mobility: Supine to Sit     Supine to sit: HOB elevated;Supervision     General bed mobility comments: Able to get to EOB with use of rail, no assist needed, supervison for safety  Transfers Overall transfer level: Needs assistance Equipment used: Rolling walker (2 wheeled) Transfers: Sit to/from Stand Sit to Stand: Mod assist;+2 physical assistance         General transfer comment: Pt required MOD A +2 to power into standing x2 trials from EOB and recliner.  MIN A +2 to take steps to recliner, with pt unable to hop with R foot. HR ranged from 120- 173 bpm with pt asymptomatic  Ambulation/Gait Ambulation/Gait assistance: Min  guard;Min assist Gait Distance (Feet): 10 Feet Assistive device: Rolling walker (2 wheeled) Gait Pattern/deviations: Trunk flexed;Step-to pattern;Decreased stance time - left;Decreased step length - right;Decreased step length - left;Decreased weight shift to left     General Gait Details: heavy reliance on bilat UE extermities and unable to hop on R foot only; pt fatigues quickly and initially needing a little assistance to manage RW    Stairs             Wheelchair Mobility    Modified Rankin (Stroke Patients Only)       Balance Overall balance assessment: Needs assistance Sitting-balance support: Feet supported;No upper extremity supported Sitting balance-Leahy Scale: Good Sitting balance - Comments: MAX A to donn/doff darco shoe and shoe for R foot   Standing balance support: Bilateral upper extremity supported Standing balance-Leahy Scale: Poor Standing balance comment: Requires BUE support and external support                            Cognition Arousal/Alertness: Awake/alert Behavior During Therapy: WFL for tasks assessed/performed Overall Cognitive Status: Within Functional Limits for tasks assessed                                 General Comments: HOH and needs repetition of cues; [facility] for basic mobility tasks. slightly difficult to understand at times. very appreciative of session      Exercises      General Comments General comments (skin integrity, edema, etc.): HR 120-173 bpm  Pertinent Vitals/Pain Pain Assessment: Faces Faces Pain Scale: Hurts even more Pain Location: left foot with weight bearing Pain Descriptors / Indicators: Sore;Aching Pain Intervention(s): Limited activity within patient's tolerance;Monitored during session;Repositioned    Home Living                      Prior Function            PT Goals (current goals can now be found in the care plan section) Acute Rehab PT Goals Patient  Stated Goal: Hopes to be home soon Progress towards PT goals: Progressing toward goals    Frequency    Min 3X/week      PT Plan Current plan remains appropriate    Co-evaluation PT/OT/SLP Co-Evaluation/Treatment: Yes Reason for Co-Treatment: For patient/therapist safety;To address functional/ADL transfers;Complexity of the patient's impairments (multi-system involvement) PT goals addressed during session: Mobility/safety with mobility        AM-PAC PT "6 Clicks" Mobility   Outcome Measure  Help needed turning from your back to your side while in a flat bed without using bedrails?: None Help needed moving from lying on your back to sitting on the side of a flat bed without using bedrails?: None Help needed moving to and from a bed to a chair (including a wheelchair)?: A Lot Help needed standing up from a chair using your arms (e.g., wheelchair or bedside chair)?: A Lot Help needed to walk in hospital room?: A Little Help needed climbing 3-5 steps with a railing? : A Lot 6 Click Score: 17    End of Session Equipment Utilized During Treatment: Gait belt Activity Tolerance: Patient tolerated treatment well Patient left: in chair;with call bell/phone within reach Nurse Communication: Mobility status PT Visit Diagnosis: Other abnormalities of gait and mobility (R26.89);Muscle weakness (generalized) (M62.81);Difficulty in walking, not elsewhere classified (R26.2)     Time: 7001-7494 PT Time Calculation (min) (ACUTE ONLY): 32 min  Charges:  $Gait Training: 8-22 mins                     Erline Levine, PTA Acute Rehabilitation Services Pager: [phone] Office: [phone]     Carolynne Edouard 07/01/2019, 1:33 PM

## 2019-07-01 NOTE — Progress Notes (Signed)
Occupational Therapy Treatment Patient Details Name: Warren Mcdonald MRN: [id number] DOB: [birth year] Today's Date: 07/01/2019    History of present illness 86 year old male s/p non healing wound requiring open transmetatarsal amputation of left third, fourth and fifth PMHx of popliteal to posterior tibial artery bypass, PVD, HTN, T2DM. HE wore a darco show when wtbearing on LLE.   OT comments  Pt making steady progress towards OT goals this session. Session focus on functional mobility as precursor to higher level ADLs. Pt unble to hop on R foot however, pt was able to take a couple steps to recliner this session with RW and MIN A +2.   Pt completed x2 sit<>stands from EOB and recliner needing MODA +2 to power into standing. Pt unable to complete standing grooming tasks at sinks d/t pain and feeling dizzy. HR ranged from 120 bpm- 173 bpm with pt asymptomatic. Pt likely to return to OR for additional surgery. Will continue to follow acutely and update DC recs as needed.    Follow Up Recommendations  Home health OT;Supervision/Assistance - 24 hour    Equipment Recommendations  3 in 1 bedside commode    Recommendations for Other Services      Precautions / Restrictions Precautions Precautions: Fall Precaution Comments: watch HR Restrictions Weight Bearing Restrictions: No Other Position/Activity Restrictions: No WB restrictions in orders       Mobility Bed Mobility Overal bed mobility: Needs Assistance Bed Mobility: Supine to Sit     Supine to sit: HOB elevated;Supervision     General bed mobility comments: Able to get to EOB with use of rail, no assist needed, supervison for safety  Transfers Overall transfer level: Needs assistance Equipment used: Rolling walker (2 wheeled) Transfers: Sit to/from Stand Sit to Stand: Mod assist;+2 physical assistance         General transfer comment: Pt required MOD A +2 to power into standing x2 trials from EOB and recliner.  MIN A +2  to take steps to recliner, with pt unable to hop with R foot. HR ranged from 120- 173 bpm with pt asymptomatic    Balance Overall balance assessment: Needs assistance Sitting-balance support: Feet supported;No upper extremity supported Sitting balance-Leahy Scale: Good Sitting balance - Comments: MAX A to donn/doff darco shoe and shoe for R foot   Standing balance support: Bilateral upper extremity supported Standing balance-Leahy Scale: Poor Standing balance comment: Requires BUE support and external support                           ADL either performed or assessed with clinical judgement   ADL Overall ADL's : Needs assistance/impaired     Grooming: Wash/dry face;Sitting;Set up               Lower Body Dressing: Maximal assistance;Bed level Lower Body Dressing Details (indicate cue type and reason): to don darco shoe Toilet Transfer: Minimal assistance;+2 for physical assistance;RW;Moderate assistance;Ambulation Toilet Transfer Details (indicate cue type and reason): simulated via functional mobility from EOB>recliner with RW. MOD A +2 to power into standing and MIN A +2 for safety for transfer. pt able to take a couple steps to recliner this session but unable to hop         Functional mobility during ADLs: Minimal assistance;Moderate assistance;+2 for safety/equipment;+2 for physical assistance;Rolling walker General ADL Comments: session focus on functional mobility and seated grooming tasks     Vision       Perception  Praxis      Cognition Arousal/Alertness: Awake/alert Behavior During Therapy: WFL for tasks assessed/performed Overall Cognitive Status: Within Functional Limits for tasks assessed                                 General Comments: for basic mobility tasks. slightly difficult to understand at times. very appreciative of session        Exercises     Shoulder Instructions       General Comments HR ranged from  120 bpm- 173 bpm with pt asymptomatic    Pertinent Vitals/ Pain       Pain Assessment: Faces Faces Pain Scale: Hurts even more Pain Location: left foot with weight bearing Pain Descriptors / Indicators: Sore;Aching Pain Intervention(s): Monitored during session;Repositioned  Home Living                                          Prior Functioning/Environment              Frequency  Min 2X/week        Progress Toward Goals  OT Goals(current goals can now be found in the care plan section)  Progress towards OT goals: Progressing toward goals  Acute Rehab OT Goals Patient Stated Goal: Hopes to be home soon OT Goal Formulation: With patient Time For Goal Achievement: 07/11/19 Potential to Achieve Goals: Good  Plan Discharge plan remains appropriate    Co-evaluation                 AM-PAC OT "6 Clicks" Daily Activity     Outcome Measure   Help from another person eating meals?: None Help from another person taking care of personal grooming?: A Little Help from another person toileting, which includes using toliet, bedpan, or urinal?: A Lot Help from another person bathing (including washing, rinsing, drying)?: A Lot Help from another person to put on and taking off regular upper body clothing?: None Help from another person to put on and taking off regular lower body clothing?: A Lot 6 Click Score: 17    End of Session Equipment Utilized During Treatment: Gait belt;Rolling walker;Other (comment)(darco shoe)  OT Visit Diagnosis: Unsteadiness on feet (R26.81);Muscle weakness (generalized) (M62.81)   Activity Tolerance Patient tolerated treatment well   Patient Left in chair;with call bell/phone within reach   Nurse Communication          Time: 8502-7741 OT Time Calculation (min): 33 min  Charges: OT General Charges $OT Visit: 1 Visit OT Treatments $Therapeutic Activity: 8-22 mins  Warren Amel., COTA/L Acute Rehabilitation  Services [phone]    Angelina Pih 07/01/2019, 11:17 AM

## 2019-07-02 ENCOUNTER — Encounter (HOSPITAL_COMMUNITY)
Admission: EM | Disposition: A | Discharge status: Home Health | Payer: Self-pay | Source: Home / Self Care | Attending: Surgery

## 2019-07-02 ENCOUNTER — Encounter (HOSPITAL_COMMUNITY): Payer: Self-pay | Admitting: Vascular Surgery

## 2019-07-02 ENCOUNTER — Inpatient Hospital Stay (HOSPITAL_COMMUNITY): Payer: Medicare Other | Admitting: Anesthesiology

## 2019-07-02 DIAGNOSIS — I96 Gangrene, not elsewhere classified: Secondary | ICD-10-CM

## 2019-07-02 HISTORY — PX: AMPUTATION: SHX166

## 2019-07-02 LAB — TROPONIN I (HIGH SENSITIVITY): Troponin I (High Sensitivity): 8 ng/L (ref <18)

## 2019-07-02 LAB — CBC
HCT: 31.6 % — ABNORMAL LOW (ref 39.0–52.0)
Hemoglobin: 10 g/dL — ABNORMAL LOW (ref 13.0–17.0)
MCH: 29 pg (ref 26.0–34.0)
MCHC: 31.6 g/dL (ref 30.0–36.0)
MCV: 91.6 fL (ref 80.0–100.0)
Platelets: 374 10*3/uL (ref 150–400)
RBC: 3.45 MIL/uL — ABNORMAL LOW (ref 4.22–5.81)
RDW: 13.7 % (ref 11.5–15.5)
WBC: 8.9 10*3/uL (ref 4.0–10.5)
nRBC: 0 % (ref 0.0–0.2)

## 2019-07-02 LAB — GLUCOSE, CAPILLARY
Glucose-Capillary: 201 mg/dL — ABNORMAL HIGH (ref 70–99)
Glucose-Capillary: 201 mg/dL — ABNORMAL HIGH (ref 70–99)
Glucose-Capillary: 147 mg/dL — ABNORMAL HIGH (ref 70–99)
Glucose-Capillary: 170 mg/dL — ABNORMAL HIGH (ref 70–99)
Glucose-Capillary: 152 mg/dL — ABNORMAL HIGH (ref 70–99)
Glucose-Capillary: 252 mg/dL — ABNORMAL HIGH (ref 70–99)

## 2019-07-02 LAB — BASIC METABOLIC PANEL
Anion gap: 12 (ref 5–15)
BUN: 16 mg/dL (ref 8–23)
CO2: 22 mmol/L (ref 22–32)
Calcium: 8.3 mg/dL — ABNORMAL LOW (ref 8.9–10.3)
Chloride: 102 mmol/L (ref 98–111)
Creatinine, Ser: 1.28 mg/dL — ABNORMAL HIGH (ref 0.61–1.24)
GFR calc Af Amer: 58 mL/min — ABNORMAL LOW (ref >60)
GFR calc non Af Amer: 50 mL/min — ABNORMAL LOW (ref >60)
Glucose, Bld: 210 mg/dL — ABNORMAL HIGH (ref 70–99)
Potassium: 3.1 mmol/L — ABNORMAL LOW (ref 3.5–5.1)
Sodium: 136 mmol/L (ref 135–145)

## 2019-07-02 LAB — MAGNESIUM: Magnesium: 1.6 mg/dL — ABNORMAL LOW (ref 1.7–2.4)

## 2019-07-02 SURGERY — AMPUTATION, ABOVE KNEE
Anesthesia: General | Site: Knee | Laterality: Left

## 2019-07-02 MED ORDER — DEXAMETHASONE SODIUM PHOSPHATE 10 MG/ML IJ SOLN
INTRAMUSCULAR | Status: AC
  Filled 2019-07-02: qty 1

## 2019-07-02 MED ORDER — ONDANSETRON HCL 4 MG/2ML IJ SOLN: 4.0000 mg | Freq: Once | INTRAMUSCULAR | Status: DC | PRN

## 2019-07-02 MED ORDER — ONDANSETRON HCL 4 MG/2ML IJ SOLN
INTRAMUSCULAR | Status: AC
  Filled 2019-07-02: qty 2

## 2019-07-02 MED ORDER — VERAPAMIL HCL 2.5 MG/ML IV SOLN
2.5000 mg | Freq: Once | INTRAVENOUS | Status: AC
  Administered 2019-07-02: 2.5 mg via INTRAVENOUS
  Filled 2019-07-02 (×2): qty 1

## 2019-07-02 MED ORDER — APIXABAN 5 MG PO TABS
5.0000 mg | ORAL_TABLET | Freq: Two times a day (BID) | ORAL | Status: DC
  Administered 2019-07-03 – 2019-07-09 (×13): 5 mg via ORAL
  Filled 2019-07-02 (×13): qty 1

## 2019-07-02 MED ORDER — LIDOCAINE 2% (20 MG/ML) 5 ML SYRINGE
INTRAMUSCULAR | Status: DC | PRN
  Administered 2019-07-02: 60 mg via INTRAVENOUS

## 2019-07-02 MED ORDER — LACTATED RINGERS IV SOLN: INTRAVENOUS | Status: DC

## 2019-07-02 MED ORDER — POTASSIUM CHLORIDE CRYS ER 20 MEQ PO TBCR
20.0000 meq | EXTENDED_RELEASE_TABLET | Freq: Every day | ORAL | Status: AC | PRN
  Administered 2019-07-03: 40 meq via ORAL
  Filled 2019-07-02: qty 2

## 2019-07-02 MED ORDER — EPHEDRINE 5 MG/ML INJ
INTRAVENOUS | Status: AC
  Filled 2019-07-02: qty 10

## 2019-07-02 MED ORDER — HYDROMORPHONE HCL 1 MG/ML IJ SOLN
0.2500 mg | INTRAMUSCULAR | Status: DC | PRN
  Administered 2019-07-02: 0.5 mg via INTRAVENOUS

## 2019-07-02 MED ORDER — HYDROMORPHONE HCL 1 MG/ML IJ SOLN
INTRAMUSCULAR | Status: AC
  Filled 2019-07-02: qty 1

## 2019-07-02 MED ORDER — HEPARIN SODIUM (PORCINE) 5000 UNIT/ML IJ SOLN: 5000.0000 [IU] | Freq: Three times a day (TID) | INTRAMUSCULAR | Status: DC

## 2019-07-02 MED ORDER — FENTANYL CITRATE (PF) 100 MCG/2ML IJ SOLN
INTRAMUSCULAR | Status: DC | PRN
  Administered 2019-07-02: 50 ug via INTRAVENOUS
  Administered 2019-07-02 (×3): 25 ug via INTRAVENOUS

## 2019-07-02 MED ORDER — SUGAMMADEX SODIUM 200 MG/2ML IV SOLN
INTRAVENOUS | Status: DC | PRN
  Administered 2019-07-02: 200 mg via INTRAVENOUS

## 2019-07-02 MED ORDER — FENTANYL CITRATE (PF) 250 MCG/5ML IJ SOLN
INTRAMUSCULAR | Status: AC
  Filled 2019-07-02: qty 5

## 2019-07-02 MED ORDER — DEXMEDETOMIDINE HCL IN NACL 200 MCG/50ML IV SOLN
INTRAVENOUS | Status: DC | PRN
  Administered 2019-07-02: 8 ug via INTRAVENOUS

## 2019-07-02 MED ORDER — ROCURONIUM BROMIDE 10 MG/ML (PF) SYRINGE
PREFILLED_SYRINGE | INTRAVENOUS | Status: AC
  Filled 2019-07-02: qty 10

## 2019-07-02 MED ORDER — PROPOFOL 10 MG/ML IV BOLUS
INTRAVENOUS | Status: DC | PRN
  Administered 2019-07-02: 80 mg via INTRAVENOUS
  Administered 2019-07-02: 20 mg via INTRAVENOUS

## 2019-07-02 MED ORDER — DEXAMETHASONE SODIUM PHOSPHATE 4 MG/ML IJ SOLN
INTRAMUSCULAR | Status: DC | PRN
  Administered 2019-07-02: 4 mg via INTRAVENOUS

## 2019-07-02 MED ORDER — ONDANSETRON HCL 4 MG/2ML IJ SOLN
INTRAMUSCULAR | Status: DC | PRN
  Administered 2019-07-02: 4 mg via INTRAVENOUS

## 2019-07-02 MED ORDER — VERAPAMIL HCL 2.5 MG/ML IV SOLN
2.5000 mg | Freq: Once | INTRAVENOUS | Status: AC
  Administered 2019-07-02: 2.5 mg via INTRAVENOUS
  Filled 2019-07-02: qty 1

## 2019-07-02 MED ORDER — ROCURONIUM BROMIDE 100 MG/10ML IV SOLN
INTRAVENOUS | Status: DC | PRN
  Administered 2019-07-02: 50 mg via INTRAVENOUS
  Administered 2019-07-02: 20 mg via INTRAVENOUS

## 2019-07-02 MED ORDER — SODIUM CHLORIDE 0.9 % IV SOLN
1.5000 g | INTRAVENOUS | Status: AC
  Administered 2019-07-02: 1.5 g via INTRAVENOUS
  Filled 2019-07-02: qty 1.5

## 2019-07-02 MED ORDER — PHENYLEPHRINE HCL-NACL 10-0.9 MG/250ML-% IV SOLN
INTRAVENOUS | Status: DC | PRN
  Administered 2019-07-02: 25 ug/min via INTRAVENOUS

## 2019-07-02 MED ORDER — ESMOLOL HCL 100 MG/10ML IV SOLN
INTRAVENOUS | Status: AC
  Filled 2019-07-02: qty 10

## 2019-07-02 MED ORDER — CEFAZOLIN SODIUM-DEXTROSE 2-4 GM/100ML-% IV SOLN
2.0000 g | Freq: Three times a day (TID) | INTRAVENOUS | Status: AC
  Administered 2019-07-02 – 2019-07-03 (×2): 2 g via INTRAVENOUS
  Filled 2019-07-02 (×3): qty 100

## 2019-07-02 MED ORDER — LIDOCAINE 2% (20 MG/ML) 5 ML SYRINGE
INTRAMUSCULAR | Status: AC
  Filled 2019-07-02: qty 5

## 2019-07-02 MED ORDER — 0.9 % SODIUM CHLORIDE (POUR BTL) OPTIME
TOPICAL | Status: DC | PRN
  Administered 2019-07-02: 12:00:00 1000 mL

## 2019-07-02 MED ORDER — ALUM & MAG HYDROXIDE-SIMETH 200-200-20 MG/5ML PO SUSP: 15.0000 mL | ORAL | Status: DC | PRN

## 2019-07-02 MED ORDER — MEPERIDINE HCL 25 MG/ML IJ SOLN: 6.2500 mg | INTRAMUSCULAR | Status: DC | PRN

## 2019-07-02 SURGICAL SUPPLY — 53 items
BLADE SAW GIGLI 510 (BLADE) ×2 IMPLANT
BLADE SAW GIGLI 510MM (BLADE) ×1
BNDG COHESIVE 6X5 TAN STRL LF (GAUZE/BANDAGES/DRESSINGS) ×5 IMPLANT
BNDG ELASTIC 4X5.8 VLCR STR LF (GAUZE/BANDAGES/DRESSINGS) ×3 IMPLANT
BNDG ELASTIC 6X5.8 VLCR STR LF (GAUZE/BANDAGES/DRESSINGS) ×3 IMPLANT
BNDG GAUZE ELAST 4 BULKY (GAUZE/BANDAGES/DRESSINGS) ×4 IMPLANT
CANISTER SUCT 3000ML PPV (MISCELLANEOUS) ×3 IMPLANT
CLIP VESOCCLUDE MED 6/CT (CLIP) ×3 IMPLANT
COVER SURGICAL LIGHT HANDLE (MISCELLANEOUS) ×3 IMPLANT
COVER WAND RF STERILE (DRAPES) ×1 IMPLANT
DRAIN CHANNEL 19F RND (DRAIN) IMPLANT
DRAPE HALF SHEET 40X57 (DRAPES) ×3 IMPLANT
DRAPE INCISE IOBAN 66X45 STRL (DRAPES) IMPLANT
DRAPE ORTHO SPLIT 77X108 STRL (DRAPES) ×4
DRAPE SURG ORHT 6 SPLT 77X108 (DRAPES) ×2 IMPLANT
DRESSING PREVENA PLUS CUSTOM (GAUZE/BANDAGES/DRESSINGS) IMPLANT
DRSG ADAPTIC 3X8 NADH LF (GAUZE/BANDAGES/DRESSINGS) ×3 IMPLANT
DRSG PREVENA PLUS CUSTOM (GAUZE/BANDAGES/DRESSINGS)
ELECT CAUTERY BLADE 6.4 (BLADE) ×3 IMPLANT
ELECT REM PT RETURN 9FT ADLT (ELECTROSURGICAL) ×3
ELECTRODE REM PT RTRN 9FT ADLT (ELECTROSURGICAL) ×1 IMPLANT
EVACUATOR SILICONE 100CC (DRAIN) IMPLANT
GAUZE 4X4 16PLY RFD (DISPOSABLE) ×3 IMPLANT
GAUZE SPONGE 4X4 12PLY STRL (GAUZE/BANDAGES/DRESSINGS) ×4 IMPLANT
GAUZE XEROFORM 5X9 LF (GAUZE/BANDAGES/DRESSINGS) ×2 IMPLANT
GLOVE BIOGEL PI IND STRL 7.5 (GLOVE) ×1 IMPLANT
GLOVE BIOGEL PI INDICATOR 7.5 (GLOVE) ×2
GLOVE ECLIPSE 6.5 STRL STRAW (GLOVE) ×2 IMPLANT
GLOVE SURG SS PI 7.5 STRL IVOR (GLOVE) ×3 IMPLANT
GOWN STRL REUS W/ TWL LRG LVL3 (GOWN DISPOSABLE) ×2 IMPLANT
GOWN STRL REUS W/ TWL XL LVL3 (GOWN DISPOSABLE) ×1 IMPLANT
GOWN STRL REUS W/TWL LRG LVL3 (GOWN DISPOSABLE) ×4
GOWN STRL REUS W/TWL XL LVL3 (GOWN DISPOSABLE) ×2
KIT BASIN OR (CUSTOM PROCEDURE TRAY) ×3 IMPLANT
KIT TURNOVER KIT B (KITS) ×3 IMPLANT
NS IRRIG 1000ML POUR BTL (IV SOLUTION) ×3 IMPLANT
PACK GENERAL/GYN (CUSTOM PROCEDURE TRAY) ×3 IMPLANT
PAD ARMBOARD 7.5X6 YLW CONV (MISCELLANEOUS) ×6 IMPLANT
PREVENA RESTOR ARTHOFORM 46X30 (CANNISTER) IMPLANT
STAPLER VISISTAT 35W (STAPLE) ×5 IMPLANT
STOCKINETTE IMPERVIOUS LG (DRAPES) ×3 IMPLANT
SUT ETHILON 3 0 PS 1 (SUTURE) IMPLANT
SUT PROLENE 5 0 C 1 24 (SUTURE) ×2 IMPLANT
SUT SILK 0 TIES 10X30 (SUTURE) ×3 IMPLANT
SUT SILK 2 0 (SUTURE)
SUT SILK 2-0 18XBRD TIE 12 (SUTURE) IMPLANT
SUT SILK 3 0 (SUTURE)
SUT SILK 3-0 18XBRD TIE 12 (SUTURE) IMPLANT
SUT VIC AB 2-0 CT1 18 (SUTURE) ×6 IMPLANT
TAPE UMBILICAL COTTON 1/8X30 (MISCELLANEOUS) IMPLANT
TOWEL GREEN STERILE (TOWEL DISPOSABLE) ×3 IMPLANT
UNDERPAD 30X30 (UNDERPADS AND DIAPERS) ×3 IMPLANT
WATER STERILE IRR 1000ML POUR (IV SOLUTION) ×3 IMPLANT

## 2019-07-02 NOTE — Progress Notes (Signed)
    Subjective  -   No acute overnight events   Physical Exam:  Wound is not viable        Assessment/Plan:    Discussed with patient and granddaughter that we need to proceed with above knee amputation.  Everyone is in agreement.  We will proceed today  [name] 07/02/2019 7:49 AM --  Vitals:   07/02/19 0527 07/02/19 0557  BP:    Pulse: 96   Resp:    Temp:  98.1 F (36.7 C)  SpO2: 99%     Intake/Output Summary (Last 24 hours) at 07/02/2019 0749 Last data filed at 07/01/2019 2222 Gross per 24 hour  Intake 610 ml  Output 525 ml  Net 85 ml     Laboratory CBC    Component Value Date/Time   WBC 11.6 (H) 06/30/2019 0255   HGB 10.8 (L) 06/30/2019 0255   HCT 33.4 (L) 06/30/2019 0255   PLT 379 06/30/2019 0255    BMET    Component Value Date/Time   NA 136 06/30/2019 0255   NA 138 11/03/2017 0000   K 3.3 (L) 06/30/2019 0255   CL 100 06/30/2019 0255   CO2 25 06/30/2019 0255   GLUCOSE 176 (H) 06/30/2019 0255   BUN 20 06/30/2019 0255   BUN 23 (A) 11/03/2017 0000   CREATININE 1.46 (H) 06/30/2019 0255   CALCIUM 8.8 (L) 06/30/2019 0255   GFRNONAA 43 (L) 06/30/2019 0255   GFRAA 49 (L) 06/30/2019 0255    COAG Lab Results  Component Value Date   INR 1.1 06/16/2019   INR 1.06 12/28/2009   No results found for: PTT  Antibiotics Anti-infectives (From admission, onward)   Start     Dose/Rate Route Frequency Ordered Stop   06/30/19 0000  ceFAZolin (ANCEF) IVPB 1 g/50 mL premix  Status:  Discontinued    Note to Pharmacy: Send with pt to OR   1 g 100 mL/hr over 30 Minutes Intravenous On call 06/29/19 1531 06/29/19 1755   06/30/19 0000  ceFAZolin (ANCEF) IVPB 2g/100 mL premix    Note to Pharmacy: Send with pt to OR   2 g 200 mL/hr over 30 Minutes Intravenous On call 06/29/19 1755 06/30/19 1257   06/26/19 1400  ceFAZolin (ANCEF) IVPB 2g/100 mL premix     2 g 200 mL/hr over 30 Minutes Intravenous Every 8 hours 06/26/19 0855 06/26/19 2132   06/25/19 2000   ceFAZolin (ANCEF) IVPB 2g/100 mL premix  Status:  Discontinued     2 g 200 mL/hr over 30 Minutes Intravenous Every 8 hours 06/25/19 1851 06/26/19 0849       V. Charlena Cross, M.D., [facility] [place] Office: [phone] Pager:  [phone]

## 2019-07-02 NOTE — Progress Notes (Signed)
Called by PACU with HR 130-150.  He has history of afib.  He got 2 doses of verapamil.  His SBP is 100.  Will give a fluid challenge first with one liter saline.  BP is too low for diltiazem if not fixed with fluid will consider amiodarone drip.  May need Cardiology consult for assistance in rate control if not improved soon   Check CBC Check lytes Calcium Mag Check troponin 12 lead EKG  Fabienne Bruns, MD [facility] [place] Office: [phone]

## 2019-07-02 NOTE — Anesthesia Procedure Notes (Addendum)
Procedure Name: Intubation Date/Time: 07/02/2019 12:59 PM Performed by: Scheryl Darter, CRNA Pre-anesthesia Checklist: Patient identified, Emergency Drugs available, Suction available and Patient being monitored Patient Re-evaluated:Patient Re-evaluated prior to induction Oxygen Delivery Method: Circle System Utilized Preoxygenation: Pre-oxygenation with 100% oxygen Induction Type: IV induction Ventilation: Mask ventilation without difficulty Laryngoscope Size: Mac and 4 Grade View: Grade I Tube type: Oral Tube size: 7.0 mm Number of attempts: 1 Airway Equipment and Method: Stylet and Oral airway Placement Confirmation: ETT inserted through vocal cords under direct vision,  positive ETCO2 and breath sounds checked- equal and bilateral Secured at: 23 cm Tube secured with: Tape Dental Injury: Teeth and Oropharynx as per pre-operative assessment

## 2019-07-02 NOTE — Progress Notes (Signed)
Nurse Had orders to give a liter bolus of Normal Saline and then call MD Fields.  Bolus is complete and Nurse notified MD Fields. HR is 120-140. No new orders received. MD is ok with patient returning the floor.

## 2019-07-02 NOTE — Transfer of Care (Signed)
Immediate Anesthesia Transfer of Care Note  Patient: Warren Mcdonald  Procedure(s) Performed: left AMPUTATION ABOVE KNEE (Left Knee)  Patient Location: PACU  Anesthesia Type:General  Level of Consciousness: drowsy  Airway & Oxygen Therapy: Patient Spontanous Breathing and Patient connected to nasal cannula oxygen  Post-op Assessment: Report given to RN and Post -op Vital signs reviewed and stable  Post vital signs: Reviewed and stable  Last Vitals:  Vitals Value Taken Time  BP 122/78 07/02/19 1436  Temp    Pulse 98 07/02/19 1442  Resp 21 07/02/19 1442  SpO2 95 % 07/02/19 1442  Vitals shown include unvalidated device data.  Last Pain:  Vitals:   07/02/19 1200  TempSrc:   PainSc: 0-No pain      Patients Stated Pain Goal: 3 (07/02/19 1200)  Complications: No apparent anesthesia complications

## 2019-07-02 NOTE — Interval H&P Note (Signed)
History and Physical Interval Note:  07/02/2019 12:42 PM  Warren Mcdonald  has presented today for surgery, with the diagnosis of Ischemic left leg.  The various methods of treatment have been discussed with the patient and family. After consideration of risks, benefits and other options for treatment, the patient has consented to  Procedure(s): AMPUTATION ABOVE KNEE (Left) as a surgical intervention.  The patient's history has been reviewed, patient examined, no change in status, stable for surgery.  I have reviewed the patient's chart and labs.  Questions were answered to the patient's satisfaction.     Gretta Began

## 2019-07-02 NOTE — Op Note (Signed)
    OPERATIVE REPORT  DATE OF SURGERY: 07/02/2019  PATIENT: Warren Mcdonald, 86 y.o. male MRN: [id number]  DOB: 1929-09-26  PRE-OPERATIVE DIAGNOSIS: Gangrene left foot  POST-OPERATIVE DIAGNOSIS:  Same  PROCEDURE: Left above-knee amputation  SURGEON:  Gretta Began, M.D.  PHYSICIAN ASSISTANT: Darlin Coco, PA-C  ANESTHESIA: General  EBL: per anesthesia record  Total I/O In: 400 [I.V.:400] Out: -   BLOOD ADMINISTERED: none  DRAINS: none  SPECIMEN: none  COUNTS CORRECT:  YES  PATIENT DISPOSITION:  PACU - hemodynamically stable  PROCEDURE DETAILS: Patient was taken operating placed to cover for the area of the left leg prepped draped in sterile fashion.  The fishmouth incision was made on the distal thigh and carried down through the subcutaneous fat and muscle to the level of the femur.  The femoral vein was ligated and divided.  The superficial femoral artery was ligated and divided.  The supra femoral artery was patent at this level and therefore it was oversewn with a 5-0 Prolene suture.  The sciatic nerve was occluded more proximally with a Coker clamp and divided and was ligated.  The periosteum was elevated off the femur.  The femur was divided approximately 4th-5 centimeters proximal to the skin incision with a Gigli saw.  The bone edges were smoothed with a bone rasp.  The wound was irrigated with saline and hemostasis to electrocautery.  The anterior fascia was closed to the posterior fascia with interrupted 0 Vicryl figure-of-eight sutures.  The wound again was irrigated with saline.  The wound was closed with skin staples.  Iodoform gauze and Curlex and Ace wrap were placed and the patient was transferred to the recovery room in stable condition   Larina Earthly, M.D., [facility] 07/02/2019 2:40 PM

## 2019-07-02 NOTE — Anesthesia Preprocedure Evaluation (Signed)
Anesthesia Evaluation  Patient identified by MRN, date of birth, ID band Patient awake    Reviewed: Allergy & Precautions, NPO status , Patient's Chart, lab work & pertinent test results  Airway Mallampati: III  TM Distance: >3 FB Neck ROM: Full    Dental  (+) Missing, Poor Dentition,    Pulmonary former smoker,    Pulmonary exam normal breath sounds clear to auscultation       Cardiovascular hypertension, Pt. on medications and Pt. on home beta blockers + Peripheral Vascular Disease and +CHF  Normal cardiovascular exam+ dysrhythmias Atrial Fibrillation  Rhythm:Irregular Rate:Normal  ECG: a-fib  Case cx 2/3 dur to HR 130-140s   Neuro/Psych negative neurological ROS  negative psych ROS   GI/Hepatic negative GI ROS, Neg liver ROS,   Endo/Other  diabetes, Oral Hypoglycemic Agents  Renal/GU negative Renal ROSK+ 3.3 Cr 1.46     Musculoskeletal negative musculoskeletal ROS (+)   Abdominal   Peds  Hematology  (+) anemia , Hgb 10.8 Plt 379   Anesthesia Other Findings NON HEALING WOUND LEFT FOOT  Reproductive/Obstetrics                            Anesthesia Physical  Anesthesia Plan  ASA: IV  Anesthesia Plan: General   Post-op Pain Management:    Induction: Intravenous  PONV Risk Score and Plan: 1 and Ondansetron, Treatment may vary due to age or medical condition and Propofol infusion  Airway Management Planned: Oral ETT  Additional Equipment: None  Intra-op Plan:   Post-operative Plan:   Informed Consent: I have reviewed the patients History and Physical, chart, labs and discussed the procedure including the risks, benefits and alternatives for the proposed anesthesia with the patient or authorized representative who has indicated his/her understanding and acceptance.     Dental advisory given  Plan Discussed with: CRNA  Anesthesia Plan Comments: (A-fib with RVR.)         Anesthesia Quick Evaluation

## 2019-07-02 NOTE — Progress Notes (Addendum)
Pt's alert and oriented x 4. Pt had a couple times of large amount of clear jelly like mucus stool. RN day shift reported Pt had constipation, no bowel movement for 4-5 day. Pt got Dulcolax, Colace and Miralax on day shift and last night. Denied abdominal pain, no nausea and vomiting and he had a good appetite. No abdominal distension, no rebound tenderness.   His vital signs stable, Temp 98.1 F and remained afebrile. EKG remained Atrial fibrillation per Pt base line. HR 70s-110s, BP 112/66- 115/ 64 mmHg, SPO2 97-98% on room air. No evident of immediate distress tonight.  His left foot opened wound had serosanguinous drainage with dark purple and dark red granulation tissue. Dressing changed with moist to dry gauzes packed, covered with ABD pad, Kirlex and elastic bandage wrapped. Pain tolerated well, Percocet given. Doppler detected faint pulses on left and right  Dorsalis Pedis pulse and stronger signals on left and right posterior Tibial pulses.  He's able to rest well tonight.   NPO after midnight, possibly go to OR for amputation at am per MD noted.   Will continue to monitor.  Filiberto Pinks, RN

## 2019-07-02 NOTE — H&P (View-Only) (Signed)
    Subjective  -   No acute overnight events   Physical Exam:  Wound is not viable        Assessment/Plan:    Discussed with patient and granddaughter that we need to proceed with above knee amputation.  Everyone is in agreement.  We will proceed today  Warren Mcdonald 07/02/2019 7:49 AM --  Vitals:   07/02/19 0527 07/02/19 0557  BP:    Pulse: 96   Resp:    Temp:  98.1 F (36.7 C)  SpO2: 99%     Intake/Output Summary (Last 24 hours) at 07/02/2019 0749 Last data filed at 07/01/2019 2222 Gross per 24 hour  Intake 610 ml  Output 525 ml  Net 85 ml     Laboratory CBC    Component Value Date/Time   WBC 11.6 (H) 06/30/2019 0255   HGB 10.8 (L) 06/30/2019 0255   HCT 33.4 (L) 06/30/2019 0255   PLT 379 06/30/2019 0255    BMET    Component Value Date/Time   NA 136 06/30/2019 0255   NA 138 11/03/2017 0000   K 3.3 (L) 06/30/2019 0255   CL 100 06/30/2019 0255   CO2 25 06/30/2019 0255   GLUCOSE 176 (H) 06/30/2019 0255   BUN 20 06/30/2019 0255   BUN 23 (A) 11/03/2017 0000   CREATININE 1.46 (H) 06/30/2019 0255   CALCIUM 8.8 (L) 06/30/2019 0255   GFRNONAA 43 (L) 06/30/2019 0255   GFRAA 49 (L) 06/30/2019 0255    COAG Lab Results  Component Value Date   INR 1.1 06/16/2019   INR 1.06 12/28/2009   No results found for: PTT  Antibiotics Anti-infectives (From admission, onward)   Start     Dose/Rate Route Frequency Ordered Stop   06/30/19 0000  ceFAZolin (ANCEF) IVPB 1 g/50 mL premix  Status:  Discontinued    Note to Pharmacy: Send with pt to OR   1 g 100 mL/hr over 30 Minutes Intravenous On call 06/29/19 1531 06/29/19 1755   06/30/19 0000  ceFAZolin (ANCEF) IVPB 2g/100 mL premix    Note to Pharmacy: Send with pt to OR   2 g 200 mL/hr over 30 Minutes Intravenous On call 06/29/19 1755 06/30/19 1257   06/26/19 1400  ceFAZolin (ANCEF) IVPB 2g/100 mL premix     2 g 200 mL/hr over 30 Minutes Intravenous Every 8 hours 06/26/19 0855 06/26/19 2132   06/25/19 2000   ceFAZolin (ANCEF) IVPB 2g/100 mL premix  Status:  Discontinued     2 g 200 mL/hr over 30 Minutes Intravenous Every 8 hours 06/25/19 1851 06/26/19 0849       V. Warren Lurena Naeve IV, M.D., FACS [facility] [place] Office: [phone] Pager:  [phone] 

## 2019-07-03 ENCOUNTER — Encounter (HOSPITAL_COMMUNITY): Payer: Self-pay | Admitting: Vascular Surgery

## 2019-07-03 ENCOUNTER — Inpatient Hospital Stay (HOSPITAL_COMMUNITY): Payer: Medicare Other

## 2019-07-03 DIAGNOSIS — I4891 Unspecified atrial fibrillation: Secondary | ICD-10-CM

## 2019-07-03 DIAGNOSIS — E119 Type 2 diabetes mellitus without complications: Secondary | ICD-10-CM

## 2019-07-03 DIAGNOSIS — I340 Nonrheumatic mitral (valve) insufficiency: Secondary | ICD-10-CM

## 2019-07-03 DIAGNOSIS — I5032 Chronic diastolic (congestive) heart failure: Secondary | ICD-10-CM

## 2019-07-03 DIAGNOSIS — I480 Paroxysmal atrial fibrillation: Secondary | ICD-10-CM | POA: Diagnosis present

## 2019-07-03 DIAGNOSIS — I10 Essential (primary) hypertension: Secondary | ICD-10-CM

## 2019-07-03 DIAGNOSIS — I361 Nonrheumatic tricuspid (valve) insufficiency: Secondary | ICD-10-CM

## 2019-07-03 LAB — BASIC METABOLIC PANEL
Anion gap: 15 (ref 5–15)
BUN: 19 mg/dL (ref 8–23)
CO2: 22 mmol/L (ref 22–32)
Calcium: 8.3 mg/dL — ABNORMAL LOW (ref 8.9–10.3)
Chloride: 98 mmol/L (ref 98–111)
Creatinine, Ser: 1.37 mg/dL — ABNORMAL HIGH (ref 0.61–1.24)
GFR calc Af Amer: 53 mL/min — ABNORMAL LOW (ref >60)
GFR calc non Af Amer: 46 mL/min — ABNORMAL LOW (ref >60)
Glucose, Bld: 264 mg/dL — ABNORMAL HIGH (ref 70–99)
Potassium: 3.3 mmol/L — ABNORMAL LOW (ref 3.5–5.1)
Sodium: 135 mmol/L (ref 135–145)

## 2019-07-03 LAB — CBC
HCT: 30.5 % — ABNORMAL LOW (ref 39.0–52.0)
Hemoglobin: 10 g/dL — ABNORMAL LOW (ref 13.0–17.0)
MCH: 29.6 pg (ref 26.0–34.0)
MCHC: 32.8 g/dL (ref 30.0–36.0)
MCV: 90.2 fL (ref 80.0–100.0)
Platelets: 383 10*3/uL (ref 150–400)
RBC: 3.38 MIL/uL — ABNORMAL LOW (ref 4.22–5.81)
RDW: 13.4 % (ref 11.5–15.5)
WBC: 11.2 10*3/uL — ABNORMAL HIGH (ref 4.0–10.5)
nRBC: 0 % (ref 0.0–0.2)

## 2019-07-03 LAB — GLUCOSE, CAPILLARY
Glucose-Capillary: 246 mg/dL — ABNORMAL HIGH (ref 70–99)
Glucose-Capillary: 245 mg/dL — ABNORMAL HIGH (ref 70–99)
Glucose-Capillary: 223 mg/dL — ABNORMAL HIGH (ref 70–99)
Glucose-Capillary: 213 mg/dL — ABNORMAL HIGH (ref 70–99)

## 2019-07-03 LAB — ECHOCARDIOGRAM COMPLETE
Height: 73 in
Weight: 3439.18 oz

## 2019-07-03 MED ORDER — POTASSIUM CHLORIDE CRYS ER 20 MEQ PO TBCR
40.0000 meq | EXTENDED_RELEASE_TABLET | Freq: Every day | ORAL | Status: DC
  Administered 2019-07-04 – 2019-07-09 (×6): 40 meq via ORAL
  Filled 2019-07-03 (×6): qty 2

## 2019-07-03 MED ORDER — METOPROLOL TARTRATE 25 MG PO TABS
25.0000 mg | ORAL_TABLET | Freq: Four times a day (QID) | ORAL | Status: DC
  Administered 2019-07-03 – 2019-07-04 (×4): 25 mg via ORAL
  Filled 2019-07-03 (×4): qty 1

## 2019-07-03 NOTE — Progress Notes (Signed)
  Echocardiogram 2D Echocardiogram has been performed.  Warren Mcdonald 07/03/2019, 4:23 PM

## 2019-07-03 NOTE — Progress Notes (Addendum)
ANTICOAGULATION CONSULT NOTE - Initial Consult  Pharmacy Consult for Apixaban Indication: atrial fibrillation  No Known Allergies  Patient Measurements: Height: 6\' 1"  (185.4 cm) Weight: 214 lb 15.2 oz (97.5 kg) IBW/kg (Calculated) : 79.9  Vital Signs: Temp: 98.3 F (36.8 C) (02/06 0846) Temp Source: Oral (02/06 0846) BP: 127/77 (02/06 0846) Pulse Rate: 109 (02/06 0846)  Labs: Recent Labs    07/02/19 1807 07/03/19 0256  HGB 10.0* 10.0*  HCT 31.6* 30.5*  PLT 374 383  CREATININE 1.28* 1.37*  TROPONINIHS 8  --     Estimated Creatinine Clearance: 46.7 mL/min (A) (by C-G formula based on SCr of 1.37 mg/dL (H)).   Medical History: Past Medical History:  Diagnosis Date  . Abdominal distension (gaseous)   . Anemia, unspecified   . Anemia, unspecified   . Chronic diastolic (congestive) heart failure (HCC)   . Chronic diastolic (congestive) heart failure (HCC)   . Essential (primary) hypertension   . Essential hypertension   . Hyperlipidemia   . Hyperlipidemia, unspecified   . Peripheral vascular disease (HCC) 2011   Left above-knee popliteal to posterior tibial artery bypass   . Peripheral vascular disease, unspecified (HCC)   . PUD (peptic ulcer disease)   . Type 2 diabetes mellitus (HCC)   . Type 2 diabetes mellitus with diabetic peripheral angiopathy without gangrene (HCC)   . Type 2 diabetes mellitus with diabetic peripheral angiopathy without gangrene (HCC)   . Type 2 diabetes mellitus without complications (HCC)   . Unspecified atrial fibrillation (HCC)     Medications:  Scheduled:  . amLODipine  10 mg Oral Daily  . apixaban  5 mg Oral BID  . aspirin EC  81 mg Oral Daily  . docusate sodium  100 mg Oral Daily  . insulin aspart  0-15 Units Subcutaneous TID WC  . loratadine  10 mg Oral Daily  . metoprolol tartrate  25 mg Oral BID  . pantoprazole  40 mg Oral Daily   Infusions:  . magnesium sulfate bolus IVPB      Assessment: Pt is a 86 y/o male who has  PMH significant for type 2 diabetes mellitus, hyperlipidemia, hypertension, and PAD. Pt is admitted for gangrenous left foot. He has undergone left AKA successfully. Pt has afib, for which he was on apixaban PTA. Pharmacy has been consulted to restart Apixaban.  Pt not on anticoagulation PTA. Wt is 97.5, Age is 86, and Scr is 1.37. Hg is low but stable at 10. Plts are wnls.   Goal of Therapy:  Monitor platelets by anticoagulation protocol: Yes   Plan:  Apixaban 5 mg PO BID Monitor CBC periodically Watch Scr for dose adjustments Monitor for bleeding.  97, PharmD PGY1 Acute Care Pharmacy Resident 07/03/2019,9:19 AM

## 2019-07-03 NOTE — Consult Note (Addendum)
Admit date: 06/25/2019 Referring Physician  Harold Barban, MD Primary Physician  Sinda Du, MD Primary Cardiologist  Carlyle Dolly, MD Reason for Consultation  Atrial fibrillation with RVR  HPI: Warren Mcdonald is a 86 y.o. male who is being seen today for the evaluation of afib with RVR at the request of Harold Barban, MD.  This is a 86yo male with a hx of PAF with CHADS2VASC score of 5 on chronic anticoagulation, chronic diastolic CHF and HTN.  He has an extensive hx of PAD s/p left above-knee popliteal to posterior tibial artery bypass graft in 2011 and angioplasty of occluded right tibioperoneal trunk and posterior tibial artery in 2010 followed by Vascular, DM2, HLD. He has recently had several hospital admissions for vascular complications.   Admitted 05/24/19 with critical LLE ischemia and toe ulceration and underwent aortogram with balloon angioplasty of the left posterior tibial artery and bypass graft for graft stenosis and nonhealing 4th toe wound. He underwent left 4th toe amputation on 06/16/2019 for gangrenous toe.  He was readmitted on 06/25/2019 due to nonhealing left fourth toe amputation site.  He underwent open transmetatarsal amputation of left 3-5th toes on 06/26/2019.  Yesterday he underwent left AKA for gangrene of the left foot.  Noted in PACU to have HRs in the 130-150's and was given verapamil x 2 doses.  This am HR in the 120-170bpm range and hypotensive post op with SBP as low as 100's.  He has a hx of PAF in the past and was on Eliquis which was restarted today.  Cardiology is now asked to consult for assistance with management of afib.   He denies any anginal chest pain or pressure, , dizziness, palpitations or syncope. He has chronic DOE which is stable.  Labs this am show a K+ of 3.3 which is being repleated and Hbg 10.      PMH:   Past Medical History:  Diagnosis Date  . Abdominal distension (gaseous)   . Anemia, unspecified   . Chronic diastolic  (congestive) heart failure ([place])   . Essential (primary) hypertension   . Hyperlipidemia   . Peripheral vascular disease ([place]) 2011   Left above-knee popliteal to posterior tibial artery bypass   . PUD (peptic ulcer disease)   . Type 2 diabetes mellitus with diabetic peripheral angiopathy without gangrene (HCC)      PSH:   Past Surgical History:  Procedure Laterality Date  . ABDOMINAL AORTOGRAM N/A 05/24/2019   Procedure: ABDOMINAL AORTOGRAM;  Surgeon: Waynetta Sandy, MD;  Location: [facility];  Service: Cardiovascular;  Laterality: N/A;  . AMPUTATION Left 06/16/2019   Procedure: AMPUTATION DIGIT FOURTH TOE LEFT FOOT;  Surgeon: Serafina Mitchell, MD;  Location: [facility];  Service: Vascular;  Laterality: Left;  . AMPUTATION Left 06/26/2019   Procedure: AMPUTATION two DIGITs, left toe;  Surgeon: Waynetta Sandy, MD;  Location: [facility];  Service: Vascular;  Laterality: Left;  . APPENDECTOMY    . CATARACT EXTRACTION W/ INTRAOCULAR LENS  IMPLANT, BILATERAL    . CHOLECYSTECTOMY N/A 09/08/2014   Procedure: CHOLECYSTECTOMY;  Surgeon: Aviva Signs Md, MD;  Location: [facility];  Service: General;  Laterality: N/A;  . EYE SURGERY    . I & D EXTREMITY Left 06/30/2019   Procedure: sharp incisonal DEBRIDEMENT of left foot including bone, tendon, muscle, and skin;  Surgeon: Marty Heck, MD;  Location: [facility];  Service: Vascular;  Laterality: Left;  . LOWER EXTREMITY ANGIOGRAPHY Left 05/24/2019  Procedure: Lower Extremity Angiography;  Surgeon: Maeola Harman, MD;  Location: [facility];  Service: Cardiovascular;  Laterality: Left;  . PERIPHERAL VASCULAR BALLOON ANGIOPLASTY Left 05/24/2019   Procedure: PERIPHERAL VASCULAR BALLOON ANGIOPLASTY;  Surgeon: Maeola Harman, MD;  Location: [facility];  Service: Cardiovascular;  Laterality: Left;  tibial bypass  . PR VEIN BYPASS GRAFT,AORTO-FEM-POP     Left above knee popliteal to posterior tibial  artery bypass graft    Allergies:  Patient has no known allergies. Prior to Admit Meds:   Medications Prior to Admission  Medication Sig Dispense Refill Last Dose  . amLODipine (NORVASC) 10 MG tablet Take 1 tablet (10 mg total) by mouth daily. 7 tablet 0 06/25/2019 at Unknown time  . apixaban (ELIQUIS) 5 MG TABS tablet Take 1 tablet (5 mg total) by mouth 2 (two) times daily. 60 tablet 0 06/25/2019 at 1100  . aspirin EC 81 MG tablet Take 81 mg by mouth daily.   06/25/2019 at Unknown time  . bisacodyl (DULCOLAX) 5 MG EC tablet Take 5 mg by mouth daily as needed for moderate constipation.   06/24/2019 at Unknown time  . cetirizine (ZYRTEC) 10 MG tablet Take 10 mg by mouth at bedtime.   06/24/2019 at Unknown time  . latanoprost (XALATAN) 0.005 % ophthalmic solution 1 drop at bedtime.   06/24/2019 at Unknown time  . lubiprostone (AMITIZA) 24 MCG capsule Take 24 mcg by mouth 2 (two) times daily with a meal.   unk  . metFORMIN (GLUCOPHAGE) 500 MG tablet Take 500 mg by mouth 2 (two) times daily with a meal.     06/25/2019 at Unknown time  . metoprolol tartrate (LOPRESSOR) 25 MG tablet Take 25 mg by mouth 2 (two) times daily.   11 06/25/2019 at 1100  . traMADol (ULTRAM) 50 MG tablet Take 50 mg by mouth every 6 (six) hours as needed.   unk   Fam HX:    Family History  Problem Relation Age of Onset  . Heart disease Mother   . Hypertension Mother   . Heart attack Mother   . Heart attack Father   . Heart disease Father        Heart disease before age 75  . Cancer Sister   . Deep vein thrombosis Sister   . Diabetes Sister        Amputation-right leg  . Cancer Daughter   . Diabetes Daughter   . Cancer Brother   . Diabetes Other   . Prostate cancer Other    Social HX:    Social History   Socioeconomic History  . Marital status: Married    Spouse name: Not on file  . Number of children: Not on file  . Years of education: Not on file  . Highest education level: Not on file  Occupational History    . Not on file  Tobacco Use  . Smoking status: Former Smoker    Types: Cigars    Quit date: 05/27/1988    Years since quitting: 31.1  . Smokeless tobacco: Former Neurosurgeon    Types: Chew    Quit date: 05/27/1988  Substance and Sexual Activity  . Alcohol use: No    Alcohol/week: 0.0 standard drinks  . Drug use: No  . Sexual activity: Not on file  Other Topics Concern  . Not on file  Social History Narrative  . Not on file   Social Determinants of Health   Financial Resource Strain:   .  Difficulty of Paying Living Expenses: Not on file  Food Insecurity:   . Worried About Programme researcher, broadcasting/film/video in the Last Year: Not on file  . Ran Out of Food in the Last Year: Not on file  Transportation Needs:   . Lack of Transportation (Medical): Not on file  . Lack of Transportation (Non-Medical): Not on file  Physical Activity:   . Days of Exercise per Week: Not on file  . Minutes of Exercise per Session: Not on file  Stress:   . Feeling of Stress : Not on file  Social Connections:   . Frequency of Communication with Friends and Family: Not on file  . Frequency of Social Gatherings with Friends and Family: Not on file  . Attends Religious Services: Not on file  . Active Member of Clubs or Organizations: Not on file  . Attends Banker Meetings: Not on file  . Marital Status: Not on file  Intimate Partner Violence:   . Fear of Current or Ex-Partner: Not on file  . Emotionally Abused: Not on file  . Physically Abused: Not on file  . Sexually Abused: Not on file     ROS:  All  ROS were addressed and are negative except what is stated in the HPI  Physical Exam: Blood pressure 127/77, pulse (!) 109, temperature 98.3 F (36.8 C), temperature source Oral, resp. rate 14, height 6\' 1"  (1.854 m), weight 97.5 kg, SpO2 100 %.    General: Well developed, well nourished, in no acute distress Head: Eyes PERRLA, No xanthomas.   Normal cephalic and atramatic  Lungs:   Clear bilaterally to  auscultation and percussion. Heart:   H irregularly irregular S1 S2 Pulses are 2+ & equal.            No carotid bruit. No JVD.  No abdominal bruits. No femoral bruits. Abdomen: Bowel sounds are positive, abdomen soft and non-tender without masses or                  Hernia's noted. Msk:  Back normal, normal gait. Normal strength and tone for age. Extremities:   No clubbing, cyanosis or edema of RLE.  S/P left AKA Neuro: Alert and oriented X 3. Psych:  Good affect, responds appropriately    Labs:   Lab Results  Component Value Date   WBC 11.2 (H) 07/03/2019   HGB 10.0 (L) 07/03/2019   HCT 30.5 (L) 07/03/2019   MCV 90.2 07/03/2019   PLT 383 07/03/2019    Recent Labs  Lab 07/03/19 0256  NA 135  K 3.3*  CL 98  CO2 22  BUN 19  CREATININE 1.37*  CALCIUM 8.3*  GLUCOSE 264*   No results found for: PTT Lab Results  Component Value Date   INR 1.1 06/16/2019   INR 1.06 12/28/2009   Lab Results  Component Value Date   TROPONINI 0.57 (HH) 09/05/2014     Lab Results  Component Value Date   CHOL 133 10/22/2016   Lab Results  Component Value Date   HDL 44 10/22/2016   Lab Results  Component Value Date   LDLCALC 69 10/22/2016   Lab Results  Component Value Date   TRIG 100 10/22/2016   No results found for: CHOLHDL No results found for: LDLDIRECT    Radiology:  No results found.   Telemetry    Atrial fibrillation with RVR in the 130's.  There was a 30 beat run of VT on tele from 2/5 -  Personally Reviewed  ECG    Atrial fibrillation at 133bpm with septal infarct and IRBBB - Personally Reviewed   ASSESSMENT/PLAN:    1.  Atrial fibrillation with RVR -he has a hx of PAF In the past on Eliquis for a CHADS2VASC score of 6 -he has been off and on anticoagulation recently due to multiple surgeries for his gangrenous toe -per review of EKGs he has been in afib since 04/2019 - last EKG in [facility] was 2018 so would assume at this time he has chronic afib. -given  advanced age, would recommend at this time pursuing rate control since he is fairly asymptomatic -will check 2D echo to assess LVF and LA size -continue Eliquis 5mg  BID  -BP stable at 127/59mmHg  - stop amlodipine to allow more BP to titrate BB for rate control -HR in the 90's currently but gets up into the 160's with working with PT -increase lopressor to 25mg  q6 hours and if his BP tolerates this will change to 50mg  BID tomorrow -check TSH -replete potassium to keep > 4 and Mag > 2.   2.  HTN -controlled -continue Lopressor but increase to 25mg  q6 hours for HR control -stopping amlodipine to allow more room for up titration of BB for HR control  3.  Chronic diastolic CHF -he does not appear volume overloaded on exam today -continue BB -he has not required diuretics outpt  4.  PAD -as above in HPI  5.  Type 2 DM -per PCP -was on Metformin as outpt -currently on SS Insulin  6.  Hypokalemia -replete to keep > 4  7.  Ventricular tachycardia -30 beat run VT noted yesterday -will increase BB -K+ was low at the time -need to keep K+>4 and mag > 2 -will stop SS potassium scale as his K+ has been low for days and not getting supplemented -start Kdur 65m daily starting tomorrow -check BMET in am -check 2D echo  , MD  07/03/2019  12:02 PM

## 2019-07-03 NOTE — Progress Notes (Signed)
OT Cancellation Note  Patient Details Name: Warren Mcdonald MRN: [id number] DOB: 1930-04-30   Cancelled Treatment:    Reason Eval/Treat Not Completed: Medical issues which prohibited therapy, sustained HR 130-160 supine in bed resting.  Will follow and see as able/medically appropriate.   Barry Brunner, OT Acute Rehabilitation Services Pager [phone] Office [phone]    Chancy Milroy 07/03/2019, 10:56 AM

## 2019-07-03 NOTE — Progress Notes (Addendum)
[facility]  Subjective  - No new complaints left LE amputation minimal pain.   Objective 127/77 (!) 109 98.3 F (36.8 C) (Oral) 14 100%  Intake/Output Summary (Last 24 hours) at 07/03/2019 0914 Last data filed at 07/03/2019 0617 Gross per 24 hour  Intake 1619 ml  Output 325 ml  Net 1294 ml    Left AKA dressing clean and dry Gen NAD Heart A fib 120-170's rate not controlled with BP hypotensive post op systolic low 100's-127  Gen NAD  Assessment/Planning: POD #1 Left AKA  Will replenish Mg and K+ PO, restarted Eliquis and called in Cardiology for assistants with A fib and rate control.    We will change the left AKA dressing tomorrow.   Mosetta Pigeon 07/03/2019 9:14 AM --  Agree with above.  Afib with RVR.  Rate 150-160 no symptoms currently.  Resume Eliquis Replete potassium and magnesium  Troponin negative Dr Rennis Golden to see for assistance in rate control  Laboratory Lab Results: Recent Labs    07/02/19 1807 07/03/19 0256  WBC 8.9 11.2*  HGB 10.0* 10.0*  HCT 31.6* 30.5*  PLT 374 383   BMET Recent Labs    07/02/19 1807 07/03/19 0256  NA 136 135  K 3.1* 3.3*  CL 102 98  CO2 22 22  GLUCOSE 210* 264*  BUN 16 19  CREATININE 1.28* 1.37*  CALCIUM 8.3* 8.3*    COAG Lab Results  Component Value Date   INR 1.1 06/16/2019   INR 1.06 12/28/2009   No results found for: PTT

## 2019-07-03 NOTE — Progress Notes (Signed)
PT Cancellation Note  Patient Details Name: Warren Mcdonald MRN: [id number] DOB: Oct 18, 1929   Cancelled Treatment:    Reason Eval/Treat Not Completed: Medical issues which prohibited therapy. Pt with tachycardia at rest. HR ranging 130-160. PT to re-attempt tomorrow.   Ilda Foil 07/03/2019, 11:23 AM   Aida Raider, PT  Office # [phone] Pager [phone]

## 2019-07-04 DIAGNOSIS — I420 Dilated cardiomyopathy: Secondary | ICD-10-CM

## 2019-07-04 DIAGNOSIS — I482 Chronic atrial fibrillation, unspecified: Secondary | ICD-10-CM

## 2019-07-04 DIAGNOSIS — E876 Hypokalemia: Secondary | ICD-10-CM

## 2019-07-04 LAB — CBC
HCT: 30.5 % — ABNORMAL LOW (ref 39.0–52.0)
Hemoglobin: 9.9 g/dL — ABNORMAL LOW (ref 13.0–17.0)
MCH: 29.3 pg (ref 26.0–34.0)
MCHC: 32.5 g/dL (ref 30.0–36.0)
MCV: 90.2 fL (ref 80.0–100.0)
Platelets: 410 10*3/uL — ABNORMAL HIGH (ref 150–400)
RBC: 3.38 MIL/uL — ABNORMAL LOW (ref 4.22–5.81)
RDW: 13.6 % (ref 11.5–15.5)
WBC: 9.1 10*3/uL (ref 4.0–10.5)
nRBC: 0 % (ref 0.0–0.2)

## 2019-07-04 LAB — BASIC METABOLIC PANEL
Anion gap: 11 (ref 5–15)
BUN: 19 mg/dL (ref 8–23)
CO2: 25 mmol/L (ref 22–32)
Calcium: 8.4 mg/dL — ABNORMAL LOW (ref 8.9–10.3)
Chloride: 102 mmol/L (ref 98–111)
Creatinine, Ser: 1.15 mg/dL (ref 0.61–1.24)
GFR calc Af Amer: >60 mL/min (ref >60)
GFR calc non Af Amer: 57 mL/min — ABNORMAL LOW (ref >60)
Glucose, Bld: 212 mg/dL — ABNORMAL HIGH (ref 70–99)
Potassium: 3.2 mmol/L — ABNORMAL LOW (ref 3.5–5.1)
Sodium: 138 mmol/L (ref 135–145)

## 2019-07-04 LAB — GLUCOSE, CAPILLARY
Glucose-Capillary: 175 mg/dL — ABNORMAL HIGH (ref 70–99)
Glucose-Capillary: 179 mg/dL — ABNORMAL HIGH (ref 70–99)
Glucose-Capillary: 167 mg/dL — ABNORMAL HIGH (ref 70–99)
Glucose-Capillary: 154 mg/dL — ABNORMAL HIGH (ref 70–99)

## 2019-07-04 LAB — MAGNESIUM: Magnesium: 2 mg/dL (ref 1.7–2.4)

## 2019-07-04 MED ORDER — POTASSIUM CHLORIDE ER 10 MEQ PO TBCR
60.0000 meq | EXTENDED_RELEASE_TABLET | Freq: Once | ORAL | Status: AC
  Administered 2019-07-04: 60 meq via ORAL
  Filled 2019-07-04 (×2): qty 6

## 2019-07-04 MED ORDER — METOPROLOL TARTRATE 50 MG PO TABS
50.0000 mg | ORAL_TABLET | Freq: Two times a day (BID) | ORAL | Status: DC
  Administered 2019-07-04 – 2019-07-06 (×5): 50 mg via ORAL
  Filled 2019-07-04 (×7): qty 1

## 2019-07-04 MED ORDER — DILTIAZEM HCL-DEXTROSE 125-5 MG/125ML-% IV SOLN (PREMIX)
5.0000 mg/h | INTRAVENOUS | Status: DC
  Administered 2019-07-04: 5 mg/h via INTRAVENOUS
  Filled 2019-07-04 (×3): qty 125

## 2019-07-04 MED ORDER — DILTIAZEM LOAD VIA INFUSION
10.0000 mg | Freq: Once | INTRAVENOUS | Status: AC
  Administered 2019-07-04: 10 mg via INTRAVENOUS
  Filled 2019-07-04: qty 10

## 2019-07-04 NOTE — Progress Notes (Signed)
Progress Note  Patient Name: Warren Mcdonald Date of Encounter: 07/04/2019  Primary Cardiologist: No primary care provider on file.   Subjective   Denies any chest pain or SOB.  HR fast today in afib as high as the 160's  Inpatient Medications    Scheduled Meds:  apixaban  5 mg Oral BID   aspirin EC  81 mg Oral Daily   docusate sodium  100 mg Oral Daily   insulin aspart  0-15 Units Subcutaneous TID WC   loratadine  10 mg Oral Daily   metoprolol tartrate  25 mg Oral Q6H   pantoprazole  40 mg Oral Daily   potassium chloride  40 mEq Oral Daily   Continuous Infusions:  PRN Meds: acetaminophen **OR** acetaminophen, alum & mag hydroxide-simeth, bisacodyl, guaiFENesin-dextromethorphan, hydrALAZINE, labetalol, morphine injection, ondansetron, oxyCODONE-acetaminophen, phenol, polyethylene glycol   Vital Signs    Vitals:   07/04/19 0400 07/04/19 0412 07/04/19 0500 07/04/19 0740  BP: 108/73 108/73  120/74  Pulse: (!) 102 (!) 108  97  Resp: 15     Temp: 97.8 F (36.6 C)   97.7 F (36.5 C)  TempSrc: Oral   Oral  SpO2: 96%   96%  Weight:   97.7 kg   Height:        Intake/Output Summary (Last 24 hours) at 07/04/2019 0749 Last data filed at 07/04/2019 0546 Gross per 24 hour  Intake 300 ml  Output 1400 ml  Net -1100 ml   Filed Weights   07/02/19 0500 07/03/19 0408 07/04/19 0500  Weight: 97.3 kg 97.5 kg 97.7 kg    Telemetry    Atrial fibrillation with HR in 115-160's- Personally Reviewed  ECG    No EKG to review - Personally Reviewed  Physical Exam   GEN: No acute distress.   Neck: No JVD Cardiac: irregularly irregular and tachy with no murmurs, rubs, or gallops.  Respiratory: Clear to auscultation bilaterally. GI: Soft, nontender, non-distended  MS: No edema; left AKA Neuro:  Nonfocal  Psych: Normal affect   Labs    Chemistry Recent Labs  Lab 07/02/19 1807 07/03/19 0256 07/04/19 0334  NA 136 135 138  K 3.1* 3.3* 3.2*  CL 102 98 102  CO2 22  22 25   GLUCOSE 210* 264* 212*  BUN 16 19 19   CREATININE 1.28* 1.37* 1.15  CALCIUM 8.3* 8.3* 8.4*  GFRNONAA 50* 46* 57*  GFRAA 58* 53* >60  ANIONGAP 12 15 11      Hematology Recent Labs  Lab 07/02/19 1807 07/03/19 0256 07/04/19 0334  WBC 8.9 11.2* 9.1  RBC 3.45* 3.38* 3.38*  HGB 10.0* 10.0* 9.9*  HCT 31.6* 30.5* 30.5*  MCV 91.6 90.2 90.2  MCH 29.0 29.6 29.3  MCHC 31.6 32.8 32.5  RDW 13.7 13.4 13.6  PLT 374 383 410*    Cardiac EnzymesNo results for input(s): TROPONINI in the last 168 hours. No results for input(s): TROPIPOC in the last 168 hours.   BNPNo results for input(s): BNP, PROBNP in the last 168 hours.   DDimer No results for input(s): DDIMER in the last 168 hours.   Radiology    ECHOCARDIOGRAM COMPLETE  Result Date: 07/03/2019   ECHOCARDIOGRAM REPORT   Patient Name:   Warren Mcdonald Date of Exam: 07/03/2019 Medical Rec #:  08/31/2019        Height:       73.0 in Accession #:    Shana Chute       Weight:  214.9 lb Date of Birth:  [birth year]       BSA:          2.22 m Patient Age:    86 years         BP:           121/68 mmHg Patient Gender: M                HR:           97 bpm. Exam Location:  Inpatient Procedure: 2D Echo, Cardiac Doppler and Color Doppler Indications:    I48.0 Paroxysmal atrial fibrillation  History:        Patient has prior history of Echocardiogram examinations, most                 recent 09/05/2014. CHF, PAD; Risk Factors:Hypertension, Diabetes                 and Dyslipidemia.  Sonographer:    Elmarie Shiley Dance Referring Phys: 33 Corliss Lamartina R Lindell Renfrew  Sonographer Comments: No subcostal window. IMPRESSIONS  1. Left ventricular ejection fraction, by visual estimation, is 40 to 45%. The left ventricle has moderately decreased function. There is mildly increased left ventricular hypertrophy.  2. Left ventricular diastolic function could not be evaluated.  3. Mildly dilated left ventricular internal cavity size.  4. The left ventricle demonstrates global  hypokinesis.  5. Global right ventricle has mildly reduced systolic function.The right ventricular size is normal. No increase in right ventricular wall thickness.  6. Left atrial size was moderately dilated.  7. Right atrial size was normal.  8. The mitral valve is abnormal. Mild to moderate mitral valve regurgitation.  9. The tricuspid valve is grossly normal. 10. The tricuspid valve is grossly normal. Tricuspid valve regurgitation is moderate. 11. The aortic valve is tricuspid. Aortic valve regurgitation is not visualized. Mild aortic valve sclerosis without stenosis. 12. The pulmonic valve was grossly normal. Pulmonic valve regurgitation is not visualized. FINDINGS  Left Ventricle: Left ventricular ejection fraction, by visual estimation, is 40 to 45%. The left ventricle has moderately decreased function. The left ventricle demonstrates global hypokinesis. The left ventricular internal cavity size was mildly dilated left ventricle. There is mildly increased left ventricular hypertrophy. The left ventricular diastology could not be evaluated due to atrial fibrillation. Left ventricular diastolic function could not be evaluated. Right Ventricle: The right ventricular size is normal. No increase in right ventricular wall thickness. Global RV systolic function is has mildly reduced systolic function. Left Atrium: Left atrial size was moderately dilated. Right Atrium: Right atrial size was normal in size Pericardium: There is no evidence of pericardial effusion. Mitral Valve: The mitral valve is abnormal. There is mild thickening of the mitral valve leaflet(s). There is mild calcification of the mitral valve leaflet(s). Mild to moderate mitral valve regurgitation. Tricuspid Valve: The tricuspid valve is grossly normal. Tricuspid valve regurgitation is moderate. Aortic Valve: The aortic valve is tricuspid. Aortic valve regurgitation is not visualized. Mild aortic valve sclerosis is present, with no evidence of aortic  valve stenosis. Pulmonic Valve: The pulmonic valve was grossly normal. Pulmonic valve regurgitation is not visualized. Pulmonic regurgitation is not visualized. Aorta: The aortic root and ascending aorta are structurally normal, with no evidence of dilitation. Venous: The inferior vena cava was not well visualized. IAS/Shunts: No atrial level shunt detected by color flow Doppler.  LEFT VENTRICLE PLAX 2D LVIDd:         5.95 cm LVIDs:  4.32 cm LV PW:         1.16 cm LV IVS:        0.83 cm LVOT diam:     2.20 cm LV SV:         93 ml LV SV Index:   41.01 LVOT Area:     3.80 cm  RIGHT VENTRICLE RV Basal diam:  2.28 cm RV S prime:     8.27 cm/s TAPSE (M-mode): 1.5 cm LEFT ATRIUM             Index       RIGHT ATRIUM           Index LA diam:        4.70 cm 2.12 cm/m  RA Area:     15.80 cm LA Vol (A2C):   76.2 ml 34.35 ml/m RA Volume:   30.80 ml  13.88 ml/m LA Vol (A4C):   92.1 ml 41.51 ml/m LA Biplane Vol: 88.7 ml 39.98 ml/m  AORTIC VALVE LVOT Vmax:   72.80 cm/s LVOT Vmean:  45.100 cm/s LVOT VTI:    0.121 m  AORTA Ao Root diam: 3.80 cm Ao Asc diam:  3.60 cm MITRAL VALVE                        TRICUSPID VALVE MV Area (PHT): 2.87 cm             TR Peak grad:   34.6 mmHg MV PHT:        76.56 msec           TR Vmax:        303.00 cm/s MV Decel Time: 264 msec MV E velocity: 111.50 cm/s 103 cm/s SHUNTS                                     Systemic VTI:  0.12 m                                     Systemic Diam: 2.20 cm  Lyman Bishop MD Electronically signed by Lyman Bishop MD Signature Date/Time: 07/03/2019/4:27:30 PM    Final     Cardiac Studies   2D echo 07/03/2019 IMPRESSIONS    1. Left ventricular ejection fraction, by visual estimation, is 40 to  45%. The left ventricle has moderately decreased function. There is mildly  increased left ventricular hypertrophy.  2. Left ventricular diastolic function could not be evaluated.  3. Mildly dilated left ventricular internal cavity size.  4. The left  ventricle demonstrates global hypokinesis.  5. Global right ventricle has mildly reduced systolic function.The right  ventricular size is normal. No increase in right ventricular wall  thickness.  6. Left atrial size was moderately dilated.  7. Right atrial size was normal.  8. The mitral valve is abnormal. Mild to moderate mitral valve  regurgitation.  9. The tricuspid valve is grossly normal.  10. The tricuspid valve is grossly normal. Tricuspid valve regurgitation  is moderate.  11. The aortic valve is tricuspid. Aortic valve regurgitation is not  visualized. Mild aortic valve sclerosis without stenosis.  12. The pulmonic valve was grossly normal. Pulmonic valve regurgitation is  not visualized.   Patient Profile     86 y.o. male with a hx of PAF with CHADS2VASC score  of 5 on chronic anticoagulation, chronic diastolic CHF and HTN.  He has an extensive hx of PAD s/p left above-knee popliteal to posterior tibial artery bypass graft in 2011 and angioplasty of occluded right tibioperoneal trunk and posterior tibial artery in 2010 followed by Vascular, DM2, HLD. He has recently had several hospital admissions for vascular complications.  Developed afib with RVR post op.    Assessment & Plan    1.  Atrial fibrillation with RVR -he has a hx of PAF In the past on Eliquis for a CHADS2VASC score of 6 -he has been off and on anticoagulation recently due to multiple surgeries for his gangrenous toe -per review of EKGs he has been in afib since 04/2019 - last EKG in [facility] was 2018 so would assume at this time he has chronic afib. -given advanced age, would recommend at this time pursuing rate control since he is fairly asymptomatic -2D echo with mildly reduced LVF and moderate LAE -HR poorly controlled today and as high was the 160's at rest -check TSH -K+ 3.2 this am - replete potassium to keep > 4 and Mag > 2.  -change Lopressor to 50mg  BID and continue Eliquis 5mg  BID -start IV Cardizem  gtt for better rate control - if BP becomes soft may need to change to IV Amio  2.  HTN -controlled BP 120/91mmHg -continue Lopressor  -amlodipine stopped to allow more room to titrate BB for HR control  3.  Chronic diastolic CHF -he does not appear volume overloaded on exam today -continue BB -he has not required diuretics outpt  4.  PAD -as above in HPI  5.  Type 2 DM -per PCP -was on Metformin as outpt -currently on SS Insulin  6.  Hypokalemia -K+ 3.2 this am -replete to keep > 4 -will give an additional Kdur today and continue 77m daily -check BMET in am  7.  Ventricular tachycardia -30 beat run VT noted 2/5 -BB increased -K+ was low at the time -need to keep K+>4 and mag > 2 -2D echo with mildly reduced LVF ? Tachy mediated -will need to decide how aggressive to be in workup for ischemic given advanced age and co morbidities  8.  Mild LV dysfunction -? Tachy mediated from afib with RVR -with hx of PVD likely has CAD as well -continue BB -would benefit from addition of ARB once HR controlled on BB titration  I have spent a total of 35 minutes with patient reviewing 2D echo, hospital notes , telemetry, EKGs, labs and examining patient as well as establishing an assessment and plan that was discussed with the patient.  > 50% of time was spent in direct patient care.        For questions or updates, please contact [facility] Please consult [email or web address] for contact info under Cardiology/STEMI.      Signed, , MD  07/04/2019, 7:49 AM

## 2019-07-04 NOTE — Progress Notes (Signed)
Physical Therapy Re-Evaluation and Treatment Patient Details Name: Warren Mcdonald MRN: [id number] DOB: April 08, 1930 Today's Date: 07/04/2019    History of Present Illness 86 year old male s/p non healing wound requiring open transmetatarsal amputation of left third, fourth and fifth. 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 PMHx of popliteal to posterior tibial artery bypass, PVD, HTN, T2DM.    PT Comments    PT re-evaluation completed following L AKA. Pt required +2 min assist supine to sit. Pt sat EOB x 5 minutes supervision to min guard. Session limited by tachycardia with mobility. Max HR 169 and sustained in 140s and 150s sitting EOB. Pt returned to supine at end of session. Pt very motivated and cooperative. Goals updated. Discharge recommendation changed to CIR.    Follow Up Recommendations  CIR     Equipment Recommendations  3in1 (PT);Wheelchair (measurements PT);Wheelchair cushion (measurements PT)    Recommendations for Other Services Rehab consult     Precautions / Restrictions Precautions Precautions: Fall;Other (comment) Precaution Comments: watch HR Restrictions Weight Bearing Restrictions: Yes LLE Weight Bearing: Non weight bearing    Mobility  Bed Mobility Overal bed mobility: Needs Assistance Bed Mobility: Supine to Sit;Sit to Supine     Supine to sit: Min assist;+2 for physical assistance Sit to supine: Min assist;+2 for physical assistance   General bed mobility comments: pt requires support for trunk and scooting to EOB. Increased time to stabilize sitting balance.  Transfers                 General transfer comment: deferred due to HR   Ambulation/Gait                 Stairs             Wheelchair Mobility    Modified Rankin (Stroke Patients Only)       Balance Overall balance assessment: Needs assistance Sitting-balance support: Feet supported;No upper extremity supported Sitting balance-Leahy Scale: Fair Sitting balance - Comments: min  guard to supervision statically                                    Cognition Arousal/Alertness: Awake/alert Behavior During Therapy: WFL for tasks assessed/performed Overall Cognitive Status: Within Functional Limits for tasks assessed                                        Exercises      General Comments General comments (skin integrity, edema, etc.): tachy with mobility, HR 117-169      Pertinent Vitals/Pain Pain Assessment: Faces Faces Pain Scale: Hurts little more Pain Location: L AKA  Pain Descriptors / Indicators: Discomfort;Operative site guarding Pain Intervention(s): Limited activity within patient's tolerance;Repositioned;Monitored during session    Home Living                      Prior Function            PT Goals (current goals can now be found in the care plan section) Acute Rehab PT Goals Patient Stated Goal: home PT Goal Formulation: With patient Time For Goal Achievement: 07/11/19 Potential to Achieve Goals: Good Progress towards PT goals: Goals downgraded-see care plan    Frequency    Min 3X/week      PT Plan Discharge plan needs to be updated;Equipment recommendations need to be  updated    Co-evaluation PT/OT/SLP Co-Evaluation/Treatment: Yes Reason for Co-Treatment: For patient/therapist safety;Complexity of the patient's impairments (multi-system involvement) PT goals addressed during session: Mobility/safety with mobility;Balance OT goals addressed during session: ADL's and self-care      AM-PAC PT "6 Clicks" Mobility   Outcome Measure  Help needed turning from your back to your side while in a flat bed without using bedrails?: A Little Help needed moving from lying on your back to sitting on the side of a flat bed without using bedrails?: A Little Help needed moving to and from a bed to a chair (including a wheelchair)?: A Lot Help needed standing up from a chair using your arms (e.g.,  wheelchair or bedside chair)?: Total Help needed to walk in hospital room?: Total Help needed climbing 3-5 steps with a railing? : Total 6 Click Score: 11    End of Session   Activity Tolerance: Treatment limited secondary to medical complications (Comment)(tachy) Patient left: in bed;with call bell/phone within reach Nurse Communication: Mobility status PT Visit Diagnosis: Other abnormalities of gait and mobility (R26.89);Muscle weakness (generalized) (M62.81);Difficulty in walking, not elsewhere classified (R26.2);Pain Pain - Right/Left: Left Pain - part of body: Leg     Time: 5176-1607 PT Time Calculation (min) (ACUTE ONLY): 16 min  Charges:                        Lorrin Goodell, PT  Office # [phone] Pager [phone]    Lorriane Shire 07/04/2019, 11:39 AM

## 2019-07-04 NOTE — Anesthesia Postprocedure Evaluation (Signed)
Anesthesia Post Note  Patient: Warren Mcdonald  Procedure(s) Performed: left AMPUTATION ABOVE KNEE (Left Knee)     Patient location during evaluation: PACU Anesthesia Type: General Level of consciousness: awake and alert Pain management: pain level controlled Vital Signs Assessment: post-procedure vital signs reviewed and stable Respiratory status: spontaneous breathing, nonlabored ventilation, respiratory function stable and patient connected to nasal cannula oxygen Cardiovascular status: blood pressure returned to baseline and stable Postop Assessment: no apparent nausea or vomiting Anesthetic complications: no    Last Vitals:  Vitals:   07/04/19 0740 07/04/19 0818  BP: 120/74 120/74  Pulse: 97   Resp:    Temp: 36.5 C   SpO2: 96%     Last Pain:  Vitals:   07/04/19 0740  TempSrc: Oral  PainSc: 0-No pain                 Trevor Iha

## 2019-07-04 NOTE — Progress Notes (Signed)
[facility]  Subjective  - feels ok   Objective 120/74 97 97.7 F (36.5 C) (Oral) 15 96%  Intake/Output Summary (Last 24 hours) at 07/04/2019 0933 Last data filed at 07/04/2019 0546 Gross per 24 hour  Intake 300 ml  Output 1400 ml  Net -1100 ml   Left AKA dressing dry  Assessment/Planning: afib per cardiology NOAC and rate control.  Appreciate their assistance  Should be ready for d/c when cardiology issues stable  Replete potassium for hypokalemia  Fabienne Bruns 07/04/2019 9:33 AM --  Laboratory Lab Results: Recent Labs    07/03/19 0256 07/04/19 0334  WBC 11.2* 9.1  HGB 10.0* 9.9*  HCT 30.5* 30.5*  PLT 383 410*   BMET Recent Labs    07/03/19 0256 07/04/19 0334  NA 135 138  K 3.3* 3.2*  CL 98 102  CO2 22 25  GLUCOSE 264* 212*  BUN 19 19  CREATININE 1.37* 1.15  CALCIUM 8.3* 8.4*    COAG Lab Results  Component Value Date   INR 1.1 06/16/2019   INR 1.06 12/28/2009   No results found for: PTT

## 2019-07-04 NOTE — Progress Notes (Signed)
Occupational Therapy RE-evaluation  Patient Details Name: Warren Mcdonald MRN: [id number] DOB: Apr 19, 1930 Today's Date: 07/04/2019    History of present illness 86 year old male s/p non healing wound requiring open transmetatarsal amputation of left third, fourth and fifth. 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 PMHx of popliteal to posterior tibial artery bypass, PVD, HTN, T2DM.   OT comments  Patient seen post L AKA.  Patient remains limited by elevated HR, ranging from 117-169 during session (RN reports this am HR remains in low 100s, but elevated with rolling in bed for bath).  Patient currently requires min assist +2 for bed mobility and min guard to close supervision for sitting EOB.  Requires setup to total assist for ADLs.  Updated dc plan to CIR s/p L AKA, to optimize independence and return to PLOF with ADLs, even at w/c level.  Goals updated.    Follow Up Recommendations  CIR;Supervision/Assistance - 24 hour    Equipment Recommendations  3 in 1 bedside commode    Recommendations for Other Services Rehab consult    Precautions / Restrictions Precautions Precautions: Fall Precaution Comments: watch HR Restrictions Weight Bearing Restrictions: Yes LLE Weight Bearing: Non weight bearing       Mobility Bed Mobility Overal bed mobility: Needs Assistance Bed Mobility: Supine to Sit;Sit to Supine     Supine to sit: Min assist;+2 for physical assistance Sit to supine: Min assist;+2 for physical assistance   General bed mobility comments: pt requires support for trunk and scooting to EOB   Transfers                 General transfer comment: deferred due to HR     Balance Overall balance assessment: Needs assistance Sitting-balance support: Feet supported;No upper extremity supported Sitting balance-Leahy Scale: Fair Sitting balance - Comments: min guard to supervision statically                                   ADL either performed or assessed with clinical  judgement   ADL Overall ADL's : Needs assistance/impaired Eating/Feeding: Set up;Bed level   Grooming: Min guard;Sitting   Upper Body Bathing: Min guard;Sitting   Lower Body Bathing: Total assistance;Bed level   Upper Body Dressing : Minimal assistance;Sitting   Lower Body Dressing: Total assistance;Bed level     Toilet Transfer Details (indicate cue type and reason): deferred due to HR          Functional mobility during ADLs: Minimal assistance;+2 for physical assistance;+2 for safety/equipment General ADL Comments: pt limited by HR      Vision       Perception     Praxis      Cognition Arousal/Alertness: Awake/alert Behavior During Therapy: WFL for tasks assessed/performed Overall Cognitive Status: Within Functional Limits for tasks assessed                                          Exercises     Shoulder Instructions       General Comments elevated HR, 117-169     Pertinent Vitals/ Pain       Pain Assessment: Faces Faces Pain Scale: Hurts little more Pain Location: L AKA  Pain Descriptors / Indicators: Discomfort;Operative site guarding Pain Intervention(s): Limited activity within patient's tolerance;Monitored during session;Repositioned  Home Living  Prior Functioning/Environment              Frequency  Min 2X/week        Progress Toward Goals  OT Goals(current goals can now be found in the care plan section)  Progress towards OT goals: Not progressing toward goals - comment(s/p L AKA, goals updated)  Acute Rehab OT Goals Patient Stated Goal: to get better OT Goal Formulation: With patient Time For Goal Achievement: 07/18/19 Potential to Achieve Goals: Good  Plan Discharge plan needs to be updated;Frequency remains appropriate    Co-evaluation    PT/OT/SLP Co-Evaluation/Treatment: Yes Reason for Co-Treatment: For patient/therapist safety;To address  functional/ADL transfers   OT goals addressed during session: ADL's and self-care      AM-PAC OT "6 Clicks" Daily Activity     Outcome Measure   Help from another person eating meals?: None Help from another person taking care of personal grooming?: A Little Help from another person toileting, which includes using toliet, bedpan, or urinal?: Total Help from another person bathing (including washing, rinsing, drying)?: A Lot Help from another person to put on and taking off regular upper body clothing?: A Little Help from another person to put on and taking off regular lower body clothing?: Total 6 Click Score: 14    End of Session    OT Visit Diagnosis: Unsteadiness on feet (R26.81);Muscle weakness (generalized) (M62.81)   Activity Tolerance Treatment limited secondary to medical complications (Comment)(elevated HR )   Patient Left in bed;with call bell/phone within reach;with bed alarm set   Nurse Communication Mobility status        Time: 0071-2197 OT Time Calculation (min): 17 min  Charges: OT General Charges $OT Visit: 1 Visit OT Evaluation $OT Re-eval: 1 Re-eval  Warren Mcdonald, OT Acute Rehabilitation Services Pager [phone] Office [phone]    Warren Mcdonald 07/04/2019, 11:11 AM

## 2019-07-05 ENCOUNTER — Inpatient Hospital Stay: Payer: Medicare Other | Admitting: Surgery

## 2019-07-05 ENCOUNTER — Encounter: Payer: Medicare Other | Admitting: Surgery

## 2019-07-05 DIAGNOSIS — I480 Paroxysmal atrial fibrillation: Secondary | ICD-10-CM

## 2019-07-05 LAB — BASIC METABOLIC PANEL
Anion gap: 12 (ref 5–15)
BUN: 20 mg/dL (ref 8–23)
CO2: 21 mmol/L — ABNORMAL LOW (ref 22–32)
Calcium: 8.7 mg/dL — ABNORMAL LOW (ref 8.9–10.3)
Chloride: 104 mmol/L (ref 98–111)
Creatinine, Ser: 1.18 mg/dL (ref 0.61–1.24)
GFR calc Af Amer: >60 mL/min (ref >60)
GFR calc non Af Amer: 55 mL/min — ABNORMAL LOW (ref >60)
Glucose, Bld: 150 mg/dL — ABNORMAL HIGH (ref 70–99)
Potassium: 3.6 mmol/L (ref 3.5–5.1)
Sodium: 137 mmol/L (ref 135–145)

## 2019-07-05 LAB — LIPID PANEL
Cholesterol: 142 mg/dL (ref 0–200)
HDL: 30 mg/dL — ABNORMAL LOW (ref >40)
LDL Cholesterol: 92 mg/dL (ref 0–99)
Total CHOL/HDL Ratio: 4.7 RATIO
Triglycerides: 100 mg/dL (ref <150)
VLDL: 20 mg/dL (ref 0–40)

## 2019-07-05 LAB — TSH: TSH: 2.795 u[IU]/mL (ref 0.350–4.500)

## 2019-07-05 LAB — GLUCOSE, CAPILLARY
Glucose-Capillary: 166 mg/dL — ABNORMAL HIGH (ref 70–99)
Glucose-Capillary: 142 mg/dL — ABNORMAL HIGH (ref 70–99)
Glucose-Capillary: 222 mg/dL — ABNORMAL HIGH (ref 70–99)

## 2019-07-05 LAB — SURGICAL PATHOLOGY

## 2019-07-05 LAB — MAGNESIUM: Magnesium: 2 mg/dL (ref 1.7–2.4)

## 2019-07-05 MED ORDER — DILTIAZEM HCL ER COATED BEADS 120 MG PO CP24
120.0000 mg | ORAL_CAPSULE | Freq: Every day | ORAL | Status: DC
  Administered 2019-07-05 – 2019-07-06 (×2): 120 mg via ORAL
  Filled 2019-07-05 (×2): qty 1

## 2019-07-05 NOTE — Progress Notes (Signed)
Physical Therapy Treatment Patient Details Name: Warren Mcdonald MRN: [id number] DOB: 1929/12/02 Today's Date: 07/05/2019    History of Present Illness 86 year old male s/p non healing wound requiring open transmetatarsal amputation of left third, fourth and fifth. 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 PMHx of popliteal to posterior tibial artery bypass, PVD, HTN, T2DM.    PT Comments    Patient seen for mobility progression. This session focused on bed mobility and functional transfer training. Pt c/o L LE pain/sensitivity but tolerated OOB transfers well. Pt continues to be a good candidate for CIR level therapies however noted that pt has declined going to CIR. Pt will need HHPT, 24 hour assistance/supervision, and below DME if discharging home.  PT will continue to follow acutely and progress as tolerated.    Follow Up Recommendations  CIR     Equipment Recommendations  Wheelchair (measurements PT);Wheelchair cushion (measurements PT);Other (comment)(drop arm 3 in 1)    Recommendations for Other Services       Precautions / Restrictions Precautions Precautions: Fall Precaution Comments: watch HR Restrictions Weight Bearing Restrictions: Yes LLE Weight Bearing: Non weight bearing    Mobility  Bed Mobility Overal bed mobility: Needs Assistance Bed Mobility: Rolling;Sidelying to Sit Rolling: Min assist Sidelying to sit: Mod assist       General bed mobility comments: cues for sequencing/technique; use of rail; assist to elevate trunk into sitting   Transfers Overall transfer level: Needs assistance Equipment used: Rolling walker (2 wheeled) Transfers: Sit to/from Stand;Lateral/Scoot Transfers Sit to Stand: +2 physical assistance;Max assist        Lateral/Scoot Transfers: Min assist General transfer comment: pt able to scoot laterally toward R side to drop arm recliner with cues for hand placement and maintaining trunk anteriorly throughout transfer; pt able to stand X 2 trials from  recliner with cues for hand placement/positioning each trial  Ambulation/Gait                 Stairs             Wheelchair Mobility    Modified Rankin (Stroke Patients Only)       Balance Overall balance assessment: Needs assistance Sitting-balance support: Feet supported;Bilateral upper extremity supported Sitting balance-Leahy Scale: Poor     Standing balance support: Bilateral upper extremity supported Standing balance-Leahy Scale: Poor                              Cognition Arousal/Alertness: Awake/alert Behavior During Therapy: WFL for tasks assessed/performed Overall Cognitive Status: Within Functional Limits for tasks assessed                                 General Comments: HOH and needs repetition of cues; [facility] for basic mobility tasks. slightly difficult to understand at times. very appreciative of session      Exercises      General Comments        Pertinent Vitals/Pain Pain Assessment: Faces Faces Pain Scale: Hurts even more Pain Location: L AKA  Pain Descriptors / Indicators: Grimacing;Guarding;Sore Pain Intervention(s): Limited activity within patient's tolerance;Monitored during session;Repositioned    Home Living                      Prior Function            PT Goals (current goals can now be found in the care  plan section) Progress towards PT goals: Progressing toward goals    Frequency    Min 3X/week      PT Plan Current plan remains appropriate    Co-evaluation PT/OT/SLP Co-Evaluation/Treatment: Yes Reason for Co-Treatment: For patient/therapist safety;To address functional/ADL transfers PT goals addressed during session: Mobility/safety with mobility        AM-PAC PT "6 Clicks" Mobility   Outcome Measure  Help needed turning from your back to your side while in a flat bed without using bedrails?: A Little Help needed moving from lying on your back to sitting on the side  of a flat bed without using bedrails?: A Little Help needed moving to and from a bed to a chair (including a wheelchair)?: A Lot Help needed standing up from a chair using your arms (e.g., wheelchair or bedside chair)?: A Lot Help needed to walk in hospital room?: Total Help needed climbing 3-5 steps with a railing? : Total 6 Click Score: 12    End of Session Equipment Utilized During Treatment: Gait belt Activity Tolerance: Patient tolerated treatment well Patient left: with call bell/phone within reach;in chair;with family/visitor present Nurse Communication: Mobility status PT Visit Diagnosis: Other abnormalities of gait and mobility (R26.89);Muscle weakness (generalized) (M62.81);Difficulty in walking, not elsewhere classified (R26.2);Pain Pain - Right/Left: Left Pain - part of body: Leg     Time: 3009-2330 PT Time Calculation (min) (ACUTE ONLY): 27 min  Charges:  $Gait Training: 8-22 mins                     Earney Navy, PTA Acute Rehabilitation Services Pager: [phone] Office: [phone]     Darliss Cheney 07/05/2019, 4:42 PM

## 2019-07-05 NOTE — Progress Notes (Addendum)
The patient has been seen in conjunction with Theodore Demark, PA-C. All aspects of care have been considered and discussed. The patient has been personally interviewed, examined, and all clinical data has been reviewed.   Currently with moderate to poor rate control.  Amiodarone was discontinued on admission.  Blood pressures are somewhat low making it difficult to significantly uptitrate slowing therapy.  We may need to add digoxin.  Plan apixaban and rate control for atrial fibrillation.   Progress Note  Patient Name: Warren Mcdonald Date of Encounter: 07/05/2019  Primary Cardiologist: Dina Rich, MD   Subjective   Denies awareness of the atrial fib, no chest pain or SOB (does 1250 cc on incentive spirometry)  Inpatient Medications    Scheduled Meds: . apixaban  5 mg Oral BID  . aspirin EC  81 mg Oral Daily  . docusate sodium  100 mg Oral Daily  . insulin aspart  0-15 Units Subcutaneous TID WC  . loratadine  10 mg Oral Daily  . metoprolol tartrate  50 mg Oral BID  . pantoprazole  40 mg Oral Daily  . potassium chloride  40 mEq Oral Daily   Continuous Infusions: . diltiazem (CARDIZEM) infusion 5 mg/hr (07/04/19 1117)   PRN Meds: acetaminophen **OR** acetaminophen, alum & mag hydroxide-simeth, bisacodyl, guaiFENesin-dextromethorphan, hydrALAZINE, labetalol, morphine injection, ondansetron, oxyCODONE-acetaminophen, phenol, polyethylene glycol   Vital Signs    Vitals:   07/04/19 2000 07/05/19 0000 07/05/19 0410 07/05/19 0800  BP: 102/63 (!) 105/58 119/69 129/81  Pulse:   89 (!) 108  Resp:  15 18   Temp: 97.7 F (36.5 C) 98.5 F (36.9 C) 98 F (36.7 C) 98.4 F (36.9 C)  TempSrc: Oral Oral Oral Oral  SpO2: 100% 100% 100% 99%  Weight:   94.6 kg   Height:        Intake/Output Summary (Last 24 hours) at 07/05/2019 1036 Last data filed at 07/05/2019 0400 Gross per 24 hour  Intake 483.53 ml  Output 550 ml  Net -66.47 ml   Filed Weights   07/03/19 0408  07/04/19 0500 07/05/19 0410  Weight: 97.5 kg 97.7 kg 94.6 kg    Telemetry    Atrial fib, HR gen between 90-110- Personally Reviewed  ECG    No EKG to review - Personally Reviewed  Physical Exam   General: Well developed, well nourished, male in no acute distress Head: Eyes PERRLA, Head normocephalic and atraumatic Lungs: decreased BS bases bilaterally to auscultation. Heart: Irreg R&R S1 S2, without rub or gallop. No murmur. Upper extremity pulses are 2+ & equal. mild JVD. Abdomen: Bowel sounds are decrease, abdomen distended and firm and non-tender without masses or  hernias noted. Msk: weak strength and tone for age. Extremities: No clubbing, cyanosis or edema.  S/p L AKA, dressing not disturbed  Skin:  No rashes or lesions noted. Neuro: Alert and oriented X 3. Psych:  Good affect, responds appropriately  Labs    Chemistry Recent Labs  Lab 07/02/19 1807 07/03/19 0256 07/04/19 0334  NA 136 135 138  K 3.1* 3.3* 3.2*  CL 102 98 102  CO2 22 22 25   GLUCOSE 210* 264* 212*  BUN 16 19 19   CREATININE 1.28* 1.37* 1.15  CALCIUM 8.3* 8.3* 8.4*  GFRNONAA 50* 46* 57*  GFRAA 58* 53* >60  ANIONGAP 12 15 11      Hematology Recent Labs  Lab 07/02/19 1807 07/03/19 0256 07/04/19 0334  WBC 8.9 11.2* 9.1  RBC 3.45* 3.38* 3.38*  HGB 10.0*  10.0* 9.9*  HCT 31.6* 30.5* 30.5*  MCV 91.6 90.2 90.2  MCH 29.0 29.6 29.3  MCHC 31.6 32.8 32.5  RDW 13.7 13.4 13.6  PLT 374 383 410*    Cardiac Enzymes High Sensitivity Troponin:   Recent Labs  Lab 07/02/19 1807  TROPONINIHS 8     BNPNo results for input(s): BNP, PROBNP in the last 168 hours.   Lab Results  Component Value Date   TSH 1.682 09/05/2014   Magnesium  Date Value Ref Range Status  07/05/2019 2.0 1.7 - 2.4 mg/dL Final    Comment:    Performed at [facility], [address], [place], [place] [address]  07/04/2019 2.0 1.7 - 2.4 mg/dL Final    Comment:    Performed at [facility], [address], [place], [place] [address]  07/02/2019 1.6 (L) 1.7 - 2.4 mg/dL Final    Comment:    Performed at [facility], [address], [place], [place] [address]   Lab Results  Component Value Date   CHOL 133 10/22/2016   HDL 44 10/22/2016   LDLCALC 69 10/22/2016   TRIG 100 10/22/2016    Radiology    ECHOCARDIOGRAM COMPLETE  Result Date: 07/03/2019   ECHOCARDIOGRAM REPORT   Patient Name:   Warren Mcdonald Date of Exam: 07/03/2019 Medical Rec #:  [id number]        Height:       73.0 in Accession #:    [id number]       Weight:       214.9 lb Date of Birth:  04/16/1930       BSA:          2.22 m Patient Age:    86 years         BP:           121/68 mmHg Patient Gender: M                HR:           97 bpm. Exam Location:  Inpatient Procedure: 2D Echo, Cardiac Doppler and Color Doppler Indications:    I48.0 Paroxysmal atrial fibrillation  History:        Patient has prior history of Echocardiogram examinations, most                 recent 09/05/2014. CHF, PAD; Risk Factors:Hypertension, Diabetes                 and Dyslipidemia.  Sonographer:    Elmarie Shiley Dance Referring Phys: 35 TRACI R TURNER  Sonographer Comments: No subcostal window. IMPRESSIONS  1. Left ventricular ejection fraction, by visual estimation, is 40 to 45%. The left ventricle has moderately decreased function. There is mildly increased left ventricular hypertrophy.  2. Left ventricular diastolic function could not be evaluated.  3. Mildly dilated left ventricular internal cavity size.  4. The left ventricle demonstrates global hypokinesis.  5. Global right ventricle has mildly reduced systolic function.The right ventricular size is normal. No increase in right ventricular wall thickness.  6. Left atrial size was moderately dilated.  7. Right atrial size was normal.  8. The mitral valve is abnormal. Mild to moderate mitral valve regurgitation.  9. The tricuspid valve is grossly normal. 10. The tricuspid valve is grossly normal. Tricuspid  valve regurgitation is moderate. 11. The aortic valve is tricuspid. Aortic valve regurgitation is not visualized. Mild aortic valve sclerosis without stenosis. 12. The pulmonic  valve was grossly normal. Pulmonic valve regurgitation is not visualized. FINDINGS  Left Ventricle: Left ventricular ejection fraction, by visual estimation, is 40 to 45%. The left ventricle has moderately decreased function. The left ventricle demonstrates global hypokinesis. The left ventricular internal cavity size was mildly dilated left ventricle. There is mildly increased left ventricular hypertrophy. The left ventricular diastology could not be evaluated due to atrial fibrillation. Left ventricular diastolic function could not be evaluated. Right Ventricle: The right ventricular size is normal. No increase in right ventricular wall thickness. Global RV systolic function is has mildly reduced systolic function. Left Atrium: Left atrial size was moderately dilated. Right Atrium: Right atrial size was normal in size Pericardium: There is no evidence of pericardial effusion. Mitral Valve: The mitral valve is abnormal. There is mild thickening of the mitral valve leaflet(s). There is mild calcification of the mitral valve leaflet(s). Mild to moderate mitral valve regurgitation. Tricuspid Valve: The tricuspid valve is grossly normal. Tricuspid valve regurgitation is moderate. Aortic Valve: The aortic valve is tricuspid. Aortic valve regurgitation is not visualized. Mild aortic valve sclerosis is present, with no evidence of aortic valve stenosis. Pulmonic Valve: The pulmonic valve was grossly normal. Pulmonic valve regurgitation is not visualized. Pulmonic regurgitation is not visualized. Aorta: The aortic root and ascending aorta are structurally normal, with no evidence of dilitation. Venous: The inferior vena cava was not well visualized. IAS/Shunts: No atrial level shunt detected by color flow Doppler.  LEFT VENTRICLE PLAX 2D LVIDd:          5.95 cm LVIDs:         4.32 cm LV PW:         1.16 cm LV IVS:        0.83 cm LVOT diam:     2.20 cm LV SV:         93 ml LV SV Index:   41.01 LVOT Area:     3.80 cm  RIGHT VENTRICLE RV Basal diam:  2.28 cm RV S prime:     8.27 cm/s TAPSE (M-mode): 1.5 cm LEFT ATRIUM             Index       RIGHT ATRIUM           Index LA diam:        4.70 cm 2.12 cm/m  RA Area:     15.80 cm LA Vol (A2C):   76.2 ml 34.35 ml/m RA Volume:   30.80 ml  13.88 ml/m LA Vol (A4C):   92.1 ml 41.51 ml/m LA Biplane Vol: 88.7 ml 39.98 ml/m  AORTIC VALVE LVOT Vmax:   72.80 cm/s LVOT Vmean:  45.100 cm/s LVOT VTI:    0.121 m  AORTA Ao Root diam: 3.80 cm Ao Asc diam:  3.60 cm MITRAL VALVE                        TRICUSPID VALVE MV Area (PHT): 2.87 cm             TR Peak grad:   34.6 mmHg MV PHT:        76.56 msec           TR Vmax:        303.00 cm/s MV Decel Time: 264 msec MV E velocity: 111.50 cm/s 103 cm/s SHUNTS  Systemic VTI:  0.12 m                                     Systemic Diam: 2.20 cm  Zoila Shutter MD Electronically signed by Zoila Shutter MD Signature Date/Time: 07/03/2019/4:27:30 PM    Final     Cardiac Studies   2D echo 07/03/2019 IMPRESSIONS    1. Left ventricular ejection fraction, by visual estimation, is 40 to  45%. The left ventricle has moderately decreased function. There is mildly  increased left ventricular hypertrophy.  2. Left ventricular diastolic function could not be evaluated.  3. Mildly dilated left ventricular internal cavity size.  4. The left ventricle demonstrates global hypokinesis.  5. Global right ventricle has mildly reduced systolic function.The right  ventricular size is normal. No increase in right ventricular wall  thickness.  6. Left atrial size was moderately dilated.  7. Right atrial size was normal.  8. The mitral valve is abnormal. Mild to moderate mitral valve  regurgitation.  9. The tricuspid valve is grossly normal.  10. The  tricuspid valve is grossly normal. Tricuspid valve regurgitation  is moderate.  11. The aortic valve is tricuspid. Aortic valve regurgitation is not  visualized. Mild aortic valve sclerosis without stenosis.  12. The pulmonic valve was grossly normal. Pulmonic valve regurgitation is  not visualized.   Patient Profile     86 y.o. male with a hx of PAF with CHADS2VASC score of 5 on chronic anticoagulation, chronic diastolic CHF and HTN.  He has an extensive hx of PAD s/p left above-knee popliteal to posterior tibial artery bypass graft in 2011 and angioplasty of occluded right tibioperoneal trunk and posterior tibial artery in 2010 followed by Vascular, DM2, HLD. He has recently had several hospital admissions for vascular complications.  Developed afib with RVR post op.    Assessment & Plan    1.  Atrial fibrillation with RVR - Hx PAF, off Eliquis for procedures  - Afib likely chronic now>>rate control - EF 40-45% on echo (55% 2016), mod LA dilat, RA size normal - ck TSH - having vent ectopy, keep K+ 4.0 and Mg 2.0 - recheck today - on Cardizem IV at 5 mg/hr, BP not consistently high enough to increase - change to Cardizem CD 120 mg qd  2.  HTN - on amlodipine pta, held for HR control rx - SBP range 102-129 last 24 hr  3.  Chronic diastolic CHF - no sx volume overload - not on diuretics as outpt - continue to follow clinically  4.  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  Hypokalemia - has not had diuretics - K+ has generally been a little low since 01/29 - on 02/07 got a total of 100 meq KCl - now on 40 meq qd - recheck BMET  7.  Ventricular tachycardia - has had 3 bt runs and PVCs/pairs - Lopressor increased to 50 mg bid on 02/07 - BP will not tolerate higher dose - Keep K+ > 4.0 and Mg > 2.0  - no symptoms, no hx syncope  8.  Mild LV dysfunction - possibly tachy-mediated, no hx inschemic sx - hx PAD, high likelihood of CAD - on BB, no ASA 2nd Eliquis -  no recent lipids, will order - once HR controlled, consider ARB if BP will tolerate   For questions or updates, please contact [facility] Please consult [email or web address] for contact info under Cardiology/STEMI.      Signed, Theodore Demark, PA-C  07/05/2019, 10:36 AM

## 2019-07-05 NOTE — Progress Notes (Addendum)
  Progress Note    07/05/2019 7:25 AM 3 Days Post-Op  Subjective:  Minimal pain L AKA\   Vitals:   07/05/19 0000 07/05/19 0410  BP: (!) 105/58 119/69  Pulse:  89  Resp: 15 18  Temp: 98.5 F (36.9 C) 98 F (36.7 C)  SpO2: 100% 100%   Physical Exam: Lungs:  Non labored Incisions:  L AKA incision c/d/i Abdomen:  Soft Neurologic: a&O  CBC    Component Value Date/Time   WBC 9.1 07/04/2019 0334   RBC 3.38 (L) 07/04/2019 0334   HGB 9.9 (L) 07/04/2019 0334   HCT 30.5 (L) 07/04/2019 0334   PLT 410 (H) 07/04/2019 0334   MCV 90.2 07/04/2019 0334   MCH 29.3 07/04/2019 0334   MCHC 32.5 07/04/2019 0334   RDW 13.6 07/04/2019 0334   LYMPHSABS 1.2 09/10/2014 0433   MONOABS 0.8 09/10/2014 0433   EOSABS 0.2 09/10/2014 0433   BASOSABS 0.1 09/10/2014 0433    BMET    Component Value Date/Time   NA 138 07/04/2019 0334   NA 138 11/03/2017 0000   K 3.2 (L) 07/04/2019 0334   CL 102 07/04/2019 0334   CO2 25 07/04/2019 0334   GLUCOSE 212 (H) 07/04/2019 0334   BUN 19 07/04/2019 0334   BUN 23 (A) 11/03/2017 0000   CREATININE 1.15 07/04/2019 0334   CALCIUM 8.4 (L) 07/04/2019 0334   GFRNONAA 57 (L) 07/04/2019 0334   GFRAA >60 07/04/2019 0334    INR    Component Value Date/Time   INR 1.1 06/16/2019 0857     Intake/Output Summary (Last 24 hours) at 07/05/2019 0725 Last data filed at 07/05/2019 0400 Gross per 24 hour  Intake 683.53 ml  Output 1200 ml  Net -516.47 ml     Assessment/Plan:  86 y.o. male is s/p L AKA 3 Days Post-Op   L AKA incision unremarkable PT/OT recommending CIR; consult ordered for CIR Appreciate Cardiology assistance with management of A fib with RVR Dispo: pending Cardiology clearance and placement   Emilie Rutter, PA-C [facility] [phone] 07/05/2019 7:25 AM   I agree with the above.  I have seen and examined the patient.  Dispo planning pending  Durene Cal

## 2019-07-05 NOTE — Progress Notes (Signed)
Inpatient Rehabilitation-Admissions Coordinator   Consult order received. Met with pt bedside for assessment. Discussed rehab program. Pt is adamant that he has great support at home and does not want to pursue the rehab program here at the hospital. Pt had no further questions about the program. AC will sign off. TOC team notified.    [place], OTR/L  Rehab Admissions Coordinator  [phone] 07/05/2019 10:35 AM  

## 2019-07-05 NOTE — Progress Notes (Signed)
Occupational Therapy Treatment Patient Details Name: Warren Mcdonald MRN: [id number] DOB: 12-Jul-1929 Today's Date: 07/05/2019    History of present illness 86 year old male s/p non healing wound requiring open transmetatarsal amputation of left third, fourth and fifth. 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 PMHx of popliteal to posterior tibial artery bypass, PVD, HTN, T2DM.   OT comments  Upon arrival, pt supine in bed using bedpan. Pt requiring Total A for peri care and Min-Mod A for bed mobility. Pt performing lateral scoot to drop arm recliner with Min A; discussed ways this technique can be used for [facility] and w/c. Pt requiring Max A +2 for sit<>Stand with RW. Continues to recommend dc to CIR as pt would benefit from intensive rehab before transition to home for optimziing his safety and functional performance as well as decrease caregiver burden. Pt declining CIR and he will require HHOT, drop arm BSC, and w/c. Will continue to follow acutely as admitted.   Follow Up Recommendations  CIR;Supervision/Assistance - 24 hour;Home health OT    Equipment Recommendations  3 in 1 bedside commode;Wheelchair (measurements OT);Wheelchair cushion (measurements OT)(drop arm)    Recommendations for Other Services Rehab consult    Precautions / Restrictions Precautions Precautions: Fall Precaution Comments: watch HR Restrictions Weight Bearing Restrictions: Yes LLE Weight Bearing: Non weight bearing       Mobility Bed Mobility Overal bed mobility: Needs Assistance Bed Mobility: Rolling;Sidelying to Sit Rolling: Min assist Sidelying to sit: Mod assist       General bed mobility comments: cues for sequencing/technique; use of rail; assist to elevate trunk into sitting   Transfers Overall transfer level: Needs assistance Equipment used: Rolling walker (2 wheeled) Transfers: Sit to/from Stand;Lateral/Scoot Transfers Sit to Stand: +2 physical assistance;Max assist        Lateral/Scoot Transfers: Min  assist General transfer comment: pt able to scoot laterally toward R side to drop arm recliner with cues for hand placement and maintaining trunk anteriorly throughout transfer; pt able to stand X 2 trials from recliner with cues for hand placement/positioning each trial    Balance Overall balance assessment: Needs assistance Sitting-balance support: Feet supported;Bilateral upper extremity supported Sitting balance-Leahy Scale: Poor     Standing balance support: Bilateral upper extremity supported Standing balance-Leahy Scale: Poor                             ADL either performed or assessed with clinical judgement   ADL Overall ADL's : Needs assistance/impaired                         Toilet Transfer: Minimal assistance;Transfer board(simulated to drop arm recliner) Toilet Transfer Details (indicate cue type and reason): Min A for posterior lean.  Toileting- Clothing Manipulation and Hygiene: Total assistance;Bed level Toileting - Clothing Manipulation Details (indicate cue type and reason): Total A to clean up after using bedpan.      Functional mobility during ADLs: Minimal assistance;+2 for safety/equipment(lateral scoots) General ADL Comments: Pt performing lateral scoot to recliner. Discussed functional use of this technique for w/c and bsc     Vision       Perception     Praxis      Cognition Arousal/Alertness: Awake/alert Behavior During Therapy: WFL for tasks assessed/performed Overall Cognitive Status: Within Functional Limits for tasks assessed  General Comments: HOH and needs repetition of cues; [facility] for basic mobility tasks. slightly difficult to understand at times. very appreciative of session        Exercises     Shoulder Instructions       General Comments Wife present throughout    Pertinent Vitals/ Pain       Pain Assessment: Faces Faces Pain Scale: Hurts even more Pain  Location: L AKA  Pain Descriptors / Indicators: Grimacing;Guarding;Sore Pain Intervention(s): Monitored during session;Limited activity within patient's tolerance;Repositioned  Home Living                                          Prior Functioning/Environment              Frequency  Min 2X/week        Progress Toward Goals  OT Goals(current goals can now be found in the care plan section)  Progress towards OT goals: Progressing toward goals  Acute Rehab OT Goals Patient Stated Goal: home OT Goal Formulation: With patient Time For Goal Achievement: 07/18/19 Potential to Achieve Goals: Good ADL Goals Pt Will Perform Grooming: with set-up;sitting Pt Will Perform Lower Body Dressing: with min assist;sitting/lateral leans;with adaptive equipment Pt Will Transfer to Toilet: with min assist;squat pivot transfer;stand pivot transfer;with +2 assist;with transfer board;bedside commode Pt Will Perform Toileting - Clothing Manipulation and hygiene: with supervision;sitting/lateral leans Additional ADL Goal #1: Patient will complete bed mobility with supervision as percursor to ADLs.  Plan Discharge plan remains appropriate;Discharge plan needs to be updated    Co-evaluation    PT/OT/SLP Co-Evaluation/Treatment: Yes Reason for Co-Treatment: For patient/therapist safety;To address functional/ADL transfers PT goals addressed during session: Mobility/safety with mobility OT goals addressed during session: ADL's and self-care      AM-PAC OT "6 Clicks" Daily Activity     Outcome Measure   Help from another person eating meals?: None Help from another person taking care of personal grooming?: A Little Help from another person toileting, which includes using toliet, bedpan, or urinal?: Total Help from another person bathing (including washing, rinsing, drying)?: A Lot Help from another person to put on and taking off regular upper body clothing?: A Little Help  from another person to put on and taking off regular lower body clothing?: Total 6 Click Score: 14    End of Session Equipment Utilized During Treatment: Rolling walker;Gait belt  OT Visit Diagnosis: Unsteadiness on feet (R26.81);Muscle weakness (generalized) (M62.81)   Activity Tolerance Patient tolerated treatment well   Patient Left in chair;with call bell/phone within reach;with family/visitor present   Nurse Communication Mobility status        Time: 6644-0347 OT Time Calculation (min): 26 min  Charges: OT General Charges $OT Visit: 1 Visit OT Treatments $Self Care/Home Management : 8-22 mins  Saadiya Wilfong MSOT, OTR/L Acute Rehab Pager: [phone] Office: [phone]   Theodoro Grist Reta Norgren 07/05/2019, 5:28 PM

## 2019-07-05 NOTE — Progress Notes (Signed)
Orthopedic Tech Progress Note Patient Details:  Warren Mcdonald 12/29/1929 [id number] Called in order to [facility] for a RETENTION SOCK with [facility]T  Patient ID: Warren Mcdonald, male   DOB: 02-02-1930, 86 y.o.   MRN: [id number]   [name] 07/05/2019, 8:09 AM

## 2019-07-05 NOTE — Consult Note (Signed)
PV Navigator consult acknowledged and chart reviewed.   86 y/o male admitted through ED for non healing L 4th toe amputation site and ischemic L 5th toe on 06/25/19. His home health nurse recommended he go to ED due to sites not healing. Patient had been wearing a Darco shoe w/wt bearing on LLE. Multiple efforts of limb salvage unsuccessful resulting in L AKA 07/02/19.  PMH significant for DM-2, PAD, HTN, HLD, AF, Balloon angioplasty L PTA and bypass graft stenosis 05/24/19. Followed by L 4th toe amputation 06/16/19. Amputation L 3rd and 5th toes 06/26/19. L incisional debridement L TMA 3rd, 4th, 5th toe sites 06/30/19.   Patient goal is to return to home with home health services.He may need acute home health nursing services to follow incision site until can return for follow up with surgeon.  He has family available to assist/support at home. OT recommendations of RW ; BSC  for home DME.  No other questions/barriers voiced. Contact information left with patients' wife should questions arise.   Thank you,  Hilma Favors RN BSN CWS PV Navigator [facility] [phone]

## 2019-07-06 LAB — BASIC METABOLIC PANEL
Anion gap: 11 (ref 5–15)
BUN: 19 mg/dL (ref 8–23)
CO2: 25 mmol/L (ref 22–32)
Calcium: 8.7 mg/dL — ABNORMAL LOW (ref 8.9–10.3)
Chloride: 101 mmol/L (ref 98–111)
Creatinine, Ser: 1.15 mg/dL (ref 0.61–1.24)
GFR calc Af Amer: >60 mL/min (ref >60)
GFR calc non Af Amer: 57 mL/min — ABNORMAL LOW (ref >60)
Glucose, Bld: 242 mg/dL — ABNORMAL HIGH (ref 70–99)
Potassium: 3.9 mmol/L (ref 3.5–5.1)
Sodium: 137 mmol/L (ref 135–145)

## 2019-07-06 LAB — GLUCOSE, CAPILLARY
Glucose-Capillary: 252 mg/dL — ABNORMAL HIGH (ref 70–99)
Glucose-Capillary: 196 mg/dL — ABNORMAL HIGH (ref 70–99)
Glucose-Capillary: 220 mg/dL — ABNORMAL HIGH (ref 70–99)
Glucose-Capillary: 193 mg/dL — ABNORMAL HIGH (ref 70–99)
Glucose-Capillary: 231 mg/dL — ABNORMAL HIGH (ref 70–99)

## 2019-07-06 MED ORDER — DILTIAZEM HCL ER COATED BEADS 180 MG PO CP24
180.0000 mg | ORAL_CAPSULE | Freq: Every day | ORAL | Status: DC
  Administered 2019-07-07 – 2019-07-09 (×3): 180 mg via ORAL
  Filled 2019-07-06 (×3): qty 1

## 2019-07-06 MED ORDER — METOPROLOL SUCCINATE ER 50 MG PO TB24
50.0000 mg | ORAL_TABLET | Freq: Once | ORAL | Status: AC
  Administered 2019-07-06: 50 mg via ORAL
  Filled 2019-07-06: qty 1

## 2019-07-06 MED ORDER — METOPROLOL SUCCINATE ER 100 MG PO TB24
100.0000 mg | ORAL_TABLET | Freq: Every day | ORAL | Status: DC
  Administered 2019-07-07 – 2019-07-09 (×3): 100 mg via ORAL
  Filled 2019-07-06 (×3): qty 1

## 2019-07-06 NOTE — Plan of Care (Signed)
Poc progressing.  

## 2019-07-06 NOTE — Progress Notes (Signed)
Occupational Therapy Treatment Patient Details Name: Warren Mcdonald MRN: [id number] DOB: 12/20/1929 Today's Date: 07/06/2019    History of present illness 86 year old male s/p non healing wound requiring open transmetatarsal amputation of left third, fourth and fifth. 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 PMHx of popliteal to posterior tibial artery bypass, PVD, HTN, T2DM.   OT comments  Pt making steady progress towards OT goals this session. Session focus on functional transfer training via x2 sit<>stand from EOB and BSC and lateral scoot txfr with slide board from recliner>EOB. Overall, pt requires MOD- MAX A +2 for sit<>stand with RW to switch out [facility] >recliner. Pt required total A for pericare in standing post BM. Pt required MIN - MOD A+ 2 for lateral scoot txfr back to bed. Noted pt declined CIR placement, feel pt would need HHOT and DME listed below if pt continues to decline. Will continue to follow acutely per POC.    Follow Up Recommendations  CIR;Supervision/Assistance - 24 hour;Home health OT    Equipment Recommendations  3 in 1 bedside commode;Wheelchair (measurements OT);Wheelchair cushion (measurements OT);Other (comment)(drop arm)    Recommendations for Other Services      Precautions / Restrictions Precautions Precautions: Fall Precaution Comments: watch HR Restrictions Weight Bearing Restrictions: Yes LLE Weight Bearing: Non weight bearing       Mobility Bed Mobility Overal bed mobility: Needs Assistance Bed Mobility: Supine to Sit;Sit to Supine     Supine to sit: Min assist;+2 for physical assistance Sit to supine: Min assist;+2 for physical assistance   General bed mobility comments: cues for sequencing/technique; use of rail; assist to elevate trunk into sitting, assist to bring BLES back to supine  Transfers Overall transfer level: Needs assistance Equipment used: Rolling walker (2 wheeled);Sliding board Transfers: Sit to/from Stand;Lateral/Scoot Transfers Sit to  Stand: +2 physical assistance;Max assist;Mod assist        Lateral/Scoot Transfers: Min assist;Mod assist;+2 physical assistance;With slide board General transfer comment: pt sit<>stand from EOB initially MOD A+2, but needing MAX A +2 to power into standing from [facility] needing tactile cues for hand placment. pt completed lateral scoot transfer from recliner>EOB with slide board with MIN +2 needing MOD cues for hand placment and overall technique to shift weight anteriorly during txfr    Balance Overall balance assessment: Needs assistance Sitting-balance support: Feet supported;Bilateral upper extremity supported Sitting balance-Leahy Scale: Poor Sitting balance - Comments: min guard to supervision statically   Standing balance support: Bilateral upper extremity supported Standing balance-Leahy Scale: Poor Standing balance comment: Requires BUE support and external support                           ADL either performed or assessed with clinical judgement   ADL Overall ADL's : Needs assistance/impaired                         Toilet Transfer: Moderate assistance;+2 for physical assistance;+2 for safety/equipment;Maximal assistance;BSC;RW Toilet Transfer Details (indicate cue type and reason): pt sit<>stand from EOB and pulled up BSC behind pt; MOD- MAX A +2 for sit<>stand from EOB/ [facility] Toileting- Clothing Manipulation and Hygiene: Total assistance;Sit to/from stand Toileting - Clothing Manipulation Details (indicate cue type and reason): pt able to sit<>stand for posterior pericare after BM needing total A     Functional mobility during ADLs: Minimal assistance;+2 for safety/equipment;Moderate assistance(lateral scoot with slide board) General ADL Comments: session focus on toilet transfer to [facility] via sit<>stand from  EOB while pulling BSC up behind pt and lateral scoot txfr from recliner to EOB with slide board     Vision       Perception     Praxis       Cognition Arousal/Alertness: Awake/alert Behavior During Therapy: WFL for tasks assessed/performed Overall Cognitive Status: Within Functional Limits for tasks assessed                                 General Comments: HOH and needs repetition of cues; [facility] for basic mobility tasks. slightly difficult to understand at times. very appreciative of session        Exercises     Shoulder Instructions       General Comments pt had BM on BSC; bed sheets and residual limb wrap noted to be soiled, changed bed sheets and alerted RN of wet limb wrap    Pertinent Vitals/ Pain       Pain Assessment: Faces Faces Pain Scale: Hurts even more Pain Location: L AKA  Pain Descriptors / Indicators: Grimacing;Guarding;Sore Pain Intervention(s): Limited activity within patient's tolerance;Monitored during session;Repositioned  Home Living                                          Prior Functioning/Environment              Frequency  Min 2X/week        Progress Toward Goals  OT Goals(current goals can now be found in the care plan section)  Progress towards OT goals: Progressing toward goals  Acute Rehab OT Goals Patient Stated Goal: home OT Goal Formulation: With patient Time For Goal Achievement: 07/18/19 Potential to Achieve Goals: Good  Plan Discharge plan remains appropriate    Co-evaluation                 AM-PAC OT "6 Clicks" Daily Activity     Outcome Measure   Help from another person eating meals?: None Help from another person taking care of personal grooming?: A Little Help from another person toileting, which includes using toliet, bedpan, or urinal?: A Lot Help from another person bathing (including washing, rinsing, drying)?: A Lot Help from another person to put on and taking off regular upper body clothing?: A Little Help from another person to put on and taking off regular lower body clothing?: Total 6 Click Score:  15    End of Session Equipment Utilized During Treatment: Gait belt;Rolling walker;Other (comment)(slide board)  OT Visit Diagnosis: Unsteadiness on feet (R26.81);Muscle weakness (generalized) (M62.81)   Activity Tolerance Patient tolerated treatment well   Patient Left in bed;with call bell/phone within reach;with bed alarm set   Nurse Communication Mobility status        Time: 7169-6789 OT Time Calculation (min): 41 min  Charges: OT General Charges $OT Visit: 1 Visit OT Treatments $Self Care/Home Management : 8-22 mins ,Lanier Clam., COTA/L Acute Rehabilitation Services [phone] [place] 07/06/2019, 4:31 PM

## 2019-07-06 NOTE — Progress Notes (Addendum)
The patient has been seen in conjunction with Theodore Demark, PA-C. All aspects of care have been considered and discussed. The patient has been personally interviewed, examined, and all clinical data has been reviewed.   Still with borderline rate control.  We will switch from metoprolol tartrate to succinate, with a consolidated dose of 100 mg/day.  Continue diltiazem CD, and will increase to 180 mg/day.  Long-term anticoagulation to prevent stroke and will likely help prevent acute events as far as PAD is concerned.  There is data for low-dose rivaroxaban and Eliquis.  He needs full anticoagulation and therefore would prefer Eliquis.   At his age, no further cardiac work-up is needed.    Progress Note  Patient Name: Warren Mcdonald Date of Encounter: 07/06/2019  Primary Cardiologist: Dina Rich, MD   Subjective   Denies chest pain or SOB Leg not hurting today  Inpatient Medications    Scheduled Meds: . apixaban  5 mg Oral BID  . aspirin EC  81 mg Oral Daily  . diltiazem  120 mg Oral Daily  . docusate sodium  100 mg Oral Daily  . insulin aspart  0-15 Units Subcutaneous TID WC  . loratadine  10 mg Oral Daily  . metoprolol tartrate  50 mg Oral BID  . pantoprazole  40 mg Oral Daily  . potassium chloride  40 mEq Oral Daily   Continuous Infusions:  PRN Meds: acetaminophen **OR** acetaminophen, alum & mag hydroxide-simeth, bisacodyl, guaiFENesin-dextromethorphan, hydrALAZINE, labetalol, morphine injection, ondansetron, oxyCODONE-acetaminophen, phenol, polyethylene glycol   Vital Signs    Vitals:   07/05/19 0800 07/05/19 2000 07/06/19 0456 07/06/19 0756  BP: 129/81 132/63 127/74 132/76  Pulse: (!) 108 (!) 110 91 (!) 106  Resp:  20 19 20   Temp: 98.4 F (36.9 C) 98.3 F (36.8 C) 98.5 F (36.9 C) 98.6 F (37 C)  TempSrc: Oral Oral Oral Oral  SpO2: 99% 99% 100% 100%  Weight:   92.4 kg   Height:        Intake/Output Summary (Last 24 hours) at 07/06/2019 1034  Last data filed at 07/06/2019 0007 Gross per 24 hour  Intake 515.74 ml  Output 650 ml  Net -134.26 ml   Filed Weights   07/04/19 0500 07/05/19 0410 07/06/19 0456  Weight: 97.7 kg 94.6 kg 92.4 kg    Telemetry    Atrial fib, rate generally 90-110, will go higher at times, ?with activity, lowest during sleep, PVCs -  Personally Reviewed  ECG    No EKG to review - Personally Reviewed  Physical Exam   General: Well developed, well nourished, male in no acute distress Head: Eyes PERRLA, Head normocephalic and atraumatic Lungs: clear bilaterally to auscultation. Heart: Irreg R&R S1 S2, without rub or gallop. No murmur. Upper extremity pulses are 2+ & equal. No JVD. Abdomen: Bowel sounds are present, abdomen soft and non-tender without masses or  hernias noted. Msk: Normal strength and tone for age. Extremities: No clubbing, cyanosis or edema. S/p L-AKA Skin:  No rashes or lesions noted. Neuro: Alert and oriented X 3. Psych:  Good affect, responds appropriately  Labs    Chemistry Recent Labs  Lab 07/04/19 0334 07/05/19 1047 07/06/19 0229  NA 138 137 137  K 3.2* 3.6 3.9  CL 102 104 101  CO2 25 21* 25  GLUCOSE 212* 150* 242*  BUN 19 20 19   CREATININE 1.15 1.18 1.15  CALCIUM 8.4* 8.7* 8.7*  GFRNONAA 57* 55* 57*  GFRAA >60 >60 >60  ANIONGAP 11 12 11      Hematology Recent Labs  Lab 07/02/19 1807 07/03/19 0256 07/04/19 0334  WBC 8.9 11.2* 9.1  RBC 3.45* 3.38* 3.38*  HGB 10.0* 10.0* 9.9*  HCT 31.6* 30.5* 30.5*  MCV 91.6 90.2 90.2  MCH 29.0 29.6 29.3  MCHC 31.6 32.8 32.5  RDW 13.7 13.4 13.6  PLT 374 383 410*    Cardiac Enzymes High Sensitivity Troponin:   Recent Labs  Lab 07/02/19 1807  TROPONINIHS 8     BNPNo results for input(s): BNP, PROBNP in the last 168 hours.   Lab Results  Component Value Date   TSH 2.795 07/05/2019   Magnesium  Date Value Ref Range Status  07/05/2019 2.0 1.7 - 2.4 mg/dL Final    Comment:    Performed at [facility], [place] [address], [place], [place] [address]  07/04/2019 2.0 1.7 - 2.4 mg/dL Final    Comment:    Performed at [facility] [address] [place], [place] [address]  07/02/2019 1.6 (L) 1.7 - 2.4 mg/dL Final    Comment:    Performed at [facility] [address] [place], [place] [address]   Lab Results  Component Value Date   CHOL 142 07/05/2019   HDL 30 (L) 07/05/2019   LDLCALC 92 07/05/2019   TRIG 100 07/05/2019   CHOLHDL 4.7 07/05/2019    Radiology    No results found.  Cardiac Studies   2D echo 07/03/2019 IMPRESSIONS    1. Left ventricular ejection fraction, by visual estimation, is 40 to  45%. The left ventricle has moderately decreased function. There is mildly  increased left ventricular hypertrophy.  2. Left ventricular diastolic function could not be evaluated.  3. Mildly dilated left ventricular internal cavity size.  4. The left ventricle demonstrates global hypokinesis.  5. Global right ventricle has mildly reduced systolic function.The right  ventricular size is normal. No increase in right ventricular wall  thickness.  6. Left atrial size was moderately dilated.  7. Right atrial size was normal.  8. The mitral valve is abnormal. Mild to moderate mitral valve  regurgitation.  9. The tricuspid valve is grossly normal.  10. The tricuspid valve is grossly normal. Tricuspid valve regurgitation  is moderate.  11. The aortic valve is tricuspid. Aortic valve regurgitation is not  visualized. Mild aortic valve sclerosis without stenosis.  12. The pulmonic valve was grossly normal. Pulmonic valve regurgitation is  not visualized.   Patient Profile     86 y.o. male with a hx of PAF with CHADS2VASC score of 5 on chronic anticoagulation, chronic diastolic CHF and HTN.  He has an extensive hx of PAD s/p left above-knee popliteal to posterior tibial artery bypass graft in 2011 and angioplasty of occluded right tibioperoneal trunk and  posterior tibial artery in 2010 followed by Vascular, DM2, HLD. He has recently had several hospital admissions for vascular complications.  Developed afib with RVR post op.    Assessment & Plan    1.  Atrial fibrillation with RVR - plan is rate control - BP limits med titration - had been off Eliquis, restarted - on Cardizem CD 120 mg qd and metoprolol 50 mg bid - EF 40-45% on echo (prev nl) - TSH ok - discuss w/ MD if we might be able to increase Cardizem  2.  HTN - off amlodipine, on Cardizem - SBP 105-132 last 24 hr - on metoprolol also  3.  Chronic  diastolic CHF - no volume overload - wt stable, trending down - follow, not on diuretics  4.  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  Hypokalemia - not on diuretics - supplemented and improved - need to follow, now on 40 meq qd  7.  Ventricular tachycardia - has had 3 bt runs and PVCs/pairs, decreased by rx - Lopressor increased to 50 mg bid on 02/07 - Keep K+ > 4.0 and Mg > 2.0  - no symptoms, no hx syncope  8.  Mild LV dysfunction - possibly tachy-mediated, no hx inschemic sx - hx PAD, high likelihood of CAD - on BB, Eliquis - LDL 92, HDL 30  - MD advise on adding Lipitor 20 mg qd - MD advise on d/c ASA because of Eliquis   For questions or updates, please contact [facility] Please consult [email or web address] for contact info under Cardiology/STEMI.      Signed, Theodore Demark, PA-C  07/06/2019, 10:34 AM

## 2019-07-06 NOTE — Progress Notes (Addendum)
  Progress Note    07/06/2019 7:32 AM 4 Days Post-Op  Subjective: States no pain over night or this morning L AKA   Vitals:   07/05/19 2000 07/06/19 0456  BP: 132/63 127/74  Pulse: (!) 110 91  Resp: 20 19  Temp: 98.3 F (36.8 C) 98.5 F (36.9 C)  SpO2: 99% 100%   Physical Exam: General: Well appearing, not in any distress Lungs:  Non labored, equal expansion Incisions:  Left AKA clean, dry, intact. Non tender, no fluid collections or drainage Abdomen:  Distended, non tender Neurologic: alert and oriented  CBC    Component Value Date/Time   WBC 9.1 07/04/2019 0334   RBC 3.38 (L) 07/04/2019 0334   HGB 9.9 (L) 07/04/2019 0334   HCT 30.5 (L) 07/04/2019 0334   PLT 410 (H) 07/04/2019 0334   MCV 90.2 07/04/2019 0334   MCH 29.3 07/04/2019 0334   MCHC 32.5 07/04/2019 0334   RDW 13.6 07/04/2019 0334   LYMPHSABS 1.2 09/10/2014 0433   MONOABS 0.8 09/10/2014 0433   EOSABS 0.2 09/10/2014 0433   BASOSABS 0.1 09/10/2014 0433    BMET    Component Value Date/Time   NA 137 07/06/2019 0229   NA 138 11/03/2017 0000   K 3.9 07/06/2019 0229   CL 101 07/06/2019 0229   CO2 25 07/06/2019 0229   GLUCOSE 242 (H) 07/06/2019 0229   BUN 19 07/06/2019 0229   BUN 23 (A) 11/03/2017 0000   CREATININE 1.15 07/06/2019 0229   CALCIUM 8.7 (L) 07/06/2019 0229   GFRNONAA 57 (L) 07/06/2019 0229   GFRAA >60 07/06/2019 0229    INR    Component Value Date/Time   INR 1.1 06/16/2019 0857     Intake/Output Summary (Last 24 hours) at 07/06/2019 0732 Last data filed at 07/06/2019 0007 Gross per 24 hour  Intake 515.74 ml  Output 650 ml  Net -134.26 ml     Assessment/Plan:  86 y.o. male is s/p L AKA 4 Days Post-Op. Left above knee amputation intact and healing well. PT/OT recommend CIR but patient wishes to go home with family support with HH and HHPT. Will place order for [facility] and HHPT. Cardiology assisting with management of patients A fib with RVR , awaiting clearance. Disposition  pending   DVT prophylaxis:  Apixaban   Graceann Congress, PA-C [facility] [phone] 07/06/2019 7:32 AM   I agree with the above.  I have seen and evaluated the patient   Durene Cal

## 2019-07-06 NOTE — Progress Notes (Signed)
Inpatient Diabetes Program Recommendations  AACE/ADA: New Consensus Statement on Inpatient Glycemic Control (2015)  Target Ranges:  Prepandial:   less than 140 mg/dL      Peak postprandial:   less than 180 mg/dL (1-2 hours)      Critically ill patients:  140 - 180 mg/dL   Lab Results  Component Value Date   GLUCAP 220 (H) 07/06/2019   HGBA1C 7.8 (H) 06/25/2019    Review of Glycemic Control  Results for Warren Mcdonald, Warren Mcdonald (MRN [id number]) as of 07/06/2019 12:48  Ref. Range 07/05/2019 10:28 07/05/2019 16:50 07/05/2019 21:11 07/06/2019 06:15 07/06/2019 11:25  Glucose-Capillary Latest Ref Range: 70 - 99 mg/dL 320 (H) 233 (H) 435 (H) 196 (H) 220 (H)    Diabetes history: DM2 Outpatient Diabetes medications: Metformin 500 mg BID Current orders for Inpatient glycemic control: Novovlog 0-15 TID   Inpatient Diabetes Program Recommendations:     -Please consider Novolog 3 units TID with meals if eats at least  50% and CBG >80mg /dl    Thank you, Dulce Sellar, RN, BSN Diabetes Coordinator Inpatient Diabetes Program [phone] (team pager from 8a-5p)

## 2019-07-06 NOTE — Progress Notes (Signed)
Physical Therapy Treatment Patient Details Name: Warren Mcdonald MRN: [id number] DOB: 10/29/1929 Today's Date: 07/06/2019    History of Present Illness 86 year old male s/p non healing wound requiring open transmetatarsal amputation of left third, fourth and fifth. 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 PMHx of popliteal to posterior tibial artery bypass, PVD, HTN, T2DM.    PT Comments    Patient seen for mobility progression. Pt tolerated increased mobility well and able to practice multiple functional transfers this session. Pt continues to be a good candidate for CIR level therapies however if continues to decline will need HHPT services to maximize independence and safety with mobility. PT will continue to follow acutely and progress as tolerated.     Follow Up Recommendations  CIR     Equipment Recommendations  Wheelchair (measurements PT);Wheelchair cushion (measurements PT);Other (comment)    Recommendations for Other Services Rehab consult     Precautions / Restrictions Precautions Precautions: Fall Precaution Comments: watch HR Restrictions Weight Bearing Restrictions: Yes LLE Weight Bearing: Non weight bearing    Mobility  Bed Mobility Overal bed mobility: Needs Assistance Bed Mobility: Supine to Sit;Sit to Supine     Supine to sit: Min assist;+2 for physical assistance Sit to supine: Min assist;+2 for physical assistance   General bed mobility comments: cues for sequencing/technique; use of rail; assist to elevate trunk into sitting, assist to bring BLES back to supine and scoot hips onto bed with bed pad  Transfers Overall transfer level: Needs assistance Equipment used: Rolling walker (2 wheeled);Sliding board Transfers: Sit to/from Stand;Lateral/Scoot Transfers Sit to Stand: +2 physical assistance;Max assist;Mod assist        Lateral/Scoot Transfers: Min assist;Mod assist;+2 physical assistance;With slide board General transfer comment: pt sit<>stand from EOB initially  MOD A+2 and BSC brought up behing pt to sit; MAX A +2 to power into standing from [facility] needing tactile cues for hand placment; pt stood for pericare and recliner brought up behind pt; pt completed lateral scoot transfer from recliner>EOB with slide board with MIN +2 needing MOD cues for hand placment and overall technique to shift weight anteriorly throughout transfer  Ambulation/Gait                 Stairs             Wheelchair Mobility    Modified Rankin (Stroke Patients Only)       Balance Overall balance assessment: Needs assistance Sitting-balance support: Feet supported;Bilateral upper extremity supported Sitting balance-Leahy Scale: Poor Sitting balance - Comments: min guard to supervision statically   Standing balance support: Bilateral upper extremity supported Standing balance-Leahy Scale: Poor Standing balance comment: Requires BUE support and external support                            Cognition Arousal/Alertness: Awake/alert Behavior During Therapy: WFL for tasks assessed/performed Overall Cognitive Status: Within Functional Limits for tasks assessed                                 General Comments: HOH and needs repetition of cues; [facility] for basic mobility tasks. slightly difficult to understand at times. very appreciative of session      Exercises      General Comments General comments (skin integrity, edema, etc.): pt had BM on BSC; bed sheets and residual limb wrap noted to be soiled, changed bed sheets and alerted RN of wet  limb wrap      Pertinent Vitals/Pain Pain Assessment: Faces Faces Pain Scale: Hurts even more Pain Location: L AKA  Pain Descriptors / Indicators: Grimacing;Guarding;Sore Pain Intervention(s): Limited activity within patient's tolerance;Monitored during session;Repositioned    Home Living                      Prior Function            PT Goals (current goals can now be found in  the care plan section) Acute Rehab PT Goals Patient Stated Goal: home Progress towards PT goals: Progressing toward goals    Frequency    Min 3X/week      PT Plan Current plan remains appropriate    Co-evaluation PT/OT/SLP Co-Evaluation/Treatment: Yes Reason for Co-Treatment: For patient/therapist safety;To address functional/ADL transfers PT goals addressed during session: Mobility/safety with mobility;Balance        AM-PAC PT "6 Clicks" Mobility   Outcome Measure  Help needed turning from your back to your side while in a flat bed without using bedrails?: A Little Help needed moving from lying on your back to sitting on the side of a flat bed without using bedrails?: A Little Help needed moving to and from a bed to a chair (including a wheelchair)?: A Lot Help needed standing up from a chair using your arms (e.g., wheelchair or bedside chair)?: A Lot Help needed to walk in hospital room?: Total Help needed climbing 3-5 steps with a railing? : Total 6 Click Score: 12    End of Session Equipment Utilized During Treatment: Gait belt Activity Tolerance: Patient tolerated treatment well Patient left: in bed;with call bell/phone within reach Nurse Communication: Mobility status PT Visit Diagnosis: Other abnormalities of gait and mobility (R26.89);Muscle weakness (generalized) (M62.81);Difficulty in walking, not elsewhere classified (R26.2);Pain Pain - Right/Left: Left Pain - part of body: Leg     Time: 7619-5093 PT Time Calculation (min) (ACUTE ONLY): 41 min  Charges:  $Gait Training: 8-22 mins $Therapeutic Activity: 8-22 mins                     Erline Levine, PTA Acute Rehabilitation Services Pager: [phone] Office: [phone]     Carolynne Edouard 07/06/2019, 4:47 PM

## 2019-07-07 LAB — GLUCOSE, CAPILLARY
Glucose-Capillary: 211 mg/dL — ABNORMAL HIGH (ref 70–99)
Glucose-Capillary: 209 mg/dL — ABNORMAL HIGH (ref 70–99)
Glucose-Capillary: 144 mg/dL — ABNORMAL HIGH (ref 70–99)
Glucose-Capillary: 229 mg/dL — ABNORMAL HIGH (ref 70–99)

## 2019-07-07 LAB — BASIC METABOLIC PANEL
Anion gap: 7 (ref 5–15)
BUN: 17 mg/dL (ref 8–23)
CO2: 26 mmol/L (ref 22–32)
Calcium: 8.6 mg/dL — ABNORMAL LOW (ref 8.9–10.3)
Chloride: 103 mmol/L (ref 98–111)
Creatinine, Ser: 1.13 mg/dL (ref 0.61–1.24)
GFR calc Af Amer: >60 mL/min (ref >60)
GFR calc non Af Amer: 58 mL/min — ABNORMAL LOW (ref >60)
Glucose, Bld: 258 mg/dL — ABNORMAL HIGH (ref 70–99)
Potassium: 3.9 mmol/L (ref 3.5–5.1)
Sodium: 136 mmol/L (ref 135–145)

## 2019-07-07 MED ORDER — ATORVASTATIN CALCIUM 10 MG PO TABS
20.0000 mg | ORAL_TABLET | Freq: Every day | ORAL | Status: DC
  Administered 2019-07-07 – 2019-07-08 (×2): 20 mg via ORAL
  Filled 2019-07-07 (×2): qty 2

## 2019-07-07 NOTE — Progress Notes (Addendum)
  Progress Note    07/07/2019 7:19 AM 5 Days Post-Op  Subjective:  No pain in Left aka stump   Vitals:   07/06/19 2140 07/07/19 0400  BP:  128/77  Pulse: 94 85  Resp:  16  Temp:  98.4 F (36.9 C)  SpO2:  100%   Physical Exam: General: Alert and oriented, well appearing, not in any discomfort Lungs:  Non labored Incisions:  Intact, clean and dry, no collections, no tenderness Extremities:  Left femoral pulse 3+ Abdomen:  Distended, non tender  CBC    Component Value Date/Time   WBC 9.1 07/04/2019 0334   RBC 3.38 (L) 07/04/2019 0334   HGB 9.9 (L) 07/04/2019 0334   HCT 30.5 (L) 07/04/2019 0334   PLT 410 (H) 07/04/2019 0334   MCV 90.2 07/04/2019 0334   MCH 29.3 07/04/2019 0334   MCHC 32.5 07/04/2019 0334   RDW 13.6 07/04/2019 0334   LYMPHSABS 1.2 09/10/2014 0433   MONOABS 0.8 09/10/2014 0433   EOSABS 0.2 09/10/2014 0433   BASOSABS 0.1 09/10/2014 0433    BMET    Component Value Date/Time   NA 136 07/07/2019 0307   NA 138 11/03/2017 0000   K 3.9 07/07/2019 0307   CL 103 07/07/2019 0307   CO2 26 07/07/2019 0307   GLUCOSE 258 (H) 07/07/2019 0307   BUN 17 07/07/2019 0307   BUN 23 (A) 11/03/2017 0000   CREATININE 1.13 07/07/2019 0307   CALCIUM 8.6 (L) 07/07/2019 0307   GFRNONAA 58 (L) 07/07/2019 0307   GFRAA >60 07/07/2019 0307    INR    Component Value Date/Time   INR 1.1 06/16/2019 0857    No intake or output data in the 24 hours ending 07/07/19 0719   Assessment/Plan:  86 y.o. male is s/p left above knee amputation 5 Days Post-Op. Doing well from surgical standpoint. PT/OT recommend CIR but patient has a lot of support at home and wishes to go home. Will have HHPT set up. Switched from metoprolol tartrate to succinate for rate control as well as diltiazem. Some limitations on titration due to hypotension. Apixiban restarted. Hypokalemia improved. Will add lipitor per Cardio recommendation. Cardiology feels no further workup needed at this point. Should  be ready for d/c today once seen by MD   DVT prophylaxis:  Apixiban   Graceann Congress, PA-C [facility] [phone] 07/07/2019 7:19 AM   I agree with the above.  We are planning on d/c home, once medical issues resolved  Durene Cal

## 2019-07-07 NOTE — Progress Notes (Addendum)
The patient has been seen in conjunction with Nada Boozer, NP. All aspects of care have been considered and discussed. The patient has been personally interviewed, examined, and all clinical data has been reviewed.   Metoprolol succinate is preferred.  Rate control can be further optimized as an outpatient.  Okay to discontinue aspirin and continue apixaban.   Progress Note  Patient Name: Warren Mcdonald Date of Encounter: 07/07/2019  Primary Cardiologist: Dina Rich, MD   Subjective   Feels pretty well. No chest pain or SOB  Inpatient Medications    Scheduled Meds: . apixaban  5 mg Oral BID  . aspirin EC  81 mg Oral Daily  . atorvastatin  20 mg Oral q1800  . diltiazem  180 mg Oral Daily  . docusate sodium  100 mg Oral Daily  . insulin aspart  0-15 Units Subcutaneous TID WC  . loratadine  10 mg Oral Daily  . metoprolol succinate  100 mg Oral Daily  . metoprolol tartrate  50 mg Oral BID  . pantoprazole  40 mg Oral Daily  . potassium chloride  40 mEq Oral Daily   Continuous Infusions:  PRN Meds: acetaminophen **OR** acetaminophen, alum & mag hydroxide-simeth, bisacodyl, guaiFENesin-dextromethorphan, hydrALAZINE, labetalol, morphine injection, ondansetron, oxyCODONE-acetaminophen, phenol, polyethylene glycol   Vital Signs    Vitals:   07/06/19 1125 07/06/19 2015 07/06/19 2140 07/07/19 0400  BP: 137/84 123/72  128/77  Pulse: (!) 114 73 94 85  Resp: 20 20  16   Temp: 98.1 F (36.7 C) 97.9 F (36.6 C)  98.4 F (36.9 C)  TempSrc: Oral Oral  Oral  SpO2: 100% 100%  100%  Weight:    90.8 kg  Height:       No intake or output data in the 24 hours ending 07/07/19 0751 Last 3 Weights 07/07/2019 07/06/2019 07/05/2019  Weight (lbs) 200 lb 2.8 oz 203 lb 11.3 oz 208 lb 8.9 oz  Weight (kg) 90.8 kg 92.4 kg 94.6 kg      Telemetry    A fib with HR average < 100  85 or so occ to 110   - Personally Reviewed  ECG    No new - Personally Reviewed  Physical Exam    GEN: No acute distress.   Neck: No JVD Cardiac: irreg irreg, no murmurs, rubs, or gallops.  Respiratory: Clear to auscultation bilaterally. GI: Soft, nontender, non-distended  MS: No edema; No deformity. Neuro:  Nonfocal  Psych: Normal affect   Labs    High Sensitivity Troponin:   Recent Labs  Lab 07/02/19 1807  TROPONINIHS 8      Chemistry Recent Labs  Lab 07/05/19 1047 07/06/19 0229 07/07/19 0307  NA 137 137 136  K 3.6 3.9 3.9  CL 104 101 103  CO2 21* 25 26  GLUCOSE 150* 242* 258*  BUN 20 19 17   CREATININE 1.18 1.15 1.13  CALCIUM 8.7* 8.7* 8.6*  GFRNONAA 55* 57* 58*  GFRAA >60 >60 >60  ANIONGAP 12 11 7      Hematology Recent Labs  Lab 07/02/19 1807 07/03/19 0256 07/04/19 0334  WBC 8.9 11.2* 9.1  RBC 3.45* 3.38* 3.38*  HGB 10.0* 10.0* 9.9*  HCT 31.6* 30.5* 30.5*  MCV 91.6 90.2 90.2  MCH 29.0 29.6 29.3  MCHC 31.6 32.8 32.5  RDW 13.7 13.4 13.6  PLT 374 383 410*    BNPNo results for input(s): BNP, PROBNP in the last 168 hours.   DDimer No results for input(s): DDIMER in the last  168 hours.   Radiology    No results found.  Cardiac Studies   2D echo 07/03/2019 IMPRESSIONS    1. Left ventricular ejection fraction, by visual estimation, is 40 to  45%. The left ventricle has moderately decreased function. There is mildly  increased left ventricular hypertrophy.  2. Left ventricular diastolic function could not be evaluated.  3. Mildly dilated left ventricular internal cavity size.  4. The left ventricle demonstrates global hypokinesis.  5. Global right ventricle has mildly reduced systolic function.The right  ventricular size is normal. No increase in right ventricular wall  thickness.  6. Left atrial size was moderately dilated.  7. Right atrial size was normal.  8. The mitral valve is abnormal. Mild to moderate mitral valve  regurgitation.  9. The tricuspid valve is grossly normal.  10. The tricuspid valve is grossly normal.  Tricuspid valve regurgitation  is moderate.  11. The aortic valve is tricuspid. Aortic valve regurgitation is not  visualized. Mild aortic valve sclerosis without stenosis.  12. The pulmonic valve was grossly normal. Pulmonic valve regurgitation is  not visualized.    Patient Profile     86 y.o. male with a hx of PAF with CHADS2VASC score of 5 on chronic anticoagulation, chronic diastolic CHF and HTN. He has an extensive hx of PAD s/p left above-knee popliteal to posterior tibial artery bypass graft in 2011 and angioplasty of occluded right tibioperoneal trunk and posterior tibial artery in 2010 followed by Vascular, DM2, HLD. He has recently had several hospital admissions for vascular complications.  Developed afib with RVR post op  Assessment & Plan    1. Atrial fibrillation with RVR - plan is rate control - BP limits med titration - had been off Eliquis, restarted - on Cardizem CD 180 mg qd and * metoprolol XL of 100 mg and lopressor 50 BID  Would consolidate to either long acting or short acting (home was lopressor 25 BID)* - EF 40-45% on echo (prev nl) - TSH ok  2. HTN - off amlodipine, on Cardizem - SBP 123 to 128 last 24 hr - on metoprolol XL and tartrate. also  3. Chronic diastolic CHF - no volume overload - wt stable, trending down Pk 98.4 Kg now 90.8 Kg and neg 1635 cc - follow, not on diuretics  4. 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 Hypokalemia - not on diuretics - supplemented and improved - need to follow, now on 40 meq qd  7. Ventricular tachycardia - has had 3 bt runs and PVCs/pairs, decreased by rx--still with PVCs occ  - Lopressor increased to 50 mg bid on 02/07 - Keep K+ > 4.0 and Mg > 2.0  - no symptoms, no hx syncope  8.  Mild LV dysfunction - possibly tachy-mediated, no hx inschemic sx - hx PAD, high likelihood of CAD no ischemic work up this admit - on BB, Eliquis - LDL 92, HDL 30  -  MD advise on d/c ASA because of  Eliquis  9. HLD on lipitor 20 with LDL of 92             For questions or updates, please contact [place] Please consult [email or web address] for contact info under        Signed, Cecilie Kicks, NP  07/07/2019, 7:51 AM

## 2019-07-07 NOTE — Progress Notes (Addendum)
Inpatient Diabetes Program Recommendations  AACE/ADA: New Consensus Statement on Inpatient Glycemic Control (2015)  Target Ranges:  Prepandial:   less than 140 mg/dL      Peak postprandial:   less than 180 mg/dL (1-2 hours)      Critically ill patients:  140 - 180 mg/dL   Lab Results  Component Value Date   GLUCAP 211 (H) 07/07/2019   HGBA1C 7.8 (H) 06/25/2019    Review of Glycemic Control  Results for Warren Mcdonald, Warren Mcdonald (MRN [id number]) as of 07/07/2019 10:51  Ref. Range 07/06/2019 06:15 07/06/2019 11:25 07/06/2019 16:28 07/06/2019 21:12 07/07/2019 06:33  Glucose-Capillary Latest Ref Range: 70 - 99 mg/dL 409 (H) 811 (H) 914 (H) 231 (H) 211 (H)   Results for [name], [name] (MRN [id number]) as of 07/07/2019 13:19  Ref. Range 06/25/2019 20:05  Hemoglobin A1C Latest Ref Range: 4.8 - 5.6 % 7.8 (H)   Diabetes history: DM2 Current orders for Inpatient glycemic control:  Novolog 0-15 units TID  Inpatient Diabetes Program Recommendations:    Please consider -Levemir 5 units BID (0.1/kg)  Note: Spoke with patient at bedside regarding diabetes management, new left AKA and healing.  He states he checks his blood sugar 3 times a day.  He does  Not have any problems affording or obtaining his diabetes medication.  His granddaughter fills his pill box and he takes his medications regularly.  He drinks diet drinks and water and watches his CHO intake.  He is current with his PCP.  Encouraged patient to watch CHO intake and continue to monitor CBG's closely especially while healing.    Thank you, Dulce Sellar, RN, BSN Diabetes Coordinator Inpatient Diabetes Program [phone] (team pager from 8a-5p)

## 2019-07-08 LAB — GLUCOSE, CAPILLARY
Glucose-Capillary: 198 mg/dL — ABNORMAL HIGH (ref 70–99)
Glucose-Capillary: 252 mg/dL — ABNORMAL HIGH (ref 70–99)
Glucose-Capillary: 184 mg/dL — ABNORMAL HIGH (ref 70–99)
Glucose-Capillary: 114 mg/dL — ABNORMAL HIGH (ref 70–99)

## 2019-07-08 MED ORDER — INSULIN DETEMIR 100 UNIT/ML ~~LOC~~ SOLN
5.0000 [IU] | Freq: Two times a day (BID) | SUBCUTANEOUS | Status: DC
  Administered 2019-07-08 – 2019-07-09 (×3): 5 [IU] via SUBCUTANEOUS
  Filled 2019-07-08 (×4): qty 0.05

## 2019-07-08 NOTE — Discharge Instructions (Signed)

## 2019-07-08 NOTE — TOC Initial Note (Addendum)
Transition of Care (TOC) - Initial/Assessment Note  Donn Pierini RN, BSN Transitions of Care Unit 4E- RN Case Manager [phone]   Patient Details  Name: Warren Mcdonald MRN: [id number] Date of Birth: [birth year]  Transition of Care [facility]) CM/SW Contact:    Darrold Span, RN Phone Number: 07/08/2019, 4:30 PM  Clinical Narrative:                 Pt s/p BKA, wants to return home declining CIR or SNF-  PTA pt was active with [facility] and they will resume services on discharge- Orders have been placed- RN/PT. Pt will need DME- 3n1, wheelchair, slide board- orders have been placed- Call made to [facility] with [employer] for DME needs - all to be delivered to room prior to discharge- expected 2/12. - Noted family wanted ramp for home- [employer] can assist with this- 5 ft rental is $50/month, or 59ft rental is $75/month- CM has attempted to call Deniece Ree- granddaughter- listed as 1st call- will no answer- msg left with call back #.  CM will f/u.    Expected Discharge Plan: Home w Home Health Services Barriers to Discharge: No Barriers Identified   Patient Goals and CMS Choice Patient states their goals for this hospitalization and ongoing recovery are:: return home CMS Medicare.gov Compare Post Acute Care list provided to:: Patient Choice offered to / list presented to : Patient  Expected Discharge Plan and Services Expected Discharge Plan: Home w Home Health Services   Discharge Planning Services: CM Consult Post Acute Care Choice: Home Health, Resumption of Svcs/PTA Provider, Durable Medical Equipment Living arrangements for the past 2 months: Single Family Home                 DME Arranged: 3-N-1, Wheelchair manual, Other see comment DME Agency: [employer] Date DME Agency Contacted: 07/08/19 Time DME Agency Contacted: 1200 Representative spoke with at DME Agency: Ian Malkin HH Arranged: RN, PT HH Agency: [facility] Date HH Agency Contacted: 07/08/19 Time HH Agency  Contacted: 1100 Representative spoke with at [facility] Agency: Tiffany  Prior Living Arrangements/Services Living arrangements for the past 2 months: Single Family Home Lives with:: Self, Spouse Patient language and need for interpreter reviewed:: Yes Do you feel safe going back to the place where you live?: Yes      Need for Family Participation in Patient Care: Yes (Comment) Care giver support system in place?: Yes (comment) Current home services: DME Criminal Activity/Legal Involvement Pertinent to Current Situation/Hospitalization: No - Comment as needed  Activities of Daily Living Home Assistive Devices/Equipment: Cane (specify quad or straight), Other (Comment)(Darco Shoe) ADL Screening (condition at time of admission) Patient's cognitive ability adequate to safely complete daily activities?: Yes Is the patient deaf or have difficulty hearing?: Yes Does the patient have difficulty seeing, even when wearing glasses/contacts?: Yes Does the patient have difficulty concentrating, remembering, or making decisions?: No Patient able to express need for assistance with ADLs?: No Does the patient have difficulty dressing or bathing?: No Independently performs ADLs?: Yes (appropriate for developmental age) Does the patient have difficulty walking or climbing stairs?: No Weakness of Legs: Left Weakness of Arms/Hands: None  Permission Sought/Granted Permission sought to share information with : Facility Industrial/product designer granted to share information with : Yes, Verbal Permission Granted     Permission granted to share info w AGENCY: HH        Emotional Assessment Appearance:: Appears stated age Attitude/Demeanor/Rapport: Engaged Affect (typically observed): Appropriate Orientation: : Oriented to  Self, Oriented to Place, Oriented to  Time, Oriented to Situation   Psych Involvement: No (comment)  Admission diagnosis:  Nonhealing surgical wound [T81.89XA] Patient Active  Problem List   Diagnosis Date Noted  . PAF (paroxysmal atrial fibrillation) ([place])   . Nonhealing surgical wound 06/25/2019  . Essential (primary) hypertension   . Hyperlipidemia, unspecified   . Type 2 diabetes mellitus with diabetic peripheral angiopathy without gangrene ([place])   . Chronic diastolic (congestive) heart failure ([place])   . Acute diastolic heart failure ([place]) 09/12/2014  . Cholecystitis, acute 09/06/2014  . Atrial fibrillation with rapid ventricular response ([place]) 09/06/2014  . 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 Abdominal pain 09/02/2014  . DM (diabetes mellitus) ([place]) 09/02/2014  . Pain in limb 12/27/2013  . Aftercare following surgery of the circulatory system, [place] 06/28/2013  . Atherosclerosis of native arteries of the extremities with intermittent claudication 06/28/2013  . Peripheral vascular disease ([place]) 12/28/2012  . PAD (peripheral artery disease) ([place]) 12/23/2011   PCP:  Sinda Du, MD Pharmacy:   [facility], [place] - [address] AT [address] [place] [address] Phone: [phone] Fax: [phone]     Social Determinants of Health (SDOH) Interventions    Readmission Risk Interventions No flowsheet data found.

## 2019-07-08 NOTE — Progress Notes (Addendum)
    Subjective  -   Not having pain at his stump    Physical Exam:  AKA healing nicely        Assessment/Plan:    Will add Levemir 5 units BID per diabetes management Appreciate cardiology assistance with arythmia Anticipate home tomorrow.  I spoke with grandaughter this am.  She can have him home tomorrow.  She is requesting a ramp for home for 6 steps.  I will speak with social work  Theme park manager Refael Fulop 07/08/2019 9:28 AM --  Vitals:   07/08/19 0000 07/08/19 0400  BP: 120/78 119/62  Pulse: 92 99  Resp: 16 14  Temp:  98.1 F (36.7 C)  SpO2: 100% 100%    Intake/Output Summary (Last 24 hours) at 07/08/2019 0928 Last data filed at 07/08/2019 0622 Gross per 24 hour  Intake --  Output 2496 ml  Net -2496 ml     Laboratory CBC    Component Value Date/Time   WBC 9.1 07/04/2019 0334   HGB 9.9 (L) 07/04/2019 0334   HCT 30.5 (L) 07/04/2019 0334   PLT 410 (H) 07/04/2019 0334    BMET    Component Value Date/Time   NA 136 07/07/2019 0307   NA 138 11/03/2017 0000   K 3.9 07/07/2019 0307   CL 103 07/07/2019 0307   CO2 26 07/07/2019 0307   GLUCOSE 258 (H) 07/07/2019 0307   BUN 17 07/07/2019 0307   BUN 23 (A) 11/03/2017 0000   CREATININE 1.13 07/07/2019 0307   CALCIUM 8.6 (L) 07/07/2019 0307   GFRNONAA 58 (L) 07/07/2019 0307   GFRAA >60 07/07/2019 0307    COAG Lab Results  Component Value Date   INR 1.1 06/16/2019   INR 1.06 12/28/2009   No results found for: PTT  Antibiotics Anti-infectives (From admission, onward)   Start     Dose/Rate Route Frequency Ordered Stop   07/02/19 2000  ceFAZolin (ANCEF) IVPB 2g/100 mL premix     2 g 200 mL/hr over 30 Minutes Intravenous Every 8 hours 07/02/19 1850 07/03/19 0551   07/02/19 0800  cefUROXime (ZINACEF) 1.5 g in sodium chloride 0.9 % 100 mL IVPB     1.5 g 200 mL/hr over 30 Minutes Intravenous On call to O.R. 07/02/19 0754 07/02/19 1308   06/30/19 0000  ceFAZolin (ANCEF) IVPB 1 g/50 mL premix  Status:   Discontinued    Note to Pharmacy: Send with pt to OR   1 g 100 mL/hr over 30 Minutes Intravenous On call 06/29/19 1531 06/29/19 1755   06/30/19 0000  ceFAZolin (ANCEF) IVPB 2g/100 mL premix    Note to Pharmacy: Send with pt to OR   2 g 200 mL/hr over 30 Minutes Intravenous On call 06/29/19 1755 06/30/19 1257   06/26/19 1400  ceFAZolin (ANCEF) IVPB 2g/100 mL premix     2 g 200 mL/hr over 30 Minutes Intravenous Every 8 hours 06/26/19 0855 06/26/19 2132   06/25/19 2000  ceFAZolin (ANCEF) IVPB 2g/100 mL premix  Status:  Discontinued     2 g 200 mL/hr over 30 Minutes Intravenous Every 8 hours 06/25/19 1851 06/26/19 0849       V. Charlena Cross, M.D., [facility] [place] Office: [phone] Pager:  [phone]

## 2019-07-08 NOTE — Progress Notes (Addendum)
   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  A walker will not resolve  issue with performing activities of daily living. A wheelchair will allow patient to safely perform daily activities. Patient is not able to propel themselves in the home using a standard weight wheelchair due to arm weakness, endurance and general weakness. Patient can self propel in the lightweight wheelchair. Length of need Lifetime. Accessories: elevating leg rests (ELRs), wheel locks, extensions and anti-tippers.  07/08/19 1143

## 2019-07-08 NOTE — Progress Notes (Addendum)
The patient has been seen in conjunction with Nada Boozer, NP. All aspects of care have been considered and discussed. The patient has been personally interviewed, examined, and all clinical data has been reviewed.   Very reasonable rate control on diltiazem and Toprol extended release preparations.  I am surprised by the intensity of therapy needed for rate control.  Recent TSH is normal.  Anticipate that diltiazem dose will need to be decreased or discontinued in the future.  We will need for cardiology follow-up with Dr. Wyline Mood in 2 to 4 weeks with an EKG.    Progress Note  Patient Name: Warren Mcdonald Date of Encounter: 07/08/2019  Primary Cardiologist: Dina Rich, MD   Subjective   No chest pain and no SOB, no awareness of palpatations  Inpatient Medications    Scheduled Meds: . apixaban  5 mg Oral BID  . atorvastatin  20 mg Oral q1800  . diltiazem  180 mg Oral Daily  . docusate sodium  100 mg Oral Daily  . insulin aspart  0-15 Units Subcutaneous TID WC  . insulin detemir  5 Units Subcutaneous BID  . loratadine  10 mg Oral Daily  . metoprolol succinate  100 mg Oral Daily  . pantoprazole  40 mg Oral Daily  . potassium chloride  40 mEq Oral Daily   Continuous Infusions:  PRN Meds: acetaminophen **OR** acetaminophen, alum & mag hydroxide-simeth, bisacodyl, guaiFENesin-dextromethorphan, hydrALAZINE, labetalol, morphine injection, ondansetron, oxyCODONE-acetaminophen, phenol, polyethylene glycol   Vital Signs    Vitals:   07/07/19 2055 07/08/19 0000 07/08/19 0400 07/08/19 1010  BP:  120/78 119/62 111/77  Pulse:  92 99 98  Resp:  16 14 20   Temp: 98.6 F (37 C)  98.1 F (36.7 C)   TempSrc: Oral  Oral   SpO2: 100% 100% 100% 100%  Weight:   88.6 kg   Height:        Intake/Output Summary (Last 24 hours) at 07/08/2019 1149 Last data filed at 07/08/2019 1001 Gross per 24 hour  Intake 120 ml  Output 2496 ml  Net -2376 ml   Last 3 Weights 07/08/2019  07/07/2019 07/06/2019  Weight (lbs) 195 lb 5.2 oz 200 lb 2.8 oz 203 lb 11.3 oz  Weight (kg) 88.6 kg 90.8 kg 92.4 kg      Telemetry    A fib with PVCs occ. occ couplets HR 89 to 105  - Personally Reviewed  ECG    No new - Personally Reviewed  Physical Exam   GEN: No acute distress.   Neck: No JVD Cardiac: irreg irreg, no murmurs, rubs, or gallops.  Respiratory: Clear to auscultation bilaterally. GI: Soft, nontender, non-distended  MS: No edema; Lt AKA Neuro:  Nonfocal  Psych: Normal affect   Labs    High Sensitivity Troponin:   Recent Labs  Lab 07/02/19 1807  TROPONINIHS 8      Chemistry Recent Labs  Lab 07/05/19 1047 07/06/19 0229 07/07/19 0307  NA 137 137 136  K 3.6 3.9 3.9  CL 104 101 103  CO2 21* 25 26  GLUCOSE 150* 242* 258*  BUN 20 19 17   CREATININE 1.18 1.15 1.13  CALCIUM 8.7* 8.7* 8.6*  GFRNONAA 55* 57* 58*  GFRAA >60 >60 >60  ANIONGAP 12 11 7      Hematology Recent Labs  Lab 07/02/19 1807 07/03/19 0256 07/04/19 0334  WBC 8.9 11.2* 9.1  RBC 3.45* 3.38* 3.38*  HGB 10.0* 10.0* 9.9*  HCT 31.6* 30.5* 30.5*  MCV 91.6  90.2 90.2  MCH 29.0 29.6 29.3  MCHC 31.6 32.8 32.5  RDW 13.7 13.4 13.6  PLT 374 383 410*    BNPNo results for input(s): BNP, PROBNP in the last 168 hours.   DDimer No results for input(s): DDIMER in the last 168 hours.   Radiology    No results found.  Cardiac Studies   2D echo 07/03/2019 IMPRESSIONS    1. Left ventricular ejection fraction, by visual estimation, is 40 to  45%. The left ventricle has moderately decreased function. There is mildly  increased left ventricular hypertrophy.  2. Left ventricular diastolic function could not be evaluated.  3. Mildly dilated left ventricular internal cavity size.  4. The left ventricle demonstrates global hypokinesis.  5. Global right ventricle has mildly reduced systolic function.The right  ventricular size is normal. No increase in right ventricular wall  thickness.   6. Left atrial size was moderately dilated.  7. Right atrial size was normal.  8. The mitral valve is abnormal. Mild to moderate mitral valve  regurgitation.  9. The tricuspid valve is grossly normal.  10. The tricuspid valve is grossly normal. Tricuspid valve regurgitation  is moderate.  11. The aortic valve is tricuspid. Aortic valve regurgitation is not  visualized. Mild aortic valve sclerosis without stenosis.  12. The pulmonic valve was grossly normal. Pulmonic valve regurgitation is  not visualized.    Patient Profile     86 y.o. male with a hx of PAF with CHADS2VASC score of 5 on chronic anticoagulation, chronic diastolic CHF and HTN. He has an extensive hx of PAD s/p left above-knee popliteal to posterior tibial artery bypass graft in 2011 and angioplasty of occluded right tibioperoneal trunk and posterior tibial artery in 2010 followed by Vascular, DM2, HLD. He has recently had several hospital admissions for vascular complications. Developed afib with RVR post op   Assessment & Plan    1. Atrial fibrillation with RVR - plan is rate control - BP limits med titration - had been off Eliquis, restarted - on Cardizem CD 180 mg qd and * metoprolol XL of 100 mg  - EF 40-45% on echo (prev nl) - TSH ok  2. HTN - off amlodipine, on Cardizem - SBP 123 to 128 last 24 hr - on metoprolol XL and tartrate. also  3. Chronic diastolic CHF - no volume overload - wt stable, trending down Pk 98.4 Kg now 90.8 Kg and neg 1635 cc - follow, not on diuretics  4. 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 Hypokalemia - not on diuretics - supplemented and improved - need to follow, now on 40 meq qd  7. Ventricular tachycardia - has had 3 bt runs and PVCs/pairs, decreased by rx--still with PVCs occ  - on toprol XL 100 mg - Keep K+ >4.0 and Mg >2.0  - no symptoms, no hx syncope  8. Mild LV dysfunction - possibly tachy-mediated, no hx inschemic sx - hx PAD,  high likelihood of CAD no ischemic work up this admit - on BB, Eliquis -LDL 92, HDL 30  -  d/c'd ASA because of Eliquis  9. HLD on lipitor 20 with LDL of 92    He will follow up in 2 weeks with Dr. Harl Bowie in [place].      For questions or updates, please contact [place] Please consult [email or web address] for contact info under        Signed, Cecilie Kicks, NP  07/08/2019, 11:49 AM

## 2019-07-08 NOTE — Progress Notes (Signed)
Physical Therapy Treatment Patient Details Name: Warren Mcdonald MRN: [id number] DOB: 1930-02-16 Today's Date: 07/08/2019    History of Present Illness Pt is an 86 y.o. male with recent L 4th toe amputation due to gangrene (06/16/19), pt admitted 06/25/19 with non-healing amputation site. S/p L 3-5th transmet amputations 1/30. S/p repeat L foot I&D 2/3. S/p L AKA 2/5. PMH includes PVD, DM2, HTN, PAF, CHF.   PT Comments    Pt preparing for d/c home. Declining transfer OOB, but agreeable to practice transfer set-up with slide board while seated EOB. Pt able to perform bed mobility and scooting with intermittent minA. Reports having 24/7 assist at home, including multiple daughters who are nurses. Pt will benefit from DME listed below and HHPT/OT services (declining CIR and SNF). If to remain admitted, will continue to follow acutely.   Follow Up Recommendations  CIR;Supervision/Assistance - 24 hour(pt declined, will need HHPT/OT)     Equipment Recommendations  Wheelchair; Wheelchair cushion; 3in1 (PT); 30" slide transfer board   Recommendations for Other Services       Precautions / Restrictions Precautions Precautions: Fall    Mobility  Bed Mobility Overal bed mobility: Needs Assistance Bed Mobility: Supine to Sit;Sit to Supine     Supine to sit: Min assist;HOB elevated Sit to supine: Min assist   General bed mobility comments: Increased time and effort, did not require cues for sequencing, minA for trunk elevation  Transfers Overall transfer level: Needs assistance   Transfers: Lateral/Scoot Transfers          Lateral/Scoot Transfers: Min guard General transfer comment: Pt declined transfer to recliner, declined additional practice with slide board. Able to perform lateral scoot towards HOB very well with BUE/RLE support, able to completely offload buttocks with bed slightly elevated, min guard and cues for technique  Ambulation/Gait                 Stairs              Wheelchair Mobility    Modified Rankin (Stroke Patients Only)       Balance Overall balance assessment: Needs assistance   Sitting balance-Leahy Scale: Fair Sitting balance - Comments: Able to maintain prolonged static sitting without assist; able to lean onto L elbow to have slide board placed by PT under R hip to simulate transfer set-up                                    Cognition Arousal/Alertness: Awake/alert Behavior During Therapy: [facility] for tasks assessed/performed Overall Cognitive Status: No family/caregiver present to determine baseline cognitive functioning                                 General Comments: WFL for basic tasks, although noted deficits with higher level processing. HOH and needs repetition of cues. Poor short-term memory      Exercises      General Comments General comments (skin integrity, edema, etc.): Pt preparing for d/c home, requesting BSC, w/c and slide transfer board; spoke with CM      Pertinent Vitals/Pain Pain Assessment: Faces Faces Pain Scale: Hurts even more Pain Location: L AKA  Pain Descriptors / Indicators: Grimacing;Guarding;Sore Pain Intervention(s): Monitored during session;Repositioned;Limited activity within patient's tolerance    Home Living  Prior Function            PT Goals (current goals can now be found in the care plan section) Progress towards PT goals: Progressing toward goals    Frequency    Min 3X/week      PT Plan Current plan remains appropriate;Equipment recommendations need to be updated    Co-evaluation              AM-PAC PT "6 Clicks" Mobility   Outcome Measure  Help needed turning from your back to your side while in a flat bed without using bedrails?: A Little Help needed moving from lying on your back to sitting on the side of a flat bed without using bedrails?: A Little Help needed moving to and from a bed  to a chair (including a wheelchair)?: A Lot Help needed standing up from a chair using your arms (e.g., wheelchair or bedside chair)?: A Lot Help needed to walk in hospital room?: Total Help needed climbing 3-5 steps with a railing? : Total 6 Click Score: 12    End of Session   Activity Tolerance: Patient tolerated treatment well;Patient limited by fatigue Patient left: in bed;with call bell/phone within reach;with bed alarm set Nurse Communication: Mobility status PT Visit Diagnosis: Other abnormalities of gait and mobility (R26.89);Muscle weakness (generalized) (M62.81);Difficulty in walking, not elsewhere classified (R26.2);Pain Pain - Right/Left: Left Pain - part of body: Leg     Time: 4765-4650 PT Time Calculation (min) (ACUTE ONLY): 22 min  Charges:  $Therapeutic Activity: 8-22 mins                    Mabeline Caras, PT, DPT Acute Rehabilitation Services  Pager [phone] Office [phone]  Derry Lory 07/08/2019, 4:04 PM

## 2019-07-09 LAB — GLUCOSE, CAPILLARY
Glucose-Capillary: 108 mg/dL — ABNORMAL HIGH (ref 70–99)
Glucose-Capillary: 188 mg/dL — ABNORMAL HIGH (ref 70–99)

## 2019-07-09 MED ORDER — METOPROLOL SUCCINATE ER 100 MG PO TB24: 100.0000 mg | ORAL_TABLET | Freq: Every day | ORAL | 1 refills | Status: DC

## 2019-07-09 MED ORDER — ATORVASTATIN CALCIUM 20 MG PO TABS: 20.0000 mg | ORAL_TABLET | Freq: Every day | ORAL | 3 refills | Status: DC

## 2019-07-09 MED ORDER — DILTIAZEM HCL ER COATED BEADS 180 MG PO CP24: 180.0000 mg | ORAL_CAPSULE | Freq: Every day | ORAL | 0 refills | Status: DC

## 2019-07-09 MED ORDER — HYDROCODONE-ACETAMINOPHEN 5-325 MG PO TABS: 1.0000 | ORAL_TABLET | ORAL | 0 refills | Status: DC | PRN

## 2019-07-09 NOTE — Progress Notes (Signed)
  Progress Note    07/09/2019 8:17 AM 7 Days Post-Op  Subjective:  No stump pain. Nothing new overnight   Vitals:   07/08/19 2001 07/09/19 0539  BP: 108/73 123/80  Pulse: 70 71  Resp: 20 18  Temp: 98.1 F (36.7 C) 97.7 F (36.5 C)  SpO2: 95% 96%   Physical Exam: General: Well nourished, appears comfortable Lungs:  Non labored Incisions:  Left above knee amputation intact, clean and dry. No tenderness. No fluid collections, no erythema or swelling Extremities: 2+ femoral pulse Abdomen: distended, non tender Neurologic: alert and oriented  CBC    Component Value Date/Time   WBC 9.1 07/04/2019 0334   RBC 3.38 (L) 07/04/2019 0334   HGB 9.9 (L) 07/04/2019 0334   HCT 30.5 (L) 07/04/2019 0334   PLT 410 (H) 07/04/2019 0334   MCV 90.2 07/04/2019 0334   MCH 29.3 07/04/2019 0334   MCHC 32.5 07/04/2019 0334   RDW 13.6 07/04/2019 0334   LYMPHSABS 1.2 09/10/2014 0433   MONOABS 0.8 09/10/2014 0433   EOSABS 0.2 09/10/2014 0433   BASOSABS 0.1 09/10/2014 0433    BMET    Component Value Date/Time   NA 136 07/07/2019 0307   NA 138 11/03/2017 0000   K 3.9 07/07/2019 0307   CL 103 07/07/2019 0307   CO2 26 07/07/2019 0307   GLUCOSE 258 (H) 07/07/2019 0307   BUN 17 07/07/2019 0307   BUN 23 (A) 11/03/2017 0000   CREATININE 1.13 07/07/2019 0307   CALCIUM 8.6 (L) 07/07/2019 0307   GFRNONAA 58 (L) 07/07/2019 0307   GFRAA >60 07/07/2019 0307    INR    Component Value Date/Time   INR 1.1 06/16/2019 0857     Intake/Output Summary (Last 24 hours) at 07/09/2019 0817 Last data filed at 07/09/2019 0005 Gross per 24 hour  Intake 240 ml  Output 1350 ml  Net -1110 ml     Assessment/Plan:  86 y.o. male is s/p left above knee amputation 7 Days Post-Op. Doing well post op Cardiology feels he is adequately rate controlled at this point on Diltiazem and Toprol. He will follow up with Cardiology in 2-4 weeks. Improved glucose with addition of Levemir. Orders have been placed for home  PT/RN, wheel chair, slide board, as well as ramp for home. CM still awaiting discussion with granddaughter this morning but otherwise anticipating discharge home later today  DVT prophylaxis:  Apixiban   Graceann Congress, PA-C [facility] [phone] 07/09/2019 8:17 AM

## 2019-07-09 NOTE — Progress Notes (Signed)
   Rates reasonably well controlled on telemetry. Brief episode of Afib with RVR with rates in the 140s this morning which improved with morning medications.   [facility] will sign off.   Medication Recommendations:  Continue current metoprolol, diltiazem, and eliquis for Afib management Other recommendations (labs, testing, etc):  None Follow up as an outpatient:  Scheduled to see Warren Reedy, PA-C 07/26/19 at 12:30pm - AVS updated.

## 2019-07-09 NOTE — TOC Transition Note (Signed)
Transition of Care [facility]) - CM/SW Discharge Note Donn Pierini RN, BSN Transitions of Care Unit 4E- RN Case Manager [phone]   Patient Details  Name: Warren Mcdonald MRN: [id number] Date of Birth: 03/28/1930  Transition of Care [facility]) CM/SW Contact:  Darrold Span, RN Phone Number: 07/09/2019, 4:14 PM   Clinical Narrative:    Pt for transition home today, DME has been delivered to room 3n1, w/c and slide board for pt to take home- spoke with granddaughter [name] regarding ramp- they can rent one from [employer] which she has elected to do- she states she has found someone that will build one at the patient's home and they will use rental at the sister's home- they will plan to pick ramp up at [employer] store when they come to pick pt up. Have spoken with Ian Malkin at [employer] and they will have ramp on hold for pt at the [facility]- 5 ft ramp $50/mo. - family to transport home- have contacted Tiffany with [facility] and provided address that pt will be at temporarily- [address], [place] [address]   Final next level of care: Home w Home Health Services Barriers to Discharge: No Barriers Identified   Patient Goals and CMS Choice Patient states their goals for this hospitalization and ongoing recovery are:: return home CMS Medicare.gov Compare Post Acute Care list provided to:: Patient Choice offered to / list presented to : Patient  Discharge Placement                 Home with [facility]      Discharge Plan and Services   Discharge Planning Services: CM Consult Post Acute Care Choice: Home Health, Resumption of Svcs/PTA Provider, Durable Medical Equipment          DME Arranged: 3-N-1, Wheelchair manual, Other see comment DME Agency: [employer] Date DME Agency Contacted: 07/08/19 Time DME Agency Contacted: 1200 Representative spoke with at DME Agency: Ian Malkin HH Arranged: RN, PT HH Agency: [facility] Date [facility] Agency Contacted: 07/08/19 Time HH Agency Contacted:  1100 Representative spoke with at [facility] Agency: Tiffany  Social Determinants of Health (SDOH) Interventions     Readmission Risk Interventions No flowsheet data found.

## 2019-07-09 NOTE — Discharge Summary (Signed)
[facility] Discharge Summary   Patient ID:  Warren Mcdonald MRN: [id number] DOB/AGE: 1930-04-04 86 y.o.  Admit date: 06/25/2019 Discharge date: 07/09/2019 Date of Surgery: 07/02/2019 Surgeon: Moishe Spice): Early, Kristen Loader, MD  Admission Diagnosis: Nonhealing surgical wound [T81.89XA]  Discharge Diagnoses:  Nonhealing surgical wound [T81.89XA]  Secondary Diagnoses: Past Medical History:  Diagnosis Date  . Abdominal distension (gaseous)   . Anemia, unspecified   . Chronic diastolic (congestive) heart failure (HCC)   . Essential (primary) hypertension   . Hyperlipidemia   . PAF (paroxysmal atrial fibrillation) (HCC)   . Peripheral vascular disease (HCC) 2011   Left above-knee popliteal to posterior tibial artery bypass   . PUD (peptic ulcer disease)   . Type 2 diabetes mellitus with diabetic peripheral angiopathy without gangrene (HCC)     Procedure(s): left AMPUTATION ABOVE KNEE  Discharged Condition: good  HPI: This is a 86 y.o. male with past medical history significant for type 2 diabetes mellitus, hyperlipidemia, hypertension, and PAD. He has hx of left above knee popliteal to posterior tibial artery bypass graft placed in 2011. He was found to have stenosis in this graft as well as 4th toe wound. He underwent balloon angioplasty of left PTA and bypass graft stenosis by Dr. Randie Heinz on 05/24/2019 followed by left fourth toe amputation for gangrene by Dr. Myra Gianotti 06/16/19. Home wound care nurse suggested patient should present to the emergency department due to nonhealing left fourth toe amputation site. He presented to the ED on 06/25/19. He was subsequently admitted and started on IV antibiotics and wound debridement and further amputation was planned  Hospital Course:  Warren Mcdonald is a 86 y.o. male is S/P open transmetatarsal amputation left third, fourth, and fifth toes due to necrosis of the plantar aspect of the left foot extending all the way to the  proximal fourth metatarsal with involvement of the 3rd and 5th metatarsals 06/26/19. By post operative day 2 the left foot wound did not appear to be healing well and was malodorous. He subsequently underwent a sharp excisional debridement of open left foot TMA including bone, tendon, muscle and skin on 06/30/19 by Dr. Chestine Spore. Following this procedure again his left foot wound following toe amputations and debridement continued to show signs of persistent necrosis and ischemia. He subsequently underwent a above knee amputation as the family and patient wanted to have as few procedures as necessary and it was not guaranteed that he would heal a below knee amputation. Dr. Arbie Cookey performed the above knee amputation on 07/02/19. Immediately post op the patient in the PACU was noted to be in Atrial fibrillation. He received 2 doses of verapamil. Post op day 1 his heart rate was in the 120's-170s poorly controled. He also required replenishment of magnesium and potassium. His Eliquis was restarted and cardiology was consulted for assistance with his atrial fibrillation. Per Cardiology recommendations he was able to be relatively rate controlled with Diltiazem and Toprol extended release. His amlodipine and Aspirin were discontinued. He will continue his Apixiban. He will follow up with Cardiology in 2-4 weeks. From a surgical standpoint his left above knee amputation remained clean dry and intact post op with very minimal pain. He will have home PT/RN, wheel chair, slide board, as well as ramp for home. He has adequate support from family and will be discharged home today with follow up in 3-4 weeks   Procedure(s): left AMPUTATION ABOVE KNEE Extubated: POD # 7 Post-op wounds clean, dry, intact or healing well  Pt. Ambulating with assistance, voiding and taking PO diet without difficulty. Pt pain controlled with PO pain meds. Labs as below Complications: A fib with PVC's  Consults:  Treatment Team:  Serafina Mitchell,  MD   Significant Diagnostic Studies: CBC Lab Results  Component Value Date   WBC 9.1 07/04/2019   HGB 9.9 (L) 07/04/2019   HCT 30.5 (L) 07/04/2019   MCV 90.2 07/04/2019   PLT 410 (H) 07/04/2019    BMET    Component Value Date/Time   NA 136 07/07/2019 0307   NA 138 11/03/2017 0000   K 3.9 07/07/2019 0307   CL 103 07/07/2019 0307   CO2 26 07/07/2019 0307   GLUCOSE 258 (H) 07/07/2019 0307   BUN 17 07/07/2019 0307   BUN 23 (A) 11/03/2017 0000   CREATININE 1.13 07/07/2019 0307   CALCIUM 8.6 (L) 07/07/2019 0307   GFRNONAA 58 (L) 07/07/2019 0307   GFRAA >60 07/07/2019 0307   COAG Lab Results  Component Value Date   INR 1.1 06/16/2019   INR 1.06 12/28/2009     Disposition:  Discharge to :Home with [place] and HHPT Discharge Instructions    Discharge patient   Complete by: As directed    Once CM has discussed DME needs with patients granddaughter   Discharge disposition: 01-Home or Self Care   Discharge patient date: 07/09/2019     Allergies as of 07/09/2019   No Known Allergies     Medication List    STOP taking these medications   amLODipine 10 MG tablet Commonly known as: NORVASC   aspirin EC 81 MG tablet   metoprolol tartrate 25 MG tablet Commonly known as: LOPRESSOR     TAKE these medications   apixaban 5 MG Tabs tablet Commonly known as: Eliquis Take 1 tablet (5 mg total) by mouth 2 (two) times daily.   atorvastatin 20 MG tablet Commonly known as: LIPITOR Take 1 tablet (20 mg total) by mouth daily at 6 PM.   bisacodyl 5 MG EC tablet Commonly known as: DULCOLAX Take 5 mg by mouth daily as needed for moderate constipation.   cetirizine 10 MG tablet Commonly known as: ZYRTEC Take 10 mg by mouth at bedtime.   diltiazem 180 MG 24 hr capsule Commonly known as: CARDIZEM CD Take 1 capsule (180 mg total) by mouth daily.   HYDROcodone-acetaminophen 5-325 MG tablet Commonly known as: NORCO/VICODIN Take 1 tablet by mouth every 4 (four) hours as needed  for moderate pain.   latanoprost 0.005 % ophthalmic solution Commonly known as: XALATAN 1 drop at bedtime.   lubiprostone 24 MCG capsule Commonly known as: AMITIZA Take 24 mcg by mouth 2 (two) times daily with a meal.   metFORMIN 500 MG tablet Commonly known as: GLUCOPHAGE Take 500 mg by mouth 2 (two) times daily with a meal.   metoprolol succinate 100 MG 24 hr tablet Commonly known as: TOPROL-XL Take 1 tablet (100 mg total) by mouth daily. Take with or immediately following a meal.   traMADol 50 MG tablet Commonly known as: ULTRAM Take 50 mg by mouth every 6 (six) hours as needed.            Durable Medical Equipment  (From admission, onward)         Start     Ordered   07/08/19 1541  For home use only DME Other see comment  Once    Comments: 30 in. Transfer slide board  Question:  Length of Need  Answer:  Lifetime   07/08/19 1541   07/08/19 1144  For home use only DME 3 n 1  Once     07/08/19 1143   07/08/19 1142  For home use only DME lightweight manual wheelchair with seat cushion  Once    Comments: Patient suffers from amputation of the left LE which impairs their ability to perform daily activities like bathing, dressing and grooming in the home.  A walker will not resolve  issue with performing activities of daily living. A wheelchair will allow patient to safely perform daily activities. Patient is not able to propel themselves in the home using a standard weight wheelchair due to arm weakness, endurance and general weakness. Patient can self propel in the lightweight wheelchair. Length of need Lifetime. Accessories: elevating leg rests (ELRs), wheel locks, extensions and anti-tippers.   07/08/19 1143         Verbal and written Discharge instructions given to the patient. Wound care per Discharge AVS Follow-up Information    Nada Libman, MD In 4 weeks.   Specialties: Vascular Surgery, Cardiology Why: Office will call you to arrange your appt  (sent) Contact information: [address] [place] [address] [phone]        Antoine Poche, MD Follow up on 07/26/2019.   Specialty: Cardiology Why: at 12:30 PM with his PA Jacolyn Reedy Contact information: [address] [place] [address] [phone]           Signed: Graceann Congress 07/09/2019, 3:52 PM

## 2019-07-09 NOTE — Progress Notes (Signed)
Physical Therapy Treatment Patient Details Name: Warren Mcdonald MRN: [id number] DOB: Oct 10, 1929 Today's Date: 07/09/2019    History of Present Illness Pt is an 86 y.o. male with recent L 4th toe amputation due to gangrene (06/16/19), pt admitted 06/25/19 with non-healing amputation site. S/p L 3-5th transmet amputations 1/30. S/p repeat L foot I&D 2/3. S/p L AKA 2/5. PMH includes PVD, DM2, HTN, PAF, CHF.    PT Comments    Patient progressing well towards PT goals. Requires min A for trunk support once upright for balance but able to steady self with RLE on floor. Mobility limited due to elevated HR up to 146 bpm and elevated BP 144/118 sitting EOB as well as BM. Practiced laterally scooting along side bed with Min guard assist with multiple scoots. Pt eager to return home today. Declined transfer to chair. Reviewed precautions and there ex. Will continue to follow.   Follow Up Recommendations  Home health PT;Supervision for mobility/OOB     Equipment Recommendations  Wheelchair (measurements PT);Wheelchair cushion (measurements PT);Other (comment);3in1 (PT)(slide board, drop arm BSC)    Recommendations for Other Services       Precautions / Restrictions Precautions Precautions: Fall Precaution Comments: watch HR Restrictions Weight Bearing Restrictions: Yes LLE Weight Bearing: Non weight bearing    Mobility  Bed Mobility Overal bed mobility: Needs Assistance Bed Mobility: Supine to Sit;Rolling Rolling: Supervision   Supine to sit: HOB elevated;Min assist Sit to supine: Supervision;HOB elevated   General bed mobility comments: Increased effort, use of rail, no assist needed, Min A for trunk once upright. Rolling to right/left due to pericare.  Transfers Overall transfer level: Needs assistance Equipment used: None Transfers: Lateral/Scoot Transfers          Lateral/Scoot Transfers: Min guard General transfer comment: Pt declined transfer to recliner due to it not  being comfortable;  Able to perform lateral scoot towards [facility] very well with BUE/RLE support, able to completely offload buttocks with min guard and cues for technique.  Ambulation/Gait                 Stairs             Wheelchair Mobility    Modified Rankin (Stroke Patients Only)       Balance Overall balance assessment: Needs assistance Sitting-balance support: Feet supported;Bilateral upper extremity supported Sitting balance-Leahy Scale: Fair Sitting balance - Comments: Min guard-Supervision for safety                                    Cognition Arousal/Alertness: Awake/alert Behavior During Therapy: WFL for tasks assessed/performed Overall Cognitive Status: No family/caregiver present to determine baseline cognitive functioning                                 General Comments: WFL for basic tasks, although noted deficits with higher level processing. HOH and needs repetition of cues.      Exercises Amputee Exercises Hip Extension: Left;5 reps;Supine;Strengthening Hip Flexion/Marching: Left;5 reps;Supine;Strengthening    General Comments General comments (skin integrity, edema, etc.): HR up to 146 bpm A-fib. BP 144/118.      Pertinent Vitals/Pain Pain Assessment: No/denies pain    Home Living                      Prior Function  PT Goals (current goals can now be found in the care plan section) Progress towards PT goals: Progressing toward goals    Frequency    Min 3X/week      PT Plan Discharge plan needs to be updated    Co-evaluation              AM-PAC PT "6 Clicks" Mobility   Outcome Measure  Help needed turning from your back to your side while in a flat bed without using bedrails?: A Little Help needed moving from lying on your back to sitting on the side of a flat bed without using bedrails?: A Little Help needed moving to and from a bed to a chair (including a  wheelchair)?: A Little Help needed standing up from a chair using your arms (e.g., wheelchair or bedside chair)?: A Lot Help needed to walk in hospital room?: Total Help needed climbing 3-5 steps with a railing? : Total 6 Click Score: 13    End of Session   Activity Tolerance: Patient tolerated treatment well Patient left: in bed;with call bell/phone within reach;with bed alarm set Nurse Communication: Mobility status;Other (comment)(assist with pericare) PT Visit Diagnosis: Other abnormalities of gait and mobility (R26.89);Muscle weakness (generalized) (M62.81);Difficulty in walking, not elsewhere classified (R26.2)     Time: 0092-3300 PT Time Calculation (min) (ACUTE ONLY): 21 min  Charges:  $Therapeutic Activity: 8-22 mins                     Marisa Severin, PT, DPT Acute Rehabilitation Services Pager [phone] Office [phone]       Marguarite Arbour A Sabra Heck 07/09/2019, 9:49 AM

## 2019-07-12 DIAGNOSIS — Z4781 Encounter for orthopedic aftercare following surgical amputation: Secondary | ICD-10-CM | POA: Diagnosis not present

## 2019-07-12 DIAGNOSIS — I5032 Chronic diastolic (congestive) heart failure: Secondary | ICD-10-CM | POA: Diagnosis not present

## 2019-07-12 DIAGNOSIS — E1151 Type 2 diabetes mellitus with diabetic peripheral angiopathy without gangrene: Secondary | ICD-10-CM | POA: Diagnosis not present

## 2019-07-12 DIAGNOSIS — Z89422 Acquired absence of other left toe(s): Secondary | ICD-10-CM | POA: Diagnosis not present

## 2019-07-12 DIAGNOSIS — I70292 Other atherosclerosis of native arteries of extremities, left leg: Secondary | ICD-10-CM | POA: Diagnosis not present

## 2019-07-12 DIAGNOSIS — I110 Hypertensive heart disease with heart failure: Secondary | ICD-10-CM | POA: Diagnosis not present

## 2019-07-14 DIAGNOSIS — Z4781 Encounter for orthopedic aftercare following surgical amputation: Secondary | ICD-10-CM | POA: Diagnosis not present

## 2019-07-14 DIAGNOSIS — I110 Hypertensive heart disease with heart failure: Secondary | ICD-10-CM | POA: Diagnosis not present

## 2019-07-14 DIAGNOSIS — I70292 Other atherosclerosis of native arteries of extremities, left leg: Secondary | ICD-10-CM | POA: Diagnosis not present

## 2019-07-14 DIAGNOSIS — I5032 Chronic diastolic (congestive) heart failure: Secondary | ICD-10-CM | POA: Diagnosis not present

## 2019-07-14 DIAGNOSIS — E1151 Type 2 diabetes mellitus with diabetic peripheral angiopathy without gangrene: Secondary | ICD-10-CM | POA: Diagnosis not present

## 2019-07-14 DIAGNOSIS — Z89422 Acquired absence of other left toe(s): Secondary | ICD-10-CM | POA: Diagnosis not present

## 2019-07-14 NOTE — Progress Notes (Signed)
Cardiology Office Note    Date:  07/26/2019   ID:  Warren Mcdonald, DOB April 27, 1930, MRN [id number]  PCP:  Patient, No Pcp Per  Cardiologist: Dina Rich, MD EPS: None  No chief complaint on file.   History of Present Illness:  Warren Mcdonald is a 86 y.o. male with a hx of PAF with CHADS2VASC score of 5 on chronic anticoagulation, chronic diastolic CHF and HTN.  He has an extensive hx of PAD s/p left above-knee popliteal to posterior tibial artery bypass graft in 2011 and angioplasty of occluded right tibioperoneal trunk and posterior tibial artery in 2010 followed by Vascular, DM2, HLD. He has recently had several hospital admissions for vascular complications.    Developed afib with RVR post op controlled with diltiazem 180 mg daily metoprolol 100 mg daily.  2D echo LVEF 40 to 45% which was normal on prior echo.  LV dysfunction possibly tachycardia mediated no ischemic symptoms but high likelihood of CAD with history of PAD.  Aspirin stopped because of Eliquis .did have 3 beat run of wide-complex tachycardia improved with beta-blocker and potassium replacement.  Patient comes in today accompanied by his granddaughter who is a Engineer, civil (consulting).  He is living with his daughter and is doing much better.  Denies any palpitations dyspnea or edema.  Does not use extra salt.  Has never felt any palpitations.     Past Medical History:  Diagnosis Date  . Abdominal distension (gaseous)   . Anemia, unspecified   . Chronic diastolic (congestive) heart failure (HCC)   . Essential (primary) hypertension   . Hyperlipidemia   . PAF (paroxysmal atrial fibrillation) (HCC)   . Peripheral vascular disease (HCC) 2011   Left above-knee popliteal to posterior tibial artery bypass   . PUD (peptic ulcer disease)   . Type 2 diabetes mellitus with diabetic peripheral angiopathy without gangrene [facility])     Past Surgical History:  Procedure Laterality Date  . ABDOMINAL AORTOGRAM N/A 05/24/2019   Procedure:  ABDOMINAL AORTOGRAM;  Surgeon: Maeola Harman, MD;  Location: [facility] INVASIVE CV LAB;  Service: Cardiovascular;  Laterality: N/A;  . AMPUTATION Left 06/16/2019   Procedure: AMPUTATION DIGIT FOURTH TOE LEFT FOOT;  Surgeon: Nada Libman, MD;  Location: [facility] OR;  Service: Vascular;  Laterality: Left;  . AMPUTATION Left 06/26/2019   Procedure: AMPUTATION two DIGITs, left toe;  Surgeon: Maeola Harman, MD;  Location: [facility] OR;  Service: Vascular;  Laterality: Left;  . AMPUTATION Left 07/02/2019   Procedure: left AMPUTATION ABOVE KNEE;  Surgeon: Larina Earthly, MD;  Location: [facility] OR;  Service: Vascular;  Laterality: Left;  . APPENDECTOMY    . CATARACT EXTRACTION W/ INTRAOCULAR LENS  IMPLANT, BILATERAL    . CHOLECYSTECTOMY N/A 09/08/2014   Procedure: CHOLECYSTECTOMY;  Surgeon: Franky Macho Md, MD;  Location: [facility] ORS;  Service: General;  Laterality: N/A;  . EYE SURGERY    . I & D EXTREMITY Left 06/30/2019   Procedure: sharp incisonal DEBRIDEMENT of left foot including bone, tendon, muscle, and skin;  Surgeon: Cephus Shelling, MD;  Location: [facility] OR;  Service: Vascular;  Laterality: Left;  . LOWER EXTREMITY ANGIOGRAPHY Left 05/24/2019   Procedure: Lower Extremity Angiography;  Surgeon: Maeola Harman, MD;  Location: [facility] INVASIVE CV LAB;  Service: Cardiovascular;  Laterality: Left;  . PERIPHERAL VASCULAR BALLOON ANGIOPLASTY Left 05/24/2019   Procedure: PERIPHERAL VASCULAR BALLOON ANGIOPLASTY;  Surgeon: Maeola Harman, MD;  Location: [facility] INVASIVE CV LAB;  Service: Cardiovascular;  Laterality:  Left;  tibial bypass  . PR VEIN BYPASS GRAFT,AORTO-FEM-POP     Left above knee popliteal to posterior tibial artery bypass graft    Current Medications: Current Meds  Medication Sig  . apixaban (ELIQUIS) 5 MG TABS tablet Take 1 tablet (5 mg total) by mouth 2 (two) times daily.  Marland Kitchen atorvastatin (LIPITOR) 20 MG tablet Take 1 tablet (20 mg total) by mouth daily at 6 PM.  . bisacodyl  (DULCOLAX) 5 MG EC tablet Take 5 mg by mouth daily as needed for moderate constipation.  . cetirizine (ZYRTEC) 10 MG tablet Take 10 mg by mouth at bedtime.  Marland Kitchen diltiazem (CARDIZEM CD) 180 MG 24 hr capsule Take 1 capsule (180 mg total) by mouth daily.  Marland Kitchen HYDROcodone-acetaminophen (NORCO/VICODIN) 5-325 MG tablet Take 1 tablet by mouth every 4 (four) hours as needed for moderate pain.  Marland Kitchen latanoprost (XALATAN) 0.005 % ophthalmic solution 1 drop at bedtime.  Marland Kitchen lubiprostone (AMITIZA) 24 MCG capsule Take 24 mcg by mouth 2 (two) times daily with a meal.  . metFORMIN (GLUCOPHAGE) 500 MG tablet Take 500 mg by mouth 2 (two) times daily with a meal.    . metoprolol succinate (TOPROL-XL) 100 MG 24 hr tablet Take 1 tablet (100 mg total) by mouth daily. Take with or immediately following a meal.  . traMADol (ULTRAM) 50 MG tablet Take 50 mg by mouth every 6 (six) hours as needed.     Allergies:   Patient has no known allergies.   Social History   Socioeconomic History  . Marital status: Married    Spouse name: Not on file  . Number of children: Not on file  . Years of education: Not on file  . Highest education level: Not on file  Occupational History  . Not on file  Tobacco Use  . Smoking status: Former Smoker    Types: Cigars    Quit date: 05/27/1988    Years since quitting: 31.1  . Smokeless tobacco: Former Neurosurgeon    Types: Chew    Quit date: 05/27/1988  Substance and Sexual Activity  . Alcohol use: No    Alcohol/week: 0.0 standard drinks  . Drug use: No  . Sexual activity: Not on file  Other Topics Concern  . Not on file  Social History Narrative  . Not on file   Social Determinants of Health   Financial Resource Strain:   . Difficulty of Paying Living Expenses: Not on file  Food Insecurity:   . Worried About Programme researcher, broadcasting/film/video in the Last Year: Not on file  . Ran Out of Food in the Last Year: Not on file  Transportation Needs:   . Lack of Transportation (Medical): Not on file  . Lack  of Transportation (Non-Medical): Not on file  Physical Activity:   . Days of Exercise per Week: Not on file  . Minutes of Exercise per Session: Not on file  Stress:   . Feeling of Stress : Not on file  Social Connections:   . Frequency of Communication with Friends and Family: Not on file  . Frequency of Social Gatherings with Friends and Family: Not on file  . Attends Religious Services: Not on file  . Active Member of Clubs or Organizations: Not on file  . Attends Banker Meetings: Not on file  . Marital Status: Not on file     Family History:  The patient's family history includes Cancer in his brother, daughter, and sister; Deep vein thrombosis in  his sister; Diabetes in his daughter, sister, and another family member; Heart attack in his father and mother; Heart disease in his father and mother; Hypertension in his mother; Prostate cancer in an other family member.   ROS:   Please see the history of present illness.    ROS All other systems reviewed and are negative.   PHYSICAL EXAM:   VS:  BP (!) 166/84   Pulse 85   Temp (!) 97 F (36.1 C)   Ht 6' (1.829 m)   SpO2 97%   BMI 26.70 kg/m   Physical Exam  GEN: Well nourished, well developed, in no acute distress  Neck: no JVD, carotid bruits, or masses Cardiac irregular irregular; no murmurs, rubs, or gallops  Respiratory:  clear to auscultation bilaterally, normal work of breathing GI: soft, nontender, nondistended, + BS Ext: without cyanosis, clubbing, or edema on the right, amputation on the left Neuro:  Alert and Oriented x 3 Psych: euthymic mood, full affect  Wt Readings from Last 3 Encounters:  07/09/19 196 lb 13.9 oz (89.3 kg)  06/16/19 215 lb (97.5 kg)  06/14/19 215 lb (97.5 kg)      Studies/Labs Reviewed:   EKG:  EKG is  ordered today.  The ekg ordered today demonstrates atrial fibrillation at 98 bpm with PVCs nonspecific ST-T wave changes, no acute change  Recent Labs: 07/04/2019: Hemoglobin  9.9; Platelets 410 07/05/2019: Magnesium 2.0; TSH 2.795 07/07/2019: BUN 17; Creatinine, Ser 1.13; Potassium 3.9; Sodium 136   Lipid Panel    Component Value Date/Time   CHOL 142 07/05/2019 1047   TRIG 100 07/05/2019 1047   HDL 30 (L) 07/05/2019 1047   CHOLHDL 4.7 07/05/2019 1047   VLDL 20 07/05/2019 1047   [place] 92 07/05/2019 1047    Additional studies/ records that were reviewed today include:  2D echo 07/03/2019 IMPRESSIONS     1. Left ventricular ejection fraction, by visual estimation, is 40 to  45%. The left ventricle has moderately decreased function. There is mildly  increased left ventricular hypertrophy.   2. Left ventricular diastolic function could not be evaluated.   3. Mildly dilated left ventricular internal cavity size.   4. The left ventricle demonstrates global hypokinesis.   5. Global right ventricle has mildly reduced systolic function.The right  ventricular size is normal. No increase in right ventricular wall  thickness.   6. Left atrial size was moderately dilated.   7. Right atrial size was normal.   8. The mitral valve is abnormal. Mild to moderate mitral valve  regurgitation.   9. The tricuspid valve is grossly normal.  10. The tricuspid valve is grossly normal. Tricuspid valve regurgitation  is moderate.  11. The aortic valve is tricuspid. Aortic valve regurgitation is not  visualized. Mild aortic valve sclerosis without stenosis.  12. The pulmonic valve was grossly normal. Pulmonic valve regurgitation is  not visualized.          ASSESSMENT:    1. Persistent atrial fibrillation ([place])   2. NICM (nonischemic cardiomyopathy) ([place])   3. Chronic diastolic CHF (congestive heart failure) ([place])   4. Essential hypertension   5. PAD (peripheral artery disease) ([place])   6. NSVT (nonsustained ventricular tachycardia) (HCC)      PLAN:  In order of problems listed above:  Atrial fibrillation with RVR post op treated with Cardizem CD 180 mg daily  and metoprolol XL 100 mg daily.  Also on Eliquis.  Amlodipine stopped.  No bleeding problems.  Heart rate  98 bpm today.  Patient does have home health nurse checked him regularly and his heart rate has been in the 80s every time.  We will continue current dose diltiazem and metoprolol.  Follow-up with Dr. Wyline Mood in 6 weeks.  Cardiomyopathy EF 40 to 45% could be tachycardia mediated but also could be ischemia because of history of PAD.  No work-up done in the hospital.  No evidence of CHF on exam.  Discussed 2 g sodium diet.  Chronic diastolic CHF compensated  Essential hypertension up a little today but has been controlled at 135/80 at home.  Verified with home health nurse.  PAD status post left AKA 07/02/2019 followed by VVS  NSVT improved with potassium and magnesium replacement and Toprol-XL rechecking labs today.    Medication Adjustments/Labs and Tests Ordered: Current medicines are reviewed at length with the patient today.  Concerns regarding medicines are outlined above.  Medication changes, Labs and Tests ordered today are listed in the Patient Instructions below. Patient Instructions  Medication Instructions: ,instcur   Labwork: BMEt,cbc   Procedures/Testing: None  Follow-Up: 6-8 weeks with Dr.Branch    Any Additional Special Instructions Will Be Listed Below (If Applicable).     If you need a refill on your cardiac medications before your next appointment, please call your pharmacy.      Signed, Jacolyn Reedy, PA-C  07/26/2019 1:01 PM    [facility] [address] [place], [place], [place]  [address] Phone: [phone]; Fax: [phone]

## 2019-07-16 DIAGNOSIS — I70292 Other atherosclerosis of native arteries of extremities, left leg: Secondary | ICD-10-CM | POA: Diagnosis not present

## 2019-07-16 DIAGNOSIS — I110 Hypertensive heart disease with heart failure: Secondary | ICD-10-CM | POA: Diagnosis not present

## 2019-07-16 DIAGNOSIS — E1151 Type 2 diabetes mellitus with diabetic peripheral angiopathy without gangrene: Secondary | ICD-10-CM | POA: Diagnosis not present

## 2019-07-16 DIAGNOSIS — Z4781 Encounter for orthopedic aftercare following surgical amputation: Secondary | ICD-10-CM | POA: Diagnosis not present

## 2019-07-16 DIAGNOSIS — Z89422 Acquired absence of other left toe(s): Secondary | ICD-10-CM | POA: Diagnosis not present

## 2019-07-16 DIAGNOSIS — I5032 Chronic diastolic (congestive) heart failure: Secondary | ICD-10-CM | POA: Diagnosis not present

## 2019-07-18 DIAGNOSIS — I5032 Chronic diastolic (congestive) heart failure: Secondary | ICD-10-CM | POA: Diagnosis not present

## 2019-07-18 DIAGNOSIS — Z89612 Acquired absence of left leg above knee: Secondary | ICD-10-CM | POA: Diagnosis not present

## 2019-07-18 DIAGNOSIS — M62562 Muscle wasting and atrophy, not elsewhere classified, left lower leg: Secondary | ICD-10-CM | POA: Diagnosis not present

## 2019-07-18 DIAGNOSIS — I70292 Other atherosclerosis of native arteries of extremities, left leg: Secondary | ICD-10-CM | POA: Diagnosis not present

## 2019-07-18 DIAGNOSIS — Z741 Need for assistance with personal care: Secondary | ICD-10-CM | POA: Diagnosis not present

## 2019-07-18 DIAGNOSIS — R262 Difficulty in walking, not elsewhere classified: Secondary | ICD-10-CM | POA: Diagnosis not present

## 2019-07-18 DIAGNOSIS — Z4781 Encounter for orthopedic aftercare following surgical amputation: Secondary | ICD-10-CM | POA: Diagnosis not present

## 2019-07-18 DIAGNOSIS — E1151 Type 2 diabetes mellitus with diabetic peripheral angiopathy without gangrene: Secondary | ICD-10-CM | POA: Diagnosis not present

## 2019-07-18 DIAGNOSIS — Z7984 Long term (current) use of oral hypoglycemic drugs: Secondary | ICD-10-CM | POA: Diagnosis not present

## 2019-07-18 DIAGNOSIS — I110 Hypertensive heart disease with heart failure: Secondary | ICD-10-CM | POA: Diagnosis not present

## 2019-07-18 DIAGNOSIS — I4891 Unspecified atrial fibrillation: Secondary | ICD-10-CM | POA: Diagnosis not present

## 2019-07-20 DIAGNOSIS — I110 Hypertensive heart disease with heart failure: Secondary | ICD-10-CM | POA: Diagnosis not present

## 2019-07-20 DIAGNOSIS — I5032 Chronic diastolic (congestive) heart failure: Secondary | ICD-10-CM | POA: Diagnosis not present

## 2019-07-20 DIAGNOSIS — I70292 Other atherosclerosis of native arteries of extremities, left leg: Secondary | ICD-10-CM | POA: Diagnosis not present

## 2019-07-20 DIAGNOSIS — E1151 Type 2 diabetes mellitus with diabetic peripheral angiopathy without gangrene: Secondary | ICD-10-CM | POA: Diagnosis not present

## 2019-07-20 DIAGNOSIS — Z89612 Acquired absence of left leg above knee: Secondary | ICD-10-CM | POA: Diagnosis not present

## 2019-07-20 DIAGNOSIS — Z4781 Encounter for orthopedic aftercare following surgical amputation: Secondary | ICD-10-CM | POA: Diagnosis not present

## 2019-07-21 DIAGNOSIS — I5032 Chronic diastolic (congestive) heart failure: Secondary | ICD-10-CM | POA: Diagnosis not present

## 2019-07-21 DIAGNOSIS — Z89612 Acquired absence of left leg above knee: Secondary | ICD-10-CM | POA: Diagnosis not present

## 2019-07-21 DIAGNOSIS — Z4781 Encounter for orthopedic aftercare following surgical amputation: Secondary | ICD-10-CM | POA: Diagnosis not present

## 2019-07-21 DIAGNOSIS — I110 Hypertensive heart disease with heart failure: Secondary | ICD-10-CM | POA: Diagnosis not present

## 2019-07-21 DIAGNOSIS — E1151 Type 2 diabetes mellitus with diabetic peripheral angiopathy without gangrene: Secondary | ICD-10-CM | POA: Diagnosis not present

## 2019-07-21 DIAGNOSIS — I70292 Other atherosclerosis of native arteries of extremities, left leg: Secondary | ICD-10-CM | POA: Diagnosis not present

## 2019-07-22 DIAGNOSIS — E1151 Type 2 diabetes mellitus with diabetic peripheral angiopathy without gangrene: Secondary | ICD-10-CM | POA: Diagnosis not present

## 2019-07-22 DIAGNOSIS — I70292 Other atherosclerosis of native arteries of extremities, left leg: Secondary | ICD-10-CM | POA: Diagnosis not present

## 2019-07-22 DIAGNOSIS — I110 Hypertensive heart disease with heart failure: Secondary | ICD-10-CM | POA: Diagnosis not present

## 2019-07-22 DIAGNOSIS — I5032 Chronic diastolic (congestive) heart failure: Secondary | ICD-10-CM | POA: Diagnosis not present

## 2019-07-22 DIAGNOSIS — Z89612 Acquired absence of left leg above knee: Secondary | ICD-10-CM | POA: Diagnosis not present

## 2019-07-22 DIAGNOSIS — Z4781 Encounter for orthopedic aftercare following surgical amputation: Secondary | ICD-10-CM | POA: Diagnosis not present

## 2019-07-26 ENCOUNTER — Other Ambulatory Visit: Payer: Self-pay

## 2019-07-26 ENCOUNTER — Other Ambulatory Visit (HOSPITAL_COMMUNITY)
Admission: RE | Admit: 2019-07-26 | Discharge: 2019-07-26 | Disposition: A | Discharge status: Home / Self Care | Payer: Medicare Other | Source: Ambulatory Visit | Attending: Physician Assistant | Admitting: Physician Assistant

## 2019-07-26 ENCOUNTER — Encounter: Payer: Self-pay | Admitting: Physician Assistant

## 2019-07-26 ENCOUNTER — Ambulatory Visit (INDEPENDENT_AMBULATORY_CARE_PROVIDER_SITE_OTHER): Payer: Medicare Other | Admitting: Physician Assistant

## 2019-07-26 ENCOUNTER — Telehealth: Payer: Self-pay

## 2019-07-26 VITALS — BP 166/84 | HR 85 | Temp 97.0°F | Ht 72.0 in

## 2019-07-26 DIAGNOSIS — I428 Other cardiomyopathies: Secondary | ICD-10-CM | POA: Diagnosis not present

## 2019-07-26 DIAGNOSIS — I779 Disorder of arteries and arterioles, unspecified: Secondary | ICD-10-CM | POA: Diagnosis not present

## 2019-07-26 DIAGNOSIS — I5032 Chronic diastolic (congestive) heart failure: Secondary | ICD-10-CM

## 2019-07-26 DIAGNOSIS — Z89612 Acquired absence of left leg above knee: Secondary | ICD-10-CM | POA: Diagnosis not present

## 2019-07-26 DIAGNOSIS — E1151 Type 2 diabetes mellitus with diabetic peripheral angiopathy without gangrene: Secondary | ICD-10-CM | POA: Diagnosis not present

## 2019-07-26 DIAGNOSIS — Z4781 Encounter for orthopedic aftercare following surgical amputation: Secondary | ICD-10-CM | POA: Diagnosis not present

## 2019-07-26 DIAGNOSIS — I4819 Other persistent atrial fibrillation: Secondary | ICD-10-CM | POA: Diagnosis not present

## 2019-07-26 DIAGNOSIS — I472 Ventricular tachycardia: Secondary | ICD-10-CM

## 2019-07-26 DIAGNOSIS — Z79899 Other long term (current) drug therapy: Secondary | ICD-10-CM

## 2019-07-26 DIAGNOSIS — I110 Hypertensive heart disease with heart failure: Secondary | ICD-10-CM | POA: Diagnosis not present

## 2019-07-26 DIAGNOSIS — I4729 Other ventricular tachycardia: Secondary | ICD-10-CM

## 2019-07-26 DIAGNOSIS — I10 Essential (primary) hypertension: Secondary | ICD-10-CM | POA: Diagnosis not present

## 2019-07-26 DIAGNOSIS — I739 Peripheral vascular disease, unspecified: Secondary | ICD-10-CM

## 2019-07-26 DIAGNOSIS — I70292 Other atherosclerosis of native arteries of extremities, left leg: Secondary | ICD-10-CM | POA: Diagnosis not present

## 2019-07-26 LAB — BASIC METABOLIC PANEL
Anion gap: 10 (ref 5–15)
BUN: 17 mg/dL (ref 8–23)
CO2: 28 mmol/L (ref 22–32)
Calcium: 9.4 mg/dL (ref 8.9–10.3)
Chloride: 103 mmol/L (ref 98–111)
Creatinine, Ser: 1.13 mg/dL (ref 0.61–1.24)
GFR calc Af Amer: >60 mL/min (ref >60)
GFR calc non Af Amer: 58 mL/min — ABNORMAL LOW (ref >60)
Glucose, Bld: 146 mg/dL — ABNORMAL HIGH (ref 70–99)
Potassium: 3.2 mmol/L — ABNORMAL LOW (ref 3.5–5.1)
Sodium: 141 mmol/L (ref 135–145)

## 2019-07-26 LAB — CBC
HCT: 36.5 % — ABNORMAL LOW (ref 39.0–52.0)
Hemoglobin: 11.5 g/dL — ABNORMAL LOW (ref 13.0–17.0)
MCH: 29.8 pg (ref 26.0–34.0)
MCHC: 31.5 g/dL (ref 30.0–36.0)
MCV: 94.6 fL (ref 80.0–100.0)
Platelets: 213 10*3/uL (ref 150–400)
RBC: 3.86 MIL/uL — ABNORMAL LOW (ref 4.22–5.81)
RDW: 14.7 % (ref 11.5–15.5)
WBC: 4.4 10*3/uL (ref 4.0–10.5)
nRBC: 0 % (ref 0.0–0.2)

## 2019-07-26 MED ORDER — POTASSIUM CHLORIDE CRYS ER 20 MEQ PO TBCR: EXTENDED_RELEASE_TABLET | ORAL | 3 refills | Status: DC

## 2019-07-26 NOTE — Telephone Encounter (Signed)
-----  Message from Imogene Burn, PA-C sent at 07/26/2019  3:01 PM EST ----- Anemia improved but calcium low at 3.2.  Start potassium K. Dur 20 mEq take 2 today and 2 tomorrow then once daily after.  Repeat be met in 2 weeks

## 2019-07-26 NOTE — Patient Instructions (Signed)
Medication Instructions: ,instcur   Labwork: BMEt,cbc   Procedures/Testing: None  Follow-Up: 6-8 weeks with Dr.Branch    Any Additional Special Instructions Will Be Listed Below (If Applicable).     If you need a refill on your cardiac medications before your next appointment, please call your pharmacy.

## 2019-07-26 NOTE — Telephone Encounter (Signed)
I spoke with patient and family.They will start potassium as directed and I mailed lab slip

## 2019-07-27 DIAGNOSIS — I70292 Other atherosclerosis of native arteries of extremities, left leg: Secondary | ICD-10-CM | POA: Diagnosis not present

## 2019-07-27 DIAGNOSIS — I110 Hypertensive heart disease with heart failure: Secondary | ICD-10-CM | POA: Diagnosis not present

## 2019-07-27 DIAGNOSIS — Z89612 Acquired absence of left leg above knee: Secondary | ICD-10-CM | POA: Diagnosis not present

## 2019-07-27 DIAGNOSIS — I5032 Chronic diastolic (congestive) heart failure: Secondary | ICD-10-CM | POA: Diagnosis not present

## 2019-07-27 DIAGNOSIS — Z4781 Encounter for orthopedic aftercare following surgical amputation: Secondary | ICD-10-CM | POA: Diagnosis not present

## 2019-07-27 DIAGNOSIS — E1151 Type 2 diabetes mellitus with diabetic peripheral angiopathy without gangrene: Secondary | ICD-10-CM | POA: Diagnosis not present

## 2019-07-28 DIAGNOSIS — E1151 Type 2 diabetes mellitus with diabetic peripheral angiopathy without gangrene: Secondary | ICD-10-CM | POA: Diagnosis not present

## 2019-07-28 DIAGNOSIS — I110 Hypertensive heart disease with heart failure: Secondary | ICD-10-CM | POA: Diagnosis not present

## 2019-07-28 DIAGNOSIS — I5032 Chronic diastolic (congestive) heart failure: Secondary | ICD-10-CM | POA: Diagnosis not present

## 2019-07-28 DIAGNOSIS — I70292 Other atherosclerosis of native arteries of extremities, left leg: Secondary | ICD-10-CM | POA: Diagnosis not present

## 2019-07-28 DIAGNOSIS — Z89612 Acquired absence of left leg above knee: Secondary | ICD-10-CM | POA: Diagnosis not present

## 2019-07-28 DIAGNOSIS — Z4781 Encounter for orthopedic aftercare following surgical amputation: Secondary | ICD-10-CM | POA: Diagnosis not present

## 2019-07-30 DIAGNOSIS — I70292 Other atherosclerosis of native arteries of extremities, left leg: Secondary | ICD-10-CM | POA: Diagnosis not present

## 2019-07-30 DIAGNOSIS — E1151 Type 2 diabetes mellitus with diabetic peripheral angiopathy without gangrene: Secondary | ICD-10-CM | POA: Diagnosis not present

## 2019-07-30 DIAGNOSIS — Z4781 Encounter for orthopedic aftercare following surgical amputation: Secondary | ICD-10-CM | POA: Diagnosis not present

## 2019-07-30 DIAGNOSIS — I110 Hypertensive heart disease with heart failure: Secondary | ICD-10-CM | POA: Diagnosis not present

## 2019-07-30 DIAGNOSIS — Z89612 Acquired absence of left leg above knee: Secondary | ICD-10-CM | POA: Diagnosis not present

## 2019-07-30 DIAGNOSIS — I5032 Chronic diastolic (congestive) heart failure: Secondary | ICD-10-CM | POA: Diagnosis not present

## 2019-08-03 DIAGNOSIS — Z4781 Encounter for orthopedic aftercare following surgical amputation: Secondary | ICD-10-CM | POA: Diagnosis not present

## 2019-08-03 DIAGNOSIS — E1151 Type 2 diabetes mellitus with diabetic peripheral angiopathy without gangrene: Secondary | ICD-10-CM | POA: Diagnosis not present

## 2019-08-03 DIAGNOSIS — I5032 Chronic diastolic (congestive) heart failure: Secondary | ICD-10-CM | POA: Diagnosis not present

## 2019-08-03 DIAGNOSIS — I70292 Other atherosclerosis of native arteries of extremities, left leg: Secondary | ICD-10-CM | POA: Diagnosis not present

## 2019-08-03 DIAGNOSIS — Z89612 Acquired absence of left leg above knee: Secondary | ICD-10-CM | POA: Diagnosis not present

## 2019-08-03 DIAGNOSIS — I110 Hypertensive heart disease with heart failure: Secondary | ICD-10-CM | POA: Diagnosis not present

## 2019-08-04 DIAGNOSIS — E1151 Type 2 diabetes mellitus with diabetic peripheral angiopathy without gangrene: Secondary | ICD-10-CM | POA: Diagnosis not present

## 2019-08-04 DIAGNOSIS — Z4781 Encounter for orthopedic aftercare following surgical amputation: Secondary | ICD-10-CM | POA: Diagnosis not present

## 2019-08-04 DIAGNOSIS — I5032 Chronic diastolic (congestive) heart failure: Secondary | ICD-10-CM | POA: Diagnosis not present

## 2019-08-04 DIAGNOSIS — I110 Hypertensive heart disease with heart failure: Secondary | ICD-10-CM | POA: Diagnosis not present

## 2019-08-04 DIAGNOSIS — Z89612 Acquired absence of left leg above knee: Secondary | ICD-10-CM | POA: Diagnosis not present

## 2019-08-04 DIAGNOSIS — I70292 Other atherosclerosis of native arteries of extremities, left leg: Secondary | ICD-10-CM | POA: Diagnosis not present

## 2019-08-06 DIAGNOSIS — I70292 Other atherosclerosis of native arteries of extremities, left leg: Secondary | ICD-10-CM | POA: Diagnosis not present

## 2019-08-06 DIAGNOSIS — I5032 Chronic diastolic (congestive) heart failure: Secondary | ICD-10-CM | POA: Diagnosis not present

## 2019-08-06 DIAGNOSIS — I110 Hypertensive heart disease with heart failure: Secondary | ICD-10-CM | POA: Diagnosis not present

## 2019-08-06 DIAGNOSIS — E1151 Type 2 diabetes mellitus with diabetic peripheral angiopathy without gangrene: Secondary | ICD-10-CM | POA: Diagnosis not present

## 2019-08-06 DIAGNOSIS — Z89612 Acquired absence of left leg above knee: Secondary | ICD-10-CM | POA: Diagnosis not present

## 2019-08-06 DIAGNOSIS — Z4781 Encounter for orthopedic aftercare following surgical amputation: Secondary | ICD-10-CM | POA: Diagnosis not present

## 2019-08-09 ENCOUNTER — Encounter: Payer: Self-pay | Admitting: Surgery

## 2019-08-09 ENCOUNTER — Other Ambulatory Visit: Payer: Self-pay

## 2019-08-09 ENCOUNTER — Ambulatory Visit (INDEPENDENT_AMBULATORY_CARE_PROVIDER_SITE_OTHER): Payer: Self-pay | Admitting: Physician Assistant

## 2019-08-09 VITALS — BP 159/84 | HR 96 | Temp 97.6°F | Resp 20 | Ht 72.0 in | Wt 196.0 lb

## 2019-08-09 DIAGNOSIS — Z4781 Encounter for orthopedic aftercare following surgical amputation: Secondary | ICD-10-CM | POA: Diagnosis not present

## 2019-08-09 DIAGNOSIS — I739 Peripheral vascular disease, unspecified: Secondary | ICD-10-CM

## 2019-08-09 DIAGNOSIS — I110 Hypertensive heart disease with heart failure: Secondary | ICD-10-CM | POA: Diagnosis not present

## 2019-08-09 DIAGNOSIS — Z89612 Acquired absence of left leg above knee: Secondary | ICD-10-CM

## 2019-08-09 DIAGNOSIS — I5032 Chronic diastolic (congestive) heart failure: Secondary | ICD-10-CM | POA: Diagnosis not present

## 2019-08-09 DIAGNOSIS — I70213 Atherosclerosis of native arteries of extremities with intermittent claudication, bilateral legs: Secondary | ICD-10-CM

## 2019-08-09 DIAGNOSIS — Z89619 Acquired absence of unspecified leg above knee: Secondary | ICD-10-CM | POA: Insufficient documentation

## 2019-08-09 DIAGNOSIS — I70292 Other atherosclerosis of native arteries of extremities, left leg: Secondary | ICD-10-CM | POA: Diagnosis not present

## 2019-08-09 DIAGNOSIS — E1151 Type 2 diabetes mellitus with diabetic peripheral angiopathy without gangrene: Secondary | ICD-10-CM | POA: Diagnosis not present

## 2019-08-09 NOTE — Progress Notes (Signed)
    Postoperative Visit    History of Present Illness   Warren Mcdonald is a 86 y.o. male who presents for postoperative follow-up for: left above-the-knee amputation by Dr. Myra Gianotti (Date: 07/02/19).  He had undergone tibial intervention and fourth toe amputation which failed to heal prior to above-the-knee amputation.  Patient states his stump is now well-healed.  Patient is now concerned of blistering on his right medial lower leg that started this week.  ABI was within normal limits in January however his anterior tibial artery is occluded and posterior tibial artery appeared calcified and had a monophasic waveform.  Angiography in December 2020 did not evaluate right lower extremity as this was performed with CO2 for left lower extremity in a patient with CKD.  For VQI Use Only   PRE-ADM LIVING: Home  AMB STATUS: Wheelchair   Physical Examination   Vitals:   08/09/19 1515  BP: (!) 159/84  Pulse: 96  Resp: 20  Temp: 97.6 F (36.4 C)  SpO2: 99%    LLE: Stump incision is healed.  Staples are intact. RLE: Brisk monophasic PT signal; large blister medial right lower leg   Medical Decision Making   Warren Mcdonald is a 86 y.o. male who presents s/p left above-the-knee amputation.  New blistering of right lower extremity   Left AKA well-healed; staples removed in office today  Inferior border of right medial lower leg blister incised and drained  Continue current wound care with home health  I have ordered a arterial duplex and repeat ABI and patient will return for wound check in 1 month to see PA  Emilie Rutter PA-C [facility] Office: [phone]  Clinic MD: Myra Gianotti

## 2019-08-10 ENCOUNTER — Other Ambulatory Visit: Payer: Self-pay | Admitting: *Deleted

## 2019-08-10 DIAGNOSIS — I739 Peripheral vascular disease, unspecified: Secondary | ICD-10-CM

## 2019-08-10 DIAGNOSIS — Z95828 Presence of other vascular implants and grafts: Secondary | ICD-10-CM

## 2019-08-11 ENCOUNTER — Telehealth: Payer: Self-pay

## 2019-08-11 DIAGNOSIS — I70292 Other atherosclerosis of native arteries of extremities, left leg: Secondary | ICD-10-CM | POA: Diagnosis not present

## 2019-08-11 DIAGNOSIS — Z4781 Encounter for orthopedic aftercare following surgical amputation: Secondary | ICD-10-CM | POA: Diagnosis not present

## 2019-08-11 DIAGNOSIS — E1151 Type 2 diabetes mellitus with diabetic peripheral angiopathy without gangrene: Secondary | ICD-10-CM | POA: Diagnosis not present

## 2019-08-11 DIAGNOSIS — Z89612 Acquired absence of left leg above knee: Secondary | ICD-10-CM | POA: Diagnosis not present

## 2019-08-11 DIAGNOSIS — I110 Hypertensive heart disease with heart failure: Secondary | ICD-10-CM | POA: Diagnosis not present

## 2019-08-11 DIAGNOSIS — I5032 Chronic diastolic (congestive) heart failure: Secondary | ICD-10-CM | POA: Diagnosis not present

## 2019-08-11 NOTE — Telephone Encounter (Signed)
Returned call to [place]. In speaking with her, she stated pt has a blister on his right leg. She stated there are no signs of fluid retention in leg. He is unable to weigh due to leg amputation, but there is no edema in pt's face, hand and lungs are clear with no SOB. She is concerned about the blister and did not know what office to call. I will forward to [facility] Emilie Rutter, PA-C) as he was just seen in office on 08/09/2019.

## 2019-08-11 NOTE — Telephone Encounter (Signed)
rec'd call from [facility] [id number] Right Lower Ext measures 15cm x 4.5cm Recent left leg amputated.  Please call Marisue Ivan [phone]  Thanks renee

## 2019-08-12 DIAGNOSIS — E1151 Type 2 diabetes mellitus with diabetic peripheral angiopathy without gangrene: Secondary | ICD-10-CM | POA: Diagnosis not present

## 2019-08-12 DIAGNOSIS — I70292 Other atherosclerosis of native arteries of extremities, left leg: Secondary | ICD-10-CM | POA: Diagnosis not present

## 2019-08-12 DIAGNOSIS — I110 Hypertensive heart disease with heart failure: Secondary | ICD-10-CM | POA: Diagnosis not present

## 2019-08-12 DIAGNOSIS — I5032 Chronic diastolic (congestive) heart failure: Secondary | ICD-10-CM | POA: Diagnosis not present

## 2019-08-12 DIAGNOSIS — Z89612 Acquired absence of left leg above knee: Secondary | ICD-10-CM | POA: Diagnosis not present

## 2019-08-12 DIAGNOSIS — Z4781 Encounter for orthopedic aftercare following surgical amputation: Secondary | ICD-10-CM | POA: Diagnosis not present

## 2019-08-13 ENCOUNTER — Telehealth: Payer: Self-pay

## 2019-08-13 NOTE — Telephone Encounter (Signed)
Marisue Ivan from [facility] called yesterday with concerns about patient's R leg.  It is swelling and blistering again.  Family is concerned since patient already lost his left leg.  Can they be seen any earlier than 04/15 with Vascular Labs.   We were able to get patient in on 09/02/19 with labs.  Marisue Ivan will make pt aware.   Ernst Spell., LPN

## 2019-08-16 DIAGNOSIS — Z4781 Encounter for orthopedic aftercare following surgical amputation: Secondary | ICD-10-CM | POA: Diagnosis not present

## 2019-08-16 DIAGNOSIS — E1151 Type 2 diabetes mellitus with diabetic peripheral angiopathy without gangrene: Secondary | ICD-10-CM | POA: Diagnosis not present

## 2019-08-16 DIAGNOSIS — I70292 Other atherosclerosis of native arteries of extremities, left leg: Secondary | ICD-10-CM | POA: Diagnosis not present

## 2019-08-16 DIAGNOSIS — I110 Hypertensive heart disease with heart failure: Secondary | ICD-10-CM | POA: Diagnosis not present

## 2019-08-16 DIAGNOSIS — Z89612 Acquired absence of left leg above knee: Secondary | ICD-10-CM | POA: Diagnosis not present

## 2019-08-16 DIAGNOSIS — I5032 Chronic diastolic (congestive) heart failure: Secondary | ICD-10-CM | POA: Diagnosis not present

## 2019-08-16 NOTE — Telephone Encounter (Signed)
If blister is worsening, he will need to be evaluated in the office.  He will need to have an ABI and a R leg arterial duplex to review at that visit as well. Thanks, [employer]

## 2019-08-17 DIAGNOSIS — I5032 Chronic diastolic (congestive) heart failure: Secondary | ICD-10-CM | POA: Diagnosis not present

## 2019-08-17 DIAGNOSIS — Z7984 Long term (current) use of oral hypoglycemic drugs: Secondary | ICD-10-CM | POA: Diagnosis not present

## 2019-08-17 DIAGNOSIS — S80821D Blister (nonthermal), right lower leg, subsequent encounter: Secondary | ICD-10-CM | POA: Diagnosis not present

## 2019-08-17 DIAGNOSIS — I4891 Unspecified atrial fibrillation: Secondary | ICD-10-CM | POA: Diagnosis not present

## 2019-08-17 DIAGNOSIS — I110 Hypertensive heart disease with heart failure: Secondary | ICD-10-CM | POA: Diagnosis not present

## 2019-08-17 DIAGNOSIS — Z741 Need for assistance with personal care: Secondary | ICD-10-CM | POA: Diagnosis not present

## 2019-08-17 DIAGNOSIS — Z4781 Encounter for orthopedic aftercare following surgical amputation: Secondary | ICD-10-CM | POA: Diagnosis not present

## 2019-08-17 DIAGNOSIS — E1151 Type 2 diabetes mellitus with diabetic peripheral angiopathy without gangrene: Secondary | ICD-10-CM | POA: Diagnosis not present

## 2019-08-17 DIAGNOSIS — R262 Difficulty in walking, not elsewhere classified: Secondary | ICD-10-CM | POA: Diagnosis not present

## 2019-08-17 DIAGNOSIS — Z89612 Acquired absence of left leg above knee: Secondary | ICD-10-CM | POA: Diagnosis not present

## 2019-08-17 DIAGNOSIS — I70292 Other atherosclerosis of native arteries of extremities, left leg: Secondary | ICD-10-CM | POA: Diagnosis not present

## 2019-08-17 NOTE — Telephone Encounter (Signed)
Will forward to Nurse at that office to schedule as I am not sure how they schedule testing.

## 2019-08-18 DIAGNOSIS — I5032 Chronic diastolic (congestive) heart failure: Secondary | ICD-10-CM | POA: Diagnosis not present

## 2019-08-18 DIAGNOSIS — S80821D Blister (nonthermal), right lower leg, subsequent encounter: Secondary | ICD-10-CM | POA: Diagnosis not present

## 2019-08-18 DIAGNOSIS — Z89612 Acquired absence of left leg above knee: Secondary | ICD-10-CM | POA: Diagnosis not present

## 2019-08-18 DIAGNOSIS — Z4781 Encounter for orthopedic aftercare following surgical amputation: Secondary | ICD-10-CM | POA: Diagnosis not present

## 2019-08-18 DIAGNOSIS — I110 Hypertensive heart disease with heart failure: Secondary | ICD-10-CM | POA: Diagnosis not present

## 2019-08-18 DIAGNOSIS — I70292 Other atherosclerosis of native arteries of extremities, left leg: Secondary | ICD-10-CM | POA: Diagnosis not present

## 2019-08-23 DIAGNOSIS — Z89612 Acquired absence of left leg above knee: Secondary | ICD-10-CM | POA: Diagnosis not present

## 2019-08-23 DIAGNOSIS — I5032 Chronic diastolic (congestive) heart failure: Secondary | ICD-10-CM | POA: Diagnosis not present

## 2019-08-23 DIAGNOSIS — I70292 Other atherosclerosis of native arteries of extremities, left leg: Secondary | ICD-10-CM | POA: Diagnosis not present

## 2019-08-23 DIAGNOSIS — S80821D Blister (nonthermal), right lower leg, subsequent encounter: Secondary | ICD-10-CM | POA: Diagnosis not present

## 2019-08-23 DIAGNOSIS — Z4781 Encounter for orthopedic aftercare following surgical amputation: Secondary | ICD-10-CM | POA: Diagnosis not present

## 2019-08-23 DIAGNOSIS — I110 Hypertensive heart disease with heart failure: Secondary | ICD-10-CM | POA: Diagnosis not present

## 2019-08-24 DIAGNOSIS — I70292 Other atherosclerosis of native arteries of extremities, left leg: Secondary | ICD-10-CM | POA: Diagnosis not present

## 2019-08-24 DIAGNOSIS — I5032 Chronic diastolic (congestive) heart failure: Secondary | ICD-10-CM | POA: Diagnosis not present

## 2019-08-24 DIAGNOSIS — S80821D Blister (nonthermal), right lower leg, subsequent encounter: Secondary | ICD-10-CM | POA: Diagnosis not present

## 2019-08-24 DIAGNOSIS — Z89612 Acquired absence of left leg above knee: Secondary | ICD-10-CM | POA: Diagnosis not present

## 2019-08-24 DIAGNOSIS — I110 Hypertensive heart disease with heart failure: Secondary | ICD-10-CM | POA: Diagnosis not present

## 2019-08-24 DIAGNOSIS — Z4781 Encounter for orthopedic aftercare following surgical amputation: Secondary | ICD-10-CM | POA: Diagnosis not present

## 2019-08-30 DIAGNOSIS — Z89612 Acquired absence of left leg above knee: Secondary | ICD-10-CM | POA: Diagnosis not present

## 2019-08-30 DIAGNOSIS — I5032 Chronic diastolic (congestive) heart failure: Secondary | ICD-10-CM | POA: Diagnosis not present

## 2019-08-30 DIAGNOSIS — I70292 Other atherosclerosis of native arteries of extremities, left leg: Secondary | ICD-10-CM | POA: Diagnosis not present

## 2019-08-30 DIAGNOSIS — S80821D Blister (nonthermal), right lower leg, subsequent encounter: Secondary | ICD-10-CM | POA: Diagnosis not present

## 2019-08-30 DIAGNOSIS — Z4781 Encounter for orthopedic aftercare following surgical amputation: Secondary | ICD-10-CM | POA: Diagnosis not present

## 2019-08-30 DIAGNOSIS — I110 Hypertensive heart disease with heart failure: Secondary | ICD-10-CM | POA: Diagnosis not present

## 2019-09-01 DIAGNOSIS — S80821D Blister (nonthermal), right lower leg, subsequent encounter: Secondary | ICD-10-CM | POA: Diagnosis not present

## 2019-09-01 DIAGNOSIS — I70292 Other atherosclerosis of native arteries of extremities, left leg: Secondary | ICD-10-CM | POA: Diagnosis not present

## 2019-09-01 DIAGNOSIS — I5032 Chronic diastolic (congestive) heart failure: Secondary | ICD-10-CM | POA: Diagnosis not present

## 2019-09-01 DIAGNOSIS — Z4781 Encounter for orthopedic aftercare following surgical amputation: Secondary | ICD-10-CM | POA: Diagnosis not present

## 2019-09-01 DIAGNOSIS — Z89612 Acquired absence of left leg above knee: Secondary | ICD-10-CM | POA: Diagnosis not present

## 2019-09-01 DIAGNOSIS — I110 Hypertensive heart disease with heart failure: Secondary | ICD-10-CM | POA: Diagnosis not present

## 2019-09-02 ENCOUNTER — Ambulatory Visit (INDEPENDENT_AMBULATORY_CARE_PROVIDER_SITE_OTHER): Payer: Self-pay | Admitting: Physician Assistant

## 2019-09-02 ENCOUNTER — Other Ambulatory Visit: Payer: Self-pay

## 2019-09-02 ENCOUNTER — Ambulatory Visit (INDEPENDENT_AMBULATORY_CARE_PROVIDER_SITE_OTHER)
Admission: RE | Admit: 2019-09-02 | Discharge: 2019-09-02 | Disposition: A | Discharge status: Home / Self Care | Payer: Medicare Other | Source: Ambulatory Visit | Attending: Physician Assistant | Admitting: Physician Assistant

## 2019-09-02 ENCOUNTER — Ambulatory Visit (HOSPITAL_COMMUNITY)
Admission: RE | Admit: 2019-09-02 | Discharge: 2019-09-02 | Disposition: A | Discharge status: Home / Self Care | Payer: Medicare Other | Source: Ambulatory Visit | Attending: Physician Assistant | Admitting: Physician Assistant

## 2019-09-02 VITALS — BP 147/92 | HR 128 | Temp 97.8°F | Resp 16

## 2019-09-02 DIAGNOSIS — Z95828 Presence of other vascular implants and grafts: Secondary | ICD-10-CM | POA: Diagnosis not present

## 2019-09-02 DIAGNOSIS — I739 Peripheral vascular disease, unspecified: Secondary | ICD-10-CM

## 2019-09-02 DIAGNOSIS — Z89612 Acquired absence of left leg above knee: Secondary | ICD-10-CM

## 2019-09-02 NOTE — Progress Notes (Addendum)
Established PAD Patient   History of Present Illness   Warren Mcdonald is a 86 y.o. (March 21, 1930) male who presentsto go over vascular studies related to PAD.  Most recently patient underwent left above-the-knee amputation by Dr. Arbie Cookey on 07/02/2019.  He had history of left lower extremity bypass and required tibial intervention due to a fourth toe wound which ultimately resulted in amputation.  In 2010 he underwent TP trunk and PT angioplasty due to an ulceration.  He returns today for arterial studies due to right medial lower leg blistering.  Patient states since last office visit blistering and wounds of his right medial lower leg have completely healed.  He denies any sores on his right foot.  Patient also states left above-the-knee amputation incision is well-healed.  He is working on regaining his strength since hospital stay in February.  He is on Eliquis daily.  The patient's PMH, PSH, SH, and FamHx were reviewed and are unchanged from prior visit.  Current Outpatient Medications  Medication Sig Dispense Refill  . apixaban (ELIQUIS) 5 MG TABS tablet Take 1 tablet (5 mg total) by mouth 2 (two) times daily. 60 tablet 0  . atorvastatin (LIPITOR) 20 MG tablet Take 1 tablet (20 mg total) by mouth daily at 6 PM. 30 tablet 3  . bisacodyl (DULCOLAX) 5 MG EC tablet Take 5 mg by mouth daily as needed for moderate constipation.    . cetirizine (ZYRTEC) 10 MG tablet Take 10 mg by mouth at bedtime.    Marland Kitchen diltiazem (CARDIZEM CD) 180 MG 24 hr capsule Take 1 capsule (180 mg total) by mouth daily. 30 capsule 0  . HYDROcodone-acetaminophen (NORCO/VICODIN) 5-325 MG tablet Take 1 tablet by mouth every 4 (four) hours as needed for moderate pain. 10 tablet 0  . latanoprost (XALATAN) 0.005 % ophthalmic solution 1 drop at bedtime.    Marland Kitchen lubiprostone (AMITIZA) 24 MCG capsule Take 24 mcg by mouth 2 (two) times daily with a meal.    . metFORMIN (GLUCOPHAGE) 500 MG tablet Take 500 mg by mouth 2 (two) times daily  with a meal.      . metoprolol succinate (TOPROL-XL) 100 MG 24 hr tablet Take 1 tablet (100 mg total) by mouth daily. Take with or immediately following a meal. 30 tablet 1  . potassium chloride SA (KLOR-CON M20) 20 MEQ tablet 07/26/19 STARTING Today and Tomorrow, take 40 meq (2 tabs), then take 20 meq (1 tablet) daily thereafter 90 tablet 3  . traMADol (ULTRAM) 50 MG tablet Take 50 mg by mouth every 6 (six) hours as needed.     No current facility-administered medications for this visit.    REVIEW OF SYSTEMS (negative unless checked):   Cardiac:  []  Chest pain or chest pressure? []  Shortness of breath upon activity? []  Shortness of breath when lying flat? []  Irregular heart rhythm?  Vascular:  []  Pain in calf, thigh, or hip brought on by walking? []  Pain in feet at night that wakes you up from your sleep? []  Blood clot in your veins? []  Leg swelling?  Pulmonary:  []  Oxygen at home? []  Productive cough? []  Wheezing?  Neurologic:  []  Sudden weakness in arms or legs? []  Sudden numbness in arms or legs? []  Sudden onset of difficult speaking or slurred speech? []  Temporary loss of vision in one eye? []  Problems with dizziness?  Gastrointestinal:  []  Blood in stool? []  Vomited blood?  Genitourinary:  []  Burning when urinating? []  Blood in urine?  Psychiatric:  []   Major depression  Hematologic:  []  Bleeding problems? []  Problems with blood clotting?  Dermatologic:  []  Rashes or ulcers?  Constitutional:  []  Fever or chills?  Ear/Nose/Throat:  []  Change in hearing? []  Nose bleeds? []  Sore throat?  Musculoskeletal:  []  Back pain? []  Joint pain? []  Muscle pain?   Physical Examination   Vitals:   09/02/19 0903  BP: (!) 147/92  Pulse: (!) 128  Resp: 16  Temp: 97.8 F (36.6 C)  SpO2: 100%   General:  WDWN in NAD; vital signs documented above Gait: Not observed HENT: WNL, normocephalic Pulmonary: normal non-labored breathing , without Rales, rhonchi,   wheezing Cardiac: regular HR Abdomen: soft, NT, no masses Skin: without rashes Vascular Exam/Pulses:  Right Left  Radial 2+ (normal) 2+ (normal)  DP absent AKA  PT absent AKA   Extremities: medial lower leg wound completely healed; no tissue loss R foot  Musculoskeletal: no muscle wasting or atrophy  Neurologic: A&O X 3;  No focal weakness or paresthesias are detected Psychiatric:  The pt has Normal affect.  Non-Invasive Vascular Imaging   ABI  R:   ABI: 0.98,   PT: mono  DP: mono  TBI:  [place]  Right lower extremity arterial duplex demonstrates 50 to 75% stenosis distal SFA Patient also has diffuse tibial disease   Medical Decision Making   Warren Mcdonald is a 86 y.o. male who presents to go over RLE vascular studies   Arterial duplex demonstrates SFA and tibial disease however right medial lower leg wound has completely healed and he does not have any tissue loss on his foot or toes  No indication for further work-up at this time  Recheck ABI in 1 year  Call/return office sooner if rest pain or tissue loss develops in right foot   L above knee amputation is well healed.  The patient's comorbidities will not affect his ability to function with a prosthetic.  This will be his first prosthesis.  He verbally communicated a strong desire to use a prosthesis.  He is currently in a wheelchair however is expected to be able to ambulate with assistance once he receives his prosthesis.  He is a K2 level ambulator that will walk inside and outside his home with a prosthesis over low level environmental barriers.   Dagoberto Ligas PA-C [facility] [place] Office: [phone]  Clinic MD: Scot Dock

## 2019-09-03 ENCOUNTER — Other Ambulatory Visit: Payer: Self-pay | Admitting: *Deleted

## 2019-09-03 DIAGNOSIS — I739 Peripheral vascular disease, unspecified: Secondary | ICD-10-CM

## 2019-09-06 DIAGNOSIS — I110 Hypertensive heart disease with heart failure: Secondary | ICD-10-CM | POA: Diagnosis not present

## 2019-09-06 DIAGNOSIS — Z89612 Acquired absence of left leg above knee: Secondary | ICD-10-CM | POA: Diagnosis not present

## 2019-09-06 DIAGNOSIS — I5032 Chronic diastolic (congestive) heart failure: Secondary | ICD-10-CM | POA: Diagnosis not present

## 2019-09-06 DIAGNOSIS — Z4781 Encounter for orthopedic aftercare following surgical amputation: Secondary | ICD-10-CM | POA: Diagnosis not present

## 2019-09-06 DIAGNOSIS — I70292 Other atherosclerosis of native arteries of extremities, left leg: Secondary | ICD-10-CM | POA: Diagnosis not present

## 2019-09-06 DIAGNOSIS — S80821D Blister (nonthermal), right lower leg, subsequent encounter: Secondary | ICD-10-CM | POA: Diagnosis not present

## 2019-09-08 DIAGNOSIS — Z4781 Encounter for orthopedic aftercare following surgical amputation: Secondary | ICD-10-CM | POA: Diagnosis not present

## 2019-09-08 DIAGNOSIS — I110 Hypertensive heart disease with heart failure: Secondary | ICD-10-CM | POA: Diagnosis not present

## 2019-09-08 DIAGNOSIS — Z89612 Acquired absence of left leg above knee: Secondary | ICD-10-CM | POA: Diagnosis not present

## 2019-09-08 DIAGNOSIS — S80821D Blister (nonthermal), right lower leg, subsequent encounter: Secondary | ICD-10-CM | POA: Diagnosis not present

## 2019-09-08 DIAGNOSIS — I70292 Other atherosclerosis of native arteries of extremities, left leg: Secondary | ICD-10-CM | POA: Diagnosis not present

## 2019-09-08 DIAGNOSIS — I5032 Chronic diastolic (congestive) heart failure: Secondary | ICD-10-CM | POA: Diagnosis not present

## 2019-09-09 ENCOUNTER — Encounter (HOSPITAL_COMMUNITY): Payer: Medicare Other

## 2019-09-09 ENCOUNTER — Ambulatory Visit: Payer: Medicare Other

## 2019-09-14 DIAGNOSIS — I110 Hypertensive heart disease with heart failure: Secondary | ICD-10-CM | POA: Diagnosis not present

## 2019-09-14 DIAGNOSIS — I5032 Chronic diastolic (congestive) heart failure: Secondary | ICD-10-CM | POA: Diagnosis not present

## 2019-09-14 DIAGNOSIS — I70292 Other atherosclerosis of native arteries of extremities, left leg: Secondary | ICD-10-CM | POA: Diagnosis not present

## 2019-09-14 DIAGNOSIS — Z4781 Encounter for orthopedic aftercare following surgical amputation: Secondary | ICD-10-CM | POA: Diagnosis not present

## 2019-09-14 DIAGNOSIS — Z89612 Acquired absence of left leg above knee: Secondary | ICD-10-CM | POA: Diagnosis not present

## 2019-09-14 DIAGNOSIS — S80821D Blister (nonthermal), right lower leg, subsequent encounter: Secondary | ICD-10-CM | POA: Diagnosis not present

## 2019-09-15 ENCOUNTER — Telehealth: Payer: Self-pay

## 2019-09-15 NOTE — Telephone Encounter (Signed)
  Patient Consent for Virtual Visit         Warren Mcdonald has provided verbal consent on 09/15/2019 for a virtual visit (video or telephone).   CONSENT FOR VIRTUAL VISIT FOR:  Warren Mcdonald  By participating in this virtual visit I agree to the following:  I hereby voluntarily request, consent and authorize [facility] and its employed or contracted physicians, physician assistants, nurse practitioners or other licensed health care professionals (the Practitioner), to provide me with telemedicine health care services (the "Services") as deemed necessary by the treating Practitioner. I acknowledge and consent to receive the Services by the Practitioner via telemedicine. I understand that the telemedicine visit will involve communicating with the Practitioner through live audiovisual communication technology and the disclosure of certain medical information by electronic transmission. I acknowledge that I have been given the opportunity to request an in-person assessment or other available alternative prior to the telemedicine visit and am voluntarily participating in the telemedicine visit.  I understand that I have the right to withhold or withdraw my consent to the use of telemedicine in the course of my care at any time, without affecting my right to future care or treatment, and that the Practitioner or I may terminate the telemedicine visit at any time. I understand that I have the right to inspect all information obtained and/or recorded in the course of the telemedicine visit and may receive copies of available information for a reasonable fee.  I understand that some of the potential risks of receiving the Services via telemedicine include:  Marland Kitchen Delay or interruption in medical evaluation due to technological equipment failure or disruption; . Information transmitted may not be sufficient (e.g. poor resolution of images) to allow for appropriate medical decision making by the  Practitioner; and/or  . In rare instances, security protocols could fail, causing a breach of personal health information.  Furthermore, I acknowledge that it is my responsibility to provide information about my medical history, conditions and care that is complete and accurate to the best of my ability. I acknowledge that Practitioner's advice, recommendations, and/or decision may be based on factors not within their control, such as incomplete or inaccurate data provided by me or distortions of diagnostic images or specimens that may result from electronic transmissions. I understand that the practice of medicine is not an exact science and that Practitioner makes no warranties or guarantees regarding treatment outcomes. I acknowledge that a copy of this consent can be made available to me via my patient portal [facility] MyChart), or I can request a printed copy by calling the office of [facility].    I understand that my insurance will be billed for this visit.   I have read or had this consent read to me. . I understand the contents of this consent, which adequately explains the benefits and risks of the Services being provided via telemedicine.  . I have been provided ample opportunity to ask questions regarding this consent and the Services and have had my questions answered to my satisfaction. . I give my informed consent for the services to be provided through the use of telemedicine in my medical care

## 2019-09-16 DIAGNOSIS — Z4781 Encounter for orthopedic aftercare following surgical amputation: Secondary | ICD-10-CM | POA: Diagnosis not present

## 2019-09-17 ENCOUNTER — Encounter: Payer: Self-pay | Admitting: Cardiology

## 2019-09-17 ENCOUNTER — Telehealth (INDEPENDENT_AMBULATORY_CARE_PROVIDER_SITE_OTHER): Payer: Medicare Other | Admitting: Cardiology

## 2019-09-17 VITALS — BP 134/91 | HR 68 | Resp 18 | Wt 150.0 lb

## 2019-09-17 DIAGNOSIS — I110 Hypertensive heart disease with heart failure: Secondary | ICD-10-CM | POA: Diagnosis not present

## 2019-09-17 DIAGNOSIS — Z87891 Personal history of nicotine dependence: Secondary | ICD-10-CM

## 2019-09-17 DIAGNOSIS — E876 Hypokalemia: Secondary | ICD-10-CM

## 2019-09-17 DIAGNOSIS — I4891 Unspecified atrial fibrillation: Secondary | ICD-10-CM

## 2019-09-17 DIAGNOSIS — E785 Hyperlipidemia, unspecified: Secondary | ICD-10-CM

## 2019-09-17 DIAGNOSIS — E782 Mixed hyperlipidemia: Secondary | ICD-10-CM

## 2019-09-17 DIAGNOSIS — I5022 Chronic systolic (congestive) heart failure: Secondary | ICD-10-CM | POA: Diagnosis not present

## 2019-09-17 NOTE — Patient Instructions (Signed)
Medication Instructions:  Your physician recommends that you continue on your current medications as directed. Please refer to the Current Medication list given to you today.   Labwork: bmet magnesium  Testing/Procedures: Your physician has requested that you have an echocardiogram. Echocardiography is a painless test that uses sound waves to create images of your heart. It provides your doctor with information about the size and shape of your heart and how well your heart's chambers and valves are working. This procedure takes approximately one hour. There are no restrictions for this procedure.    Follow-Up: Your physician recommends that you schedule a follow-up appointment in: 4 months    Any Other Special Instructions Will Be Listed Below (If Applicable).     If you need a refill on your cardiac medications before your next appointment, please call your pharmacy.

## 2019-09-17 NOTE — Progress Notes (Signed)
Virtual Visit via Telephone Note   This visit type was conducted due to national recommendations for restrictions regarding the COVID-19 Pandemic (e.g. social distancing) in an effort to limit this patient's exposure and mitigate transmission in our community.  Due to his co-morbid illnesses, this patient is at least at moderate risk for complications without adequate follow up.  This format is felt to be most appropriate for this patient at this time.  The patient did not have access to video technology/had technical difficulties with video requiring transitioning to audio format only (telephone).  All issues noted in this document were discussed and addressed.  No physical exam could be performed with this format.  Please refer to the patient's chart for his  consent to telehealth for [facility].   The patient was identified using 2 identifiers.  Date:  09/17/2019   ID:  Warren Mcdonald, DOB Apr 22, 1930, MRN [id number]  Patient Location: Home Provider Location: Office  PCP:  Patient, No Pcp Per  Cardiologist:  Carlyle Dolly, MD  Electrophysiologist:  None   Evaluation Performed:  Follow-Up Visit  Chief Complaint:  Follow up visit  History of Present Illness:    Warren Mcdonald is a 86 y.o. male seen today for follow up of the following medical problems.  1. PAF -long history of PAF - issues earlier this year during admission with afib with RVR - rate controlled on diltiazem and toprol - on anticoag  - no recent palpitations - compliant with meds - no bleeding on eliquis   2. Chronic systolic HF -- echo during recent admit LVEF 40-45% - possibly tachy mediated. Cannot exclude possible CAD - treating medically, follow up LVEF.  - no SOB/DOE   3. HTN -compliant with meds  4. PAD - followed by vascular - s/p left AKA 06/2019  5. DM2 -followed by pcp  6. Hyperlipidemia - he is compliant with meds   Looking to establish with new pcp. Had seen  Dr Luan Mcdonald and then Dr Holly Bodily.    The patient does not have symptoms concerning for COVID-19 infection (fever, chills, cough, or new shortness of breath).    Past Medical History:  Diagnosis Date  . Abdominal distension (gaseous)   . Anemia, unspecified   . Chronic diastolic (congestive) heart failure ([place])   . Essential (primary) hypertension   . Hyperlipidemia   . PAF (paroxysmal atrial fibrillation) ([place])   . Peripheral vascular disease ([place]) 2011   Left above-knee popliteal to posterior tibial artery bypass   . PUD (peptic ulcer disease)   . Type 2 diabetes mellitus with diabetic peripheral angiopathy without gangrene [facility])    Past Surgical History:  Procedure Laterality Date  . ABDOMINAL AORTOGRAM N/A 05/24/2019   Procedure: ABDOMINAL AORTOGRAM;  Surgeon: Waynetta Sandy, MD;  Location: [place] [facility];  Service: Cardiovascular;  Laterality: N/A;  . AMPUTATION Left 06/16/2019   Procedure: AMPUTATION DIGIT FOURTH TOE LEFT FOOT;  Surgeon: Serafina Mitchell, MD;  Location: [place];  Service: Vascular;  Laterality: Left;  . AMPUTATION Left 06/26/2019   Procedure: AMPUTATION two DIGITs, left toe;  Surgeon: Waynetta Sandy, MD;  Location: [place];  Service: Vascular;  Laterality: Left;  . AMPUTATION Left 07/02/2019   Procedure: left AMPUTATION ABOVE KNEE;  Surgeon: Rosetta Posner, MD;  Location: [place];  Service: Vascular;  Laterality: Left;  . APPENDECTOMY    . CATARACT EXTRACTION W/ INTRAOCULAR LENS  IMPLANT, BILATERAL    . CHOLECYSTECTOMY N/A 09/08/2014  Procedure: CHOLECYSTECTOMY;  Surgeon: Aviva Signs Md, MD;  Location: [facility];  Service: General;  Laterality: N/A;  . EYE SURGERY    . I & D EXTREMITY Left 06/30/2019   Procedure: sharp incisonal DEBRIDEMENT of left foot including bone, tendon, muscle, and skin;  Surgeon: Marty Heck, MD;  Location: [place];  Service: Vascular;  Laterality: Left;  . LOWER EXTREMITY ANGIOGRAPHY Left 05/24/2019   Procedure: Lower  Extremity Angiography;  Surgeon: Waynetta Sandy, MD;  Location: [place] [facility];  Service: Cardiovascular;  Laterality: Left;  . PERIPHERAL VASCULAR BALLOON ANGIOPLASTY Left 05/24/2019   Procedure: PERIPHERAL VASCULAR BALLOON ANGIOPLASTY;  Surgeon: Waynetta Sandy, MD;  Location: [place] [facility];  Service: Cardiovascular;  Laterality: Left;  tibial bypass  . PR VEIN BYPASS GRAFT,AORTO-FEM-POP     Left above knee popliteal to posterior tibial artery bypass graft     Current Meds  Medication Sig  . apixaban (ELIQUIS) 5 MG TABS tablet Take 1 tablet (5 mg total) by mouth 2 (two) times daily.  Marland Kitchen atorvastatin (LIPITOR) 20 MG tablet Take 1 tablet (20 mg total) by mouth daily at 6 PM.  . bisacodyl (DULCOLAX) 5 MG EC tablet Take 5 mg by mouth daily as needed for moderate constipation.  . cetirizine (ZYRTEC) 10 MG tablet Take 10 mg by mouth at bedtime.  Marland Kitchen diltiazem (CARDIZEM CD) 180 MG 24 hr capsule Take 1 capsule (180 mg total) by mouth daily.  Marland Kitchen HYDROcodone-acetaminophen (NORCO/VICODIN) 5-325 MG tablet Take 1 tablet by mouth every 4 (four) hours as needed for moderate pain.  Marland Kitchen latanoprost (XALATAN) 0.005 % ophthalmic solution 1 drop at bedtime.  Marland Kitchen lubiprostone (AMITIZA) 24 MCG capsule Take 24 mcg by mouth 2 (two) times daily with a meal.  . metFORMIN (GLUCOPHAGE) 500 MG tablet Take 500 mg by mouth 2 (two) times daily with a meal.    . metoprolol succinate (TOPROL-XL) 100 MG 24 hr tablet Take 1 tablet (100 mg total) by mouth daily. Take with or immediately following a meal.  . potassium chloride SA (KLOR-CON M20) 20 MEQ tablet 07/26/19 STARTING Today and Tomorrow, take 40 meq (2 tabs), then take 20 meq (1 tablet) daily thereafter  . traMADol (ULTRAM) 50 MG tablet Take 50 mg by mouth every 6 (six) hours as needed.     Allergies:   Patient has no known allergies.   Social History   Tobacco Use  . Smoking status: Former Smoker    Types: Cigars    Quit date: 05/27/1988     Years since quitting: 31.3  . Smokeless tobacco: Former Systems developer    Types: [place] date: 05/27/1988  Substance Use Topics  . Alcohol use: No    Alcohol/week: 0.0 standard drinks  . Drug use: No     Family Hx: The patient's family history includes Cancer in his brother, daughter, and sister; Deep vein thrombosis in his sister; Diabetes in his daughter, sister, and another family member; Heart attack in his father and mother; Heart disease in his father and mother; Hypertension in his mother; Prostate cancer in an other family member.  ROS:   Please see the history of present illness.     All other systems reviewed and are negative.   Prior CV studies:   The following studies were reviewed today:  08/2014 echo Study Conclusions  - Left ventricle: The cavity size was normal. Wall thickness was increased in a pattern of mild LVH. Systolic function was normal.  The estimated ejection fraction was in the range of 55% to 60%. Wall motion was normal; there were no regional wall motion abnormalities. Findings consistent with left ventricular diastolic dysfunction, ungraded in the setting of atrial fibrillation. - Aortic valve: Trileaflet; mildly thickened, mildly calcified leaflets. There was trivial regurgitation. - Mitral valve: Calcified annulus. Loose portion of mitral chordal structure (possible from anteromedial papillary) noted. There was mild regurgitation. - Left atrium: The atrium was mildly dilated. - Right atrium: The atrium was mildly dilated. - Tricuspid valve: There was moderate regurgitation. - Pulmonary arteries: PA peak pressure: 41 mm Hg (S). - Pericardium, extracardiac: There was no pericardial effusion.  Impressions:  - Mild LVH with LVEF 55-60%, features consistent with diastolic dysfunction. Mild biatrial enlargement. 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 Sclerotic aortic valve with trivial  regurgitation. Moderate tricuspid regurgitation with mildly increased PASP 41 mmHg.   Labs/Other Tests and Data Reviewed:    EKG:  No ECG reviewed.  Recent Labs: 07/05/2019: Magnesium 2.0; TSH 2.795 07/26/2019: BUN 17; Creatinine, Ser 1.13; Hemoglobin 11.5; Platelets 213; Potassium 3.2; Sodium 141   Recent Lipid Panel Lab Results  Component Value Date/Time   CHOL 142 07/05/2019 10:47 AM   TRIG 100 07/05/2019 10:47 AM   HDL 30 (L) 07/05/2019 10:47 AM   CHOLHDL 4.7 07/05/2019 10:47 AM   LDLCALC 92 07/05/2019 10:47 AM    Wt Readings from Last 3 Encounters:  09/17/19 150 lb (68 kg)  08/09/19 196 lb (88.9 kg)  07/09/19 196 lb 13.9 oz (89.3 kg)     Objective:    Vital Signs:  BP (!) 134/91   Pulse 68   Resp 18   Wt 150 lb (68 kg)   BMI 20.34 kg/m    Normal affect. Normal speech pattern and tone. Comfortable, no apparent distress. No audible signs of sob or wheezing.   ASSESSMENT & PLAN:    1. PAF -no symptoms, continue current meds including anticoag  2. Chronic systolic HF - mild LV systolic dysfunction during 06/2019 admission, perhaps tachy mediated - repeat echo early May to reassess LVEF now that he has been rate controled  3. HTN - reasonable control, continue current meds  4. Hyperlipidemia -continue statin  5. Hypokalemia - repeat BMET/Mg  COVID-19 Education: The signs and symptoms of COVID-19 were discussed with the patient and how to seek care for testing (follow up with PCP or arrange E-visit).  The importance of social distancing was discussed today.  Time:   Today, I have spent 22 minutes with the patient with telehealth technology discussing the above problems.     Medication Adjustments/Labs and Tests Ordered: Current medicines are reviewed at length with the patient today.  Concerns regarding medicines are outlined above.   Tests Ordered: No orders of the defined types were placed in this encounter.   Medication Changes: No orders of  the defined types were placed in this encounter.   Follow Up:  Either In Person or Virtual in 4 month(s)  Signed, Carlyle Dolly, MD  09/17/2019 10:58 AM              Arnoldo Lenis, M.D.

## 2019-09-17 NOTE — Addendum Note (Signed)
Addended byAbelino Derrick R on: 09/17/2019 11:07 AM   Modules accepted: Orders

## 2019-10-11 ENCOUNTER — Other Ambulatory Visit: Payer: Self-pay | Admitting: Cardiology

## 2019-10-11 ENCOUNTER — Ambulatory Visit (HOSPITAL_COMMUNITY)
Admission: RE | Admit: 2019-10-11 | Discharge: 2019-10-11 | Disposition: A | Discharge status: Home / Self Care | Payer: Medicare Other | Source: Ambulatory Visit | Attending: Cardiology | Admitting: Cardiology

## 2019-10-11 ENCOUNTER — Ambulatory Visit (INDEPENDENT_AMBULATORY_CARE_PROVIDER_SITE_OTHER): Payer: Medicare Other | Admitting: *Deleted

## 2019-10-11 ENCOUNTER — Other Ambulatory Visit: Payer: Self-pay

## 2019-10-11 DIAGNOSIS — I5022 Chronic systolic (congestive) heart failure: Secondary | ICD-10-CM | POA: Diagnosis not present

## 2019-10-11 DIAGNOSIS — I10 Essential (primary) hypertension: Secondary | ICD-10-CM | POA: Diagnosis not present

## 2019-10-11 NOTE — Progress Notes (Signed)
Pt in for EKG. States that he feels fine today. Has not taken his meds because he has not had anything to eat. Pt will come in for nurse visit one day this week after taking meds.

## 2019-10-11 NOTE — Progress Notes (Signed)
*  PRELIMINARY RESULTS* Echocardiogram 2D echo limited has been performed.  Warren Mcdonald 10/11/2019, 9:25 AM

## 2019-10-15 ENCOUNTER — Other Ambulatory Visit: Payer: Self-pay

## 2019-10-15 ENCOUNTER — Ambulatory Visit (INDEPENDENT_AMBULATORY_CARE_PROVIDER_SITE_OTHER): Payer: Medicare Other

## 2019-10-15 VITALS — BP 140/84 | HR 112

## 2019-10-15 DIAGNOSIS — Z0130 Encounter for examination of blood pressure without abnormal findings: Secondary | ICD-10-CM | POA: Diagnosis not present

## 2019-10-15 NOTE — Progress Notes (Signed)
Pt came in for BP check.He did take his bp meds this am   BP today was 140/84, HR 112     I will FYI Dr Wyline Mood

## 2019-10-19 ENCOUNTER — Telehealth: Payer: Self-pay | Admitting: *Deleted

## 2019-10-19 NOTE — Telephone Encounter (Signed)
-----   Message from Antoine Poche, MD sent at 10/19/2019 10:12 AM EDT ----- Echo shows heart function remaisn decreased, heart rates remain elevated by recent EKG. Increase toprol to 150mg  daily, needs PA f/u in [employer] 3 weeks  MD

## 2019-10-19 NOTE — Telephone Encounter (Signed)
Pt family voiced understanding - updated medication list - scheduled f/u 6/16 with Randall An, PA - routed to pcp

## 2019-10-22 ENCOUNTER — Telehealth: Payer: Self-pay | Admitting: Cardiology

## 2019-10-22 MED ORDER — METOPROLOL SUCCINATE ER 100 MG PO TB24: 150.0000 mg | ORAL_TABLET | Freq: Every day | ORAL | 6 refills | Status: DC

## 2019-10-22 NOTE — Telephone Encounter (Signed)
Daughter advised that rx sent and also advised that patient needed to fill out a new DPR form giving [place] permission to speak with her.

## 2019-10-22 NOTE — Telephone Encounter (Signed)
Daughter -[name] called in regards to medication metoprolol succinate (TOPROL-XL) 100 MG 24 hr tablet. States that patient is out of medication . Was he suppose to increase the dosage?

## 2019-11-05 ENCOUNTER — Telehealth: Payer: Self-pay | Admitting: *Deleted

## 2019-11-05 NOTE — Telephone Encounter (Signed)
Returned call to [place], patient's granddaughter.  She states that [facility] had instructed her to call and refer patient for PT.  The patient had last used [facility].  I called and left a message to Tiffany at [facility] voicemail.

## 2019-11-06 DIAGNOSIS — E1151 Type 2 diabetes mellitus with diabetic peripheral angiopathy without gangrene: Secondary | ICD-10-CM | POA: Diagnosis not present

## 2019-11-06 DIAGNOSIS — I110 Hypertensive heart disease with heart failure: Secondary | ICD-10-CM | POA: Diagnosis not present

## 2019-11-06 DIAGNOSIS — Z44122 Encounter for fitting and adjustment of partial artificial left leg: Secondary | ICD-10-CM | POA: Diagnosis not present

## 2019-11-06 DIAGNOSIS — Z89612 Acquired absence of left leg above knee: Secondary | ICD-10-CM | POA: Diagnosis not present

## 2019-11-06 DIAGNOSIS — Z7984 Long term (current) use of oral hypoglycemic drugs: Secondary | ICD-10-CM | POA: Diagnosis not present

## 2019-11-06 DIAGNOSIS — I5032 Chronic diastolic (congestive) heart failure: Secondary | ICD-10-CM | POA: Diagnosis not present

## 2019-11-06 DIAGNOSIS — Z4781 Encounter for orthopedic aftercare following surgical amputation: Secondary | ICD-10-CM | POA: Diagnosis not present

## 2019-11-06 DIAGNOSIS — Z7901 Long term (current) use of anticoagulants: Secondary | ICD-10-CM | POA: Diagnosis not present

## 2019-11-06 DIAGNOSIS — I480 Paroxysmal atrial fibrillation: Secondary | ICD-10-CM | POA: Diagnosis not present

## 2019-11-08 DIAGNOSIS — Z44122 Encounter for fitting and adjustment of partial artificial left leg: Secondary | ICD-10-CM | POA: Diagnosis not present

## 2019-11-08 DIAGNOSIS — I5032 Chronic diastolic (congestive) heart failure: Secondary | ICD-10-CM | POA: Diagnosis not present

## 2019-11-08 DIAGNOSIS — E1151 Type 2 diabetes mellitus with diabetic peripheral angiopathy without gangrene: Secondary | ICD-10-CM | POA: Diagnosis not present

## 2019-11-08 DIAGNOSIS — Z4781 Encounter for orthopedic aftercare following surgical amputation: Secondary | ICD-10-CM | POA: Diagnosis not present

## 2019-11-08 DIAGNOSIS — I110 Hypertensive heart disease with heart failure: Secondary | ICD-10-CM | POA: Diagnosis not present

## 2019-11-08 DIAGNOSIS — Z89612 Acquired absence of left leg above knee: Secondary | ICD-10-CM | POA: Diagnosis not present

## 2019-11-10 ENCOUNTER — Ambulatory Visit (INDEPENDENT_AMBULATORY_CARE_PROVIDER_SITE_OTHER): Payer: Medicare Other | Admitting: Student

## 2019-11-10 ENCOUNTER — Encounter: Payer: Self-pay | Admitting: Student

## 2019-11-10 ENCOUNTER — Other Ambulatory Visit: Payer: Self-pay

## 2019-11-10 VITALS — BP 140/70 | HR 90 | Ht 73.0 in

## 2019-11-10 DIAGNOSIS — E785 Hyperlipidemia, unspecified: Secondary | ICD-10-CM

## 2019-11-10 DIAGNOSIS — I4819 Other persistent atrial fibrillation: Secondary | ICD-10-CM | POA: Diagnosis not present

## 2019-11-10 DIAGNOSIS — I779 Disorder of arteries and arterioles, unspecified: Secondary | ICD-10-CM | POA: Diagnosis not present

## 2019-11-10 DIAGNOSIS — I10 Essential (primary) hypertension: Secondary | ICD-10-CM

## 2019-11-10 DIAGNOSIS — I428 Other cardiomyopathies: Secondary | ICD-10-CM

## 2019-11-10 DIAGNOSIS — I739 Peripheral vascular disease, unspecified: Secondary | ICD-10-CM

## 2019-11-10 MED ORDER — METOPROLOL SUCCINATE ER 200 MG PO TB24: 200.0000 mg | ORAL_TABLET | Freq: Every day | ORAL | 3 refills | Status: DC

## 2019-11-10 NOTE — Progress Notes (Signed)
Cardiology Office Note    Date:  11/10/2019   ID:  Warren Mcdonald, DOB [birth year], MRN [id number]  PCP:  Patient, No Pcp Per  Cardiologist: Dina Rich, MD    Chief Complaint  Patient presents with  . Follow-up    2 month visit    History of Present Illness:    Warren Mcdonald is a 86 y.o. male with past medical history of paroxsymal atrial fibrillation (on Eliquis), chronic systolic CHF (EF 50-56% by echo in 06/2019), HTN, HLD, Type 2 DM and PAD (s/p left AKA in 06/2019) who presents to the office today for 4-month follow-up.   He most recently had a phone visit with Dr. Wyline Mood on 09/17/2019 and denied any palpitations, chest pain or dyspnea at that time. He was continued on Eliquis 5mg  BID, Cardizem CD 180mg  daily and Toprol-XL 100mg  daily and asked to come to the office in several weeks for an EKG. Rates remained elevated and a follow-up limited echocardiogram was recommended   This was performed on 10/11/2019 and showed that his EF remained reduced at 40 to 45%.  Given that his cardiomyopathy was felt to possibly be tachycardia-mediated, Toprol-XL was increased to 150 mg daily with follow-up recommended in 3 weeks.  In talking with the patient and his spouse today, he reports overall doing well from a cardiac perspective since his last visit. He is unaware of his arrhythmia and denies any palpitations. In reviewing his medication list, he reports having not taken Cardizem CD over the past few months due to no refills on the medication. He is mostly wheelchair-bound given his recent left AKA but denies any specific chest pain or exertional dyspnea. No recent orthopnea, PND or edema.  He reports good compliance with Eliquis and denies any evidence of active bleeding.   Past Medical History:  Diagnosis Date  . Abdominal distension (gaseous)   . Anemia, unspecified   . Chronic diastolic (congestive) heart failure (HCC)   . Essential (primary) hypertension   . Hyperlipidemia    . PAF (paroxysmal atrial fibrillation) (HCC)   . Peripheral vascular disease (HCC) 2011   Left above-knee popliteal to posterior tibial artery bypass   . PUD (peptic ulcer disease)   . Type 2 diabetes mellitus with diabetic peripheral angiopathy without gangrene [facility])     Past Surgical History:  Procedure Laterality Date  . ABDOMINAL AORTOGRAM N/A 05/24/2019   Procedure: ABDOMINAL AORTOGRAM;  Surgeon: 2012, MD;  Location: [facility] INVASIVE CV LAB;  Service: Cardiovascular;  Laterality: N/A;  . AMPUTATION Left 06/16/2019   Procedure: AMPUTATION DIGIT FOURTH TOE LEFT FOOT;  Surgeon: Maeola Harman, MD;  Location: [facility] OR;  Service: Vascular;  Laterality: Left;  . AMPUTATION Left 06/26/2019   Procedure: AMPUTATION two DIGITs, left toe;  Surgeon: 06/18/2019, MD;  Location: [facility] OR;  Service: Vascular;  Laterality: Left;  . AMPUTATION Left 07/02/2019   Procedure: left AMPUTATION ABOVE KNEE;  Surgeon: Maeola Harman, MD;  Location: [facility] OR;  Service: Vascular;  Laterality: Left;  . APPENDECTOMY    . CATARACT EXTRACTION W/ INTRAOCULAR LENS  IMPLANT, BILATERAL    . CHOLECYSTECTOMY N/A 09/08/2014   Procedure: CHOLECYSTECTOMY;  Surgeon: 08/30/2019 Md, MD;  Location: [facility] ORS;  Service: General;  Laterality: N/A;  . EYE SURGERY    . I & D EXTREMITY Left 06/30/2019   Procedure: sharp incisonal DEBRIDEMENT of left foot including bone, tendon, muscle, and skin;  Surgeon: 09/10/2014, MD;  Location:  [facility] OR;  Service: Vascular;  Laterality: Left;  . LOWER EXTREMITY ANGIOGRAPHY Left 05/24/2019   Procedure: Lower Extremity Angiography;  Surgeon: Maeola Harman, MD;  Location: [facility] INVASIVE CV LAB;  Service: Cardiovascular;  Laterality: Left;  . PERIPHERAL VASCULAR BALLOON ANGIOPLASTY Left 05/24/2019   Procedure: PERIPHERAL VASCULAR BALLOON ANGIOPLASTY;  Surgeon: Maeola Harman, MD;  Location: [facility] INVASIVE CV LAB;  Service: Cardiovascular;  Laterality: Left;   tibial bypass  . PR VEIN BYPASS GRAFT,AORTO-FEM-POP     Left above knee popliteal to posterior tibial artery bypass graft    Current Medications: Outpatient Medications Prior to Visit  Medication Sig Dispense Refill  . apixaban (ELIQUIS) 5 MG TABS tablet Take 1 tablet (5 mg total) by mouth 2 (two) times daily. 60 tablet 0  . atorvastatin (LIPITOR) 20 MG tablet Take 1 tablet (20 mg total) by mouth daily at 6 PM. 30 tablet 3  . bisacodyl (DULCOLAX) 5 MG EC tablet Take 5 mg by mouth daily as needed for moderate constipation.    . cetirizine (ZYRTEC) 10 MG tablet Take 10 mg by mouth at bedtime.    Marland Kitchen HYDROcodone-acetaminophen (NORCO/VICODIN) 5-325 MG tablet Take 1 tablet by mouth every 4 (four) hours as needed for moderate pain. 10 tablet 0  . lubiprostone (AMITIZA) 24 MCG capsule Take 24 mcg by mouth 2 (two) times daily with a meal.    . metFORMIN (GLUCOPHAGE) 500 MG tablet Take 500 mg by mouth 2 (two) times daily with a meal.      . potassium chloride SA (KLOR-CON M20) 20 MEQ tablet 07/26/19 STARTING Today and Tomorrow, take 40 meq (2 tabs), then take 20 meq (1 tablet) daily thereafter 90 tablet 3  . traMADol (ULTRAM) 50 MG tablet Take 50 mg by mouth every 6 (six) hours as needed.    . diltiazem (CARDIZEM CD) 180 MG 24 hr capsule Take 1 capsule (180 mg total) by mouth daily. 30 capsule 0  . metoprolol succinate (TOPROL-XL) 100 MG 24 hr tablet Take 1.5 tablets (150 mg total) by mouth daily. Take with or immediately following a meal. 135 tablet 6  . latanoprost (XALATAN) 0.005 % ophthalmic solution 1 drop at bedtime.     No facility-administered medications prior to visit.     Allergies:   Patient has no known allergies.   Social History   Socioeconomic History  . Marital status: Married    Spouse name: Not on file  . Number of children: Not on file  . Years of education: Not on file  . Highest education level: Not on file  Occupational History  . Not on file  Tobacco Use  . Smoking  status: Former Smoker    Types: Cigars    Quit date: 05/27/1988    Years since quitting: 31.4  . Smokeless tobacco: Former Neurosurgeon    Types: Chew    Quit date: 05/27/1988  Vaping Use  . Vaping Use: Never used  Substance and Sexual Activity  . Alcohol use: No    Alcohol/week: 0.0 standard drinks  . Drug use: No  . Sexual activity: Not on file  Other Topics Concern  . Not on file  Social History Narrative  . Not on file   Social Determinants of Health   Financial Resource Strain:   . Difficulty of Paying Living Expenses:   Food Insecurity:   . Worried About Programme researcher, broadcasting/film/video in the Last Year:   . [employer] of Food in the Last Year:  Transportation Needs:   . Freight forwarder (Medical):   Marland Kitchen Lack of Transportation (Non-Medical):   Physical Activity:   . Days of Exercise per Week:   . Minutes of Exercise per Session:   Stress:   . Feeling of Stress :   Social Connections:   . Frequency of Communication with Friends and Family:   . Frequency of Social Gatherings with Friends and Family:   . Attends Religious Services:   . Active Member of Clubs or Organizations:   . Attends Banker Meetings:   Marland Kitchen Marital Status:      Family History:  The patient's family history includes Cancer in his brother, daughter, and sister; Deep vein thrombosis in his sister; Diabetes in his daughter, sister, and another family member; Heart attack in his father and mother; Heart disease in his father and mother; Hypertension in his mother; Prostate cancer in an other family member.   Review of Systems:   Please see the history of present illness.     General:  No chills, fever, night sweats or weight changes. Positive for fatigue.  Cardiovascular:  No chest pain, dyspnea on exertion, edema, orthopnea, palpitations, paroxysmal nocturnal dyspnea. Dermatological: No rash, lesions/masses Respiratory: No cough, dyspnea Urologic: No hematuria, dysuria Abdominal:   No nausea, vomiting,  diarrhea, bright red blood per rectum, melena, or hematemesis Neurologic:  No visual changes, wkns, changes in mental status. All other systems reviewed and are otherwise negative except as noted above.   Physical Exam:    VS:  BP 140/70   Pulse 90   Ht 6\' 1"  (1.854 m)   SpO2 96%   BMI 19.79 kg/m    General: Well developed, well nourished,male appearing in no acute distress. Head: Normocephalic, atraumatic, sclera non-icteric.  Neck: No carotid bruits. JVD not elevated.  Lungs: Respirations regular and unlabored, without wheezes or rales.  Heart: Irregularly irregular. No S3 or S4.  No murmur, no rubs, or gallops appreciated. Abdomen: Soft, non-tender, non-distended. No obvious abdominal masses. Msk:  Strength and tone appear normal for age. No obvious joint deformities or effusions. Extremities: No clubbing or cyanosis. No lower extremity edema.  Left AKA.  Neuro: Alert and oriented X 3. Moves all extremities spontaneously. No focal deficits noted. Psych:  Responds to questions appropriately with a normal affect. Skin: No rashes or lesions noted  Wt Readings from Last 3 Encounters:  09/17/19 150 lb (68 kg)  08/09/19 196 lb (88.9 kg)  07/09/19 196 lb 13.9 oz (89.3 kg)     Studies/Labs Reviewed:   EKG:  EKG is not ordered today.    Recent Labs: 07/05/2019: Magnesium 2.0; TSH 2.795 07/26/2019: BUN 17; Creatinine, Ser 1.13; Hemoglobin 11.5; Platelets 213; Potassium 3.2; Sodium 141   Lipid Panel    Component Value Date/Time   CHOL 142 07/05/2019 1047   TRIG 100 07/05/2019 1047   HDL 30 (L) 07/05/2019 1047   CHOLHDL 4.7 07/05/2019 1047   VLDL 20 07/05/2019 1047   LDLCALC 92 07/05/2019 1047    Additional studies/ records that were reviewed today include:   Echocardiogram: 06/2019 IMPRESSIONS    1. Left ventricular ejection fraction, by visual estimation, is 40 to  45%. The left ventricle has moderately decreased function. There is mildly  increased left ventricular  hypertrophy.  2. Left ventricular diastolic function could not be evaluated.  3. Mildly dilated left ventricular internal cavity size.  4. The left ventricle demonstrates global hypokinesis.  5. Global right ventricle has mildly reduced  systolic function.The right  ventricular size is normal. No increase in right ventricular wall  thickness.  6. Left atrial size was moderately dilated.  7. Right atrial size was normal.  8. The mitral valve is abnormal. Mild to moderate mitral valve  regurgitation.  9. The tricuspid valve is grossly normal.  10. The tricuspid valve is grossly normal. Tricuspid valve regurgitation  is moderate.  11. The aortic valve is tricuspid. Aortic valve regurgitation is not  visualized. Mild aortic valve sclerosis without stenosis.  12. The pulmonic valve was grossly normal. Pulmonic valve regurgitation is  not visualized.   Limited Echocardiogram: 09/2019 IMPRESSIONS    1. Left ventricular ejection fraction, by estimation, is 40 to 45%. The  left ventricle has mildly decreased function. LVEF evaluation may be  affected by afib with elevated rates during study  2. Limited echo to evaluate LV function.   FINDINGS  Left Ventricle: Left ventricular ejection fraction, by estimation, is 40  to 45%. The left ventricle has mildly decreased function.   Assessment:    1. Persistent atrial fibrillation (HCC)   2. NICM (nonischemic cardiomyopathy) (HCC)   3. Essential hypertension   4. Hyperlipidemia LDL goal <70   5. PAD (peripheral artery disease) (HCC)      Plan:   In order of problems listed above:  1. Persistent Atrial Fibrillation - He remains in rate-controlled atrial fibrillation on examination today. He is listed as being on Cardizem CD 180mg  daily and Toprol-XL 150mg  daily but the patient and his wife confirm he has not taken Cardizem CD in over a month as they thought it was a temporary medication. Given his rates are in the 90's today  and in the setting of his reduced EF, will try to titrate Toprol-XL further to 200mg  daily and avoid restarting Cardizem unless rates become elevated. Will request a BP cuff as outlined below.  - No evidence of active bleeding. Continue Eliquis 5mg  BID for anticoagulation.   2. NICM - Most recent echocardiogram showed his EF remained reduced at 40 to 45% and his cardiomyopathy was felt to possibly be tachycardia-mediated in the setting of uncontrolled atrial fibrillation. - Rates are well-controlled in the 90's during today's visit. Given he is not taking Cardizem CD, will titrate Toprol-XL from 150mg  to 200mg  daily. I will message Social Work today to see if a BP cuff can be sent to the patient's home as his wife is a CNA and would be able to check this. If BP allows, would consider the addition or an ARB or Spironolactone at his next visit.  3. HTN - BP is slightly elevated at 140/70 during today's visit. Will titrate Toprol-XL to 200mg  daily as outlined above.   4. HLD - FLP in 06/2019 showed LDL was elevated at 92. Remains on Atorvastatin 20 mg daily with goal LDL less than 70 in the setting of known PAD. Will need to obtain repeat FLP and LFT's at the time of his next visit if he has not re-established with a PCP (previously followed by Dr. ).   5. PAD - He is s/p multiple interventions to his left lower extremity and underwent left AKA in 06/2019. Followed by Vascular Surgery.   Medication Adjustments/Labs and Tests Ordered: Current medicines are reviewed at length with the patient today.  Concerns regarding medicines are outlined above.  Medication changes, Labs and Tests ordered today are listed in the Patient Instructions below. Patient Instructions  Medication Instructions:   Increase Toprol-XL to 200 mg  daily.   Labwork:  None  Testing/Procedures:  None  Follow-Up:  Keep scheduled follow-up Dr. Harl Bowie on 01/17/2020.  Any Other Special Instructions Will Be Listed  Below (If Applicable).     If you need a refill on your cardiac medications before your next appointment, please call your pharmacy.      Signed, Erma Heritage, PA-C  11/10/2019 7:28 PM    [place] [address] [place], [place] [address] Phone: [phone] Fax: [phone]

## 2019-11-10 NOTE — Patient Instructions (Signed)
Medication Instructions:   Increase Toprol-XL to 200 mg daily.   Labwork:  None  Testing/Procedures:  None  Follow-Up:  Keep scheduled follow-up Dr. Wyline Mood on 01/17/2020.  Any Other Special Instructions Will Be Listed Below (If Applicable).     If you need a refill on your cardiac medications before your next appointment, please call your pharmacy.

## 2019-11-12 ENCOUNTER — Telehealth (HOSPITAL_COMMUNITY): Payer: Self-pay | Admitting: Licensed Clinical Social Worker

## 2019-11-12 DIAGNOSIS — Z89612 Acquired absence of left leg above knee: Secondary | ICD-10-CM | POA: Diagnosis not present

## 2019-11-12 DIAGNOSIS — Z4781 Encounter for orthopedic aftercare following surgical amputation: Secondary | ICD-10-CM | POA: Diagnosis not present

## 2019-11-12 DIAGNOSIS — E1151 Type 2 diabetes mellitus with diabetic peripheral angiopathy without gangrene: Secondary | ICD-10-CM | POA: Diagnosis not present

## 2019-11-12 DIAGNOSIS — I110 Hypertensive heart disease with heart failure: Secondary | ICD-10-CM | POA: Diagnosis not present

## 2019-11-12 DIAGNOSIS — I5032 Chronic diastolic (congestive) heart failure: Secondary | ICD-10-CM | POA: Diagnosis not present

## 2019-11-12 DIAGNOSIS — Z44122 Encounter for fitting and adjustment of partial artificial left leg: Secondary | ICD-10-CM | POA: Diagnosis not present

## 2019-11-12 NOTE — Telephone Encounter (Signed)
CSW referred to assist patient with obtaining a BP cuff. CSW contacted patient to inform cuff will be delivered to home. Message left. CSW available as needed. Jackie Capitola Ladson, LCSW, CCSW-MCS [phone]  

## 2019-11-18 DIAGNOSIS — Z44122 Encounter for fitting and adjustment of partial artificial left leg: Secondary | ICD-10-CM | POA: Diagnosis not present

## 2019-11-18 DIAGNOSIS — Z4781 Encounter for orthopedic aftercare following surgical amputation: Secondary | ICD-10-CM | POA: Diagnosis not present

## 2019-11-18 DIAGNOSIS — I5032 Chronic diastolic (congestive) heart failure: Secondary | ICD-10-CM | POA: Diagnosis not present

## 2019-11-18 DIAGNOSIS — Z89612 Acquired absence of left leg above knee: Secondary | ICD-10-CM | POA: Diagnosis not present

## 2019-11-18 DIAGNOSIS — E1151 Type 2 diabetes mellitus with diabetic peripheral angiopathy without gangrene: Secondary | ICD-10-CM | POA: Diagnosis not present

## 2019-11-18 DIAGNOSIS — I110 Hypertensive heart disease with heart failure: Secondary | ICD-10-CM | POA: Diagnosis not present

## 2019-11-21 ENCOUNTER — Other Ambulatory Visit: Payer: Self-pay | Admitting: Family Medicine

## 2019-11-24 DIAGNOSIS — Z4781 Encounter for orthopedic aftercare following surgical amputation: Secondary | ICD-10-CM | POA: Diagnosis not present

## 2019-11-24 DIAGNOSIS — I5032 Chronic diastolic (congestive) heart failure: Secondary | ICD-10-CM | POA: Diagnosis not present

## 2019-11-24 DIAGNOSIS — E1151 Type 2 diabetes mellitus with diabetic peripheral angiopathy without gangrene: Secondary | ICD-10-CM | POA: Diagnosis not present

## 2019-11-24 DIAGNOSIS — I110 Hypertensive heart disease with heart failure: Secondary | ICD-10-CM | POA: Diagnosis not present

## 2019-11-24 DIAGNOSIS — Z89612 Acquired absence of left leg above knee: Secondary | ICD-10-CM | POA: Diagnosis not present

## 2019-11-24 DIAGNOSIS — Z44122 Encounter for fitting and adjustment of partial artificial left leg: Secondary | ICD-10-CM | POA: Diagnosis not present

## 2019-12-01 DIAGNOSIS — I5032 Chronic diastolic (congestive) heart failure: Secondary | ICD-10-CM | POA: Diagnosis not present

## 2019-12-01 DIAGNOSIS — E1151 Type 2 diabetes mellitus with diabetic peripheral angiopathy without gangrene: Secondary | ICD-10-CM | POA: Diagnosis not present

## 2019-12-01 DIAGNOSIS — Z89612 Acquired absence of left leg above knee: Secondary | ICD-10-CM | POA: Diagnosis not present

## 2019-12-01 DIAGNOSIS — Z4781 Encounter for orthopedic aftercare following surgical amputation: Secondary | ICD-10-CM | POA: Diagnosis not present

## 2019-12-01 DIAGNOSIS — I110 Hypertensive heart disease with heart failure: Secondary | ICD-10-CM | POA: Diagnosis not present

## 2019-12-01 DIAGNOSIS — Z44122 Encounter for fitting and adjustment of partial artificial left leg: Secondary | ICD-10-CM | POA: Diagnosis not present

## 2019-12-03 DIAGNOSIS — Z4781 Encounter for orthopedic aftercare following surgical amputation: Secondary | ICD-10-CM | POA: Diagnosis not present

## 2019-12-03 DIAGNOSIS — I110 Hypertensive heart disease with heart failure: Secondary | ICD-10-CM | POA: Diagnosis not present

## 2019-12-03 DIAGNOSIS — Z89612 Acquired absence of left leg above knee: Secondary | ICD-10-CM | POA: Diagnosis not present

## 2019-12-03 DIAGNOSIS — Z44122 Encounter for fitting and adjustment of partial artificial left leg: Secondary | ICD-10-CM | POA: Diagnosis not present

## 2019-12-03 DIAGNOSIS — I5032 Chronic diastolic (congestive) heart failure: Secondary | ICD-10-CM | POA: Diagnosis not present

## 2019-12-03 DIAGNOSIS — E1151 Type 2 diabetes mellitus with diabetic peripheral angiopathy without gangrene: Secondary | ICD-10-CM | POA: Diagnosis not present

## 2019-12-06 DIAGNOSIS — Z7901 Long term (current) use of anticoagulants: Secondary | ICD-10-CM | POA: Diagnosis not present

## 2019-12-06 DIAGNOSIS — I5032 Chronic diastolic (congestive) heart failure: Secondary | ICD-10-CM | POA: Diagnosis not present

## 2019-12-06 DIAGNOSIS — Z89612 Acquired absence of left leg above knee: Secondary | ICD-10-CM | POA: Diagnosis not present

## 2019-12-06 DIAGNOSIS — Z44122 Encounter for fitting and adjustment of partial artificial left leg: Secondary | ICD-10-CM | POA: Diagnosis not present

## 2019-12-06 DIAGNOSIS — Z7984 Long term (current) use of oral hypoglycemic drugs: Secondary | ICD-10-CM | POA: Diagnosis not present

## 2019-12-06 DIAGNOSIS — Z4781 Encounter for orthopedic aftercare following surgical amputation: Secondary | ICD-10-CM | POA: Diagnosis not present

## 2019-12-06 DIAGNOSIS — E1151 Type 2 diabetes mellitus with diabetic peripheral angiopathy without gangrene: Secondary | ICD-10-CM | POA: Diagnosis not present

## 2019-12-06 DIAGNOSIS — I110 Hypertensive heart disease with heart failure: Secondary | ICD-10-CM | POA: Diagnosis not present

## 2019-12-06 DIAGNOSIS — I480 Paroxysmal atrial fibrillation: Secondary | ICD-10-CM | POA: Diagnosis not present

## 2019-12-07 DIAGNOSIS — I110 Hypertensive heart disease with heart failure: Secondary | ICD-10-CM | POA: Diagnosis not present

## 2019-12-07 DIAGNOSIS — Z44122 Encounter for fitting and adjustment of partial artificial left leg: Secondary | ICD-10-CM | POA: Diagnosis not present

## 2019-12-07 DIAGNOSIS — Z89612 Acquired absence of left leg above knee: Secondary | ICD-10-CM | POA: Diagnosis not present

## 2019-12-07 DIAGNOSIS — Z4781 Encounter for orthopedic aftercare following surgical amputation: Secondary | ICD-10-CM | POA: Diagnosis not present

## 2019-12-07 DIAGNOSIS — I5032 Chronic diastolic (congestive) heart failure: Secondary | ICD-10-CM | POA: Diagnosis not present

## 2019-12-07 DIAGNOSIS — E1151 Type 2 diabetes mellitus with diabetic peripheral angiopathy without gangrene: Secondary | ICD-10-CM | POA: Diagnosis not present

## 2019-12-09 DIAGNOSIS — I5032 Chronic diastolic (congestive) heart failure: Secondary | ICD-10-CM | POA: Diagnosis not present

## 2019-12-09 DIAGNOSIS — I110 Hypertensive heart disease with heart failure: Secondary | ICD-10-CM | POA: Diagnosis not present

## 2019-12-09 DIAGNOSIS — Z89612 Acquired absence of left leg above knee: Secondary | ICD-10-CM | POA: Diagnosis not present

## 2019-12-09 DIAGNOSIS — E1151 Type 2 diabetes mellitus with diabetic peripheral angiopathy without gangrene: Secondary | ICD-10-CM | POA: Diagnosis not present

## 2019-12-09 DIAGNOSIS — Z4781 Encounter for orthopedic aftercare following surgical amputation: Secondary | ICD-10-CM | POA: Diagnosis not present

## 2019-12-09 DIAGNOSIS — Z44122 Encounter for fitting and adjustment of partial artificial left leg: Secondary | ICD-10-CM | POA: Diagnosis not present

## 2019-12-15 DIAGNOSIS — Z89612 Acquired absence of left leg above knee: Secondary | ICD-10-CM | POA: Diagnosis not present

## 2019-12-15 DIAGNOSIS — I110 Hypertensive heart disease with heart failure: Secondary | ICD-10-CM | POA: Diagnosis not present

## 2019-12-15 DIAGNOSIS — Z4781 Encounter for orthopedic aftercare following surgical amputation: Secondary | ICD-10-CM | POA: Diagnosis not present

## 2019-12-15 DIAGNOSIS — I5032 Chronic diastolic (congestive) heart failure: Secondary | ICD-10-CM | POA: Diagnosis not present

## 2019-12-15 DIAGNOSIS — Z44122 Encounter for fitting and adjustment of partial artificial left leg: Secondary | ICD-10-CM | POA: Diagnosis not present

## 2019-12-15 DIAGNOSIS — E1151 Type 2 diabetes mellitus with diabetic peripheral angiopathy without gangrene: Secondary | ICD-10-CM | POA: Diagnosis not present

## 2019-12-25 ENCOUNTER — Other Ambulatory Visit: Payer: Self-pay | Admitting: Family Medicine

## 2019-12-28 DIAGNOSIS — E1151 Type 2 diabetes mellitus with diabetic peripheral angiopathy without gangrene: Secondary | ICD-10-CM | POA: Diagnosis not present

## 2019-12-28 DIAGNOSIS — Z4781 Encounter for orthopedic aftercare following surgical amputation: Secondary | ICD-10-CM | POA: Diagnosis not present

## 2019-12-28 DIAGNOSIS — I5032 Chronic diastolic (congestive) heart failure: Secondary | ICD-10-CM | POA: Diagnosis not present

## 2019-12-28 DIAGNOSIS — I110 Hypertensive heart disease with heart failure: Secondary | ICD-10-CM | POA: Diagnosis not present

## 2019-12-28 DIAGNOSIS — Z89612 Acquired absence of left leg above knee: Secondary | ICD-10-CM | POA: Diagnosis not present

## 2019-12-28 DIAGNOSIS — Z44122 Encounter for fitting and adjustment of partial artificial left leg: Secondary | ICD-10-CM | POA: Diagnosis not present

## 2019-12-31 DIAGNOSIS — Z4781 Encounter for orthopedic aftercare following surgical amputation: Secondary | ICD-10-CM | POA: Diagnosis not present

## 2019-12-31 DIAGNOSIS — Z44122 Encounter for fitting and adjustment of partial artificial left leg: Secondary | ICD-10-CM | POA: Diagnosis not present

## 2019-12-31 DIAGNOSIS — I110 Hypertensive heart disease with heart failure: Secondary | ICD-10-CM | POA: Diagnosis not present

## 2019-12-31 DIAGNOSIS — Z89612 Acquired absence of left leg above knee: Secondary | ICD-10-CM | POA: Diagnosis not present

## 2019-12-31 DIAGNOSIS — E1151 Type 2 diabetes mellitus with diabetic peripheral angiopathy without gangrene: Secondary | ICD-10-CM | POA: Diagnosis not present

## 2019-12-31 DIAGNOSIS — I5032 Chronic diastolic (congestive) heart failure: Secondary | ICD-10-CM | POA: Diagnosis not present

## 2020-01-05 DIAGNOSIS — Z89612 Acquired absence of left leg above knee: Secondary | ICD-10-CM | POA: Diagnosis not present

## 2020-01-05 DIAGNOSIS — I480 Paroxysmal atrial fibrillation: Secondary | ICD-10-CM | POA: Diagnosis not present

## 2020-01-05 DIAGNOSIS — Z7984 Long term (current) use of oral hypoglycemic drugs: Secondary | ICD-10-CM | POA: Diagnosis not present

## 2020-01-05 DIAGNOSIS — E1151 Type 2 diabetes mellitus with diabetic peripheral angiopathy without gangrene: Secondary | ICD-10-CM | POA: Diagnosis not present

## 2020-01-05 DIAGNOSIS — Z4781 Encounter for orthopedic aftercare following surgical amputation: Secondary | ICD-10-CM | POA: Diagnosis not present

## 2020-01-05 DIAGNOSIS — Z44122 Encounter for fitting and adjustment of partial artificial left leg: Secondary | ICD-10-CM | POA: Diagnosis not present

## 2020-01-05 DIAGNOSIS — I5032 Chronic diastolic (congestive) heart failure: Secondary | ICD-10-CM | POA: Diagnosis not present

## 2020-01-05 DIAGNOSIS — R262 Difficulty in walking, not elsewhere classified: Secondary | ICD-10-CM | POA: Diagnosis not present

## 2020-01-05 DIAGNOSIS — I110 Hypertensive heart disease with heart failure: Secondary | ICD-10-CM | POA: Diagnosis not present

## 2020-01-05 DIAGNOSIS — Z7901 Long term (current) use of anticoagulants: Secondary | ICD-10-CM | POA: Diagnosis not present

## 2020-01-07 DIAGNOSIS — I5032 Chronic diastolic (congestive) heart failure: Secondary | ICD-10-CM | POA: Diagnosis not present

## 2020-01-07 DIAGNOSIS — I110 Hypertensive heart disease with heart failure: Secondary | ICD-10-CM | POA: Diagnosis not present

## 2020-01-07 DIAGNOSIS — Z4781 Encounter for orthopedic aftercare following surgical amputation: Secondary | ICD-10-CM | POA: Diagnosis not present

## 2020-01-07 DIAGNOSIS — E1151 Type 2 diabetes mellitus with diabetic peripheral angiopathy without gangrene: Secondary | ICD-10-CM | POA: Diagnosis not present

## 2020-01-07 DIAGNOSIS — Z89612 Acquired absence of left leg above knee: Secondary | ICD-10-CM | POA: Diagnosis not present

## 2020-01-07 DIAGNOSIS — Z44122 Encounter for fitting and adjustment of partial artificial left leg: Secondary | ICD-10-CM | POA: Diagnosis not present

## 2020-01-11 DIAGNOSIS — Z4781 Encounter for orthopedic aftercare following surgical amputation: Secondary | ICD-10-CM | POA: Diagnosis not present

## 2020-01-11 DIAGNOSIS — E1151 Type 2 diabetes mellitus with diabetic peripheral angiopathy without gangrene: Secondary | ICD-10-CM | POA: Diagnosis not present

## 2020-01-11 DIAGNOSIS — I5032 Chronic diastolic (congestive) heart failure: Secondary | ICD-10-CM | POA: Diagnosis not present

## 2020-01-11 DIAGNOSIS — Z89612 Acquired absence of left leg above knee: Secondary | ICD-10-CM | POA: Diagnosis not present

## 2020-01-11 DIAGNOSIS — I110 Hypertensive heart disease with heart failure: Secondary | ICD-10-CM | POA: Diagnosis not present

## 2020-01-11 DIAGNOSIS — Z44122 Encounter for fitting and adjustment of partial artificial left leg: Secondary | ICD-10-CM | POA: Diagnosis not present

## 2020-01-13 DIAGNOSIS — I5032 Chronic diastolic (congestive) heart failure: Secondary | ICD-10-CM | POA: Diagnosis not present

## 2020-01-13 DIAGNOSIS — Z44122 Encounter for fitting and adjustment of partial artificial left leg: Secondary | ICD-10-CM | POA: Diagnosis not present

## 2020-01-13 DIAGNOSIS — E1151 Type 2 diabetes mellitus with diabetic peripheral angiopathy without gangrene: Secondary | ICD-10-CM | POA: Diagnosis not present

## 2020-01-13 DIAGNOSIS — Z4781 Encounter for orthopedic aftercare following surgical amputation: Secondary | ICD-10-CM | POA: Diagnosis not present

## 2020-01-13 DIAGNOSIS — Z89612 Acquired absence of left leg above knee: Secondary | ICD-10-CM | POA: Diagnosis not present

## 2020-01-13 DIAGNOSIS — I110 Hypertensive heart disease with heart failure: Secondary | ICD-10-CM | POA: Diagnosis not present

## 2020-01-17 ENCOUNTER — Other Ambulatory Visit: Payer: Self-pay

## 2020-01-17 ENCOUNTER — Encounter: Payer: Self-pay | Admitting: Cardiology

## 2020-01-17 ENCOUNTER — Ambulatory Visit (INDEPENDENT_AMBULATORY_CARE_PROVIDER_SITE_OTHER): Payer: Medicare Other | Admitting: Cardiology

## 2020-01-17 VITALS — BP 122/80 | HR 94 | Ht 73.0 in | Wt 201.0 lb

## 2020-01-17 DIAGNOSIS — E782 Mixed hyperlipidemia: Secondary | ICD-10-CM | POA: Diagnosis not present

## 2020-01-17 DIAGNOSIS — I779 Disorder of arteries and arterioles, unspecified: Secondary | ICD-10-CM

## 2020-01-17 DIAGNOSIS — Z79899 Other long term (current) drug therapy: Secondary | ICD-10-CM

## 2020-01-17 DIAGNOSIS — I4891 Unspecified atrial fibrillation: Secondary | ICD-10-CM | POA: Diagnosis not present

## 2020-01-17 DIAGNOSIS — I5022 Chronic systolic (congestive) heart failure: Secondary | ICD-10-CM | POA: Diagnosis not present

## 2020-01-17 DIAGNOSIS — I10 Essential (primary) hypertension: Secondary | ICD-10-CM | POA: Diagnosis not present

## 2020-01-17 MED ORDER — LOSARTAN POTASSIUM 25 MG PO TABS: 12.5000 mg | ORAL_TABLET | Freq: Every day | ORAL | 3 refills | Status: DC

## 2020-01-17 NOTE — Patient Instructions (Addendum)
Medication Instructions:  Your physician has recommended you make the following change in your medication:  Start Losartan 12.5 mg Daily   *If you need a refill on your cardiac medications before your next appointment, please call your pharmacy*   Lab Work: Your physician recommends that you return for lab work in: 2 Weeks ( 01/31/20) Fasting   If you have labs (blood work) drawn today and your tests are completely normal, you will receive your results only by: Marland Kitchen MyChart Message (if you have MyChart) OR . A paper copy in the mail If you have any lab test that is abnormal or we need to change your treatment, we will call you to review the results.   Testing/Procedures: NONE   Follow-Up: At [facility], you and your health needs are our priority.  As part of our continuing mission to provide you with exceptional heart care, we have created designated Provider Care Teams.  These Care Teams include your primary Cardiologist (physician) and Advanced Practice Providers (APPs -  Physician Assistants and Nurse Practitioners) who all work together to provide you with the care you need, when you need it.  We recommend signing up for the patient portal called "MyChart".  Sign up information is provided on this After Visit Summary.  MyChart is used to connect with patients for Virtual Visits (Telemedicine).  Patients are able to view lab/test results, encounter notes, upcoming appointments, etc.  Non-urgent messages can be sent to your provider as well.   To learn more about what you can do with MyChart, go to [email or web address].    Your next appointment:   3 month(s)  The format for your next appointment:   In Person  Provider:   Dina Rich, MD   Other Instructions Thank you for choosing [facility]!

## 2020-01-17 NOTE — Progress Notes (Signed)
Clinical Summary Warren Mcdonald is a 86 y.o.male seen today for follow up of the following medical problems.   1. PAF -long history of PAF  - no recent palpitations - compliant with meds    2. Chronic systolic HF -- echo during recent admit LVEF 40-45% - possibly tachy mediated. Cannot exclude possible CAD   09/2019 echo LVEF 40-45% - denies any SOB or DOE - compliant with meds  3. HTN -he is compliant with meds  4. PAD - followed by vascular - s/p left AKA 06/2019  5. DM2 -followed by pcp  6. Hyperlipidemia -compliant with statin   Looking to establish with new pcp. Had seen Dr Luan Pulling and then Dr Holly Bodily.    SH: has not covid vaccine.   Past Medical History:  Diagnosis Date  . Abdominal distension (gaseous)   . Anemia, unspecified   . Chronic diastolic (congestive) heart failure ([place])   . Essential (primary) hypertension   . Hyperlipidemia   . PAF (paroxysmal atrial fibrillation) ([place])   . Peripheral vascular disease ([place]) 2011   Left above-knee popliteal to posterior tibial artery bypass   . PUD (peptic ulcer disease)   . Type 2 diabetes mellitus with diabetic peripheral angiopathy without gangrene ([place])      No Known Allergies   Current Outpatient Medications  Medication Sig Dispense Refill  . apixaban (ELIQUIS) 5 MG TABS tablet Take 1 tablet (5 mg total) by mouth 2 (two) times daily. 60 tablet 0  . atorvastatin (LIPITOR) 20 MG tablet Take 1 tablet (20 mg total) by mouth daily at 6 PM. 30 tablet 3  . bisacodyl (DULCOLAX) 5 MG EC tablet Take 5 mg by mouth daily as needed for moderate constipation.    . cetirizine (ZYRTEC) 10 MG tablet Take 10 mg by mouth at bedtime.    Marland Kitchen HYDROcodone-acetaminophen (NORCO/VICODIN) 5-325 MG tablet Take 1 tablet by mouth every 4 (four) hours as needed for moderate pain. 10 tablet 0  . lubiprostone (AMITIZA) 24 MCG capsule Take 24 mcg by mouth 2 (two) times daily with a meal.    . metFORMIN (GLUCOPHAGE)  500 MG tablet Take 500 mg by mouth 2 (two) times daily with a meal.      . metoprolol succinate (TOPROL-XL) 200 MG 24 hr tablet Take 1 tablet (200 mg total) by mouth daily. Take with or immediately following a meal. 90 tablet 3  . potassium chloride SA (KLOR-CON M20) 20 MEQ tablet 07/26/19 STARTING Today and Tomorrow, take 40 meq (2 tabs), then take 20 meq (1 tablet) daily thereafter 90 tablet 3  . traMADol (ULTRAM) 50 MG tablet Take 50 mg by mouth every 6 (six) hours as needed.     No current facility-administered medications for this visit.     Past Surgical History:  Procedure Laterality Date  . ABDOMINAL AORTOGRAM N/A 05/24/2019   Procedure: ABDOMINAL AORTOGRAM;  Surgeon: Waynetta Sandy, MD;  Location: [facility];  Service: Cardiovascular;  Laterality: N/A;  . AMPUTATION Left 06/16/2019   Procedure: AMPUTATION DIGIT FOURTH TOE LEFT FOOT;  Surgeon: Serafina Mitchell, MD;  Location: [place];  Service: Vascular;  Laterality: Left;  . AMPUTATION Left 06/26/2019   Procedure: AMPUTATION two DIGITs, left toe;  Surgeon: Waynetta Sandy, MD;  Location: [place];  Service: Vascular;  Laterality: Left;  . AMPUTATION Left 07/02/2019   Procedure: left AMPUTATION ABOVE KNEE;  Surgeon: Rosetta Posner, MD;  Location: [place];  Service: Vascular;  Laterality:  Left;  . APPENDECTOMY    . CATARACT EXTRACTION W/ INTRAOCULAR LENS  IMPLANT, BILATERAL    . CHOLECYSTECTOMY N/A 09/08/2014   Procedure: CHOLECYSTECTOMY;  Surgeon: Aviva Signs Md, MD;  Location: [facility];  Service: General;  Laterality: N/A;  . EYE SURGERY    . I & D EXTREMITY Left 06/30/2019   Procedure: sharp incisonal DEBRIDEMENT of left foot including bone, tendon, muscle, and skin;  Surgeon: Marty Heck, MD;  Location: [place];  Service: Vascular;  Laterality: Left;  . LOWER EXTREMITY ANGIOGRAPHY Left 05/24/2019   Procedure: Lower Extremity Angiography;  Surgeon: Waynetta Sandy, MD;  Location: [facility];   Service: Cardiovascular;  Laterality: Left;  . PERIPHERAL VASCULAR BALLOON ANGIOPLASTY Left 05/24/2019   Procedure: PERIPHERAL VASCULAR BALLOON ANGIOPLASTY;  Surgeon: Waynetta Sandy, MD;  Location: [facility] [facility];  Service: Cardiovascular;  Laterality: Left;  tibial bypass  . PR VEIN BYPASS GRAFT,AORTO-FEM-POP     Left above knee popliteal to posterior tibial artery bypass graft     No Known Allergies    Family History  Problem Relation Age of Onset  . Heart disease Mother   . Hypertension Mother   . Heart attack Mother   . Heart attack Father   . Heart disease Father        Heart disease before age 105  . Cancer Sister   . Deep vein thrombosis Sister   . Diabetes Sister        Amputation-right leg  . Cancer Daughter   . Diabetes Daughter   . Cancer Brother   . Diabetes Other   . Prostate cancer Other      Social History Warren Mcdonald reports that he quit smoking about 31 years ago. His smoking use included cigars. He quit smokeless tobacco use about 31 years ago.  His smokeless tobacco use included chew. Warren Mcdonald reports no history of alcohol use.   Review of Systems CONSTITUTIONAL: No weight loss, fever, chills, weakness or fatigue.  HEENT: Eyes: No visual loss, blurred vision, double vision or yellow sclerae.No hearing loss, sneezing, congestion, runny nose or sore throat.  SKIN: No rash or itching.  CARDIOVASCULAR: per hpi RESPIRATORY: No shortness of breath, cough or sputum.  GASTROINTESTINAL: No anorexia, nausea, vomiting or diarrhea. No abdominal pain or blood.  GENITOURINARY: No burning on urination, no polyuria NEUROLOGICAL: No headache, dizziness, syncope, paralysis, ataxia, numbness or tingling in the extremities. No change in bowel or bladder control.  MUSCULOSKELETAL: No muscle, back pain, joint pain or stiffness.  LYMPHATICS: No enlarged nodes. No history of splenectomy.  PSYCHIATRIC: No history of depression or anxiety.  ENDOCRINOLOGIC:  No reports of sweating, cold or heat intolerance. No polyuria or polydipsia.  Marland Kitchen   Physical Examination Vitals:   01/17/20 0756  BP: 122/80  Pulse: 94  SpO2: 98%   Filed Weights   01/17/20 0756  Weight: 201 lb (91.2 kg)    Gen: resting comfortably, no acute distress HEENT: no scleral icterus, pupils equal round and reactive, no palptable cervical adenopathy,  CV: irreg, no m/r/g, no jvd Resp: Clear to auscultation bilaterally GI: abdomen is soft, non-tender, non-distended, normal bowel sounds, no hepatosplenomegaly MSK: extremities are warm, no edema.  Skin: warm, no rash Neuro:  no focal deficits Psych: appropriate affect   Diagnostic Studies  08/2014 echo Study Conclusions  - Left ventricle: The cavity size was normal. Wall thickness was increased in a pattern of mild LVH. Systolic function was normal. The estimated  ejection fraction was in the range of 55% to 60%. Wall motion was normal; there were no regional wall motion abnormalities. Findings consistent with left ventricular diastolic dysfunction, ungraded in the setting of atrial fibrillation. - Aortic valve: Trileaflet; mildly thickened, mildly calcified leaflets. There was trivial regurgitation. - Mitral valve: Calcified annulus. Loose portion of mitral chordal structure (possible from anteromedial papillary) noted. There was mild regurgitation. - Left atrium: The atrium was mildly dilated. - Right atrium: The atrium was mildly dilated. - Tricuspid valve: There was moderate regurgitation. - Pulmonary arteries: PA peak pressure: 41 mm Hg (S). - Pericardium, extracardiac: There was no pericardial effusion.  Impressions:  - Mild LVH with LVEF 55-60%, features consistent with diastolic dysfunction. Mild biatrial enlargement. 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 Sclerotic aortic valve with trivial regurgitation. Moderate  tricuspid regurgitation with mildly increased PASP 41 mmHg.   09/2019 echo IMPRESSIONS    1. Left ventricular ejection fraction, by estimation, is 40 to 45%. The  left ventricle has mildly decreased function. LVEF evaluation may be  affected by afib with elevated rates during study  2. Limited echo to evaluate LV function.   FINDINGS  Left Ventricle: Left ventricular ejection fraction, by estimation, is 40  to 45%. The left ventricle has mildly decreased function.    Assessment and Plan  1. PAF - denies any symptoms, continue current meds  2. Chronic systolic HF - mild LV systolic dysfunction during 06/2019 admission, perhaps tachy mediated - with only mild dysfunction and advanced age without symptoms have not pursued ischemic testing, continuing medical therapy - start losartan 12.75m daily.   3. HTN -at goal, contniue to monitor  4. Hyperlipidemia -repeat labs  F/u 3 months      JArnoldo Lenis M.D.

## 2020-01-18 DIAGNOSIS — E1151 Type 2 diabetes mellitus with diabetic peripheral angiopathy without gangrene: Secondary | ICD-10-CM | POA: Diagnosis not present

## 2020-01-18 DIAGNOSIS — Z89612 Acquired absence of left leg above knee: Secondary | ICD-10-CM | POA: Diagnosis not present

## 2020-01-18 DIAGNOSIS — Z44122 Encounter for fitting and adjustment of partial artificial left leg: Secondary | ICD-10-CM | POA: Diagnosis not present

## 2020-01-18 DIAGNOSIS — I5032 Chronic diastolic (congestive) heart failure: Secondary | ICD-10-CM | POA: Diagnosis not present

## 2020-01-18 DIAGNOSIS — Z4781 Encounter for orthopedic aftercare following surgical amputation: Secondary | ICD-10-CM | POA: Diagnosis not present

## 2020-01-18 DIAGNOSIS — I110 Hypertensive heart disease with heart failure: Secondary | ICD-10-CM | POA: Diagnosis not present

## 2020-01-25 DIAGNOSIS — I110 Hypertensive heart disease with heart failure: Secondary | ICD-10-CM | POA: Diagnosis not present

## 2020-01-25 DIAGNOSIS — Z44122 Encounter for fitting and adjustment of partial artificial left leg: Secondary | ICD-10-CM | POA: Diagnosis not present

## 2020-01-25 DIAGNOSIS — E1151 Type 2 diabetes mellitus with diabetic peripheral angiopathy without gangrene: Secondary | ICD-10-CM | POA: Diagnosis not present

## 2020-01-25 DIAGNOSIS — Z89612 Acquired absence of left leg above knee: Secondary | ICD-10-CM | POA: Diagnosis not present

## 2020-01-25 DIAGNOSIS — Z4781 Encounter for orthopedic aftercare following surgical amputation: Secondary | ICD-10-CM | POA: Diagnosis not present

## 2020-01-25 DIAGNOSIS — I5032 Chronic diastolic (congestive) heart failure: Secondary | ICD-10-CM | POA: Diagnosis not present

## 2020-01-28 DIAGNOSIS — Z44122 Encounter for fitting and adjustment of partial artificial left leg: Secondary | ICD-10-CM | POA: Diagnosis not present

## 2020-01-28 DIAGNOSIS — I5032 Chronic diastolic (congestive) heart failure: Secondary | ICD-10-CM | POA: Diagnosis not present

## 2020-01-28 DIAGNOSIS — Z89612 Acquired absence of left leg above knee: Secondary | ICD-10-CM | POA: Diagnosis not present

## 2020-01-28 DIAGNOSIS — I110 Hypertensive heart disease with heart failure: Secondary | ICD-10-CM | POA: Diagnosis not present

## 2020-01-28 DIAGNOSIS — Z4781 Encounter for orthopedic aftercare following surgical amputation: Secondary | ICD-10-CM | POA: Diagnosis not present

## 2020-01-28 DIAGNOSIS — E1151 Type 2 diabetes mellitus with diabetic peripheral angiopathy without gangrene: Secondary | ICD-10-CM | POA: Diagnosis not present

## 2020-02-04 DIAGNOSIS — Z44122 Encounter for fitting and adjustment of partial artificial left leg: Secondary | ICD-10-CM | POA: Diagnosis not present

## 2020-02-04 DIAGNOSIS — I110 Hypertensive heart disease with heart failure: Secondary | ICD-10-CM | POA: Diagnosis not present

## 2020-02-04 DIAGNOSIS — Z4781 Encounter for orthopedic aftercare following surgical amputation: Secondary | ICD-10-CM | POA: Diagnosis not present

## 2020-02-04 DIAGNOSIS — Z89612 Acquired absence of left leg above knee: Secondary | ICD-10-CM | POA: Diagnosis not present

## 2020-02-04 DIAGNOSIS — I5032 Chronic diastolic (congestive) heart failure: Secondary | ICD-10-CM | POA: Diagnosis not present

## 2020-02-04 DIAGNOSIS — Z7901 Long term (current) use of anticoagulants: Secondary | ICD-10-CM | POA: Diagnosis not present

## 2020-02-04 DIAGNOSIS — E1151 Type 2 diabetes mellitus with diabetic peripheral angiopathy without gangrene: Secondary | ICD-10-CM | POA: Diagnosis not present

## 2020-02-04 DIAGNOSIS — R262 Difficulty in walking, not elsewhere classified: Secondary | ICD-10-CM | POA: Diagnosis not present

## 2020-02-04 DIAGNOSIS — Z7984 Long term (current) use of oral hypoglycemic drugs: Secondary | ICD-10-CM | POA: Diagnosis not present

## 2020-02-04 DIAGNOSIS — I480 Paroxysmal atrial fibrillation: Secondary | ICD-10-CM | POA: Diagnosis not present

## 2020-02-06 ENCOUNTER — Other Ambulatory Visit: Payer: Self-pay | Admitting: Physician Assistant

## 2020-02-17 DIAGNOSIS — E1151 Type 2 diabetes mellitus with diabetic peripheral angiopathy without gangrene: Secondary | ICD-10-CM | POA: Diagnosis not present

## 2020-02-17 DIAGNOSIS — Z44122 Encounter for fitting and adjustment of partial artificial left leg: Secondary | ICD-10-CM | POA: Diagnosis not present

## 2020-02-17 DIAGNOSIS — I110 Hypertensive heart disease with heart failure: Secondary | ICD-10-CM | POA: Diagnosis not present

## 2020-02-17 DIAGNOSIS — Z89612 Acquired absence of left leg above knee: Secondary | ICD-10-CM | POA: Diagnosis not present

## 2020-02-17 DIAGNOSIS — I5032 Chronic diastolic (congestive) heart failure: Secondary | ICD-10-CM | POA: Diagnosis not present

## 2020-02-17 DIAGNOSIS — Z4781 Encounter for orthopedic aftercare following surgical amputation: Secondary | ICD-10-CM | POA: Diagnosis not present

## 2020-02-20 ENCOUNTER — Emergency Department (HOSPITAL_COMMUNITY): Payer: Medicare Other

## 2020-02-20 ENCOUNTER — Inpatient Hospital Stay (HOSPITAL_COMMUNITY)
Admission: EM | Admit: 2020-02-20 | Discharge: 2020-02-22 | DRG: 292 | Disposition: A | Discharge status: Home Health | Payer: Medicare Other | Attending: Internal Medicine | Admitting: Internal Medicine

## 2020-02-20 ENCOUNTER — Encounter (HOSPITAL_COMMUNITY): Payer: Self-pay | Admitting: Emergency Medicine

## 2020-02-20 ENCOUNTER — Other Ambulatory Visit: Payer: Self-pay

## 2020-02-20 DIAGNOSIS — Z9842 Cataract extraction status, left eye: Secondary | ICD-10-CM | POA: Diagnosis not present

## 2020-02-20 DIAGNOSIS — Z961 Presence of intraocular lens: Secondary | ICD-10-CM | POA: Diagnosis present

## 2020-02-20 DIAGNOSIS — Z20822 Contact with and (suspected) exposure to covid-19: Secondary | ICD-10-CM | POA: Diagnosis present

## 2020-02-20 DIAGNOSIS — I480 Paroxysmal atrial fibrillation: Secondary | ICD-10-CM | POA: Diagnosis present

## 2020-02-20 DIAGNOSIS — I5023 Acute on chronic systolic (congestive) heart failure: Secondary | ICD-10-CM | POA: Diagnosis not present

## 2020-02-20 DIAGNOSIS — N179 Acute kidney failure, unspecified: Secondary | ICD-10-CM | POA: Diagnosis present

## 2020-02-20 DIAGNOSIS — J90 Pleural effusion, not elsewhere classified: Secondary | ICD-10-CM | POA: Diagnosis not present

## 2020-02-20 DIAGNOSIS — I509 Heart failure, unspecified: Secondary | ICD-10-CM | POA: Diagnosis not present

## 2020-02-20 DIAGNOSIS — Z8042 Family history of malignant neoplasm of prostate: Secondary | ICD-10-CM

## 2020-02-20 DIAGNOSIS — Z8249 Family history of ischemic heart disease and other diseases of the circulatory system: Secondary | ICD-10-CM

## 2020-02-20 DIAGNOSIS — R079 Chest pain, unspecified: Secondary | ICD-10-CM | POA: Diagnosis not present

## 2020-02-20 DIAGNOSIS — Z8711 Personal history of peptic ulcer disease: Secondary | ICD-10-CM | POA: Diagnosis not present

## 2020-02-20 DIAGNOSIS — R5381 Other malaise: Secondary | ICD-10-CM | POA: Diagnosis present

## 2020-02-20 DIAGNOSIS — Z9841 Cataract extraction status, right eye: Secondary | ICD-10-CM | POA: Diagnosis not present

## 2020-02-20 DIAGNOSIS — R109 Unspecified abdominal pain: Secondary | ICD-10-CM | POA: Diagnosis not present

## 2020-02-20 DIAGNOSIS — E785 Hyperlipidemia, unspecified: Secondary | ICD-10-CM | POA: Diagnosis present

## 2020-02-20 DIAGNOSIS — Z7901 Long term (current) use of anticoagulants: Secondary | ICD-10-CM | POA: Diagnosis not present

## 2020-02-20 DIAGNOSIS — I5020 Unspecified systolic (congestive) heart failure: Secondary | ICD-10-CM | POA: Diagnosis not present

## 2020-02-20 DIAGNOSIS — I5043 Acute on chronic combined systolic (congestive) and diastolic (congestive) heart failure: Secondary | ICD-10-CM | POA: Diagnosis present

## 2020-02-20 DIAGNOSIS — E875 Hyperkalemia: Secondary | ICD-10-CM | POA: Diagnosis present

## 2020-02-20 DIAGNOSIS — Z833 Family history of diabetes mellitus: Secondary | ICD-10-CM

## 2020-02-20 DIAGNOSIS — Z9049 Acquired absence of other specified parts of digestive tract: Secondary | ICD-10-CM

## 2020-02-20 DIAGNOSIS — F0390 Unspecified dementia without behavioral disturbance: Secondary | ICD-10-CM | POA: Diagnosis present

## 2020-02-20 DIAGNOSIS — L97919 Non-pressure chronic ulcer of unspecified part of right lower leg with unspecified severity: Secondary | ICD-10-CM | POA: Diagnosis present

## 2020-02-20 DIAGNOSIS — X58XXXA Exposure to other specified factors, initial encounter: Secondary | ICD-10-CM | POA: Diagnosis present

## 2020-02-20 DIAGNOSIS — Z23 Encounter for immunization: Secondary | ICD-10-CM | POA: Diagnosis not present

## 2020-02-20 DIAGNOSIS — Z7984 Long term (current) use of oral hypoglycemic drugs: Secondary | ICD-10-CM | POA: Diagnosis not present

## 2020-02-20 DIAGNOSIS — Z89612 Acquired absence of left leg above knee: Secondary | ICD-10-CM | POA: Diagnosis not present

## 2020-02-20 DIAGNOSIS — I110 Hypertensive heart disease with heart failure: Principal | ICD-10-CM | POA: Diagnosis present

## 2020-02-20 DIAGNOSIS — Z87891 Personal history of nicotine dependence: Secondary | ICD-10-CM

## 2020-02-20 DIAGNOSIS — Z79899 Other long term (current) drug therapy: Secondary | ICD-10-CM

## 2020-02-20 DIAGNOSIS — D649 Anemia, unspecified: Secondary | ICD-10-CM | POA: Diagnosis present

## 2020-02-20 DIAGNOSIS — S80829A Blister (nonthermal), unspecified lower leg, initial encounter: Secondary | ICD-10-CM | POA: Diagnosis present

## 2020-02-20 DIAGNOSIS — E1151 Type 2 diabetes mellitus with diabetic peripheral angiopathy without gangrene: Secondary | ICD-10-CM | POA: Diagnosis present

## 2020-02-20 LAB — CBC WITH DIFFERENTIAL/PLATELET
Abs Immature Granulocytes: 0.02 10*3/uL (ref 0.00–0.07)
Basophils Absolute: 0 10*3/uL (ref 0.0–0.1)
Basophils Relative: 1 %
Eosinophils Absolute: 0.1 10*3/uL (ref 0.0–0.5)
Eosinophils Relative: 1 %
HCT: 40.7 % (ref 39.0–52.0)
Hemoglobin: 12.3 g/dL — ABNORMAL LOW (ref 13.0–17.0)
Immature Granulocytes: 0 %
Lymphocytes Relative: 30 %
Lymphs Abs: 1.5 10*3/uL (ref 0.7–4.0)
MCH: 29.4 pg (ref 26.0–34.0)
MCHC: 30.2 g/dL (ref 30.0–36.0)
MCV: 97.4 fL (ref 80.0–100.0)
Monocytes Absolute: 0.4 10*3/uL (ref 0.1–1.0)
Monocytes Relative: 8 %
Neutro Abs: 3.1 10*3/uL (ref 1.7–7.7)
Neutrophils Relative %: 60 %
Platelets: 218 10*3/uL (ref 150–400)
RBC: 4.18 MIL/uL — ABNORMAL LOW (ref 4.22–5.81)
RDW: 15.9 % — ABNORMAL HIGH (ref 11.5–15.5)
WBC: 5.2 10*3/uL (ref 4.0–10.5)
nRBC: 0 % (ref 0.0–0.2)

## 2020-02-20 LAB — COMPREHENSIVE METABOLIC PANEL
ALT: 14 U/L (ref 0–44)
AST: 19 U/L (ref 15–41)
Albumin: 4.1 g/dL (ref 3.5–5.0)
Alkaline Phosphatase: 99 U/L (ref 38–126)
Anion gap: 12 (ref 5–15)
BUN: 35 mg/dL — ABNORMAL HIGH (ref 8–23)
CO2: 17 mmol/L — ABNORMAL LOW (ref 22–32)
Calcium: 9.5 mg/dL (ref 8.9–10.3)
Chloride: 102 mmol/L (ref 98–111)
Creatinine, Ser: 1.88 mg/dL — ABNORMAL HIGH (ref 0.61–1.24)
GFR calc Af Amer: 36 mL/min — ABNORMAL LOW (ref >60)
GFR calc non Af Amer: 31 mL/min — ABNORMAL LOW (ref >60)
Glucose, Bld: 196 mg/dL — ABNORMAL HIGH (ref 70–99)
Potassium: 5.6 mmol/L — ABNORMAL HIGH (ref 3.5–5.1)
Sodium: 131 mmol/L — ABNORMAL LOW (ref 135–145)
Total Bilirubin: 1.3 mg/dL — ABNORMAL HIGH (ref 0.3–1.2)
Total Protein: 8 g/dL (ref 6.5–8.1)

## 2020-02-20 LAB — LIPASE, BLOOD: Lipase: 24 U/L (ref 11–51)

## 2020-02-20 LAB — CBG MONITORING, ED: Glucose-Capillary: 180 mg/dL — ABNORMAL HIGH (ref 70–99)

## 2020-02-20 LAB — BRAIN NATRIURETIC PEPTIDE: B Natriuretic Peptide: 1811 pg/mL — ABNORMAL HIGH (ref 0.0–100.0)

## 2020-02-20 LAB — RESPIRATORY PANEL BY RT PCR (FLU A&B, COVID)
Influenza A by PCR: NEGATIVE
Influenza B by PCR: NEGATIVE
SARS Coronavirus 2 by RT PCR: NEGATIVE

## 2020-02-20 MED ORDER — ATORVASTATIN CALCIUM 20 MG PO TABS
20.0000 mg | ORAL_TABLET | Freq: Every day | ORAL | Status: DC
  Administered 2020-02-21: 20 mg via ORAL
  Filled 2020-02-20: qty 1

## 2020-02-20 MED ORDER — FUROSEMIDE 10 MG/ML IJ SOLN
40.0000 mg | Freq: Once | INTRAMUSCULAR | Status: AC
  Administered 2020-02-20: 40 mg via INTRAVENOUS
  Filled 2020-02-20: qty 4

## 2020-02-20 MED ORDER — IOHEXOL 300 MG/ML  SOLN
75.0000 mL | Freq: Once | INTRAMUSCULAR | Status: AC | PRN
  Administered 2020-02-20: 75 mL via INTRAVENOUS

## 2020-02-20 MED ORDER — LUBIPROSTONE 24 MCG PO CAPS
24.0000 ug | ORAL_CAPSULE | Freq: Two times a day (BID) | ORAL | Status: DC
  Administered 2020-02-21 – 2020-02-22 (×3): 24 ug via ORAL
  Filled 2020-02-20 (×7): qty 1

## 2020-02-20 MED ORDER — ONDANSETRON HCL 4 MG PO TABS: 4.0000 mg | ORAL_TABLET | Freq: Four times a day (QID) | ORAL | Status: DC | PRN

## 2020-02-20 MED ORDER — SODIUM CHLORIDE 0.9 % IV SOLN: 250.0000 mL | INTRAVENOUS | Status: DC | PRN

## 2020-02-20 MED ORDER — SODIUM CHLORIDE 0.9% FLUSH
3.0000 mL | Freq: Two times a day (BID) | INTRAVENOUS | Status: DC
  Administered 2020-02-21 – 2020-02-22 (×2): 3 mL via INTRAVENOUS

## 2020-02-20 MED ORDER — METOPROLOL TARTRATE 25 MG PO TABS
25.0000 mg | ORAL_TABLET | Freq: Two times a day (BID) | ORAL | Status: DC
  Administered 2020-02-21 – 2020-02-22 (×3): 25 mg via ORAL
  Filled 2020-02-20 (×4): qty 1

## 2020-02-20 MED ORDER — ACETAMINOPHEN 325 MG PO TABS
650.0000 mg | ORAL_TABLET | Freq: Four times a day (QID) | ORAL | Status: DC | PRN
  Administered 2020-02-21: 650 mg via ORAL
  Filled 2020-02-20: qty 2

## 2020-02-20 MED ORDER — INSULIN ASPART 100 UNIT/ML ~~LOC~~ SOLN
0.0000 [IU] | Freq: Three times a day (TID) | SUBCUTANEOUS | Status: DC
  Administered 2020-02-21 (×3): 2 [IU] via SUBCUTANEOUS
  Administered 2020-02-22: 1 [IU] via SUBCUTANEOUS
  Administered 2020-02-22: 2 [IU] via SUBCUTANEOUS
  Filled 2020-02-20 (×2): qty 1

## 2020-02-20 MED ORDER — APIXABAN 2.5 MG PO TABS
2.5000 mg | ORAL_TABLET | Freq: Two times a day (BID) | ORAL | Status: DC
  Administered 2020-02-21 – 2020-02-22 (×3): 2.5 mg via ORAL
  Filled 2020-02-20 (×4): qty 1

## 2020-02-20 MED ORDER — ACETAMINOPHEN 650 MG RE SUPP: 650.0000 mg | Freq: Four times a day (QID) | RECTAL | Status: DC | PRN

## 2020-02-20 MED ORDER — SODIUM ZIRCONIUM CYCLOSILICATE 5 G PO PACK
10.0000 g | PACK | Freq: Once | ORAL | Status: AC
  Administered 2020-02-20: 10 g via ORAL
  Filled 2020-02-20: qty 2

## 2020-02-20 MED ORDER — ONDANSETRON HCL 4 MG/2ML IJ SOLN: 4.0000 mg | Freq: Four times a day (QID) | INTRAMUSCULAR | Status: DC | PRN

## 2020-02-20 MED ORDER — SODIUM CHLORIDE 0.9% FLUSH
3.0000 mL | INTRAVENOUS | Status: DC | PRN
  Administered 2020-02-21: 3 mL via INTRAVENOUS

## 2020-02-20 NOTE — ED Notes (Signed)
Pt's O2 84% on RA. Pt placed on 2LPM via [place] O2 98%

## 2020-02-20 NOTE — ED Provider Notes (Signed)
[facility] EMERGENCY DEPARTMENT Provider Note   CSN: [id number] Arrival date & time: 02/20/20  1249     History Chief Complaint  Patient presents with  . Abdominal Pain    Warren Mcdonald is a 86 y.o. male.  Patient complains of swelling to the abdomen swelling to the legs mild shortness of breath.  The history is provided by the patient and medical records.  Abdominal Pain Pain location:  Generalized Pain quality: aching   Pain radiates to:  Does not radiate Pain severity:  Mild Onset quality:  Sudden Timing:  Constant Progression:  Worsening Chronicity:  New Context: not alcohol use   Associated symptoms: no chest pain, no cough, no diarrhea, no fatigue and no hematuria        Past Medical History:  Diagnosis Date  . Abdominal distension (gaseous)   . Anemia, unspecified   . Chronic diastolic (congestive) heart failure (H[facility])   . Essential (primary) hypertension   . Hyperlipidemia   . PAF (paroxysmal atrial fibrillation) ([facility])   . Peripheral vascular disease ([facility]) 2011   Left above-knee popliteal to posterior tibial artery bypass   . PUD (peptic ulcer disease)   . Type 2 diabetes mellitus with diabetic peripheral angiopathy without gangrene [employer])     Patient Active Problem List   Diagnosis Date Noted  . S/P AKA (above knee amputation) (HCC) 08/09/2019  . PAF (paroxysmal atrial fibrillation) (HCC)   . Nonhealing surgical wound 06/25/2019  . Essential (primary) hypertension   . Hyperlipidemia, unspecified   . Type 2 diabetes mellitus with diabetic peripheral angiopathy without gangrene (H[facility])   . Chronic diastolic (congestive) heart failure (H[facility])   . Acute diastolic heart failure (HCC) 09/12/2014  . Cholecystitis, acute 09/06/2014  . Atrial fibrillation with rapid ventricular response (HCC) 09/06/2014  . 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 Abdominal pain 09/02/2014  . DM (diabetes mellitus) (HCC) 09/02/2014  . Pain in limb 12/27/2013  . Aftercare following  surgery of the circulatory system, NEC 06/28/2013  . Atherosclerosis of native artery of extremity with intermittent claudication (HCC) 06/28/2013  . Peripheral vascular disease (HCC) 12/28/2012  . PAD (peripheral artery disease) (HCC) 12/23/2011    Past Surgical History:  Procedure Laterality Date  . ABDOMINAL AORTOGRAM N/A 05/24/2019   Procedure: ABDOMINAL AORTOGRAM;  Surgeon: Maeola Harman, MD;  Location: [facility] INVASIVE [facility];  Service: Cardiovascular;  Laterality: N/A;  . AMPUTATION Left 06/16/2019   Procedure: AMPUTATION DIGIT FOURTH TOE LEFT FOOT;  Surgeon: Nada Libman, MD;  Location: [facility];  Service: Vascular;  Laterality: Left;  . AMPUTATION Left 06/26/2019   Procedure: AMPUTATION two DIGITs, left toe;  Surgeon: Maeola Harman, MD;  Location: [facility];  Service: Vascular;  Laterality: Left;  . AMPUTATION Left 07/02/2019   Procedure: left AMPUTATION ABOVE KNEE;  Surgeon: Larina Earthly, MD;  Location: [facility];  Service: Vascular;  Laterality: Left;  . APPENDECTOMY    . CATARACT EXTRACTION W/ INTRAOCULAR LENS  IMPLANT, BILATERAL    . CHOLECYSTECTOMY N/A 09/08/2014   Procedure: CHOLECYSTECTOMY;  Surgeon: Franky Macho Md, MD;  Location: [facility];  Service: General;  Laterality: N/A;  . EYE SURGERY    . I & D EXTREMITY Left 06/30/2019   Procedure: sharp incisonal DEBRIDEMENT of left foot including bone, tendon, muscle, and skin;  Surgeon: Cephus Shelling, MD;  Location: [facility] OR;  Service: Vascular;  Laterality: Left;  . LOWER EXTREMITY ANGIOGRAPHY Left 05/24/2019   Procedure: Lower Extremity Angiography;  Surgeon: Randie Heinz,  Dennard Schaumann, MD;  Location: [facility];  Service: Cardiovascular;  Laterality: Left;  . PERIPHERAL VASCULAR BALLOON ANGIOPLASTY Left 05/24/2019   Procedure: PERIPHERAL VASCULAR BALLOON ANGIOPLASTY;  Surgeon: Maeola Harman, MD;  Location: [facility];  Service: Cardiovascular;  Laterality: Left;  tibial bypass  . PR VEIN  BYPASS GRAFT,AORTO-FEM-POP     Left above knee popliteal to posterior tibial artery bypass graft       Family History  Problem Relation Age of Onset  . Heart disease Mother   . Hypertension Mother   . Heart attack Mother   . Heart attack Father   . Heart disease Father        Heart disease before age 62  . Cancer Sister   . Deep vein thrombosis Sister   . Diabetes Sister        Amputation-right leg  . Cancer Daughter   . Diabetes Daughter   . Cancer Brother   . Diabetes Other   . Prostate cancer Other     Social History   Tobacco Use  . Smoking status: Former Smoker    Types: Cigars    Quit date: 05/27/1988    Years since quitting: 31.7  . Smokeless tobacco: Former Neurosurgeon    Types: Chew    Quit date: 05/27/1988  Vaping Use  . Vaping Use: Never used  Substance Use Topics  . Alcohol use: No    Alcohol/week: 0.0 standard drinks  . Drug use: No    Home Medications Prior to Admission medications   Medication Sig Start Date End Date Taking? Authorizing Provider  apixaban (ELIQUIS) 5 MG TABS tablet Take 1 tablet (5 mg total) by mouth 2 (two) times daily. Patient taking differently: Take 5 mg by mouth daily.  03/11/19  Yes Branch, Dorothe Pea, MD  atorvastatin (LIPITOR) 20 MG tablet Take 1 tablet (20 mg total) by mouth daily at 6 PM. 07/09/19  Yes Baglia, Corrina, PA-C  losartan (COZAAR) 25 MG tablet Take 0.5 tablets (12.5 mg total) by mouth daily. 01/17/20 04/16/20 Yes Branch, Dorothe Pea, MD  lubiprostone (AMITIZA) 24 MCG capsule Take 24 mcg by mouth 2 (two) times daily with a meal.   Yes [provider]  metFORMIN (GLUCOPHAGE) 500 MG tablet Take 500 mg by mouth 2 (two) times daily at 8 am and 10 pm.    Yes [provider]  potassium chloride SA (KLOR-CON M20) 20 MEQ tablet 07/26/19 STARTING Today and Tomorrow, take 40 meq (2 tabs), then take 20 meq (1 tablet) daily thereafter 07/26/19  Yes Dyann Kief, PA-C  HYDROcodone-acetaminophen (NORCO/VICODIN) 5-325 MG  tablet Take 1 tablet by mouth every 4 (four) hours as needed for moderate pain. Patient not taking: Reported on 02/20/2020 07/09/19 07/08/20  Graceann Congress, PA-C  metoprolol succinate (TOPROL-XL) 200 MG 24 hr tablet Take 1 tablet (200 mg total) by mouth daily. Take with or immediately following a meal. Patient not taking: Reported on 02/20/2020 11/10/19   Ellsworth Lennox, PA-C    Allergies    Patient has no known allergies.  Review of Systems   Review of Systems  Constitutional: Negative for appetite change and fatigue.  HENT: Negative for congestion, ear discharge and sinus pressure.   Eyes: Negative for discharge.  Respiratory: Negative for cough.   Cardiovascular: Negative for chest pain.  Gastrointestinal: Positive for abdominal pain. Negative for diarrhea.  Genitourinary: Negative for frequency and hematuria.  Musculoskeletal: Negative for back pain.  Swelling to legs  Skin: Negative for rash.  Neurological: Negative for seizures and headaches.  Psychiatric/Behavioral: Negative for hallucinations.    Physical Exam Updated Vital Signs BP 102/80   Pulse (!) 46   Resp (!) 22   Ht  (1.854 m)   Wt 95.3 kg   SpO2 (!) 88%   BMI 27.71 kg/m   Physical Exam Vitals and nursing note reviewed.  Constitutional:      Appearance: He is well-developed.  HENT:     Head: Normocephalic.  Eyes:     General: No scleral icterus.    Conjunctiva/sclera: Conjunctivae normal.  Neck:     Thyroid: No thyromegaly.  Cardiovascular:     Rate and Rhythm: Normal rate and regular rhythm.     Heart sounds: No murmur heard.  No friction rub. No gallop.   Pulmonary:     Breath sounds: No stridor. No wheezing or rales.  Chest:     Chest wall: No tenderness.  Abdominal:     General: There is no distension.     Tenderness: There is abdominal tenderness. There is no rebound.     Comments: Abdominal swelling consistent with heart failure  Musculoskeletal:        General: Normal range  of motion.     Cervical back: Neck supple.     Comments: Patient has amputation of the left leg.  Patient is edema in the right leg  Lymphadenopathy:     Cervical: No cervical adenopathy.  Skin:    Findings: No erythema or rash.  Neurological:     Mental Status: He is alert and oriented to person, place, and time.     Motor: No abnormal muscle tone.     Coordination: Coordination normal.  Psychiatric:        Behavior: Behavior normal.     ED Results / Procedures / Treatments   Labs (all labs ordered are listed, but only abnormal results are displayed) Labs Reviewed  COMPREHENSIVE METABOLIC PANEL - Abnormal; Notable for the following components:      Result Value   Sodium 131 (*)    Potassium 5.6 (*)    CO2 17 (*)    Glucose, Bld 196 (*)    BUN 35 (*)    Creatinine, Ser 1.88 (*)    Total Bilirubin 1.3 (*)    GFR calc non Af Amer 31 (*)    GFR calc Af Amer 36 (*)    All other components within normal limits  BRAIN NATRIURETIC PEPTIDE - Abnormal; Notable for the following components:   B Natriuretic Peptide 1,811.0 (*)    All other components within normal limits  CBC WITH DIFFERENTIAL/PLATELET - Abnormal; Notable for the following components:   RBC 4.18 (*)    Hemoglobin 12.3 (*)    RDW 15.9 (*)    All other components within normal limits  CBG MONITORING, ED - Abnormal; Notable for the following components:   Glucose-Capillary 180 (*)    All other components within normal limits  RESPIRATORY PANEL BY RT PCR (FLU A&B, COVID)  LIPASE, BLOOD  URINALYSIS, ROUTINE W REFLEX MICROSCOPIC    EKG EKG Interpretation  Date/Time:  Sunday February 20 2020 14:40:46 EDT Ventricular Rate:  110 PR Interval:    QRS Duration: 107 QT Interval:  355 QTC Calculation: 481 R Axis:   -35 Text Interpretation: Atrial fibrillation Left axis deviation Probable anteroseptal infarct, old Abnormal T, consider ischemia, lateral leads Confirmed by Bethann Berkshire [phone]) on 02/20/2020 6:25:48  PM   Radiology CT ABDOMEN PELVIS W CONTRAST  Result Date: 02/20/2020 CLINICAL DATA:  Patient with abdominal pain and fever. EXAM: CT ABDOMEN AND PELVIS WITH CONTRAST TECHNIQUE: Multidetector CT imaging of the abdomen and pelvis was performed using the standard protocol following bolus administration of intravenous contrast. CONTRAST:  76mL OMNIPAQUE IOHEXOL 300 MG/ML  SOLN COMPARISON:  CT abdomen pelvis September 02, 2014. FINDINGS: Lower chest: Cardiomegaly. Small bilateral pleural effusions. Bilateral lower lobe ground-glass and consolidative opacities. Hepatobiliary: Liver is normal in size and contour. No focal lesion identified. Prior cholecystectomy. Pancreas: Unremarkable Spleen: Unremarkable Adrenals/Urinary Tract: Normal adrenal glands. 3.2 cm cyst superior pole left kidney. Additional bilateral renal cysts are demonstrated. No hydronephrosis. Urinary bladder is unremarkable. Stomach/Bowel: There is circumferential wall thickening of the second portion of the duodenum with surrounding fat stranding. No evidence for bowel obstruction. Small amount of fluid within the pelvis as well as tracking within the right pericolic gutter. Vascular/Lymphatic: Normal caliber abdominal aorta. Peripheral calcified noncalcified atherosclerotic plaque. No retroperitoneal lymphadenopathy. Reproductive: Enlarged prostate. Other: Small bilateral fat containing inguinal hernias. Musculoskeletal: Lower thoracic and lumbar spine degenerative changes. No aggressive or acute appearing osseous lesions. IMPRESSION: 1. Circumferential wall thickening of the second portion of the duodenum with surrounding fat stranding most compatible with duodenitis. Underlying duodenal ulcer not excluded. 2. Small bilateral pleural effusions. Bilateral lower lobe ground-glass and consolidative opacities which may represent edema, atelectasis or infection. Electronically Signed   By: Annia Belt M.D.   On: 02/20/2020 18:02   DG Chest Port 1  View  Result Date: 02/20/2020 CLINICAL DATA:  Pain and swelling EXAM: PORTABLE CHEST 1 VIEW COMPARISON:  09/03/2014 FINDINGS: Cardiac shadow is enlarged. Lungs are well aerated bilaterally. No focal infiltrate or sizable effusion is seen. No bony abnormality is noted. IMPRESSION: No active disease. Electronically Signed   By: Alcide Clever M.D.   On: 02/20/2020 15:30    Procedures Procedures (including critical care time)  Medications Ordered in ED Medications  furosemide (LASIX) injection 40 mg (has no administration in time range)  iohexol (OMNIPAQUE) 300 MG/ML solution 75 mL (75 mLs Intravenous Contrast Given 02/20/20 1735)    ED Course  I have reviewed the triage vital signs and the nursing notes.  Pertinent labs & imaging results that were available during my care of the patient were reviewed by me and considered in my medical decision making (see chart for details). Verneita Griffes CRITICAL CARE Performed by: Bethann Berkshire Total critical care time: 35 minutes Critical care time was exclusive of separately billable procedures and treating other patients. Critical care was necessary to treat or prevent imminent or life-threatening deterioration. Critical care was time spent personally by me on the following activities: development of treatment plan with patient and/or surrogate as well as nursing, discussions with consultants, evaluation of patient's response to treatment, examination of patient, obtaining history from patient or surrogate, ordering and performing treatments and interventions, ordering and review of laboratory studies, ordering and review of radiographic studies, pulse oximetry and re-evaluation of patient's condition.    MDM Rules/Calculators/A&P                          Patient with anasarca.  He will be admitted for diuresis       This patient presents to the ED for concern of abdominal pain, this involves an extensive number of treatment options, and is a complaint  that carries with it a high risk of complications and morbidity.  The differential diagnosis includes gastritis   Lab Tests:   I Ordered, reviewed, and interpreted labs, which included CBC and chemistries which shows elevated potassium of 5.6 mild anemia  Medicines ordered:   I ordered medication Lasix for heart failure  Imaging Studies ordered:   I ordered imaging studies which included chest x-ray  I independently visualized and interpreted imaging which showed no active disease  Additional history obtained:   Additional history obtained from significant other  Previous records obtained and reviewed.  Consultations Obtained:   I consulted hospice and discussed lab and imaging findings  Reevaluation:  After the interventions stated above, I reevaluated the patient and found mild improvement  Critical Interventions:  .   Final Clinical Impression(s) / ED Diagnoses Final diagnoses:  Systolic congestive heart failure, unspecified HF chronicity (HCC)    Rx / DC Orders ED Discharge Orders    None       Bethann Berkshire, MD 02/20/20 1853

## 2020-02-20 NOTE — Progress Notes (Signed)
ANTICOAGULATION CONSULT NOTE - Follow Up Consult  Pharmacy Consult for apixaban Indication: atrial fibrillation  No Known Allergies  Patient Measurements: Height: 6\' 1"  (185.4 cm) Weight: 95.3 kg (210 lb) IBW/kg (Calculated) : 79.9  Vital Signs: BP: 114/81 (09/26 2030) Pulse Rate: 99 (09/26 2030)  Labs: Recent Labs    02/20/20 1613  HGB 12.3*  HCT 40.7  PLT 218  CREATININE 1.88*    Estimated Creatinine Clearance: 31.3 mL/min (A) (by C-G formula based on SCr of 1.88 mg/dL (H)).   Medications:  (Not in a hospital admission)  Scheduled:  . apixaban  2.5 mg Oral BID  . [START ON 02/21/2020] atorvastatin  20 mg Oral q1800  . [START ON 02/21/2020] insulin aspart  0-9 Units Subcutaneous TID WC  . [START ON 02/21/2020] lubiprostone  24 mcg Oral BID WC  . metoprolol tartrate  25 mg Oral BID  . sodium chloride flush  3 mL Intravenous Q12H  . sodium zirconium cyclosilicate  10 g Oral Once   Infusions:  . sodium chloride     PRN: sodium chloride, acetaminophen **OR** acetaminophen, ondansetron **OR** ondansetron (ZOFRAN) IV, sodium chloride flush Anti-infectives (From admission, onward)   None      Assessment: 86 year old male with afib, pharmacy consulted for apixaban which patient is on prior to admission.   Goal of Therapy:  apixaban anticoagulation Monitor platelets by anticoagulation protocol: Yes   Plan:  apixaban 2.5mg  po  Bid CBC MWF Monitor for signs and symptoms of bleeding   97 Ayla Dunigan 02/20/2020,9:00 PM

## 2020-02-20 NOTE — H&P (Signed)
TRH H&P    Patient Demographics:    Warren Mcdonald, is a 86 y.o. male  MRN: [id number]  DOB - Jul 16, 1929  Admit Date - 02/20/2020  Referring MD/NP/PA: Dr. Estell Harpin  Outpatient Primary MD for the patient is Patient, No Pcp Per  Patient coming from: Home  Chief complaint-leg swelling   HPI:    Warren Mcdonald  is a 86 y.o. male, with history of chronic systolic heart failure, paroxysmal atrial fibrillation, diabetes mellitus type 2, s/p left AKA, hypertension, hyperlipidemia came to hospital with complaints of shortness of breath and leg swelling.  Patient is a poor historian.  He says he has been having shortness of breath for past few weeks.  Also has been coughing up clear phlegm.  Denies chest pain.  Denies nausea vomiting or diarrhea. In the ED chest x-ray was unremarkable, BNP was elevated at 1811 BUN/creatinine was 35/1.88. Potassium 5.6. Patient was given 1 dose of Lasix 40 mg IV in the ED.    Review of systems:    In addition to the HPI above,   All other systems reviewed and are negative.    Past History of the following :    Past Medical History:  Diagnosis Date  . Abdominal distension (gaseous)   . Anemia, unspecified   . Chronic diastolic (congestive) heart failure (HCC)   . Essential (primary) hypertension   . Hyperlipidemia   . PAF (paroxysmal atrial fibrillation) (HCC)   . Peripheral vascular disease (HCC) 2011   Left above-knee popliteal to posterior tibial artery bypass   . PUD (peptic ulcer disease)   . Type 2 diabetes mellitus with diabetic peripheral angiopathy without gangrene [facility])       Past Surgical History:  Procedure Laterality Date  . ABDOMINAL AORTOGRAM N/A 05/24/2019   Procedure: ABDOMINAL AORTOGRAM;  Surgeon: Maeola Harman, MD;  Location: [facility] INVASIVE CV LAB;  Service: Cardiovascular;  Laterality: N/A;  . AMPUTATION Left 06/16/2019   Procedure:  AMPUTATION DIGIT FOURTH TOE LEFT FOOT;  Surgeon: Nada Libman, MD;  Location: [facility] OR;  Service: Vascular;  Laterality: Left;  . AMPUTATION Left 06/26/2019   Procedure: AMPUTATION two DIGITs, left toe;  Surgeon: Maeola Harman, MD;  Location[facility] OR;  Service: Vascular;  Laterality: Left;  . AMPUTATION Left 07/02/2019   Procedure: left AMPUTATION ABOVE KNEE;  Surgeon: Larina Earthly, MD;  Location: [facility] OR;  Service: Vascular;  Laterality: Left;  . APPENDECTOMY    . CATARACT EXTRACTION W/ INTRAOCULAR LENS  IMPLANT, BILATERAL    . CHOLECYSTECTOMY N/A 09/08/2014   Procedure: CHOLECYSTECTOMY;  Surgeon: Franky Macho Md, MD;  Location: [facility] ORS;  Service: General;  Laterality: N/A;  . EYE SURGERY    . I & D EXTREMITY Left 06/30/2019   Procedure: sharp incisonal DEBRIDEMENT of left foot including bone, tendon, muscle, and skin;  Surgeon: Cephus Shelling, MD;  Location: [facility] OR;  Service: Vascular;  Laterality: Left;  . LOWER EXTREMITY ANGIOGRAPHY Left 05/24/2019   Procedure: Lower Extremity Angiography;  Surgeon: Maeola Harman, MD;  Location:  [facility] INVASIVE CV LAB;  Service: Cardiovascular;  Laterality: Left;  . PERIPHERAL VASCULAR BALLOON ANGIOPLASTY Left 05/24/2019   Procedure: PERIPHERAL VASCULAR BALLOON ANGIOPLASTY;  Surgeon: Maeola Harman, MD;  Location: [facility] INVASIVE CV LAB;  Service: Cardiovascular;  Laterality: Left;  tibial bypass  . PR VEIN BYPASS GRAFT,AORTO-FEM-POP     Left above knee popliteal to posterior tibial artery bypass graft      Social History:      Social History   Tobacco Use  . Smoking status: Former Smoker    Types: Cigars    Quit date: 05/27/1988    Years since quitting: 31.7  . Smokeless tobacco: Former Neurosurgeon    Types: Chew    Quit date: 05/27/1988  Substance Use Topics  . Alcohol use: No    Alcohol/week: 0.0 standard drinks       Family History :     Family History  Problem Relation Age of Onset  . Heart disease Mother   .  Hypertension Mother   . Heart attack Mother   . Heart attack Father   . Heart disease Father        Heart disease before age 29  . Cancer Sister   . Deep vein thrombosis Sister   . Diabetes Sister        Amputation-right leg  . Cancer Daughter   . Diabetes Daughter   . Cancer Brother   . Diabetes Other   . Prostate cancer Other       Home Medications:   Prior to Admission medications   Medication Sig Start Date End Date Taking? Authorizing Provider  apixaban (ELIQUIS) 5 MG TABS tablet Take 1 tablet (5 mg total) by mouth 2 (two) times daily. Patient taking differently: Take 5 mg by mouth daily.  03/11/19  Yes Branch, Dorothe Pea, MD  atorvastatin (LIPITOR) 20 MG tablet Take 1 tablet (20 mg total) by mouth daily at 6 PM. 07/09/19  Yes Baglia, Corrina, PA-C  losartan (COZAAR) 25 MG tablet Take 0.5 tablets (12.5 mg total) by mouth daily. 01/17/20 04/16/20 Yes Branch, Dorothe Pea, MD  lubiprostone (AMITIZA) 24 MCG capsule Take 24 mcg by mouth 2 (two) times daily with a meal.   Yes [provider]  metFORMIN (GLUCOPHAGE) 500 MG tablet Take 500 mg by mouth 2 (two) times daily at 8 am and 10 pm.    Yes [provider]  potassium chloride SA (KLOR-CON M20) 20 MEQ tablet 07/26/19 STARTING Today and Tomorrow, take 40 meq (2 tabs), then take 20 meq (1 tablet) daily thereafter 07/26/19  Yes Dyann Kief, PA-C  HYDROcodone-acetaminophen (NORCO/VICODIN) 5-325 MG tablet Take 1 tablet by mouth every 4 (four) hours as needed for moderate pain. Patient not taking: Reported on 02/20/2020 07/09/19 07/08/20  Graceann Congress, PA-C  metoprolol succinate (TOPROL-XL) 200 MG 24 hr tablet Take 1 tablet (200 mg total) by mouth daily. Take with or immediately following a meal. Patient not taking: Reported on 02/20/2020 11/10/19   Ellsworth Lennox, PA-C     Allergies:    No Known Allergies   Physical Exam:   Vitals  Blood pressure 113/80, pulse 99, resp. rate 20, height 6\' 1"  (1.854 m), weight  95.3 kg, SpO2 96 %.  1.  General: Appears in no acute distress  2. Psychiatric: Alert, oriented x3, intact insight and judgment  3. Neurologic: Cranial nerves II through grossly intact, no focal deficit noted  4. HEENMT:  Atraumatic normocephalic, extraocular muscles are intact  5. Respiratory :  Clear to auscultation bilaterally  6. Cardiovascular : S1-S2, irregular, no murmur auscultated, 1+ pitting edema noted in right lower extremity  7. Gastrointestinal:  Abdomen is distended, nontender, no organomegaly  8. Skin:  Blister noted in right lower extremity  9.Musculoskeletal:  S/p left AKA    Data Review:    CBC Recent Labs  Lab 02/20/20 1613  WBC 5.2  HGB 12.3*  HCT 40.7  PLT 218  MCV 97.4  MCH 29.4  MCHC 30.2  RDW 15.9*  LYMPHSABS 1.5  MONOABS 0.4  EOSABS 0.1  BASOSABS 0.0   ------------------------------------------------------------------------------------------------------------------  Results for orders placed or performed during the hospital encounter of 02/20/20 (from the past 48 hour(s))  CBG monitoring, ED     Status: Abnormal   Collection Time: 02/20/20  2:39 PM  Result Value Ref Range   Glucose-Capillary 180 (H) 70 - 99 mg/dL    Comment: Glucose reference range applies only to samples taken after fasting for at least 8 hours.  Lipase, blood     Status: None   Collection Time: 02/20/20  4:13 PM  Result Value Ref Range   Lipase 24 11 - 51 U/L    Comment: Performed at [facility], [address], [place], [place] [address]  Comprehensive metabolic panel     Status: Abnormal   Collection Time: 02/20/20  4:13 PM  Result Value Ref Range   Sodium 131 (L) 135 - 145 mmol/L   Potassium 5.6 (H) 3.5 - 5.1 mmol/L   Chloride 102 98 - 111 mmol/L   CO2 17 (L) 22 - 32 mmol/L   Glucose, Bld 196 (H) 70 - 99 mg/dL    Comment: Glucose reference range applies only to samples taken after fasting for at least 8 hours.   BUN 35 (H) 8 - 23 mg/dL   Creatinine,  Ser 6.04 (H) 0.61 - 1.24 mg/dL   Calcium 9.5 8.9 - 54.0 mg/dL   Total Protein 8.0 6.5 - 8.1 g/dL   Albumin 4.1 3.5 - 5.0 g/dL   AST 19 15 - 41 U/L   ALT 14 0 - 44 U/L   Alkaline Phosphatase 99 38 - 126 U/L   Total Bilirubin 1.3 (H) 0.3 - 1.2 mg/dL   GFR calc non Af Amer 31 (L) >60 mL/min   GFR calc Af Amer 36 (L) >60 mL/min   Anion gap 12 5 - 15    Comment: Performed at [facility], [address] [place], [place] [address]  Brain natriuretic peptide     Status: Abnormal   Collection Time: 02/20/20  4:13 PM  Result Value Ref Range   B Natriuretic Peptide 1,811.0 (H) 0.0 - 100.0 pg/mL    Comment: Performed at [facility], [address], [place], [place] [address]  CBC with Differential/Platelet     Status: Abnormal   Collection Time: 02/20/20  4:13 PM  Result Value Ref Range   WBC 5.2 4.0 - 10.5 K/uL   RBC 4.18 (L) 4.22 - 5.81 MIL/uL   Hemoglobin 12.3 (L) 13.0 - 17.0 g/dL   HCT 95.6 39 - 52 %   MCV 97.4 80.0 - 100.0 fL   MCH 29.4 26.0 - 34.0 pg   MCHC 30.2 30.0 - 36.0 g/dL   RDW 21.3 (H) 08.6 - 57.8 %   Platelets 218 150 - 400 K/uL   nRBC 0.0 0.0 - 0.2 %   Neutrophils Relative % 60 %   Neutro Abs 3.1 1.7 - 7.7 K/uL   Lymphocytes  Relative 30 %   Lymphs Abs 1.5 0.7 - 4.0 K/uL   Monocytes Relative 8 %   Monocytes Absolute 0.4 0 - 1 K/uL   Eosinophils Relative 1 %   Eosinophils Absolute 0.1 0 - 0 K/uL   Basophils Relative 1 %   Basophils Absolute 0.0 0 - 0 K/uL   Immature Granulocytes 0 %   Abs Immature Granulocytes 0.02 0.00 - 0.07 K/uL    Comment: Performed at [facility], [address], [place], [place] [address]    Chemistries  Recent Labs  Lab 02/20/20 1613  NA 131*  K 5.6*  CL 102  CO2 17*  GLUCOSE 196*  BUN 35*  CREATININE 1.88*  CALCIUM 9.5  AST 19  ALT 14  ALKPHOS 99  BILITOT 1.3*    ------------------------------------------------------------------------------------------------------------------  ------------------------------------------------------------------------------------------------------------------ GFR: Estimated Creatinine Clearance: 31.3 mL/min (A) (by C-G formula based on SCr of 1.88 mg/dL (H)). Liver Function Tests: Recent Labs  Lab 02/20/20 1613  AST 19  ALT 14  ALKPHOS 99  BILITOT 1.3*  PROT 8.0  ALBUMIN 4.1   Recent Labs  Lab 02/20/20 1613  LIPASE 24   5 CBG: Recent Labs  Lab 02/20/20 1439  GLUCAP 180*    --------------------------------------------------------------------------------------------------------------- Urine analysis:    Component Value Date/Time   COLORURINE YELLOW 09/03/2014 0930   APPEARANCEUR HAZY (A) 09/03/2014 0930   LABSPEC 1.020 09/03/2014 0930   PHURINE 5.0 09/03/2014 0930   GLUCOSEU NEGATIVE 09/03/2014 0930   HGBUR LARGE (A) 09/03/2014 0930   BILIRUBINUR NEGATIVE 09/03/2014 0930   KETONESUR TRACE (A) 09/03/2014 0930   PROTEINUR 100 (A) 09/03/2014 0930   UROBILINOGEN 1.0 09/03/2014 0930   NITRITE NEGATIVE 09/03/2014 0930   LEUKOCYTESUR NEGATIVE 09/03/2014 0930      Imaging Results:    CT ABDOMEN PELVIS W CONTRAST  Result Date: 02/20/2020 CLINICAL DATA:  Patient with abdominal pain and fever. EXAM: CT ABDOMEN AND PELVIS WITH CONTRAST TECHNIQUE: Multidetector CT imaging of the abdomen and pelvis was performed using the standard protocol following bolus administration of intravenous contrast. CONTRAST:  76mL OMNIPAQUE IOHEXOL 300 MG/ML  SOLN COMPARISON:  CT abdomen pelvis September 02, 2014. FINDINGS: Lower chest: Cardiomegaly. Small bilateral pleural effusions. Bilateral lower lobe ground-glass and consolidative opacities. Hepatobiliary: Liver is normal in size and contour. No focal lesion identified. Prior cholecystectomy. Pancreas: Unremarkable Spleen: Unremarkable Adrenals/Urinary Tract: Normal adrenal  glands. 3.2 cm cyst superior pole left kidney. Additional bilateral renal cysts are demonstrated. No hydronephrosis. Urinary bladder is unremarkable. Stomach/Bowel: There is circumferential wall thickening of the second portion of the duodenum with surrounding fat stranding. No evidence for bowel obstruction. Small amount of fluid within the pelvis as well as tracking within the right pericolic gutter. Vascular/Lymphatic: Normal caliber abdominal aorta. Peripheral calcified noncalcified atherosclerotic plaque. No retroperitoneal lymphadenopathy. Reproductive: Enlarged prostate. Other: Small bilateral fat containing inguinal hernias. Musculoskeletal: Lower thoracic and lumbar spine degenerative changes. No aggressive or acute appearing osseous lesions. IMPRESSION: 1. Circumferential wall thickening of the second portion of the duodenum with surrounding fat stranding most compatible with duodenitis. Underlying duodenal ulcer not excluded. 2. Small bilateral pleural effusions. Bilateral lower lobe ground-glass and consolidative opacities which may represent edema, atelectasis or infection. Electronically Signed   By: Annia Belt M.D.   On: 02/20/2020 18:02   DG Chest Port 1 View  Result Date: 02/20/2020 CLINICAL DATA:  Pain and swelling EXAM: PORTABLE CHEST 1 VIEW COMPARISON:  09/03/2014 FINDINGS: Cardiac shadow is enlarged. Lungs are well aerated bilaterally. No focal infiltrate  or sizable effusion is seen. No bony abnormality is noted. IMPRESSION: No active disease. Electronically Signed   By: Alcide Clever M.D.   On: 02/20/2020 15:30    My personal review of EKG: Rhythm irregular   Assessment & Plan:    Active Problems:   CHF exacerbation (HCC)   1. Acute systolic CHF exacerbation-patient presented with right lower extremity edema, BNP elevated 1811.  Patient given Lasix 40 mg IV in the ED.  Follow BMP in am. 2. Acute kidney injury-patient's BUN/creatinine is elevated at 35/1.88, baseline creatinine  1.13 as of March 2021.  Could be from underlying systolic CHF, creatinine should improve with diuresis.  Patient given Lasix as above.  Follow BMP in am. 3. Hyperkalemia-potassium is 5.6, patient was on potassium supplementation at home.  Will hold potassium.  Lasix given as above.  Will also give 1 dose of Lokelma 10 g p.o. x1.  Check potassium level in a.m. 4. Diabetes mellitus type 2-hold Metformin.  Will start sliding scale insulin with NovoLog. 5. Hyperlipidemia-continue Lipitor. 6. Paroxysmal atrial fibrillation-heart rate is controlled, patient has metoprolol 200 mg daily ordered in the home med list but not taking.  Blood pressure is soft.  Will start metoprolol at low dose of 25 mg p.o. twice daily.  Continue anticoagulation with Eliquis. 7. Hypertension-hold Cozaar due to renal insufficiency, hyperkalemia and soft blood pressure.   DVT Prophylaxis-   Eliquis  AM Labs Ordered, also please review Full Orders  Family Communication: Admission, patients condition and plan of care including tests being ordered have been discussed with the patient and and his wife at bedside who indicate understanding and agree with the plan and Code Status.  Code Status: Full code  Admission status: Inpatient :The appropriate admission status for this patient is INPATIENT. Inpatient status is judged to be reasonable and necessary in order to provide the required intensity of service to ensure the patient's safety. The patient's presenting symptoms, physical exam findings, and initial radiographic and laboratory data in the context of their chronic comorbidities is felt to place them at high risk for further clinical deterioration. Furthermore, it is not anticipated that the patient will be medically stable for discharge from the hospital within 2 midnights of admission. The following factors support the admission status of inpatient.     The patient's presenting symptoms include leg swelling The worrisome  physical exam findings include CHF. The initial radiographic and laboratory data are worrisome because of CHF. The chronic co-morbidities include diabetes mellitus type 2.       * I certify that at the point of admission it is my clinical judgment that the patient will require inpatient hospital care spanning beyond 2 midnights from the point of admission due to high intensity of service, high risk for further deterioration and high frequency of surveillance required.*  Time spent in minutes : 60 minutes   Shatoria Stooksbury S Maat Kafer M.D

## 2020-02-20 NOTE — ED Triage Notes (Signed)
Pt c/o abdominal pain and swelling x 1 day. Denies n/v/d. States he had a a LLE amputation 5-6 months ago.

## 2020-02-21 ENCOUNTER — Other Ambulatory Visit: Payer: Self-pay

## 2020-02-21 DIAGNOSIS — I5023 Acute on chronic systolic (congestive) heart failure: Secondary | ICD-10-CM

## 2020-02-21 LAB — URINALYSIS, ROUTINE W REFLEX MICROSCOPIC
Bacteria, UA: NONE SEEN
Bilirubin Urine: NEGATIVE
Glucose, UA: NEGATIVE mg/dL
Ketones, ur: NEGATIVE mg/dL
Leukocytes,Ua: NEGATIVE
Nitrite: NEGATIVE
Protein, ur: 30 mg/dL — AB
Specific Gravity, Urine: 1.024 (ref 1.005–1.030)
pH: 5 (ref 5.0–8.0)

## 2020-02-21 LAB — CBC
HCT: 39.1 % (ref 39.0–52.0)
Hemoglobin: 11.9 g/dL — ABNORMAL LOW (ref 13.0–17.0)
MCH: 29.5 pg (ref 26.0–34.0)
MCHC: 30.4 g/dL (ref 30.0–36.0)
MCV: 97 fL (ref 80.0–100.0)
Platelets: 207 10*3/uL (ref 150–400)
RBC: 4.03 MIL/uL — ABNORMAL LOW (ref 4.22–5.81)
RDW: 15.9 % — ABNORMAL HIGH (ref 11.5–15.5)
WBC: 5 10*3/uL (ref 4.0–10.5)
nRBC: 0 % (ref 0.0–0.2)

## 2020-02-21 LAB — BASIC METABOLIC PANEL
Anion gap: 13 (ref 5–15)
BUN: 47 mg/dL — ABNORMAL HIGH (ref 8–23)
CO2: 18 mmol/L — ABNORMAL LOW (ref 22–32)
Calcium: 9.6 mg/dL (ref 8.9–10.3)
Chloride: 104 mmol/L (ref 98–111)
Creatinine, Ser: 2.02 mg/dL — ABNORMAL HIGH (ref 0.61–1.24)
GFR calc Af Amer: 33 mL/min — ABNORMAL LOW (ref >60)
GFR calc non Af Amer: 29 mL/min — ABNORMAL LOW (ref >60)
Glucose, Bld: 192 mg/dL — ABNORMAL HIGH (ref 70–99)
Potassium: 4.5 mmol/L (ref 3.5–5.1)
Sodium: 135 mmol/L (ref 135–145)

## 2020-02-21 LAB — COMPREHENSIVE METABOLIC PANEL
ALT: 41 U/L (ref 0–44)
AST: 51 U/L — ABNORMAL HIGH (ref 15–41)
Albumin: 4 g/dL (ref 3.5–5.0)
Alkaline Phosphatase: 98 U/L (ref 38–126)
Anion gap: 15 (ref 5–15)
BUN: 41 mg/dL — ABNORMAL HIGH (ref 8–23)
CO2: 17 mmol/L — ABNORMAL LOW (ref 22–32)
Calcium: 9.4 mg/dL (ref 8.9–10.3)
Chloride: 101 mmol/L (ref 98–111)
Creatinine, Ser: 2.06 mg/dL — ABNORMAL HIGH (ref 0.61–1.24)
GFR calc Af Amer: 33 mL/min — ABNORMAL LOW (ref >60)
GFR calc non Af Amer: 28 mL/min — ABNORMAL LOW (ref >60)
Glucose, Bld: 225 mg/dL — ABNORMAL HIGH (ref 70–99)
Potassium: 5.3 mmol/L — ABNORMAL HIGH (ref 3.5–5.1)
Sodium: 133 mmol/L — ABNORMAL LOW (ref 135–145)
Total Bilirubin: 2 mg/dL — ABNORMAL HIGH (ref 0.3–1.2)
Total Protein: 7.5 g/dL (ref 6.5–8.1)

## 2020-02-21 LAB — GLUCOSE, CAPILLARY
Glucose-Capillary: 152 mg/dL — ABNORMAL HIGH (ref 70–99)
Glucose-Capillary: 175 mg/dL — ABNORMAL HIGH (ref 70–99)

## 2020-02-21 LAB — HEMOGLOBIN A1C
Hgb A1c MFr Bld: 7.4 % — ABNORMAL HIGH (ref 4.8–5.6)
Mean Plasma Glucose: 165.68 mg/dL

## 2020-02-21 LAB — SODIUM, URINE, RANDOM: Sodium, Ur: 102 mmol/L

## 2020-02-21 LAB — CBG MONITORING, ED
Glucose-Capillary: 193 mg/dL — ABNORMAL HIGH (ref 70–99)
Glucose-Capillary: 177 mg/dL — ABNORMAL HIGH (ref 70–99)

## 2020-02-21 MED ORDER — FUROSEMIDE 10 MG/ML IJ SOLN: 40.0000 mg | Freq: Two times a day (BID) | INTRAMUSCULAR | Status: DC

## 2020-02-21 MED ORDER — INFLUENZA VAC A&B SA ADJ QUAD 0.5 ML IM PRSY
0.5000 mL | PREFILLED_SYRINGE | INTRAMUSCULAR | Status: AC
  Administered 2020-02-22: 0.5 mL via INTRAMUSCULAR
  Filled 2020-02-21: qty 0.5

## 2020-02-21 MED ORDER — FUROSEMIDE 10 MG/ML IJ SOLN
60.0000 mg | Freq: Two times a day (BID) | INTRAMUSCULAR | Status: DC
  Administered 2020-02-21 – 2020-02-22 (×3): 60 mg via INTRAVENOUS
  Filled 2020-02-21 (×3): qty 6

## 2020-02-21 NOTE — Consult Note (Signed)
WOC Nurse wound consult note Consultation was completed by review of records, images and assistance from the bedside nurse/clinical staff.   Reason for Consult: leg blister  Wound type: ruptured blister; in the presence of PVD and CHF. History of vascular surgery issue; with amputation of the LE AKA in Feb. 2021 Pressure Injury POA: NA Measurement: see nursing flowsheets Wound bed: 100% pink and clean Drainage (amount, consistency, odor) unable to assess, no dressing in image, but wound appears dry. No exudate Periwound: intact, patient reported edema Dressing procedure/placement/frequency: Single layer of xeroform as non adherent with antibacterial effects. Protect and insulation for moist wound healing. Change daily.    With longstanding history of PAD and DM would caution patient to monitor for and acute changes of this area as well.     Re consult if needed, will not follow at this time. Thanks  Pachia Strum [employer], RN,CWOCN, CNS, CWON-AP [phone])

## 2020-02-21 NOTE — Progress Notes (Signed)
PROGRESS NOTE    YAN PANKRATZ  ZJI:[id number] DOB: [birth year] DOA: 02/20/2020 PCP: Patient, No Pcp Per   Brief Narrative:  Patient is 86 year old male with history of chronic systolic congestive heart failure, paroxysmal A. fib, diabetes type 2, status post left AKA, hypertension, hyperlipidemia who presented to the emergency department complains of shortness of breath, leg swelling.  Patient is a poor historian.  He also reported coughing at home.  On presentation chest x-ray did not show any acute finding.  BNP was elevated at 1811.  Creatinine of 1.8, potassium of 5.6.  He was found to be volume overloaded on presentation and was given Lasix 40 mg IV once.  Patient admitted for the management of acute on chronic systolic congestive heart failure.  Assessment & Plan:   Active Problems:   CHF exacerbation (HCC)   Acute on chronic systolic congestive heart failure: Presented with right lower extremity. elevated BNP.  Given Lasix 40 mg IV once.  Echo done on May this year showed Left ventricular ejection fraction of  40 to 45%.  Continue Lasix 60 mg twice a day for now.  AKI: Baseline creatinine in the range of 1.1.  Presented with creatinine of 1.88, now trended up to 2.  Suspected cardiorenal syndrome.  Continue diuresis.  Monitor BMP.  Hyperkalemia: Presented with potassium 5.6.  On potassium supplementation at home.  Continue to monitor BMP.  Given Lokelma 10 mg once.  Proximal A. fib: Currently heart rate is controlled.  On metoprolol at home. Blood pressure soft.  Continue metoprolol at reduced dose.  On anticoagulation with Eliquis.  Hypertension: Losartan on hold due to AKI and hyperkalemia.  Continue to monitor blood pressure.  Diabetes type 2: Metformin on hold.  Continue sliding scale insulin.  Monitor blood sugars  Hyperlipidemia: Continue Lipitor  Peripheral vascular disease: Status post left BKA  Right lower extremity ulcer: Most likely secondary to rupture of a  bulla.  Wound care consulted.  Dementia: Patient confused at times as per the wife.  During my evaluation he was alert and oriented to place, but not time.  Continue supportive care  Debility/deconditioning: We will request a physical therapy assessment.         DVT prophylaxis:Eliquis Code Status: Full Family Communication: Discussed with wife on phone om 02/21/20 Status is: Inpatient  Remains inpatient appropriate because:Inpatient level of care appropriate due to severity of illness   Dispo: The patient is from: Home              Anticipated d/c is to: Home              Anticipated d/c date is: 1 day              Patient currently is not medically stable to d/c.     Consultants: None  Procedures:None  Antimicrobials:  Anti-infectives (From admission, onward)   None      Subjective:  Patient seen and examined the bedside this morning.  Looks comfortable during my evaluation.  Alert and oriented.  Lying on the bed.  Wife at the bedside.  Was not in any kind of significant respiratory distress.  On room air.  Has trace right-sided lower extremity edema.  Objective: Vitals:   02/21/20 0430 02/21/20 0645 02/21/20 0700 02/21/20 0800  BP: 117/78 105/81 105/90 (!) 120/96  Pulse: 98 96 98 92  Resp: (!) 22 17 20  (!) 9  SpO2: 96% 100% 95%   Weight:      Height:  No intake or output data in the 24 hours ending 02/21/20 0917 Filed Weights   02/20/20 1302  Weight: 95.3 kg    Examination:  General exam: Appears calm and comfortable ,Not in distress, deconditioned, debilitated HEENT:PERRL,Oral mucosa moist, Ear/Nose normal on gross exam Respiratory system: Bilateral few basal crackles  cardiovascular system: S1 & S2 heard, RRR. No JVD, murmurs, rubs, gallops or clicks. No pedal edema. Gastrointestinal system: Abdomen is nondistended, soft and nontender. No organomegaly or masses felt. Normal bowel sounds heard. Central nervous system: Alert and oriented. No  focal neurological deficits. Extremities: Trace right sided lower extremity edema, no clubbing ,no cyanosis Skin: Superficial ulceration of the right leg   Data Reviewed: I have personally reviewed following labs and imaging studies  CBC: Recent Labs  Lab 02/20/20 1613 02/21/20 0458  WBC 5.2 5.0  NEUTROABS 3.1  --   HGB 12.3* 11.9*  HCT 40.7 39.1  MCV 97.4 97.0  PLT 218 207   Basic Metabolic Panel: Recent Labs  Lab 02/20/20 1613 02/21/20 0458  NA 131* 133*  K 5.6* 5.3*  CL 102 101  CO2 17* 17*  GLUCOSE 196* 225*  BUN 35* 41*  CREATININE 1.88* 2.06*  CALCIUM 9.5 9.4   GFR: Estimated Creatinine Clearance: 28.6 mL/min (A) (by C-G formula based on SCr of 2.06 mg/dL (H)). Liver Function Tests: Recent Labs  Lab 02/20/20 1613 02/21/20 0458  AST 19 51*  ALT 14 41  ALKPHOS 99 98  BILITOT 1.3* 2.0*  PROT 8.0 7.5  ALBUMIN 4.1 4.0   Recent Labs  Lab 02/20/20 1613  LIPASE 24   No results for input(s): AMMONIA in the last 168 hours. Coagulation Profile: No results for input(s): INR, PROTIME in the last 168 hours. Cardiac Enzymes: No results for input(s): CKTOTAL, CKMB, CKMBINDEX, TROPONINI in the last 168 hours. BNP (last 3 results) No results for input(s): PROBNP in the last 8760 hours. HbA1C: Recent Labs    02/20/20 1613  HGBA1C 7.4*   CBG: Recent Labs  Lab 02/20/20 1439 02/21/20 0858  GLUCAP 180* 193*   Lipid Profile: No results for input(s): CHOL, HDL, LDLCALC, TRIG, CHOLHDL, LDLDIRECT in the last 72 hours. Thyroid Function Tests: No results for input(s): TSH, T4TOTAL, FREET4, T3FREE, THYROIDAB in the last 72 hours. Anemia Panel: No results for input(s): VITAMINB12, FOLATE, FERRITIN, TIBC, IRON, RETICCTPCT in the last 72 hours. Sepsis Labs: No results for input(s): PROCALCITON, LATICACIDVEN in the last 168 hours.  Recent Results (from the past 240 hour(s))  Respiratory Panel by RT PCR (Flu A&B, Covid) - Nasopharyngeal Swab     Status: None    Collection Time: 02/20/20  7:46 PM   Specimen: Nasopharyngeal Swab  Result Value Ref Range Status   SARS Coronavirus 2 by RT PCR NEGATIVE NEGATIVE Final    Comment: (NOTE) SARS-CoV-2 target nucleic acids are NOT DETECTED.  The SARS-CoV-2 RNA is generally detectable in upper respiratoy specimens during the acute phase of infection. The lowest concentration of SARS-CoV-2 viral copies this assay can detect is 131 copies/mL. A negative result does not preclude SARS-Cov-2 infection and should not be used as the sole basis for treatment or other patient management decisions. A negative result may occur with  improper specimen collection/handling, submission of specimen other than nasopharyngeal swab, presence of viral mutation(s) within the areas targeted by this assay, and inadequate number of viral copies (<131 copies/mL). A negative result must be combined with clinical observations, patient history, and epidemiological information. The expected result is Negative.  Fact Sheet for Patients:  https://www.moore.com/  Fact Sheet for Healthcare Providers:  https://www.young.biz/  This test is no t yet approved or cleared by the [place] FDA and  has been authorized for detection and/or diagnosis of SARS-CoV-2 by FDA under an Emergency Use Authorization (EUA). This EUA will remain  in effect (meaning this test can be used) for the duration of the COVID-19 declaration under Section 564(b)(1) of the Act, 21 U.S.C. section 360bbb-3(b)(1), unless the authorization is terminated or revoked sooner.     Influenza A by PCR NEGATIVE NEGATIVE Final   Influenza B by PCR NEGATIVE NEGATIVE Final    Comment: (NOTE) The Xpert Xpress SARS-CoV-2/FLU/RSV assay is intended as an aid in  the diagnosis of influenza from Nasopharyngeal swab specimens and  should not be used as a sole basis for treatment. Nasal washings and  aspirates are unacceptable for Xpert  Xpress SARS-CoV-2/FLU/RSV  testing.  Fact Sheet for Patients: https://www.moore.com/  Fact Sheet for Healthcare Providers: https://www.young.biz/  This test is not yet approved or cleared by the [place] FDA and  has been authorized for detection and/or diagnosis of SARS-CoV-2 by  FDA under an Emergency Use Authorization (EUA). This EUA will remain  in effect (meaning this test can be used) for the duration of the  Covid-19 declaration under Section 564(b)(1) of the Act, 21  U.S.C. section 360bbb-3(b)(1), unless the authorization is  terminated or revoked. Performed at [facility], [address], [place], [place] [address]          Radiology Studies: CT ABDOMEN PELVIS W CONTRAST  Result Date: 02/20/2020 CLINICAL DATA:  Patient with abdominal pain and fever. EXAM: CT ABDOMEN AND PELVIS WITH CONTRAST TECHNIQUE: Multidetector CT imaging of the abdomen and pelvis was performed using the standard protocol following bolus administration of intravenous contrast. CONTRAST:  48mL OMNIPAQUE IOHEXOL 300 MG/ML  SOLN COMPARISON:  CT abdomen pelvis September 02, 2014. FINDINGS: Lower chest: Cardiomegaly. Small bilateral pleural effusions. Bilateral lower lobe ground-glass and consolidative opacities. Hepatobiliary: Liver is normal in size and contour. No focal lesion identified. Prior cholecystectomy. Pancreas: Unremarkable Spleen: Unremarkable Adrenals/Urinary Tract: Normal adrenal glands. 3.2 cm cyst superior pole left kidney. Additional bilateral renal cysts are demonstrated. No hydronephrosis. Urinary bladder is unremarkable. Stomach/Bowel: There is circumferential wall thickening of the second portion of the duodenum with surrounding fat stranding. No evidence for bowel obstruction. Small amount of fluid within the pelvis as well as tracking within the right pericolic gutter. Vascular/Lymphatic: Normal caliber abdominal aorta. Peripheral calcified noncalcified  atherosclerotic plaque. No retroperitoneal lymphadenopathy. Reproductive: Enlarged prostate. Other: Small bilateral fat containing inguinal hernias. Musculoskeletal: Lower thoracic and lumbar spine degenerative changes. No aggressive or acute appearing osseous lesions. IMPRESSION: 1. Circumferential wall thickening of the second portion of the duodenum with surrounding fat stranding most compatible with duodenitis. Underlying duodenal ulcer not excluded. 2. Small bilateral pleural effusions. Bilateral lower lobe ground-glass and consolidative opacities which may represent edema, atelectasis or infection. Electronically Signed   By: Annia Belt M.D.   On: 02/20/2020 18:02   DG Chest Port 1 View  Result Date: 02/20/2020 CLINICAL DATA:  Pain and swelling EXAM: PORTABLE CHEST 1 VIEW COMPARISON:  09/03/2014 FINDINGS: Cardiac shadow is enlarged. Lungs are well aerated bilaterally. No focal infiltrate or sizable effusion is seen. No bony abnormality is noted. IMPRESSION: No active disease. Electronically Signed   By: Alcide Clever M.D.   On: 02/20/2020 15:30        Scheduled Meds: . apixaban  2.5  mg Oral BID  . atorvastatin  20 mg Oral q1800  . insulin aspart  0-9 Units Subcutaneous TID WC  . lubiprostone  24 mcg Oral BID WC  . metoprolol tartrate  25 mg Oral BID  . sodium chloride flush  3 mL Intravenous Q12H   Continuous Infusions: . sodium chloride       LOS: 1 day    Time spent: 35 mins.More than 50% of that time was spent in counseling and/or coordination of care.      Burnadette Pop, MD [facility] P9/27/2021, 9:17 AM

## 2020-02-21 NOTE — Consult Note (Signed)
I have placed a request via Secure Chat to Dr. Adhikari requesting photos of the wound areas of concern to be placed in the EMR.   Jaevian Shean MSN,RN,CWOCN, CNS, CWON-AP [phone] 

## 2020-02-21 NOTE — ED Notes (Signed)
Pt had large incontinent episode of urine, unable to measure urine output

## 2020-02-22 LAB — BASIC METABOLIC PANEL
Anion gap: 13 (ref 5–15)
BUN: 49 mg/dL — ABNORMAL HIGH (ref 8–23)
CO2: 19 mmol/L — ABNORMAL LOW (ref 22–32)
Calcium: 9.4 mg/dL (ref 8.9–10.3)
Chloride: 104 mmol/L (ref 98–111)
Creatinine, Ser: 1.86 mg/dL — ABNORMAL HIGH (ref 0.61–1.24)
GFR calc Af Amer: 37 mL/min — ABNORMAL LOW (ref >60)
GFR calc non Af Amer: 32 mL/min — ABNORMAL LOW (ref >60)
Glucose, Bld: 148 mg/dL — ABNORMAL HIGH (ref 70–99)
Potassium: 3.5 mmol/L (ref 3.5–5.1)
Sodium: 136 mmol/L (ref 135–145)

## 2020-02-22 LAB — GLUCOSE, CAPILLARY
Glucose-Capillary: 123 mg/dL — ABNORMAL HIGH (ref 70–99)
Glucose-Capillary: 173 mg/dL — ABNORMAL HIGH (ref 70–99)

## 2020-02-22 MED ORDER — APIXABAN 2.5 MG PO TABS
2.5000 mg | ORAL_TABLET | Freq: Two times a day (BID) | ORAL | 1 refills | Status: DC
Start: 2020-02-22 — End: 2020-03-27

## 2020-02-22 MED ORDER — FUROSEMIDE 40 MG PO TABS: 40.0000 mg | ORAL_TABLET | Freq: Every day | ORAL | 0 refills | Status: DC

## 2020-02-22 MED ORDER — METOPROLOL TARTRATE 25 MG PO TABS
25.0000 mg | ORAL_TABLET | Freq: Two times a day (BID) | ORAL | 1 refills | Status: DC
Start: 2020-02-22 — End: 2020-03-27

## 2020-02-22 MED ORDER — LINAGLIPTIN 5 MG PO TABS: 5.0000 mg | ORAL_TABLET | Freq: Every day | ORAL | 1 refills | Status: DC

## 2020-02-22 MED ORDER — POTASSIUM CHLORIDE ER 20 MEQ PO TBCR: 20.0000 meq | EXTENDED_RELEASE_TABLET | Freq: Every day | ORAL | 0 refills | Status: DC

## 2020-02-22 NOTE — Discharge Summary (Signed)
Physician Discharge Summary  Warren Mcdonald BHA:[id number] DOB: 1929-12-23 DOA: 02/20/2020  PCP: Patient, No Pcp Per  Admit date: 02/20/2020 Discharge date: 02/22/2020  Admitted From: Home Disposition:  Home  Discharge Condition:Stable CODE STATUS:FULL Diet recommendation: Heart Healthy   Brief/Interim Summary: Patient is 86 year old male with history of chronic systolic congestive heart failure, paroxysmal A. fib, diabetes type 2, status post left AKA, hypertension, hyperlipidemia who presented to the emergency department complains of shortness of breath, leg swelling.  Patient is a poor historian.  He also reported coughing at home.  On presentation chest x-ray did not show any acute finding.  BNP was elevated at 1811.  Creatinine of 1.8, potassium of 5.6.  He was found to be volume overloaded on presentation and was given Lasix 40 mg IV once.  Patient admitted for the management of acute on chronic systolic congestive heart failure.  He was started on Lasix IV.  Patient's lower extremity edema significantly improved.  His respiratory status has improved with diuresis and is currently on room air.  Patient was seen by physical therapist and recommended discussing facility but patient's family opted for home health.  He is hemodynamically stable for discharge home today.  Following problems were addressed during his hospitalization:   Acute on chronic systolic congestive heart failure: Presented with right lower extremity. elevated BNP.  Given Lasix 40 mg IV once.  Echo done on May this year showed Left ventricular ejection fraction of  40 to 45%.  Started on  Lasix 60 mg twice .  Improvement in the lower extremity swelling.  He is currently on room air, denies any shortness of breath.  Lasix changed to 40 mg daily on discharge.  AKI: Baseline creatinine in the range of 1.1.  Presented with creatinine of 1.88,  trended up to 2 but now downtrending again. Suspected cardiorenal syndrome.   Continue Lasix.  Check BMP in a week  Hyperkalemia: Presented with potassium 5.6.    Taking losartan.  Potassium 3.5 today.  Since he has been started on Lasix, will continue supplementation 20 mEq daily at home.  Proximal A. fib: Currently heart rate is controlled.  On metoprolol at home. Blood pressure soft .  Continue metoprolol at reduced dose.  On anticoagulation with Eliquis.  Dose of Eliquis decreased to 2.5 mg due to current kidney function.  Hypertension: Losartan on hold due to AKI and hyperkalemia.    Blood pressure currently stable  Diabetes type 2: On Metformin at home.  Due to renal insufficiency we will discontinue Metformin and put him on Tradjenta.  Hyperlipidemia: Continue Lipitor  Peripheral vascular disease: Status post left BKA  Right lower extremity ulcer: Most likely secondary to rupture of a bulla.  Wound care consulted.  Dementia: Patient confused at times as per the wife.  During my evaluation he was alert and oriented to place, but not time.  Continue supportive care  Debility/deconditioning:  PT recommended skilled nursing facility, family opted for home health.   Discharge Diagnoses:  Active Problems:   CHF exacerbation [name])    Discharge Instructions  Discharge Instructions    Diet - low sodium heart healthy   Complete by: As directed    Discharge instructions   Complete by: As directed    1)Please take prescribed medication as instructed. 2)Follow up with your PCP in a week and do a BMP test to check your kidney function. 3)Follow up with your cardiologist in 2 to 3 weeks. 4)Follow up with home health.  Discharge wound care:   Complete by: As directed    As per wound care nurse   Increase activity slowly   Complete by: As directed      Allergies as of 02/22/2020   No Known Allergies     Medication List    STOP taking these medications   losartan 25 MG tablet Commonly known as: COZAAR   metFORMIN 500 MG tablet Commonly  known as: GLUCOPHAGE   metoprolol 200 MG 24 hr tablet Commonly known as: TOPROL-XL   potassium chloride SA 20 MEQ tablet Commonly known as: Klor-Con M20     TAKE these medications   apixaban 2.5 MG Tabs tablet Commonly known as: ELIQUIS Take 1 tablet (2.5 mg total) by mouth 2 (two) times daily. What changed:   medication strength  how much to take   atorvastatin 20 MG tablet Commonly known as: LIPITOR Take 1 tablet (20 mg total) by mouth daily at 6 PM.   furosemide 40 MG tablet Commonly known as: Lasix Take 1 tablet (40 mg total) by mouth daily.   HYDROcodone-acetaminophen 5-325 MG tablet Commonly known as: NORCO/VICODIN Take 1 tablet by mouth every 4 (four) hours as needed for moderate pain.   linagliptin 5 MG Tabs tablet Commonly known as: Tradjenta Take 1 tablet (5 mg total) by mouth daily.   lubiprostone 24 MCG capsule Commonly known as: AMITIZA Take 24 mcg by mouth 2 (two) times daily with a meal.   metoprolol tartrate 25 MG tablet Commonly known as: LOPRESSOR Take 1 tablet (25 mg total) by mouth 2 (two) times daily.   Potassium Chloride ER 20 MEQ Tbcr Take 20 mEq by mouth daily.            Discharge Care Instructions  (From admission, onward)         Start     Ordered   02/22/20 0000  Discharge wound care:       Comments: As per wound care nurse   02/22/20 1204          Follow-up Information    Branch, Dorothe Pea, MD. Schedule an appointment as soon as possible for a visit in 2 week(s).   Specialty: Cardiology Contact information: [address] [place] [address] [phone]              No Known Allergies  Consultations:  None   Procedures/Studies: CT ABDOMEN PELVIS W CONTRAST  Result Date: 02/20/2020 CLINICAL DATA:  Patient with abdominal pain and fever. EXAM: CT ABDOMEN AND PELVIS WITH CONTRAST TECHNIQUE: Multidetector CT imaging of the abdomen and pelvis was performed using the standard protocol following bolus  administration of intravenous contrast. CONTRAST:  64mL OMNIPAQUE IOHEXOL 300 MG/ML  SOLN COMPARISON:  CT abdomen pelvis September 02, 2014. FINDINGS: Lower chest: Cardiomegaly. Small bilateral pleural effusions. Bilateral lower lobe ground-glass and consolidative opacities. Hepatobiliary: Liver is normal in size and contour. No focal lesion identified. Prior cholecystectomy. Pancreas: Unremarkable Spleen: Unremarkable Adrenals/Urinary Tract: Normal adrenal glands. 3.2 cm cyst superior pole left kidney. Additional bilateral renal cysts are demonstrated. No hydronephrosis. Urinary bladder is unremarkable. Stomach/Bowel: There is circumferential wall thickening of the second portion of the duodenum with surrounding fat stranding. No evidence for bowel obstruction. Small amount of fluid within the pelvis as well as tracking within the right pericolic gutter. Vascular/Lymphatic: Normal caliber abdominal aorta. Peripheral calcified noncalcified atherosclerotic plaque. No retroperitoneal lymphadenopathy. Reproductive: Enlarged prostate. Other: Small bilateral fat containing inguinal hernias. Musculoskeletal: Lower thoracic and lumbar spine degenerative changes.  No aggressive or acute appearing osseous lesions. IMPRESSION: 1. Circumferential wall thickening of the second portion of the duodenum with surrounding fat stranding most compatible with duodenitis. Underlying duodenal ulcer not excluded. 2. Small bilateral pleural effusions. Bilateral lower lobe ground-glass and consolidative opacities which may represent edema, atelectasis or infection. Electronically Signed   By: Annia Belt M.D.   On: 02/20/2020 18:02   DG Chest Port 1 View  Result Date: 02/20/2020 CLINICAL DATA:  Pain and swelling EXAM: PORTABLE CHEST 1 VIEW COMPARISON:  09/03/2014 FINDINGS: Cardiac shadow is enlarged. Lungs are well aerated bilaterally. No focal infiltrate or sizable effusion is seen. No bony abnormality is noted. IMPRESSION: No active  disease. Electronically Signed   By: Alcide Clever M.D.   On: 02/20/2020 15:30      Subjective:  Patient seen and examined at the bedside this morning.  Hemodynamically stable for discharge.  Discussed with wife at bedside and granddaughter on phone  Discharge Exam: Vitals:   02/22/20 0559 02/22/20 0903  BP: (!) 120/91 131/89  Pulse: (!) 106 (!) 116  Resp: 18   Temp: 97.6 F (36.4 C)   SpO2: 100% 97%   Vitals:   02/21/20 1630 02/21/20 1726 02/22/20 0559 02/22/20 0903  BP: 117/83 102/73 (!) 120/91 131/89  Pulse: 80 78 (!) 106 (!) 116  Resp: 16 18 18    Temp:  97.9 F (36.6 C) 97.6 F (36.4 C)   TempSrc:  Oral Oral   SpO2: 100% 100% 100% 97%  Weight:  91.7 kg    Height:  6\' 1"  (1.854 m)      General: Pt is alert, awake, not in acute distress Cardiovascular: RRR, S1/S2 +, no rubs, no gallops Respiratory: CTA bilaterally, no wheezing, no rhonchi Abdominal: Soft, NT, ND, bowel sounds + Extremities: no edema, no cyanosis,left BKA    The results of significant diagnostics from this hospitalization (including imaging, microbiology, ancillary and laboratory) are listed below for reference.     Microbiology: Recent Results (from the past 240 hour(s))  Respiratory Panel by RT PCR (Flu A&B, Covid) - Nasopharyngeal Swab     Status: None   Collection Time: 02/20/20  7:46 PM   Specimen: Nasopharyngeal Swab  Result Value Ref Range Status   SARS Coronavirus 2 by RT PCR NEGATIVE NEGATIVE Final    Comment: (NOTE) SARS-CoV-2 target nucleic acids are NOT DETECTED.  The SARS-CoV-2 RNA is generally detectable in upper respiratoy specimens during the acute phase of infection. The lowest concentration of SARS-CoV-2 viral copies this assay can detect is 131 copies/mL. A negative result does not preclude SARS-Cov-2 infection and should not be used as the sole basis for treatment or other patient management decisions. A negative result may occur with  improper specimen  collection/handling, submission of specimen other than nasopharyngeal swab, presence of viral mutation(s) within the areas targeted by this assay, and inadequate number of viral copies (<131 copies/mL). A negative result must be combined with clinical observations, patient history, and epidemiological information. The expected result is Negative.  Fact Sheet for Patients:  https://www.moore.com/  Fact Sheet for Healthcare Providers:  https://www.young.biz/  This test is no t yet approved or cleared by the [place] FDA and  has been authorized for detection and/or diagnosis of SARS-CoV-2 by FDA under an Emergency Use Authorization (EUA). This EUA will remain  in effect (meaning this test can be used) for the duration of the COVID-19 declaration under Section 564(b)(1) of the Act, 21 U.S.C. section 360bbb-3(b)(1), unless the authorization  is terminated or revoked sooner.     Influenza A by PCR NEGATIVE NEGATIVE Final   Influenza B by PCR NEGATIVE NEGATIVE Final    Comment: (NOTE) The Xpert Xpress SARS-CoV-2/FLU/RSV assay is intended as an aid in  the diagnosis of influenza from Nasopharyngeal swab specimens and  should not be used as a sole basis for treatment. Nasal washings and  aspirates are unacceptable for Xpert Xpress SARS-CoV-2/FLU/RSV  testing.  Fact Sheet for Patients: https://www.moore.com/  Fact Sheet for Healthcare Providers: https://www.young.biz/  This test is not yet approved or cleared by the [place] FDA and  has been authorized for detection and/or diagnosis of SARS-CoV-2 by  FDA under an Emergency Use Authorization (EUA). This EUA will remain  in effect (meaning this test can be used) for the duration of the  Covid-19 declaration under Section 564(b)(1) of the Act, 21  U.S.C. section 360bbb-3(b)(1), unless the authorization is  terminated or revoked. Performed at [facility], [address] [place], [place] [address]      Labs: BNP (last 3 results) Recent Labs    02/20/20 1613  BNP 1,811.0*   Basic Metabolic Panel: Recent Labs  Lab 02/20/20 1613 02/21/20 0458 02/21/20 1707 02/22/20 0345  NA 131* 133* 135 136  K 5.6* 5.3* 4.5 3.5  CL 102 101 104 104  CO2 17* 17* 18* 19*  GLUCOSE 196* 225* 192* 148*  BUN 35* 41* 47* 49*  CREATININE 1.88* 2.06* 2.02* 1.86*  CALCIUM 9.5 9.4 9.6 9.4   Liver Function Tests: Recent Labs  Lab 02/20/20 1613 02/21/20 0458  AST 19 51*  ALT 14 41  ALKPHOS 99 98  BILITOT 1.3* 2.0*  PROT 8.0 7.5  ALBUMIN 4.1 4.0   Recent Labs  Lab 02/20/20 1613  LIPASE 24   No results for input(s): AMMONIA in the last 168 hours. CBC: Recent Labs  Lab 02/20/20 1613 02/21/20 0458  WBC 5.2 5.0  NEUTROABS 3.1  --   HGB 12.3* 11.9*  HCT 40.7 39.1  MCV 97.4 97.0  PLT 218 207   Cardiac Enzymes: No results for input(s): CKTOTAL, CKMB, CKMBINDEX, TROPONINI in the last 168 hours. BNP: Invalid input(s): POCBNP CBG: Recent Labs  Lab 02/21/20 1220 02/21/20 1755 02/21/20 2335 02/22/20 0734 02/22/20 1122  GLUCAP 177* 175* 152* 123* 173*   D-Dimer No results for input(s): DDIMER in the last 72 hours. Hgb A1c Recent Labs    02/20/20 1613  HGBA1C 7.4*   Lipid Profile No results for input(s): CHOL, HDL, LDLCALC, TRIG, CHOLHDL, LDLDIRECT in the last 72 hours. Thyroid function studies No results for input(s): TSH, T4TOTAL, T3FREE, THYROIDAB in the last 72 hours.  Invalid input(s): FREET3 Anemia work up No results for input(s): VITAMINB12, FOLATE, FERRITIN, TIBC, IRON, RETICCTPCT in the last 72 hours. Urinalysis    Component Value Date/Time   COLORURINE YELLOW 02/20/2020 1301   APPEARANCEUR CLEAR 02/20/2020 1301   LABSPEC 1.024 02/20/2020 1301   PHURINE 5.0 02/20/2020 1301   GLUCOSEU NEGATIVE 02/20/2020 1301   HGBUR SMALL (A) 02/20/2020 1301   BILIRUBINUR NEGATIVE 02/20/2020 1301   KETONESUR NEGATIVE  02/20/2020 1301   PROTEINUR 30 (A) 02/20/2020 1301   UROBILINOGEN 1.0 09/03/2014 0930   NITRITE NEGATIVE 02/20/2020 1301   LEUKOCYTESUR NEGATIVE 02/20/2020 1301   Sepsis Labs Invalid input(s): PROCALCITONIN,  WBC,  LACTICIDVEN Microbiology Recent Results (from the past 240 hour(s))  Respiratory Panel by RT PCR (Flu A&B, Covid) - Nasopharyngeal Swab     Status: None  Collection Time: 02/20/20  7:46 PM   Specimen: Nasopharyngeal Swab  Result Value Ref Range Status   SARS Coronavirus 2 by RT PCR NEGATIVE NEGATIVE Final    Comment: (NOTE) SARS-CoV-2 target nucleic acids are NOT DETECTED.  The SARS-CoV-2 RNA is generally detectable in upper respiratoy specimens during the acute phase of infection. The lowest concentration of SARS-CoV-2 viral copies this assay can detect is 131 copies/mL. A negative result does not preclude SARS-Cov-2 infection and should not be used as the sole basis for treatment or other patient management decisions. A negative result may occur with  improper specimen collection/handling, submission of specimen other than nasopharyngeal swab, presence of viral mutation(s) within the areas targeted by this assay, and inadequate number of viral copies (<131 copies/mL). A negative result must be combined with clinical observations, patient history, and epidemiological information. The expected result is Negative.  Fact Sheet for Patients:  https://www.moore.com/  Fact Sheet for Healthcare Providers:  https://www.young.biz/  This test is no t yet approved or cleared by the [place] FDA and  has been authorized for detection and/or diagnosis of SARS-CoV-2 by FDA under an Emergency Use Authorization (EUA). This EUA will remain  in effect (meaning this test can be used) for the duration of the COVID-19 declaration under Section 564(b)(1) of the Act, 21 U.S.C. section 360bbb-3(b)(1), unless the authorization is terminated  or revoked sooner.     Influenza A by PCR NEGATIVE NEGATIVE Final   Influenza B by PCR NEGATIVE NEGATIVE Final    Comment: (NOTE) The Xpert Xpress SARS-CoV-2/FLU/RSV assay is intended as an aid in  the diagnosis of influenza from Nasopharyngeal swab specimens and  should not be used as a sole basis for treatment. Nasal washings and  aspirates are unacceptable for Xpert Xpress SARS-CoV-2/FLU/RSV  testing.  Fact Sheet for Patients: https://www.moore.com/  Fact Sheet for Healthcare Providers: https://www.young.biz/  This test is not yet approved or cleared by the [place] FDA and  has been authorized for detection and/or diagnosis of SARS-CoV-2 by  FDA under an Emergency Use Authorization (EUA). This EUA will remain  in effect (meaning this test can be used) for the duration of the  Covid-19 declaration under Section 564(b)(1) of the Act, 21  U.S.C. section 360bbb-3(b)(1), unless the authorization is  terminated or revoked. Performed at [facility], [address], [place], [place] [address]     Please note: You were cared for by a hospitalist during your hospital stay. Once you are discharged, your primary care physician will handle any further medical issues. Please note that NO REFILLS for any discharge medications will be authorized once you are discharged, as it is imperative that you return to your primary care physician (or establish a relationship with a primary care physician if you do not have one) for your post hospital discharge needs so that they can reassess your need for medications and monitor your lab values.    Time coordinating discharge: 40 minutes  SIGNED:   Burnadette Pop, MD  [facility] 02/22/2020, 12:10 PM Pager [phone]  If 7PM-7AM, please contact night-coverage [email or web address] Password [email or web address]

## 2020-02-22 NOTE — Care Management Important Message (Signed)
Important Message  Patient Details  Name: Warren Mcdonald MRN: [id number] Date of Birth: 1929/10/18   Medicare Important Message Given:  Yes     Corey Harold 02/22/2020, 2:05 PM

## 2020-02-22 NOTE — Evaluation (Signed)
Physical Therapy Evaluation Patient Details Name: Warren Mcdonald MRN: [id number] DOB: [birth year] Today's Date: 02/22/2020   History of Present Illness  86 y.o. male, with history of CHF, afib, diabetes mellitus type 2, s/p left AKA, HTN, with complaints of shortness of breath and leg swelling.  Clinical Impression  Pt admitted with above diagnosis. PTA, pt able to transfer to [facility], mobilize around the home and complete bathing and dressing without assistance. Pt currently unable to stand from EOB with MAX A from therapist. Pt fatigues with lateral scooting at EOB, able to return to supine without physical assist. Pt's spouse in room, reports she works during the day so pt will be home alone. Pt has WC, RW, prosthesis that needs refit, BSC and has been receiving therapy at home. Pt currently with functional limitations due to the deficits listed below (see PT Problem List). Pt will benefit from skilled PT to increase their independence and safety with mobility to allow discharge to the venue listed below.       Follow Up Recommendations SNF    Equipment Recommendations  None recommended by PT    Recommendations for Other Services       Precautions / Restrictions Precautions Precautions: Fall Precaution Comments: L AKA Restrictions Weight Bearing Restrictions: No      Mobility  Bed Mobility Overal bed mobility: Needs Assistance Bed Mobility: Supine to Sit;Sit to Supine  Supine to sit: Supervision;HOB elevated Sit to supine: Supervision;HOB elevated   General bed mobility comments: supine<>sit with elevated HOB and use of bedrail to upright self and scoot to EOB  Transfers Overall transfer level: Needs assistance Equipment used: Rolling walker (2 wheeled) Transfers: Sit to/from Stand;Lateral/Scoot Transfers Sit to Stand: Max assist;+2 physical assistance   Lateral/Scoot Transfers: Min guard General transfer comment: attempted STS from elevated EOB but unable despite multiple  attempts and MAX A from therapist; min G to lateral scoot up to [facility] before returning to supine  Ambulation/Gait  General Gait Details: WC baseline  Stairs            Wheelchair Mobility    Modified Rankin (Stroke Patients Only)       Balance Overall balance assessment: Needs assistance Sitting-balance support: Feet supported;Bilateral upper extremity supported Sitting balance-Leahy Scale: Fair Sitting balance - Comments: able to maintain upright posture with lateral scooting without LOB       Standing balance comment: unable                             Pertinent Vitals/Pain Pain Assessment: No/denies pain    Home Living Family/patient expects to be discharged to:: Private residence Living Arrangements: Spouse/significant other Available Help at Discharge: Family;Available PRN/intermittently (spouse works) Type of Home: House Home Access: Ramped entrance     Home Layout: One level Home Equipment: Cane - single point;Walker - 4 wheels;Walker - 2 wheels;Bedside commode;Wheelchair - Careers adviser (comment) (slideboard) Additional Comments: WC is in poor condition per pt and spouse "too narrow and cheap"    Prior Function Level of Independence: Needs assistance   Gait / Transfers Assistance Needed: Pt reports transfers to [facility] independently without slideboard, completes car transfers independently  ADL's / Homemaking Assistance Needed: Pt reports performs bathing and dressing independently and spouse performs cooking, cleaning, driving  Comments: Pt currently engaged with HHPT, has prosthesis but needs to be refitted so hasn't been using     Hand Dominance        Extremity/Trunk Assessment  Upper Extremity Assessment Upper Extremity Assessment: Overall WFL for tasks assessed    Lower Extremity Assessment Lower Extremity Assessment: RLE deficits/detail;LLE deficits/detail RLE Deficits / Details: AROM WNL, strength grossly 3/5, denies  numbness/tingling RLE Sensation: WNL LLE Deficits / Details: L AKA, "sore" at incision site    Cervical / Trunk Assessment Cervical / Trunk Assessment: Normal  Communication   Communication: No difficulties  Cognition Arousal/Alertness: Awake/alert Behavior During Therapy: WFL for tasks assessed/performed Overall Cognitive Status: Within Functional Limits for tasks assessed       General Comments      Exercises     Assessment/Plan    PT Assessment Patient needs continued PT services  PT Problem List Decreased strength;Decreased activity tolerance;Decreased balance;Decreased knowledge of use of DME;Decreased safety awareness;Decreased skin integrity       PT Treatment Interventions DME instruction;Functional mobility training;Therapeutic activities;Therapeutic exercise;Balance training;Neuromuscular re-education;Patient/family education;Wheelchair mobility training    PT Goals (Current goals can be found in the Care Plan section)  Acute Rehab PT Goals Patient Stated Goal: return home PT Goal Formulation: With patient/family Time For Goal Achievement: 03/07/20 Potential to Achieve Goals: Good    Frequency Min 2X/week   Barriers to discharge        Co-evaluation               AM-PAC PT "6 Clicks" Mobility  Outcome Measure Help needed turning from your back to your side while in a flat bed without using bedrails?: A Little Help needed moving from lying on your back to sitting on the side of a flat bed without using bedrails?: A Little Help needed moving to and from a bed to a chair (including a wheelchair)?: Total Help needed standing up from a chair using your arms (e.g., wheelchair or bedside chair)?: Total Help needed to walk in hospital room?: Total Help needed climbing 3-5 steps with a railing? : Total 6 Click Score: 10    End of Session   Activity Tolerance: Patient tolerated treatment well Patient left: in bed;with call bell/phone within reach;with  nursing/sitter in room;with family/visitor present Nurse Communication: Mobility status PT Visit Diagnosis: Other abnormalities of gait and mobility (R26.89);Muscle weakness (generalized) (M62.81)    Time: 4818-5631 PT Time Calculation (min) (ACUTE ONLY): 28 min   Charges:   PT Evaluation $PT Eval Moderate Complexity: 1 Mod           Tori Kishaun Erekson PT, DPT 02/22/20, 10:33 AM [phone]

## 2020-02-22 NOTE — Plan of Care (Signed)
  Problem: Acute Rehab PT Goals(only PT should resolve) Goal: Pt Will Go Supine/Side To Sit Outcome: Progressing Flowsheets (Taken 02/22/2020 1034) Pt will go Supine/Side to Sit: with modified independence Goal: Pt Will Go Sit To Supine/Side Outcome: Progressing Flowsheets (Taken 02/22/2020 1034) Pt will go Sit to Supine/Side: with modified independence Goal: Patient Will Transfer Sit To/From Stand Outcome: Progressing Flowsheets (Taken 02/22/2020 1034) Patient will transfer sit to/from stand:  with moderate assist  from elevated surface Goal: Pt Will Transfer Bed To Chair/Chair To Bed Outcome: Progressing Flowsheets (Taken 02/22/2020 1034) Pt will Transfer Bed to Chair/Chair to Bed:  with mod assist  with +2   Tori Charlotte Fidalgo PT, DPT 02/22/20, 10:35 AM [phone]

## 2020-02-22 NOTE — TOC Transition Note (Signed)
Transition of Care [facility]) - CM/SW Discharge Note   Patient Details  Name: Warren Mcdonald MRN: [id number] Date of Birth: 1930-02-27  Transition of Care [facility]) CM/SW Contact:  Villa Herb, LCSWA Phone Number: 02/22/2020, 3:22 PM   Clinical Narrative:    CSW informed to set up [facility] PT/RN for pt at discharge. CSW contact Bonita Quin with [facility]. Bonita Quin accepted referral. Pt does not currently have a PCP. Dr. Kerry Hough will sign orders for [facility] PT/RN. CSW provided pts wife Pattricia Boss with PCP resources and informed pt and family of importance of finding pt a PCP. Family reports pt is in need of new extra wide beside commode and larger wheelchair. CSW contacted Thereasa Distance with [facility] to make referral. Thereasa Distance updated TOC that wheelchairs are on backorder and pt would need to contact PCP once found about wheelchair. Thereasa Distance also updated CSW that due to pts weight he does not qualify for insurance to cover extra wide BSC, would need to pay out of pocket ($145). CSW called to update pts wife about new information from [place]. Pts wife still had concerns, Thereasa Distance called to speak with pts wife. Thereasa Distance updated CSW that order for [facility] has been processed and he has made his office aware of private pay and that pts wife will follow up with PCP concerning wheelchair. TOC signing off.   Final next level of care: Home w Home Health Services Barriers to Discharge: Barriers Resolved   Patient Goals and CMS Choice Patient states their goals for this hospitalization and ongoing recovery are:: Return home with [facility].      Discharge Placement                       Discharge Plan and Services                DME Arranged: Bedside commode DME Agency: [facility] Date DME Agency Contacted: 02/22/20 Time DME Agency Contacted: 1522 Representative spoke with at DME Agency: Thereasa Distance HH Arranged: PT, OT [facility] Agency: [facility] Date HH Agency Contacted: 02/22/20 Time HH Agency Contacted: 1522 Representative  spoke with at [facility] Agency: Bonita Quin  Social Determinants of Health (SDOH) Interventions     Readmission Risk Interventions No flowsheet data found.

## 2020-02-23 ENCOUNTER — Other Ambulatory Visit: Payer: Self-pay

## 2020-02-23 ENCOUNTER — Other Ambulatory Visit: Payer: Self-pay | Admitting: *Deleted

## 2020-02-23 NOTE — Patient Outreach (Signed)
[facility]) Care Management  02/23/2020  MONNIE ANSPACH October 26, 1929 [id number]   Referral received from hospital liaison as member was discharged 9/28 after diagnosis of heart failure exacerbation.  Call placed to member for assessment of needs, no answer.  HIPAA compliant voice message left.  Will send unsuccessful letter and follow up within the next 3-4 business days.  Kemper Durie, [place], MSN [facility] Care Management  [facility] Manager [phone]

## 2020-02-24 DIAGNOSIS — I5032 Chronic diastolic (congestive) heart failure: Secondary | ICD-10-CM | POA: Diagnosis not present

## 2020-02-24 DIAGNOSIS — Z4781 Encounter for orthopedic aftercare following surgical amputation: Secondary | ICD-10-CM | POA: Diagnosis not present

## 2020-02-24 DIAGNOSIS — I110 Hypertensive heart disease with heart failure: Secondary | ICD-10-CM | POA: Diagnosis not present

## 2020-02-24 DIAGNOSIS — Z89612 Acquired absence of left leg above knee: Secondary | ICD-10-CM | POA: Diagnosis not present

## 2020-02-24 DIAGNOSIS — E1151 Type 2 diabetes mellitus with diabetic peripheral angiopathy without gangrene: Secondary | ICD-10-CM | POA: Diagnosis not present

## 2020-02-24 DIAGNOSIS — Z44122 Encounter for fitting and adjustment of partial artificial left leg: Secondary | ICD-10-CM | POA: Diagnosis not present

## 2020-02-29 ENCOUNTER — Telehealth: Payer: Self-pay | Admitting: Cardiology

## 2020-02-29 ENCOUNTER — Other Ambulatory Visit: Payer: Self-pay | Admitting: *Deleted

## 2020-02-29 DIAGNOSIS — I110 Hypertensive heart disease with heart failure: Secondary | ICD-10-CM | POA: Diagnosis not present

## 2020-02-29 DIAGNOSIS — Z89612 Acquired absence of left leg above knee: Secondary | ICD-10-CM | POA: Diagnosis not present

## 2020-02-29 DIAGNOSIS — E1151 Type 2 diabetes mellitus with diabetic peripheral angiopathy without gangrene: Secondary | ICD-10-CM | POA: Diagnosis not present

## 2020-02-29 DIAGNOSIS — I5032 Chronic diastolic (congestive) heart failure: Secondary | ICD-10-CM | POA: Diagnosis not present

## 2020-02-29 DIAGNOSIS — Z44122 Encounter for fitting and adjustment of partial artificial left leg: Secondary | ICD-10-CM | POA: Diagnosis not present

## 2020-02-29 DIAGNOSIS — Z4781 Encounter for orthopedic aftercare following surgical amputation: Secondary | ICD-10-CM | POA: Diagnosis not present

## 2020-02-29 MED ORDER — ATORVASTATIN CALCIUM 20 MG PO TABS
20.0000 mg | ORAL_TABLET | Freq: Every day | ORAL | 6 refills | Status: DC
Start: 2020-02-29 — End: 2020-03-07

## 2020-02-29 NOTE — Telephone Encounter (Signed)
Per [facility] [phone] Pt missing meds No Family Physician as of today Please call in cholesterol meds Wound on leg/need wound care orders     Thanks renee

## 2020-02-29 NOTE — Patient Outreach (Signed)
[facility]) Care Management  02/29/2020  Warren Mcdonald 1929-11-23 [id number]   Outreach attempt #2, unsuccessful.  Referral received from hospital liaison as member was discharged 9/28 after diagnosis of heart failure exacerbation.  Call placed to member for assessment of needs, no answer.  HIPAA compliant voice message left.  Will follow up within the next 3-4 business days.  Kemper Durie, [place], MSN [facility] Care Management  [facility] Manager [phone]

## 2020-02-29 NOTE — Telephone Encounter (Signed)
I spoke with Warren Mcdonald, pt needs refill for Lipitor and ask Dr.Branch if he will write rx for [employer]

## 2020-03-02 ENCOUNTER — Telehealth: Payer: Self-pay

## 2020-03-02 NOTE — Telephone Encounter (Signed)
I left  message telling [name] states Warren Mcdonald was ok by him.

## 2020-03-02 NOTE — Telephone Encounter (Signed)
Warren Mcdonald from [facility] called for [employer], but was already approved by Dr. Wyline Mood. Told her to call us back if there was anything we could do to assist [name].

## 2020-03-02 NOTE — Telephone Encounter (Signed)
Ok to write for lipitor and Print production planner   Dominga Ferry MD

## 2020-03-03 DIAGNOSIS — I5032 Chronic diastolic (congestive) heart failure: Secondary | ICD-10-CM | POA: Diagnosis not present

## 2020-03-03 DIAGNOSIS — Z89612 Acquired absence of left leg above knee: Secondary | ICD-10-CM | POA: Diagnosis not present

## 2020-03-03 DIAGNOSIS — E1151 Type 2 diabetes mellitus with diabetic peripheral angiopathy without gangrene: Secondary | ICD-10-CM | POA: Diagnosis not present

## 2020-03-03 DIAGNOSIS — I110 Hypertensive heart disease with heart failure: Secondary | ICD-10-CM | POA: Diagnosis not present

## 2020-03-03 DIAGNOSIS — Z4781 Encounter for orthopedic aftercare following surgical amputation: Secondary | ICD-10-CM | POA: Diagnosis not present

## 2020-03-03 DIAGNOSIS — Z44122 Encounter for fitting and adjustment of partial artificial left leg: Secondary | ICD-10-CM | POA: Diagnosis not present

## 2020-03-05 DIAGNOSIS — Z7901 Long term (current) use of anticoagulants: Secondary | ICD-10-CM | POA: Diagnosis not present

## 2020-03-05 DIAGNOSIS — S80821D Blister (nonthermal), right lower leg, subsequent encounter: Secondary | ICD-10-CM | POA: Diagnosis not present

## 2020-03-05 DIAGNOSIS — Z89612 Acquired absence of left leg above knee: Secondary | ICD-10-CM | POA: Diagnosis not present

## 2020-03-05 DIAGNOSIS — E1151 Type 2 diabetes mellitus with diabetic peripheral angiopathy without gangrene: Secondary | ICD-10-CM | POA: Diagnosis not present

## 2020-03-05 DIAGNOSIS — Z4800 Encounter for change or removal of nonsurgical wound dressing: Secondary | ICD-10-CM | POA: Diagnosis not present

## 2020-03-05 DIAGNOSIS — I110 Hypertensive heart disease with heart failure: Secondary | ICD-10-CM | POA: Diagnosis not present

## 2020-03-05 DIAGNOSIS — I480 Paroxysmal atrial fibrillation: Secondary | ICD-10-CM | POA: Diagnosis not present

## 2020-03-05 DIAGNOSIS — I5032 Chronic diastolic (congestive) heart failure: Secondary | ICD-10-CM | POA: Diagnosis not present

## 2020-03-06 ENCOUNTER — Other Ambulatory Visit: Payer: Self-pay | Admitting: *Deleted

## 2020-03-06 NOTE — Patient Outreach (Addendum)
[facility]) Care Management  03/06/2020  MARZELL ISAKSON [birth year] [id number]   Outreach attempt #3, unsuccessful.  Referral received from hospital liaison as member was discharged 9/28 after diagnosis of heart failure exacerbation. Call placed to member for assessment of needs.  Wife answered phone but state she was unable to talk at this time.  Will await call back, if no call back will follow up within the next 3 weeks.  If remain unsuccessful, will close case due to inability to establish contact.  Kemper Durie, [place], MSN [facility] Care Management  [facility] Manager [phone]

## 2020-03-07 ENCOUNTER — Encounter: Payer: Self-pay | Admitting: Internal Medicine

## 2020-03-07 ENCOUNTER — Other Ambulatory Visit: Payer: Self-pay

## 2020-03-07 ENCOUNTER — Encounter: Payer: Self-pay | Admitting: *Deleted

## 2020-03-07 ENCOUNTER — Ambulatory Visit (INDEPENDENT_AMBULATORY_CARE_PROVIDER_SITE_OTHER): Payer: Medicare Other | Admitting: Internal Medicine

## 2020-03-07 ENCOUNTER — Other Ambulatory Visit: Payer: Self-pay | Admitting: *Deleted

## 2020-03-07 VITALS — BP 124/86 | HR 61 | Temp 97.0°F | Resp 18 | Ht 73.0 in | Wt 202.0 lb

## 2020-03-07 DIAGNOSIS — I5020 Unspecified systolic (congestive) heart failure: Secondary | ICD-10-CM

## 2020-03-07 DIAGNOSIS — Z4800 Encounter for change or removal of nonsurgical wound dressing: Secondary | ICD-10-CM | POA: Diagnosis not present

## 2020-03-07 DIAGNOSIS — I4891 Unspecified atrial fibrillation: Secondary | ICD-10-CM | POA: Diagnosis not present

## 2020-03-07 DIAGNOSIS — N183 Chronic kidney disease, stage 3 unspecified: Secondary | ICD-10-CM | POA: Insufficient documentation

## 2020-03-07 DIAGNOSIS — Z7689 Persons encountering health services in other specified circumstances: Secondary | ICD-10-CM | POA: Diagnosis not present

## 2020-03-07 DIAGNOSIS — I10 Essential (primary) hypertension: Secondary | ICD-10-CM

## 2020-03-07 DIAGNOSIS — K5900 Constipation, unspecified: Secondary | ICD-10-CM | POA: Diagnosis not present

## 2020-03-07 DIAGNOSIS — K5904 Chronic idiopathic constipation: Secondary | ICD-10-CM | POA: Diagnosis not present

## 2020-03-07 DIAGNOSIS — E119 Type 2 diabetes mellitus without complications: Secondary | ICD-10-CM

## 2020-03-07 DIAGNOSIS — E1151 Type 2 diabetes mellitus with diabetic peripheral angiopathy without gangrene: Secondary | ICD-10-CM | POA: Diagnosis not present

## 2020-03-07 DIAGNOSIS — S80821D Blister (nonthermal), right lower leg, subsequent encounter: Secondary | ICD-10-CM | POA: Diagnosis not present

## 2020-03-07 DIAGNOSIS — I739 Peripheral vascular disease, unspecified: Secondary | ICD-10-CM

## 2020-03-07 DIAGNOSIS — E785 Hyperlipidemia, unspecified: Secondary | ICD-10-CM | POA: Diagnosis not present

## 2020-03-07 DIAGNOSIS — I480 Paroxysmal atrial fibrillation: Secondary | ICD-10-CM | POA: Diagnosis not present

## 2020-03-07 DIAGNOSIS — N1832 Chronic kidney disease, stage 3b: Secondary | ICD-10-CM | POA: Diagnosis not present

## 2020-03-07 DIAGNOSIS — I110 Hypertensive heart disease with heart failure: Secondary | ICD-10-CM | POA: Diagnosis not present

## 2020-03-07 DIAGNOSIS — I5032 Chronic diastolic (congestive) heart failure: Secondary | ICD-10-CM | POA: Diagnosis not present

## 2020-03-07 MED ORDER — LUBIPROSTONE 24 MCG PO CAPS: 24.0000 ug | ORAL_CAPSULE | Freq: Two times a day (BID) | ORAL | 3 refills | Status: DC

## 2020-03-07 MED ORDER — SENNA 8.6 MG PO TABS: 1.0000 | ORAL_TABLET | Freq: Every day | ORAL | 0 refills | Status: DC | PRN

## 2020-03-07 MED ORDER — ATORVASTATIN CALCIUM 20 MG PO TABS: 20.0000 mg | ORAL_TABLET | Freq: Every day | ORAL | 6 refills | Status: DC

## 2020-03-07 NOTE — Assessment & Plan Note (Addendum)
Echo (06/2019): LVEF 40-45%. On Metoprolol and Lasix Recent hospitalization for CHF exacerbation Was on Losartan, was recently discontinued due to poor kidney function Follows up with Cardiologist

## 2020-03-07 NOTE — Progress Notes (Signed)
New Patient Office Visit  Subjective:  Patient ID: Warren Mcdonald, male    DOB: [birth year]  Age: 86 y.o. MRN: [id number]  CC:  Chief Complaint  Patient presents with  . New Patient (Initial Visit)    new pt has had tight feeling in his stomach also has not had a good bm for 2 weeks sinsuses has been draining also for a long time     HPI Warren Mcdonald is a 86 year old male with PMH of [name], hypertension, paroxysmal atrial fibrillation, CKD, diabetes mellitus, peripheral vascular disease, s/p left AKA and chronic constipation presents for establishing care.  Patient was recently hospitalized for acute on chronic CHF.  Patient had likely cardiorenal syndrome during the hospitalization.  Since discharge, patient has been taking Lasix 40 mg once daily.  Losartan and Metformin were discontinued because of AKI on CKD.  Patient was started on Tradjenta for diabetes, which she has not been able to pick up because of the cost.  Currently, patient denies any chest pain, shortness of breath, palpitations, dizziness or lightheadedness. Patient follows up with cardiologist for management of CHF.  Patient has a history of chronic constipation and used to take Amitiza.  Patient is currently not taking any medication to help with the constipation.  He states that he has only once or twice bowel movement in a week.  He complains of abdominal distention, but denies nausea, vomiting, fever, chills, melena or hematochezia.  Patient has a history of paroxysmal atrial fibrillation, for which he has been on metoprolol and Eliquis.  His Eliquis dose was decreased during the hospitalization due to AKI on CKD.  Has not had COVID vaccine and states that he is still thinking. Patient was advised to take the COVID vaccine considering his medical history.  Past Medical History:  Diagnosis Date  . Abdominal distension (gaseous)   . Anemia, unspecified   . Chronic diastolic (congestive) heart failure (HCC)   .  Essential (primary) hypertension   . Hyperlipidemia   . PAF (paroxysmal atrial fibrillation) (HCC)   . Peripheral vascular disease (HCC) 2011   Left above-knee popliteal to posterior tibial artery bypass   . PUD (peptic ulcer disease)   . Type 2 diabetes mellitus with diabetic peripheral angiopathy without gangrene [facility])     Past Surgical History:  Procedure Laterality Date  . ABDOMINAL AORTOGRAM N/A 05/24/2019   Procedure: ABDOMINAL AORTOGRAM;  Surgeon: Maeola Harman, MD;  Location: [facility] INVASIVE CV LAB;  Service: Cardiovascular;  Laterality: N/A;  . AMPUTATION Left 06/16/2019   Procedure: AMPUTATION DIGIT FOURTH TOE LEFT FOOT;  Surgeon: Nada Libman, MD;  Location: [facility] OR;  Service: Vascular;  Laterality: Left;  . AMPUTATION Left 06/26/2019   Procedure: AMPUTATION two DIGITs, left toe;  Surgeon: Maeola Harman, MD;  Location: [facility] OR;  Service: Vascular;  Laterality: Left;  . AMPUTATION Left 07/02/2019   Procedure: left AMPUTATION ABOVE KNEE;  Surgeon: Larina Earthly, MD;  Location: [facility] OR;  Service: Vascular;  Laterality: Left;  . APPENDECTOMY    . CATARACT EXTRACTION W/ INTRAOCULAR LENS  IMPLANT, BILATERAL    . CHOLECYSTECTOMY N/A 09/08/2014   Procedure: CHOLECYSTECTOMY;  Surgeon: Franky Macho Md, MD;  Location: [facility] ORS;  Service: General;  Laterality: N/A;  . EYE SURGERY    . I & D EXTREMITY Left 06/30/2019   Procedure: sharp incisonal DEBRIDEMENT of left foot including bone, tendon, muscle, and skin;  Surgeon: Cephus Shelling, MD;  Location: [facility] OR;  Service:  Vascular;  Laterality: Left;  . LOWER EXTREMITY ANGIOGRAPHY Left 05/24/2019   Procedure: Lower Extremity Angiography;  Surgeon: Maeola Harman, MD;  Location: [facility] INVASIVE CV LAB;  Service: Cardiovascular;  Laterality: Left;  . PERIPHERAL VASCULAR BALLOON ANGIOPLASTY Left 05/24/2019   Procedure: PERIPHERAL VASCULAR BALLOON ANGIOPLASTY;  Surgeon: Maeola Harman, MD;  Location: [facility] INVASIVE CV  LAB;  Service: Cardiovascular;  Laterality: Left;  tibial bypass  . PR VEIN BYPASS GRAFT,AORTO-FEM-POP     Left above knee popliteal to posterior tibial artery bypass graft    Family History  Problem Relation Age of Onset  . Heart disease Mother   . Hypertension Mother   . Heart attack Mother   . Heart attack Father   . Heart disease Father        Heart disease before age 5  . Cancer Sister   . Deep vein thrombosis Sister   . Diabetes Sister        Amputation-right leg  . Cancer Daughter   . Diabetes Daughter   . Cancer Brother   . Diabetes Other   . Prostate cancer Other     Social History   Socioeconomic History  . Marital status: Married    Spouse name: Not on file  . Number of children: Not on file  . Years of education: Not on file  . Highest education level: Not on file  Occupational History  . Not on file  Tobacco Use  . Smoking status: Former Smoker    Types: Cigars    Quit date: 05/27/1988    Years since quitting: 31.8  . Smokeless tobacco: Former Neurosurgeon    Types: Chew    Quit date: 05/27/1988  Vaping Use  . Vaping Use: Never used  Substance and Sexual Activity  . Alcohol use: No    Alcohol/week: 0.0 standard drinks  . Drug use: No  . Sexual activity: Not on file  Other Topics Concern  . Not on file  Social History Narrative  . Not on file   Social Determinants of Health   Financial Resource Strain:   . Difficulty of Paying Living Expenses: Not on file  Food Insecurity: No Food Insecurity  . Worried About Programme researcher, broadcasting/film/video in the Last Year: Never true  . Ran Out of Food in the Last Year: Never true  Transportation Needs: No Transportation Needs  . Lack of Transportation (Medical): No  . Lack of Transportation (Non-Medical): No  Physical Activity:   . Days of Exercise per Week: Not on file  . Minutes of Exercise per Session: Not on file  Stress:   . Feeling of Stress : Not on file  Social Connections:   . Frequency of Communication with  Friends and Family: Not on file  . Frequency of Social Gatherings with Friends and Family: Not on file  . Attends Religious Services: Not on file  . Active Member of Clubs or Organizations: Not on file  . Attends Banker Meetings: Not on file  . Marital Status: Not on file  Intimate Partner Violence:   . Fear of Current or Ex-Partner: Not on file  . Emotionally Abused: Not on file  . Physically Abused: Not on file  . Sexually Abused: Not on file    ROS Review of Systems  Constitutional: Negative for chills and fever.  HENT: Negative for congestion and sore throat.   Eyes: Negative for pain and discharge.  Respiratory: Negative for cough and shortness of breath.  Cardiovascular: Negative for chest pain and palpitations.  Gastrointestinal: Positive for abdominal distention and constipation. Negative for abdominal pain, blood in stool, diarrhea, nausea and vomiting.  Endocrine: Negative for polydipsia and polyuria.  Genitourinary: Negative for dysuria and hematuria.  Musculoskeletal: Negative for neck pain and neck stiffness.  Skin: Negative for rash.  Neurological: Negative for dizziness, weakness, numbness and headaches.  Psychiatric/Behavioral: Negative for agitation and behavioral problems.    Objective:   Today's Vitals: BP 124/86 (BP Location: Right Arm, Patient Position: Sitting, Cuff Size: Normal)   Pulse 61   Temp (!) 97 F (36.1 C) (Temporal)   Resp 18   Ht 6\' 1"  (1.854 m)   Wt 202 lb (91.6 kg)   SpO2 98%   BMI 26.65 kg/m   Physical Exam Vitals reviewed.  Constitutional:      General: He is not in acute distress.    Appearance: He is not diaphoretic.  HENT:     Head: Normocephalic and atraumatic.     Nose: Nose normal.     Mouth/Throat:     Mouth: Mucous membranes are moist.  Eyes:     General: No scleral icterus.    Extraocular Movements: Extraocular movements intact.     Pupils: Pupils are equal, round, and reactive to light.    Cardiovascular:     Rate and Rhythm: Normal rate and regular rhythm.     Pulses: Normal pulses.     Heart sounds: Normal heart sounds. No murmur heard.  No friction rub.  Pulmonary:     Breath sounds: Normal breath sounds. No wheezing or rales.  Abdominal:     Palpations: Abdomen is soft.     Tenderness: There is no abdominal tenderness.  Musculoskeletal:     Cervical back: Neck supple. No tenderness.     Right lower leg: No edema.     Left lower leg: No edema.  Skin:    General: Skin is warm.     Findings: No rash.  Neurological:     General: No focal deficit present.     Mental Status: He is alert and oriented to person, place, and time.  Psychiatric:        Mood and Affect: Mood normal.        Behavior: Behavior normal.     Assessment & Plan:   Problem List Items Addressed This Visit    Encounter to establish care Care established Previous chart reviewed History and medications reviewed with the patient  Peripheral vascular disease S/p left AKA On Aspirin 81 mg and Atorvastatin 20 mg QD  PAF (paroxysmal atrial fibrillation) (HCC) Rate controlled with Metoprolol 25 mg BID On Eliquis 2.5 mg BID  DM (diabetes mellitus) HbA1C: 7.4 (01/2020) Was on Metformin, was switched to Tradjenta recently, but has not been able to take it due to cost Advised to follow diabetic diet F/u CMP, if kidney function better, plan to restart Metformin. Otherwise, will try Tradjenta Diabetic foot exam: Today Diabetic eye exam: Advised to follow up with Ophthalmology for diabetic eye exam   [name] (heart failure with reduced ejection fraction) (HCC) Echo (06/2019): LVEF 40-45%. On Metoprolol and Lasix Recent hospitalization for CHF exacerbation Was on Losartan, was recently discontinued due to poor kidney function Follows up with Cardiologist  Essential (primary) hypertension Well-controlled with Metoprolol currently Was on Losartan, plan to restart depending on CMP Advised low  salt diet  Constipation Was on Amitiza, will restart Senokot PRN  CKD (chronic kidney disease) stage 3, GFR 30-59  ml/min (HCC) Recent CMP in the hospital shows AKI on CKD Will repeat CMP Avoid nephrotoxic agents    Outpatient Encounter Medications as of 03/07/2020  Medication Sig  . apixaban (ELIQUIS) 2.5 MG TABS tablet Take 1 tablet (2.5 mg total) by mouth 2 (two) times daily.  . furosemide (LASIX) 40 MG tablet Take 1 tablet (40 mg total) by mouth daily.  . metoprolol tartrate (LOPRESSOR) 25 MG tablet Take 1 tablet (25 mg total) by mouth 2 (two) times daily.  . potassium chloride 20 MEQ TBCR Take 20 mEq by mouth daily.  Marland Kitchen atorvastatin (LIPITOR) 20 MG tablet Take 1 tablet (20 mg total) by mouth daily at 6 PM.  . lubiprostone (AMITIZA) 24 MCG capsule Take 1 capsule (24 mcg total) by mouth 2 (two) times daily with a meal.  . senna (SENOKOT) 8.6 MG TABS tablet Take 1 tablet (8.6 mg total) by mouth daily as needed for mild constipation.  . [DISCONTINUED] atorvastatin (LIPITOR) 20 MG tablet Take 1 tablet (20 mg total) by mouth daily at 6 PM. (Patient not taking: Reported on 03/07/2020)  . [DISCONTINUED] HYDROcodone-acetaminophen (NORCO/VICODIN) 5-325 MG tablet Take 1 tablet by mouth every 4 (four) hours as needed for moderate pain. (Patient not taking: Reported on 02/20/2020)  . [DISCONTINUED] linagliptin (TRADJENTA) 5 MG TABS tablet Take 1 tablet (5 mg total) by mouth daily. (Patient not taking: Reported on 03/07/2020)  . [DISCONTINUED] lubiprostone (AMITIZA) 24 MCG capsule Take 24 mcg by mouth 2 (two) times daily with a meal. (Patient not taking: Reported on 03/07/2020)   No facility-administered encounter medications on file as of 03/07/2020.    Follow-up: Return in about 6 weeks (around 04/18/2020).   Anabel Halon, MD

## 2020-03-07 NOTE — Assessment & Plan Note (Signed)
Care established Previous chart reviewed History and medications reviewed with the patient 

## 2020-03-07 NOTE — Assessment & Plan Note (Addendum)
Well-controlled with Metoprolol currently Was on Losartan, plan to restart depending on CMP Advised low salt diet

## 2020-03-07 NOTE — Patient Outreach (Addendum)
[facility]) Care Management  03/07/2020  Warren Mcdonald 27-Feb-1930 [id number]    Incoming call from wife after multiple unsuccessful outreach attempts.  Identity verified.  This care manager introduced self and stated purpose of call.  [facility] care management services explained.    Social: Member lives with wife Pattricia Boss, she report she is still working outside of the home.  She helps member to get bathed, dressed, and prepares meals prior to her leaving for work.    Conditions: Per chart, has history of PAD, AKA, DM, HTN, CHF, CKD, and HLD.  He does not monitor weights due to the AKA.  Has a prosthetic leg but not using currently due to blister on stump.  Working with home health PT for strength training.  She report she monitors salt intake to manage fluid levels.  Medications: Reviewed with wife, member does not have all medications prescribed at discharge, will take medication list and medications that he currently have in the home with them to MD appointment for reconciliation.   Appointments: Has follow up appointment with new PCP today, wife and granddaughter will provide transportation.  Will have follow up with cardiology on 11/1.    Plan: RN CM will follow up with member/wife within the next 2 weeks.  Goals Addressed            This Visit's Progress   . [facility] - Track and Manage Fluids and Swelling       Follow Up Date 11/12   - keep legs up while sitting - use salt in moderation - watch for swelling in feet, ankles and legs every day    Why is this important?   It is important to check your weight daily and watch how much salt and liquids you have.  It will help you to manage your heart failure.    Notes:     . [facility] - Track and Manage Symptoms       Follow Up Date 12/12   - begin a heart failure diary - bring diary to all appointments - eat more whole grains, fruits and vegetables, lean meats and healthy fats - follow rescue plan if symptoms  flare-up - know when to call the doctor    Why is this important?   You will be able to handle your symptoms better if you keep track of them.  Making some simple changes to your lifestyle will help.  Eating healthy is one thing you can do to take good care of yourself.    Notes:       Kemper Durie, RN, MSN [facility] Care Management  [facility] Manager [phone]

## 2020-03-07 NOTE — Assessment & Plan Note (Signed)
Recent CMP in the hospital shows AKI on CKD Will repeat CMP Avoid nephrotoxic agents

## 2020-03-07 NOTE — Assessment & Plan Note (Signed)
HbA1C: 7.4 (01/2020) Was on Metformin, was switched to Tradjenta recently, but has not been able to take it due to cost Advised to follow diabetic diet F/u CMP, if kidney function better, plan to restart Metformin. Otherwise, will try Tradjenta Diabetic foot exam: Today Diabetic eye exam: Advised to follow up with Ophthalmology for diabetic eye exam

## 2020-03-07 NOTE — Assessment & Plan Note (Signed)
S/p left AKA On Aspirin 81 mg and Atorvastatin 20 mg QD

## 2020-03-07 NOTE — Assessment & Plan Note (Signed)
Was on Amitiza, will restart Senokot PRN

## 2020-03-07 NOTE — Assessment & Plan Note (Signed)
Rate controlled with Metoprolol 25 mg BID On Eliquis 2.5 mg BID

## 2020-03-07 NOTE — Patient Instructions (Signed)
Please get blood tests done tomorrow before breakfast.  Please start taking Atorvastatin for cholesterol. Please start taking Amitiza for constipation. You are also prescribed Senokot as needed for constipation. Do not take Senokot if you have loose stool.  Please continue to take Metoprolol, Furosemide, Eliquis and Aspirin as prescribed.  You are advised to follow up with Ophthalmologist for annual eye checkup.

## 2020-03-08 DIAGNOSIS — I4891 Unspecified atrial fibrillation: Secondary | ICD-10-CM | POA: Diagnosis not present

## 2020-03-08 DIAGNOSIS — E1151 Type 2 diabetes mellitus with diabetic peripheral angiopathy without gangrene: Secondary | ICD-10-CM | POA: Diagnosis not present

## 2020-03-09 ENCOUNTER — Other Ambulatory Visit: Payer: Self-pay | Admitting: Internal Medicine

## 2020-03-09 DIAGNOSIS — E1151 Type 2 diabetes mellitus with diabetic peripheral angiopathy without gangrene: Secondary | ICD-10-CM

## 2020-03-09 MED ORDER — METFORMIN HCL 500 MG PO TABS: 500.0000 mg | ORAL_TABLET | Freq: Two times a day (BID) | ORAL | 1 refills | Status: DC

## 2020-03-10 DIAGNOSIS — I110 Hypertensive heart disease with heart failure: Secondary | ICD-10-CM | POA: Diagnosis not present

## 2020-03-10 DIAGNOSIS — S80821D Blister (nonthermal), right lower leg, subsequent encounter: Secondary | ICD-10-CM | POA: Diagnosis not present

## 2020-03-10 DIAGNOSIS — Z4800 Encounter for change or removal of nonsurgical wound dressing: Secondary | ICD-10-CM | POA: Diagnosis not present

## 2020-03-10 DIAGNOSIS — E1151 Type 2 diabetes mellitus with diabetic peripheral angiopathy without gangrene: Secondary | ICD-10-CM | POA: Diagnosis not present

## 2020-03-10 DIAGNOSIS — I480 Paroxysmal atrial fibrillation: Secondary | ICD-10-CM | POA: Diagnosis not present

## 2020-03-10 DIAGNOSIS — I5032 Chronic diastolic (congestive) heart failure: Secondary | ICD-10-CM | POA: Diagnosis not present

## 2020-03-10 LAB — CMP14+EGFR
ALT: 12 IU/L (ref 0–44)
AST: 14 IU/L (ref 0–40)
Albumin/Globulin Ratio: 1.2 (ref 1.2–2.2)
Albumin: 4.2 g/dL (ref 3.6–4.6)
Alkaline Phosphatase: 125 IU/L — ABNORMAL HIGH (ref 44–121)
BUN/Creatinine Ratio: 13 (ref 10–24)
BUN: 22 mg/dL (ref 8–27)
Bilirubin Total: 1.3 mg/dL — ABNORMAL HIGH (ref 0.0–1.2)
CO2: 22 mmol/L (ref 20–29)
Calcium: 10.2 mg/dL (ref 8.6–10.2)
Chloride: 99 mmol/L (ref 96–106)
Creatinine, Ser: 1.64 mg/dL — ABNORMAL HIGH (ref 0.76–1.27)
GFR calc Af Amer: 43 mL/min/{1.73_m2} — ABNORMAL LOW (ref >59)
GFR calc non Af Amer: 37 mL/min/{1.73_m2} — ABNORMAL LOW (ref >59)
Globulin, Total: 3.4 g/dL (ref 1.5–4.5)
Glucose: 170 mg/dL — ABNORMAL HIGH (ref 65–99)
Potassium: 5.1 mmol/L (ref 3.5–5.2)
Sodium: 137 mmol/L (ref 134–144)
Total Protein: 7.6 g/dL (ref 6.0–8.5)

## 2020-03-10 LAB — CBC
Hematocrit: 41 % (ref 37.5–51.0)
Hemoglobin: 13.5 g/dL (ref 13.0–17.7)
MCH: 30 pg (ref 26.6–33.0)
MCHC: 32.9 g/dL (ref 31.5–35.7)
MCV: 91 fL (ref 79–97)
Platelets: 252 10*3/uL (ref 150–450)
RBC: 4.5 x10E6/uL (ref 4.14–5.80)
RDW: 14 % (ref 11.6–15.4)
WBC: 4.5 10*3/uL (ref 3.4–10.8)

## 2020-03-10 LAB — MAGNESIUM: Magnesium: 1.9 mg/dL (ref 1.6–2.3)

## 2020-03-13 DIAGNOSIS — E1151 Type 2 diabetes mellitus with diabetic peripheral angiopathy without gangrene: Secondary | ICD-10-CM | POA: Diagnosis not present

## 2020-03-13 DIAGNOSIS — I5032 Chronic diastolic (congestive) heart failure: Secondary | ICD-10-CM | POA: Diagnosis not present

## 2020-03-13 DIAGNOSIS — I110 Hypertensive heart disease with heart failure: Secondary | ICD-10-CM | POA: Diagnosis not present

## 2020-03-13 DIAGNOSIS — Z4800 Encounter for change or removal of nonsurgical wound dressing: Secondary | ICD-10-CM | POA: Diagnosis not present

## 2020-03-13 DIAGNOSIS — S80821D Blister (nonthermal), right lower leg, subsequent encounter: Secondary | ICD-10-CM | POA: Diagnosis not present

## 2020-03-13 DIAGNOSIS — I480 Paroxysmal atrial fibrillation: Secondary | ICD-10-CM | POA: Diagnosis not present

## 2020-03-16 DIAGNOSIS — I5032 Chronic diastolic (congestive) heart failure: Secondary | ICD-10-CM | POA: Diagnosis not present

## 2020-03-16 DIAGNOSIS — I480 Paroxysmal atrial fibrillation: Secondary | ICD-10-CM | POA: Diagnosis not present

## 2020-03-16 DIAGNOSIS — I110 Hypertensive heart disease with heart failure: Secondary | ICD-10-CM | POA: Diagnosis not present

## 2020-03-16 DIAGNOSIS — S80821D Blister (nonthermal), right lower leg, subsequent encounter: Secondary | ICD-10-CM | POA: Diagnosis not present

## 2020-03-16 DIAGNOSIS — Z4800 Encounter for change or removal of nonsurgical wound dressing: Secondary | ICD-10-CM | POA: Diagnosis not present

## 2020-03-16 DIAGNOSIS — E1151 Type 2 diabetes mellitus with diabetic peripheral angiopathy without gangrene: Secondary | ICD-10-CM | POA: Diagnosis not present

## 2020-03-20 DIAGNOSIS — I5032 Chronic diastolic (congestive) heart failure: Secondary | ICD-10-CM | POA: Diagnosis not present

## 2020-03-20 DIAGNOSIS — S80821D Blister (nonthermal), right lower leg, subsequent encounter: Secondary | ICD-10-CM | POA: Diagnosis not present

## 2020-03-20 DIAGNOSIS — I480 Paroxysmal atrial fibrillation: Secondary | ICD-10-CM | POA: Diagnosis not present

## 2020-03-20 DIAGNOSIS — I110 Hypertensive heart disease with heart failure: Secondary | ICD-10-CM | POA: Diagnosis not present

## 2020-03-20 DIAGNOSIS — Z4800 Encounter for change or removal of nonsurgical wound dressing: Secondary | ICD-10-CM | POA: Diagnosis not present

## 2020-03-20 DIAGNOSIS — E1151 Type 2 diabetes mellitus with diabetic peripheral angiopathy without gangrene: Secondary | ICD-10-CM | POA: Diagnosis not present

## 2020-03-21 NOTE — Progress Notes (Signed)
Cardiology Office Note    Date:  03/27/2020   ID:  Warren Mcdonald, DOB [birth year], MRN [id number]  PCP:  Anabel Halon, MD  Cardiologist: Dina Rich, MD EPS: None  Chief Complaint  Patient presents with  . Hospitalization Follow-up    History of Present Illness:  Warren Mcdonald is a 86 y.o. 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  Patient last saw Dr. Wyline Mood 01/17/2020 and started him on losartan 12.5 mg daily.  Patient discharged from the hospital 02/22/2020 after being treated for acute on chronic systolic CHF. Sent home on Lasix 40 mg daily. Losartan, Metformin, and potassium were stopped, metoprolol decreased from 200 mg once a day to 25 mg twice daily because of soft BP. Was not seen by cardiology. K was high 5.6. Eliquis reduced to 2.5 mg BID because of renal function.  Patient comes in for f/u accompanied by his wife. After he eats he gets gas and that causes breathing problems.  Also complains of sinus problems.  Initially he denies cardiac complaints but after I listened to him and his heart rate was elevated he says when he moves around a lot his heart will race.  Wife says they ran out of Eliquis on Friday.  Past Medical History:  Diagnosis Date  . Abdominal distension (gaseous)   . Anemia, unspecified   . Chronic diastolic (congestive) heart failure (HCC)   . Essential (primary) hypertension   . Hyperlipidemia   . PAF (paroxysmal atrial fibrillation) (HCC)   . Peripheral vascular disease (HCC) 2011   Left above-knee popliteal to posterior tibial artery bypass   . PUD (peptic ulcer disease)   . Type 2 diabetes mellitus with diabetic peripheral angiopathy without gangrene [facility])     Past Surgical History:   Procedure Laterality Date  . ABDOMINAL AORTOGRAM N/A 05/24/2019   Procedure: ABDOMINAL AORTOGRAM;  Surgeon: Maeola Harman, MD;  Location: [facility] INVASIVE CV LAB;  Service: Cardiovascular;  Laterality: N/A;  . AMPUTATION Left 06/16/2019   Procedure: AMPUTATION DIGIT FOURTH TOE LEFT FOOT;  Surgeon: Nada Libman, MD;  Location: [facility] OR;  Service: Vascular;  Laterality: Left;  . AMPUTATION Left 06/26/2019   Procedure: AMPUTATION two DIGITs, left toe;  Surgeon: Maeola Harman, MD;  Location: [facility] OR;  Service: Vascular;  Laterality: Left;  . AMPUTATION Left 07/02/2019   Procedure: left AMPUTATION ABOVE KNEE;  Surgeon: Larina Earthly, MD;  Location: [facility] OR;  Service: Vascular;  Laterality: Left;  . APPENDECTOMY    . CATARACT EXTRACTION W/ INTRAOCULAR LENS  IMPLANT, BILATERAL    . CHOLECYSTECTOMY N/A 09/08/2014   Procedure: CHOLECYSTECTOMY;  Surgeon: Franky Macho Md, MD;  Location: [facility] ORS;  Service: General;  Laterality: N/A;  . EYE SURGERY    . I & D EXTREMITY Left 06/30/2019   Procedure: sharp incisonal DEBRIDEMENT of left foot including bone, tendon, muscle, and skin;  Surgeon: Cephus Shelling, MD;  Location: [facility] OR;  Service: Vascular;  Laterality: Left;  . LOWER EXTREMITY ANGIOGRAPHY Left 05/24/2019   Procedure: Lower Extremity Angiography;  Surgeon: Maeola Harman, MD;  Location: [facility] INVASIVE CV LAB;  Service: Cardiovascular;  Laterality: Left;  . PERIPHERAL VASCULAR BALLOON ANGIOPLASTY Left 05/24/2019   Procedure: PERIPHERAL VASCULAR BALLOON  ANGIOPLASTY;  Surgeon: Maeola Harman, MD;  Location: [facility] INVASIVE CV LAB;  Service: Cardiovascular;  Laterality: Left;  tibial bypass  . PR VEIN BYPASS GRAFT,AORTO-FEM-POP     Left above knee popliteal to posterior tibial artery bypass graft    Current Medications: Current Meds  Medication Sig  . apixaban (ELIQUIS) 2.5 MG TABS tablet Take 1 tablet (2.5 mg total) by mouth 2 (two) times daily.  Marland Kitchen atorvastatin  (LIPITOR) 20 MG tablet Take 1 tablet (20 mg total) by mouth daily at 6 PM.  . furosemide (LASIX) 40 MG tablet Take 1 tablet (40 mg total) by mouth daily.  Marland Kitchen lubiprostone (AMITIZA) 24 MCG capsule Take 1 capsule (24 mcg total) by mouth 2 (two) times daily with a meal.  . metFORMIN (GLUCOPHAGE) 500 MG tablet Take 1 tablet (500 mg total) by mouth 2 (two) times daily with a meal.  . potassium chloride 20 MEQ TBCR Take 20 mEq by mouth daily.  Marland Kitchen senna (SENOKOT) 8.6 MG TABS tablet Take 1 tablet (8.6 mg total) by mouth daily as needed for mild constipation.  . silver sulfADIAZINE (SILVADENE) 1 % cream Apply topically daily.  . [DISCONTINUED] apixaban (ELIQUIS) 2.5 MG TABS tablet Take 1 tablet (2.5 mg total) by mouth 2 (two) times daily.  . [DISCONTINUED] metoprolol tartrate (LOPRESSOR) 25 MG tablet Take 1 tablet (25 mg total) by mouth 2 (two) times daily.     Allergies:   Patient has no known allergies.   Social History   Socioeconomic History  . Marital status: Married    Spouse name: Not on file  . Number of children: Not on file  . Years of education: Not on file  . Highest education level: Not on file  Occupational History  . Not on file  Tobacco Use  . Smoking status: Former Smoker    Types: Cigars    Quit date: 05/27/1988    Years since quitting: 31.8  . Smokeless tobacco: Former Neurosurgeon    Types: Chew    Quit date: 05/27/1988  Vaping Use  . Vaping Use: Never used  Substance and Sexual Activity  . Alcohol use: No    Alcohol/week: 0.0 standard drinks  . Drug use: No  . Sexual activity: Not on file  Other Topics Concern  . Not on file  Social History Narrative  . Not on file   Social Determinants of Health   Financial Resource Strain:   . Difficulty of Paying Living Expenses: Not on file  Food Insecurity: No Food Insecurity  . Worried About Programme researcher, broadcasting/film/video in the Last Year: Never true  . Ran Out of Food in the Last Year: Never true  Transportation Needs: No Transportation  Needs  . Lack of Transportation (Medical): No  . Lack of Transportation (Non-Medical): No  Physical Activity:   . Days of Exercise per Week: Not on file  . Minutes of Exercise per Session: Not on file  Stress:   . Feeling of Stress : Not on file  Social Connections:   . Frequency of Communication with Friends and Family: Not on file  . Frequency of Social Gatherings with Friends and Family: Not on file  . Attends Religious Services: Not on file  . Active Member of Clubs or Organizations: Not on file  . Attends Banker Meetings: Not on file  . Marital Status: Not on file     Family History:  The patient's family history includes Cancer in his brother, daughter, and sister;  Deep vein thrombosis in his sister; Diabetes in his daughter, sister, and another family member; Heart attack in his father and mother; Heart disease in his father and mother; Hypertension in his mother; Prostate cancer in an other family member.   ROS:   Please see the history of present illness.    ROS All other systems reviewed and are negative.   PHYSICAL EXAM:   VS:  BP 122/68   Pulse (!) 116   Ht 6\' 1"  (1.854 m)   SpO2 100%   BMI 26.65 kg/m   Physical Exam  GEN: Well nourished, well developed, in no acute distress  Neck: no JVD, carotid bruits, or masses Cardiac: Irregular irregular at 116 beats per Respiratory:  clear to auscultation bilaterally, normal work of breathing GI: soft, nontender, nondistended, + BS Ext: Right AKA, left no edema wrapped Neuro:  Alert and Oriented x 3 Psych: euthymic mood, full affect  Wt Readings from Last 3 Encounters:  03/07/20 202 lb (91.6 kg)  02/21/20 202 lb 2.6 oz (91.7 kg)  01/17/20 201 lb (91.2 kg)      Studies/Labs Reviewed:   EKG:  EKG is not ordered today.   Recent Labs: 07/05/2019: TSH 2.795 02/20/2020: B Natriuretic Peptide 1,811.0 03/08/2020: ALT 12; BUN 22; Creatinine, Ser 1.64; Hemoglobin 13.5; Magnesium 1.9; Platelets 252; Potassium  5.1; Sodium 137   Lipid Panel    Component Value Date/Time   CHOL 142 07/05/2019 1047   TRIG 100 07/05/2019 1047   HDL 30 (L) 07/05/2019 1047   CHOLHDL 4.7 07/05/2019 1047   VLDL 20 07/05/2019 1047   LDLCALC 92 07/05/2019 1047    Additional studies/ records that were reviewed today include:  09/2019 echo IMPRESSIONS     1. Left ventricular ejection fraction, by estimation, is 40 to 45%. The  left ventricle has mildly decreased function. LVEF evaluation may be  affected by afib with elevated rates during study   2. Limited echo to evaluate LV function.   FINDINGS   Left Ventricle: Left ventricular ejection fraction, by estimation, is 40  to 45%. The left ventricle has mildly decreased function.          ASSESSMENT:    1. Chronic systolic CHF (congestive heart failure) (HCC)   2. AKI (acute kidney injury) (HCC)   3. Hyperkalemia   4. Paroxysmal atrial fibrillation (HCC)   5. Essential hypertension   6. Hyperlipidemia, unspecified hyperlipidemia type   7. Type 2 diabetes mellitus without complication, without long-term current use of insulin (HCC)      PLAN:  In order of problems listed above:  Chronic CHF LVEF 40 to 45% on echo 09/2019 with recent hospitalization requiring IV Lasix.  Currently compensated without evidence of fluid overload.  AKI on CKD stage III setting of acute heart failure creatinine trended up to 2 losartan and Metformin stopped, Eliquis dose reduced.  Most recent creatinine 03/08/2020 creatinine 1.6.  Hyperkalemia potassium was 5.6 losartan stopped recheck was 5.1 on 03/08/2020  PAF blood pressures were soft in the hospital and metoprolol was reduced from 200 mg daily to 25 mg twice daily.  Now having rapid heart rates.  Will increase metoprolol to 50 mg twice daily.  His wife has a blood pressure machine at home and will keep track of his blood pressure and pulse.  Return next week for nurse visit to check his heart rate and further adjust his  metoprolol.  Eliquis dose was reduced to 2.5 mg twice daily because of renal insufficiency.  He ran out on Friday.  We will give him paperwork for patient assistance and make sure pharmacy has the corrected dose of Eliquis.  2 boxes of samples given.  Essential hypertension blood pressure now stable to increase metoprolol  HLD on Lipitor  DM type II on Tradjenta managed by PCP        Medication Adjustments/Labs and Tests Ordered: Current medicines are reviewed at length with the patient today.  Concerns regarding medicines are outlined above.  Medication changes, Labs and Tests ordered today are listed in the Patient Instructions below. Patient Instructions  Medication Instructions:  INCREASE Lopressor to 50 mg twice a day   *If you need a refill on your cardiac medications before your next appointment, please call your pharmacy*   Lab Work: None today  If you have labs (blood work) drawn today and your tests are completely normal, you will receive your results only by: Marland Kitchen MyChart Message (if you have MyChart) OR . A paper copy in the mail If you have any lab test that is abnormal or we need to change your treatment, we will call you to review the results.   Testing/Procedures: None today   Follow-Up: At [facility], you and your health needs are our priority.  As part of our continuing mission to provide you with exceptional heart care, we have created designated Provider Care Teams.  These Care Teams include your primary Cardiologist (physician) and Advanced Practice Providers (APPs -  Physician Assistants and Nurse Practitioners) who all work together to provide you with the care you need, when you need it.  We recommend signing up for the patient portal called "MyChart".  Sign up information is provided on this After Visit Summary.  MyChart is used to connect with patients for Virtual Visits (Telemedicine).  Patients are able to view lab/test results, encounter notes,  upcoming appointments, etc.  Non-urgent messages can be sent to your provider as well.   To learn more about what you can do with MyChart, go to [email or web address].    Your next appointment:  Nurse check next week when Jacolyn Reedy is here    Next available apt with Dr.Branch     Eliquis 2.5 mg samples, # 28 lot [id number]. Exp 10/22    SignedJacolyn Reedy, PA-C  03/27/2020 12:49 PM    [facility] [address] [place], [place], [place]  [address] Phone: [phone]; Fax: [phone]

## 2020-03-22 DIAGNOSIS — M79674 Pain in right toe(s): Secondary | ICD-10-CM | POA: Diagnosis not present

## 2020-03-22 DIAGNOSIS — M79671 Pain in right foot: Secondary | ICD-10-CM | POA: Diagnosis not present

## 2020-03-22 DIAGNOSIS — L03031 Cellulitis of right toe: Secondary | ICD-10-CM | POA: Diagnosis not present

## 2020-03-22 DIAGNOSIS — L600 Ingrowing nail: Secondary | ICD-10-CM | POA: Diagnosis not present

## 2020-03-23 DIAGNOSIS — Z4800 Encounter for change or removal of nonsurgical wound dressing: Secondary | ICD-10-CM | POA: Diagnosis not present

## 2020-03-23 DIAGNOSIS — I480 Paroxysmal atrial fibrillation: Secondary | ICD-10-CM | POA: Diagnosis not present

## 2020-03-23 DIAGNOSIS — I5032 Chronic diastolic (congestive) heart failure: Secondary | ICD-10-CM | POA: Diagnosis not present

## 2020-03-23 DIAGNOSIS — E1151 Type 2 diabetes mellitus with diabetic peripheral angiopathy without gangrene: Secondary | ICD-10-CM | POA: Diagnosis not present

## 2020-03-23 DIAGNOSIS — S80821D Blister (nonthermal), right lower leg, subsequent encounter: Secondary | ICD-10-CM | POA: Diagnosis not present

## 2020-03-23 DIAGNOSIS — I110 Hypertensive heart disease with heart failure: Secondary | ICD-10-CM | POA: Diagnosis not present

## 2020-03-24 ENCOUNTER — Other Ambulatory Visit: Payer: Self-pay | Admitting: *Deleted

## 2020-03-24 NOTE — Patient Outreach (Signed)
[facility]) Care Management  03/24/2020  Warren Mcdonald 1930-01-24 [id number]   Call placed to member/wife to follow up on management of heart failure, no answer.  HIPAA compliant voice message left. Will follow up within the next 3-4 business days.  Kemper Durie, [place], MSN [facility] Care Management  [facility] Manager [phone]

## 2020-03-27 ENCOUNTER — Ambulatory Visit (INDEPENDENT_AMBULATORY_CARE_PROVIDER_SITE_OTHER): Payer: Medicare Other | Admitting: Physician Assistant

## 2020-03-27 ENCOUNTER — Encounter: Payer: Self-pay | Admitting: Physician Assistant

## 2020-03-27 ENCOUNTER — Other Ambulatory Visit: Payer: Self-pay

## 2020-03-27 VITALS — BP 122/68 | HR 116 | Ht 73.0 in

## 2020-03-27 DIAGNOSIS — N179 Acute kidney failure, unspecified: Secondary | ICD-10-CM | POA: Diagnosis not present

## 2020-03-27 DIAGNOSIS — E875 Hyperkalemia: Secondary | ICD-10-CM

## 2020-03-27 DIAGNOSIS — E785 Hyperlipidemia, unspecified: Secondary | ICD-10-CM

## 2020-03-27 DIAGNOSIS — E119 Type 2 diabetes mellitus without complications: Secondary | ICD-10-CM

## 2020-03-27 DIAGNOSIS — S80821D Blister (nonthermal), right lower leg, subsequent encounter: Secondary | ICD-10-CM | POA: Diagnosis not present

## 2020-03-27 DIAGNOSIS — I5022 Chronic systolic (congestive) heart failure: Secondary | ICD-10-CM

## 2020-03-27 DIAGNOSIS — I779 Disorder of arteries and arterioles, unspecified: Secondary | ICD-10-CM | POA: Diagnosis not present

## 2020-03-27 DIAGNOSIS — I480 Paroxysmal atrial fibrillation: Secondary | ICD-10-CM | POA: Diagnosis not present

## 2020-03-27 DIAGNOSIS — I10 Essential (primary) hypertension: Secondary | ICD-10-CM | POA: Diagnosis not present

## 2020-03-27 DIAGNOSIS — I5032 Chronic diastolic (congestive) heart failure: Secondary | ICD-10-CM | POA: Diagnosis not present

## 2020-03-27 DIAGNOSIS — Z4800 Encounter for change or removal of nonsurgical wound dressing: Secondary | ICD-10-CM | POA: Diagnosis not present

## 2020-03-27 DIAGNOSIS — E1151 Type 2 diabetes mellitus with diabetic peripheral angiopathy without gangrene: Secondary | ICD-10-CM | POA: Diagnosis not present

## 2020-03-27 DIAGNOSIS — I110 Hypertensive heart disease with heart failure: Secondary | ICD-10-CM | POA: Diagnosis not present

## 2020-03-27 MED ORDER — METOPROLOL TARTRATE 50 MG PO TABS: 50.0000 mg | ORAL_TABLET | Freq: Two times a day (BID) | ORAL | 3 refills | Status: DC

## 2020-03-27 MED ORDER — APIXABAN 2.5 MG PO TABS
2.5000 mg | ORAL_TABLET | Freq: Two times a day (BID) | ORAL | 6 refills | Status: DC
Start: 2020-03-27 — End: 2020-05-04

## 2020-03-27 NOTE — Patient Instructions (Addendum)
Medication Instructions:  INCREASE Lopressor to 50 mg twice a day   *If you need a refill on your cardiac medications before your next appointment, please call your pharmacy*   Lab Work: None today  If you have labs (blood work) drawn today and your tests are completely normal, you will receive your results only by: Marland Kitchen MyChart Message (if you have MyChart) OR . A paper copy in the mail If you have any lab test that is abnormal or we need to change your treatment, we will call you to review the results.   Testing/Procedures: None today   Follow-Up: At [facility], you and your health needs are our priority.  As part of our continuing mission to provide you with exceptional heart care, we have created designated Provider Care Teams.  These Care Teams include your primary Cardiologist (physician) and Advanced Practice Providers (APPs -  Physician Assistants and Nurse Practitioners) who all work together to provide you with the care you need, when you need it.  We recommend signing up for the patient portal called "MyChart".  Sign up information is provided on this After Visit Summary.  MyChart is used to connect with patients for Virtual Visits (Telemedicine).  Patients are able to view lab/test results, encounter notes, upcoming appointments, etc.  Non-urgent messages can be sent to your provider as well.   To learn more about what you can do with MyChart, go to [email or web address].    Your next appointment:  Nurse check next week when Jacolyn Reedy is here    Next available apt with Dr.Branch     Eliquis 2.5 mg samples, # 28 lot [id number]. Exp 10/22

## 2020-03-30 ENCOUNTER — Other Ambulatory Visit: Payer: Self-pay | Admitting: *Deleted

## 2020-03-30 DIAGNOSIS — I480 Paroxysmal atrial fibrillation: Secondary | ICD-10-CM | POA: Diagnosis not present

## 2020-03-30 DIAGNOSIS — Z4800 Encounter for change or removal of nonsurgical wound dressing: Secondary | ICD-10-CM | POA: Diagnosis not present

## 2020-03-30 DIAGNOSIS — E1151 Type 2 diabetes mellitus with diabetic peripheral angiopathy without gangrene: Secondary | ICD-10-CM | POA: Diagnosis not present

## 2020-03-30 DIAGNOSIS — S80821D Blister (nonthermal), right lower leg, subsequent encounter: Secondary | ICD-10-CM | POA: Diagnosis not present

## 2020-03-30 DIAGNOSIS — I110 Hypertensive heart disease with heart failure: Secondary | ICD-10-CM | POA: Diagnosis not present

## 2020-03-30 DIAGNOSIS — I5032 Chronic diastolic (congestive) heart failure: Secondary | ICD-10-CM | POA: Diagnosis not present

## 2020-03-30 NOTE — Patient Outreach (Signed)
[facility]) Care Management  03/30/2020  CHIJIOKE LASSER [birth year] [id number]   Outreach attempt #2, unsuccessful.  Unable to leave message, mailbox full.  Will send unsuccessful outreach letter and follow up within the next 3-4 business days.  Kemper Durie, [place], MSN [facility] Care Management  [facility] Manager [phone]

## 2020-04-03 ENCOUNTER — Other Ambulatory Visit (INDEPENDENT_AMBULATORY_CARE_PROVIDER_SITE_OTHER): Payer: Medicare Other

## 2020-04-03 ENCOUNTER — Other Ambulatory Visit: Payer: Self-pay

## 2020-04-03 VITALS — BP 124/70 | HR 125 | Ht 73.0 in

## 2020-04-03 DIAGNOSIS — I4891 Unspecified atrial fibrillation: Secondary | ICD-10-CM

## 2020-04-03 MED ORDER — METOPROLOL TARTRATE 100 MG PO TABS: 100.0000 mg | ORAL_TABLET | Freq: Two times a day (BID) | ORAL | 3 refills | Status: DC

## 2020-04-03 NOTE — Progress Notes (Unsigned)
Pt in office for blood pressure and HR check. Verbal order from Jacolyn Reedy, PA-C given to increase Lopressor from 50 mg BID to 100 mg BID. Pt and wife informed and voiced understanding.

## 2020-04-03 NOTE — Patient Instructions (Signed)
Medication Instructions:  Increase Lopressor to 100 mg Two Times Daily ( May take 2 of the 50 mg Tablets Two Times Daily until you pick up new prescription Then take 1 tablet two Times Daily).   Labwork: NONE   Testing/Procedures: NONE   Follow-Up: Your physician recommends that you schedule a follow-up appointment in:  One week with nurse visit.    Any Other Special Instructions Will Be Listed Below (If Applicable).     If you need a refill on your cardiac medications before your next appointment, please call your pharmacy.  Thank you for choosing [facility]!

## 2020-04-04 DIAGNOSIS — Z4800 Encounter for change or removal of nonsurgical wound dressing: Secondary | ICD-10-CM | POA: Diagnosis not present

## 2020-04-05 ENCOUNTER — Other Ambulatory Visit: Payer: Self-pay | Admitting: *Deleted

## 2020-04-05 NOTE — Patient Outreach (Signed)
[facility]) Care Management  04/05/2020  HYUN MARSALIS 09-08-1929 [id number]   Outreach attempt #3, unsuccessful, HIPAA compliant voice message left. Will make 4th and final attempt within the next 4 weeks.  If remain unsuccessful, will close case due to inability to maintain contact.  Kemper Durie, [place], MSN [facility] Care Management  [facility] Manager [phone]

## 2020-04-11 ENCOUNTER — Encounter: Payer: Self-pay | Admitting: Cardiology

## 2020-04-11 ENCOUNTER — Ambulatory Visit (INDEPENDENT_AMBULATORY_CARE_PROVIDER_SITE_OTHER): Payer: Medicare Other | Admitting: Cardiology

## 2020-04-11 ENCOUNTER — Other Ambulatory Visit: Payer: Self-pay

## 2020-04-11 VITALS — BP 124/80 | HR 97 | Ht 73.0 in | Wt 215.0 lb

## 2020-04-11 DIAGNOSIS — N1832 Chronic kidney disease, stage 3b: Secondary | ICD-10-CM | POA: Diagnosis not present

## 2020-04-11 DIAGNOSIS — Z89612 Acquired absence of left leg above knee: Secondary | ICD-10-CM

## 2020-04-11 DIAGNOSIS — I779 Disorder of arteries and arterioles, unspecified: Secondary | ICD-10-CM

## 2020-04-11 DIAGNOSIS — I5042 Chronic combined systolic (congestive) and diastolic (congestive) heart failure: Secondary | ICD-10-CM

## 2020-04-11 DIAGNOSIS — I739 Peripheral vascular disease, unspecified: Secondary | ICD-10-CM

## 2020-04-11 DIAGNOSIS — I480 Paroxysmal atrial fibrillation: Secondary | ICD-10-CM | POA: Diagnosis not present

## 2020-04-11 DIAGNOSIS — E1151 Type 2 diabetes mellitus with diabetic peripheral angiopathy without gangrene: Secondary | ICD-10-CM

## 2020-04-11 NOTE — Assessment & Plan Note (Signed)
Currently stable by history and exam.  We'll have them return with his medications so we can get that sorted out.  No change in Rx.

## 2020-04-11 NOTE — Assessment & Plan Note (Signed)
Check BMP when he returns later this week

## 2020-04-11 NOTE — Assessment & Plan Note (Signed)
Appears to be persistent atrial fibrillation - rate controlled on exam.

## 2020-04-11 NOTE — Progress Notes (Signed)
Cardiology Office Note:    Date:  04/11/2020   ID:  Warren Mcdonald, DOB 1930/04/29, MRN [id number]  PCP:  Anabel Halon, MD  Cardiologist:  Dina Rich, MD  Electrophysiologist:  None   Referring MD: Anabel Halon, MD   No chief complaint on file.   History of Present Illness:    Warren Mcdonald is a 86 y.o. male with a hx of PAF on Eliquis, chronic systolic CHF with an LVEF of 40 to 45% by echo 09/2019, (given advanced age ischemic testing has not been pursued), HTN, PAD status post left AKA 06/2019 followed by vascular, HLD, CRI-3b, and DM type II. He was last admitted 02/20/2020 and discharged from the hospital 02/22/2020 after being treated for acute on chronic systolic CHF. Sent home on Lasix 40 mg daily. Losartan, Metformin, and potassium were stopped, metoprolol was decreased from 200 mg once a day to 25 mg twice daily because of soft BP. The patient was not seen by cardiology during that admission. Eliquis was reduced to 2.5 mg BID because of renal function.  He was just seen in the office 03/27/2020.  He had apparently run out of his Eliquis and assistance paperwork was provided.   He returns today for follow up.  He is unsure of his medications. The list they have does not match the list he had at his [facility]OV. He has managed to get back on Eliquis.  Fortunately he feels well, no unusual dyspnea.   Past Medical History:  Diagnosis Date  . Abdominal distension (gaseous)   . Anemia, unspecified   . Chronic diastolic (congestive) heart failure (HCC)   . Essential (primary) hypertension   . Hyperlipidemia   . PAF (paroxysmal atrial fibrillation) (HCC)   . Peripheral vascular disease (HCC) 2011   Left above-knee popliteal to posterior tibial artery bypass   . PUD (peptic ulcer disease)   . Type 2 diabetes mellitus with diabetic peripheral angiopathy without gangrene [facility])     Past Surgical History:  Procedure Laterality Date  . ABDOMINAL AORTOGRAM N/A 05/24/2019    Procedure: ABDOMINAL AORTOGRAM;  Surgeon: Maeola Harman, MD;  Location: [facility] INVASIVE CV LAB;  Service: Cardiovascular;  Laterality: N/A;  . AMPUTATION Left 06/16/2019   Procedure: AMPUTATION DIGIT FOURTH TOE LEFT FOOT;  Surgeon: Nada Libman, MD;  Location: [facility] OR;  Service: Vascular;  Laterality: Left;  . AMPUTATION Left 06/26/2019   Procedure: AMPUTATION two DIGITs, left toe;  Surgeon: Maeola Harman, MD;  Location: [facility] OR;  Service: Vascular;  Laterality: Left;  . AMPUTATION Left 07/02/2019   Procedure: left AMPUTATION ABOVE KNEE;  Surgeon: Larina Earthly, MD;  Location: [facility] OR;  Service: Vascular;  Laterality: Left;  . APPENDECTOMY    . CATARACT EXTRACTION W/ INTRAOCULAR LENS  IMPLANT, BILATERAL    . CHOLECYSTECTOMY N/A 09/08/2014   Procedure: CHOLECYSTECTOMY;  Surgeon: Franky Macho Md, MD;  Location: [facility] ORS;  Service: General;  Laterality: N/A;  . EYE SURGERY    . I & D EXTREMITY Left 06/30/2019   Procedure: sharp incisonal DEBRIDEMENT of left foot including bone, tendon, muscle, and skin;  Surgeon: Cephus Shelling, MD;  Location: [facility] OR;  Service: Vascular;  Laterality: Left;  . LOWER EXTREMITY ANGIOGRAPHY Left 05/24/2019   Procedure: Lower Extremity Angiography;  Surgeon: Maeola Harman, MD;  Location: [facility] INVASIVE CV LAB;  Service: Cardiovascular;  Laterality: Left;  . PERIPHERAL VASCULAR BALLOON ANGIOPLASTY Left 05/24/2019   Procedure: PERIPHERAL VASCULAR BALLOON ANGIOPLASTY;  Surgeon: Maeola Harman, MD;  Location: [facility] INVASIVE CV LAB;  Service: Cardiovascular;  Laterality: Left;  tibial bypass  . PR VEIN BYPASS GRAFT,AORTO-FEM-POP     Left above knee popliteal to posterior tibial artery bypass graft    Current Medications: Current Meds  Medication Sig  . apixaban (ELIQUIS) 2.5 MG TABS tablet Take 1 tablet (2.5 mg total) by mouth 2 (two) times daily.  Marland Kitchen aspirin EC 81 MG tablet Take 81 mg by mouth daily. Swallow whole.  Marland Kitchen atorvastatin  (LIPITOR) 20 MG tablet Take 1 tablet (20 mg total) by mouth daily at 6 PM.  . furosemide (LASIX) 40 MG tablet Take 1 tablet (40 mg total) by mouth daily.  Marland Kitchen lubiprostone (AMITIZA) 24 MCG capsule Take 1 capsule (24 mcg total) by mouth 2 (two) times daily with a meal.  . metFORMIN (GLUCOPHAGE) 500 MG tablet Take 1 tablet (500 mg total) by mouth 2 (two) times daily with a meal.  . metoprolol tartrate (LOPRESSOR) 100 MG tablet Take 1 tablet (100 mg total) by mouth 2 (two) times daily.  . potassium chloride 20 MEQ TBCR Take 20 mEq by mouth daily.  Marland Kitchen senna (SENOKOT) 8.6 MG TABS tablet Take 1 tablet (8.6 mg total) by mouth daily as needed for mild constipation.  . silver sulfADIAZINE (SILVADENE) 1 % cream Apply topically daily.     Allergies:   Patient has no known allergies.   Social History   Socioeconomic History  . Marital status: Married    Spouse name: Not on file  . Number of children: Not on file  . Years of education: Not on file  . Highest education level: Not on file  Occupational History  . Not on file  Tobacco Use  . Smoking status: Former Smoker    Types: Cigars    Quit date: 05/27/1988    Years since quitting: 31.8  . Smokeless tobacco: Former Neurosurgeon    Types: Chew    Quit date: 05/27/1988  Vaping Use  . Vaping Use: Never used  Substance and Sexual Activity  . Alcohol use: No    Alcohol/week: 0.0 standard drinks  . Drug use: No  . Sexual activity: Not on file  Other Topics Concern  . Not on file  Social History Narrative  . Not on file   Social Determinants of Health   Financial Resource Strain:   . Difficulty of Paying Living Expenses: Not on file  Food Insecurity: No Food Insecurity  . Worried About Programme researcher, broadcasting/film/video in the Last Year: Never true  . Ran Out of Food in the Last Year: Never true  Transportation Needs: No Transportation Needs  . Lack of Transportation (Medical): No  . Lack of Transportation (Non-Medical): No  Physical Activity:   . Days of  Exercise per Week: Not on file  . Minutes of Exercise per Session: Not on file  Stress:   . Feeling of Stress : Not on file  Social Connections:   . Frequency of Communication with Friends and Family: Not on file  . Frequency of Social Gatherings with Friends and Family: Not on file  . Attends Religious Services: Not on file  . Active Member of Clubs or Organizations: Not on file  . Attends Banker Meetings: Not on file  . Marital Status: Not on file     Family History: The patient's family history includes Cancer in his brother, daughter, and sister; Deep vein thrombosis in his sister; Diabetes in his daughter,  sister, and another family member; Heart attack in his father and mother; Heart disease in his father and mother; Hypertension in his mother; Prostate cancer in an other family member.  ROS:   Please see the history of present illness.     All other systems reviewed and are negative.  EKGs/Labs/Other Studies Reviewed:    The following studies were reviewed today: Echo 10/11/2019- IMPRESSIONS    1. Left ventricular ejection fraction, by estimation, is 40 to 45%. The  left ventricle has mildly decreased function. LVEF evaluation may be  affected by afib with elevated rates during study  2. Limited echo to evaluate LV function.   FINDINGS  Left Ventricle: Left ventricular ejection fraction, by estimation, is 40  to 45%. The left ventricle has mildly decreased function.   EKG:  EKG is not ordered today.  The ekg ordered 04/05/2020 demonstrates AF with VR 125.  Recent Labs: 07/05/2019: TSH 2.795 02/20/2020: B Natriuretic Peptide 1,811.0 03/08/2020: ALT 12; BUN 22; Creatinine, Ser 1.64; Hemoglobin 13.5; Magnesium 1.9; Platelets 252; Potassium 5.1; Sodium 137  Recent Lipid Panel    Component Value Date/Time   CHOL 142 07/05/2019 1047   TRIG 100 07/05/2019 1047   HDL 30 (L) 07/05/2019 1047   CHOLHDL 4.7 07/05/2019 1047   VLDL 20 07/05/2019 1047   LDLCALC  92 07/05/2019 1047    Physical Exam:    VS:  BP 124/80   Pulse 97   Ht 6\' 1"  (1.854 m)   Wt 215 lb (97.5 kg) Comment: per pt report as unable to stand  SpO2 98%   BMI 28.37 kg/m     Wt Readings from Last 3 Encounters:  04/11/20 215 lb (97.5 kg)  03/07/20 202 lb (91.6 kg)  02/21/20 202 lb 2.6 oz (91.7 kg)     GEN:  Well nourished, AA male, presents to the office in a wheelchair,well developed in no acute distress HEENT: Normal NECK: No JVD; No carotid bruits CARDIAC: irregularly irregular, no murmurs, rubs, gallops RESPIRATORY:  Clear to auscultation without rales, wheezing or rhonchi  ABDOMEN: Soft, non-tender, non-distended MUSCULOSKELETAL:  No edema; Lt AKA SKIN: Warm and dry NEUROLOGIC:  Alert and oriented x 3 PSYCHIATRIC:  Normal affect   ASSESSMENT:    Chronic combined systolic and diastolic congestive heart failure (HCC) Currently stable by history and exam.  We'll have them return with his medications so we can get that sorted out.  No change in Rx.   CKD (chronic kidney disease) stage 3, GFR 30-59 ml/min (HCC) Check BMP when he returns later this week   PAF (paroxysmal atrial fibrillation) (HCC) Appears to be persistent atrial fibrillation - rate controlled on exam.  S/P AKA (above knee amputation) (HCC) Wheelchair bound  PLAN:    OV with a nurse to review medications.  Check BMP. F/u in 6-8 weeks.  Medication Adjustments/Labs and Tests Ordered: Current medicines are reviewed at length with the patient today.  Concerns regarding medicines are outlined above.  Orders Placed This Encounter  Procedures  . Basic Metabolic Panel (BMET)   No orders of the defined types were placed in this encounter.   Patient Instructions  Medication Instructions:  Your physician recommends that you continue on your current medications as directed. Please refer to the Current Medication list given to you today.  *If you need a refill on your cardiac medications before  your next appointment, please call your pharmacy*   Lab Work: BMP, get done tomorrow    If you have  labs (blood work) drawn today and your tests are completely normal, you will receive your results only by: Marland Kitchen MyChart Message (if you have MyChart) OR . A paper copy in the mail If you have any lab test that is abnormal or we need to change your treatment, we will call you to review the results.   Testing/Procedures: none   Follow-Up: At [facility], you and your health needs are our priority.  As part of our continuing mission to provide you with exceptional heart care, we have created designated Provider Care Teams.  These Care Teams include your primary Cardiologist (physician) and Advanced Practice Providers (APPs -  Physician Assistants and Nurse Practitioners) who all work together to provide you with the care you need, when you need it.  We recommend signing up for the patient portal called "MyChart".  Sign up information is provided on this After Visit Summary.  MyChart is used to connect with patients for Virtual Visits (Telemedicine).  Patients are able to view lab/test results, encounter notes, upcoming appointments, etc.  Non-urgent messages can be sent to your provider as well.   To learn more about what you can do with MyChart, go to [email or web address].    Your next appointment:   2 month(s)  The format for your next appointment:   In Person  Provider:   You will see one of the following Advanced Practice Providers on your designated Care Team:    Randall An, PA-C   Jacolyn Reedy, [place]       Other Instructions [facility] MEDICINES TO NURSE VISIT ON Thursday MORNING     Signed, Antavious Spanos, [place]  04/11/2020 4:17 PM    [facility]

## 2020-04-11 NOTE — Assessment & Plan Note (Signed)
Wheelchair bound 

## 2020-04-11 NOTE — Patient Instructions (Addendum)
Medication Instructions:  Your physician recommends that you continue on your current medications as directed. Please refer to the Current Medication list given to you today.  *If you need a refill on your cardiac medications before your next appointment, please call your pharmacy*   Lab Work: BMP, get done tomorrow    If you have labs (blood work) drawn today and your tests are completely normal, you will receive your results only by: Marland Kitchen MyChart Message (if you have MyChart) OR . A paper copy in the mail If you have any lab test that is abnormal or we need to change your treatment, we will call you to review the results.   Testing/Procedures: none   Follow-Up: At [facility], you and your health needs are our priority.  As part of our continuing mission to provide you with exceptional heart care, we have created designated Provider Care Teams.  These Care Teams include your primary Cardiologist (physician) and Advanced Practice Providers (APPs -  Physician Assistants and Nurse Practitioners) who all work together to provide you with the care you need, when you need it.  We recommend signing up for the patient portal called "MyChart".  Sign up information is provided on this After Visit Summary.  MyChart is used to connect with patients for Virtual Visits (Telemedicine).  Patients are able to view lab/test results, encounter notes, upcoming appointments, etc.  Non-urgent messages can be sent to your provider as well.   To learn more about what you can do with MyChart, go to [email or web address].    Your next appointment:   2 month(s)  The format for your next appointment:   In Person  Provider:   You will see one of the following Advanced Practice Providers on your designated Care Team:    Randall An, PA-C   Jacolyn Reedy, PA-C       Other Instructions BRING YOUR MEDICINES TO NURSE VISIT ON Thursday MORNING

## 2020-04-12 ENCOUNTER — Telehealth: Payer: Self-pay | Admitting: Cardiology

## 2020-04-12 DIAGNOSIS — I739 Peripheral vascular disease, unspecified: Secondary | ICD-10-CM | POA: Diagnosis not present

## 2020-04-12 DIAGNOSIS — E1151 Type 2 diabetes mellitus with diabetic peripheral angiopathy without gangrene: Secondary | ICD-10-CM | POA: Diagnosis not present

## 2020-04-12 DIAGNOSIS — N1832 Chronic kidney disease, stage 3b: Secondary | ICD-10-CM | POA: Diagnosis not present

## 2020-04-12 DIAGNOSIS — M79671 Pain in right foot: Secondary | ICD-10-CM | POA: Diagnosis not present

## 2020-04-12 DIAGNOSIS — L03031 Cellulitis of right toe: Secondary | ICD-10-CM | POA: Diagnosis not present

## 2020-04-12 DIAGNOSIS — L600 Ingrowing nail: Secondary | ICD-10-CM | POA: Diagnosis not present

## 2020-04-12 DIAGNOSIS — M79674 Pain in right toe(s): Secondary | ICD-10-CM | POA: Diagnosis not present

## 2020-04-12 DIAGNOSIS — I480 Paroxysmal atrial fibrillation: Secondary | ICD-10-CM | POA: Diagnosis not present

## 2020-04-12 NOTE — Telephone Encounter (Signed)
ASPIRIN 81 MG POTASSIUM CL 20 MEQ  1 TABLET DAILY  METOPROLOL  TARTRATE 50MG  2X A DAY  ELIQUIS 2.5 MG 2X A DAY LUBIPROSTONE 24 MCG 1 TAB 2X A DAY  METOPROLOL 25MG  2X A DAY  METFORMIN 500MG  1TAB 2X A DAY (2 SEPARATE BOTTLES)  LOSARTAN 25MG  1/2 TABLET DAILY ATORVASTATIN 20 MG 1 TABLET IN PM  FUROSEMIDE 40MG  1 TABLET DAILY    PATIENT BROUGHT HIS BAG OF MEDICATION BY SO THAT WE HAVE A ACCURATE LIST OF WHAT HE IS TAKING.   LUKE ASKED PT TO BRING BY?

## 2020-04-13 ENCOUNTER — Ambulatory Visit: Payer: Medicare Other

## 2020-04-13 ENCOUNTER — Telehealth: Payer: Self-pay | Admitting: *Deleted

## 2020-04-13 LAB — BASIC METABOLIC PANEL
BUN/Creatinine Ratio: 15 (calc) (ref 6–22)
BUN: 23 mg/dL (ref 7–25)
CO2: 24 mmol/L (ref 20–32)
Calcium: 9.6 mg/dL (ref 8.6–10.3)
Chloride: 105 mmol/L (ref 98–110)
Creat: 1.5 mg/dL — ABNORMAL HIGH (ref 0.70–1.11)
Glucose, Bld: 185 mg/dL — ABNORMAL HIGH (ref 65–99)
Potassium: 3.9 mmol/L (ref 3.5–5.3)
Sodium: 141 mmol/L (ref 135–146)

## 2020-04-13 NOTE — Telephone Encounter (Signed)
-----   Message from Abelino Derrick, [place] sent at 04/13/2020  8:46 AM EST ----- Bradly Bienenstock can you please update the patient's corrected medicine list and let the patient know no changes needed.   Thanks  Corine Shelter PA-C 04/13/2020 8:45 AM

## 2020-04-13 NOTE — Telephone Encounter (Signed)
Medications updated in chart.

## 2020-04-18 ENCOUNTER — Ambulatory Visit: Payer: Medicare Other | Admitting: Internal Medicine

## 2020-04-19 ENCOUNTER — Encounter: Payer: Self-pay | Admitting: *Deleted

## 2020-04-28 ENCOUNTER — Emergency Department (HOSPITAL_COMMUNITY): Payer: Medicare Other

## 2020-04-28 ENCOUNTER — Other Ambulatory Visit: Payer: Self-pay

## 2020-04-28 ENCOUNTER — Inpatient Hospital Stay (HOSPITAL_COMMUNITY)
Admission: EM | Admit: 2020-04-28 | Discharge: 2020-05-04 | DRG: 280 | Disposition: A | Discharge status: Hospice | Payer: Medicare Other | Attending: Internal Medicine | Admitting: Internal Medicine

## 2020-04-28 ENCOUNTER — Encounter (HOSPITAL_COMMUNITY): Payer: Self-pay | Admitting: *Deleted

## 2020-04-28 ENCOUNTER — Emergency Department (HOSPITAL_COMMUNITY)
Admission: EM | Admit: 2020-04-28 | Discharge: 2020-04-28 | Disposition: A | Discharge status: Home / Self Care | Payer: Medicare Other | Source: Home / Self Care | Attending: Emergency Medicine | Admitting: Emergency Medicine

## 2020-04-28 ENCOUNTER — Encounter (HOSPITAL_COMMUNITY): Payer: Self-pay | Admitting: Emergency Medicine

## 2020-04-28 DIAGNOSIS — K2900 Acute gastritis without bleeding: Secondary | ICD-10-CM

## 2020-04-28 DIAGNOSIS — Z7982 Long term (current) use of aspirin: Secondary | ICD-10-CM | POA: Insufficient documentation

## 2020-04-28 DIAGNOSIS — K6389 Other specified diseases of intestine: Secondary | ICD-10-CM | POA: Diagnosis not present

## 2020-04-28 DIAGNOSIS — Z7984 Long term (current) use of oral hypoglycemic drugs: Secondary | ICD-10-CM | POA: Insufficient documentation

## 2020-04-28 DIAGNOSIS — R109 Unspecified abdominal pain: Secondary | ICD-10-CM | POA: Diagnosis not present

## 2020-04-28 DIAGNOSIS — E1169 Type 2 diabetes mellitus with other specified complication: Secondary | ICD-10-CM | POA: Insufficient documentation

## 2020-04-28 DIAGNOSIS — Z79899 Other long term (current) drug therapy: Secondary | ICD-10-CM

## 2020-04-28 DIAGNOSIS — I878 Other specified disorders of veins: Secondary | ICD-10-CM | POA: Diagnosis not present

## 2020-04-28 DIAGNOSIS — K7200 Acute and subacute hepatic failure without coma: Secondary | ICD-10-CM | POA: Diagnosis not present

## 2020-04-28 DIAGNOSIS — N1832 Chronic kidney disease, stage 3b: Secondary | ICD-10-CM | POA: Insufficient documentation

## 2020-04-28 DIAGNOSIS — R570 Cardiogenic shock: Secondary | ICD-10-CM | POA: Diagnosis present

## 2020-04-28 DIAGNOSIS — Z87891 Personal history of nicotine dependence: Secondary | ICD-10-CM | POA: Insufficient documentation

## 2020-04-28 DIAGNOSIS — I429 Cardiomyopathy, unspecified: Secondary | ICD-10-CM | POA: Diagnosis present

## 2020-04-28 DIAGNOSIS — E785 Hyperlipidemia, unspecified: Secondary | ICD-10-CM | POA: Diagnosis present

## 2020-04-28 DIAGNOSIS — E860 Dehydration: Secondary | ICD-10-CM | POA: Diagnosis present

## 2020-04-28 DIAGNOSIS — N183 Chronic kidney disease, stage 3 unspecified: Secondary | ICD-10-CM | POA: Diagnosis present

## 2020-04-28 DIAGNOSIS — R1084 Generalized abdominal pain: Secondary | ICD-10-CM | POA: Diagnosis not present

## 2020-04-28 DIAGNOSIS — I517 Cardiomegaly: Secondary | ICD-10-CM | POA: Diagnosis not present

## 2020-04-28 DIAGNOSIS — Z20822 Contact with and (suspected) exposure to covid-19: Secondary | ICD-10-CM | POA: Diagnosis not present

## 2020-04-28 DIAGNOSIS — I5043 Acute on chronic combined systolic (congestive) and diastolic (congestive) heart failure: Secondary | ICD-10-CM | POA: Diagnosis not present

## 2020-04-28 DIAGNOSIS — Z515 Encounter for palliative care: Secondary | ICD-10-CM | POA: Diagnosis not present

## 2020-04-28 DIAGNOSIS — Z833 Family history of diabetes mellitus: Secondary | ICD-10-CM

## 2020-04-28 DIAGNOSIS — N179 Acute kidney failure, unspecified: Secondary | ICD-10-CM | POA: Diagnosis present

## 2020-04-28 DIAGNOSIS — I4891 Unspecified atrial fibrillation: Secondary | ICD-10-CM | POA: Diagnosis present

## 2020-04-28 DIAGNOSIS — K5900 Constipation, unspecified: Secondary | ICD-10-CM | POA: Insufficient documentation

## 2020-04-28 DIAGNOSIS — E1122 Type 2 diabetes mellitus with diabetic chronic kidney disease: Secondary | ICD-10-CM | POA: Insufficient documentation

## 2020-04-28 DIAGNOSIS — I214 Non-ST elevation (NSTEMI) myocardial infarction: Secondary | ICD-10-CM | POA: Diagnosis not present

## 2020-04-28 DIAGNOSIS — R7401 Elevation of levels of liver transaminase levels: Secondary | ICD-10-CM

## 2020-04-28 DIAGNOSIS — E1165 Type 2 diabetes mellitus with hyperglycemia: Secondary | ICD-10-CM | POA: Diagnosis present

## 2020-04-28 DIAGNOSIS — I5042 Chronic combined systolic (congestive) and diastolic (congestive) heart failure: Secondary | ICD-10-CM | POA: Diagnosis present

## 2020-04-28 DIAGNOSIS — I130 Hypertensive heart and chronic kidney disease with heart failure and stage 1 through stage 4 chronic kidney disease, or unspecified chronic kidney disease: Secondary | ICD-10-CM | POA: Insufficient documentation

## 2020-04-28 DIAGNOSIS — Z66 Do not resuscitate: Secondary | ICD-10-CM | POA: Diagnosis not present

## 2020-04-28 DIAGNOSIS — Z89612 Acquired absence of left leg above knee: Secondary | ICD-10-CM

## 2020-04-28 DIAGNOSIS — Z7901 Long term (current) use of anticoagulants: Secondary | ICD-10-CM | POA: Insufficient documentation

## 2020-04-28 DIAGNOSIS — Z9049 Acquired absence of other specified parts of digestive tract: Secondary | ICD-10-CM | POA: Diagnosis not present

## 2020-04-28 DIAGNOSIS — I5023 Acute on chronic systolic (congestive) heart failure: Secondary | ICD-10-CM | POA: Diagnosis present

## 2020-04-28 DIAGNOSIS — I4819 Other persistent atrial fibrillation: Secondary | ICD-10-CM | POA: Diagnosis present

## 2020-04-28 DIAGNOSIS — E1151 Type 2 diabetes mellitus with diabetic peripheral angiopathy without gangrene: Secondary | ICD-10-CM | POA: Diagnosis present

## 2020-04-28 DIAGNOSIS — K2980 Duodenitis without bleeding: Secondary | ICD-10-CM

## 2020-04-28 DIAGNOSIS — I959 Hypotension, unspecified: Secondary | ICD-10-CM

## 2020-04-28 DIAGNOSIS — E119 Type 2 diabetes mellitus without complications: Secondary | ICD-10-CM

## 2020-04-28 DIAGNOSIS — E875 Hyperkalemia: Secondary | ICD-10-CM | POA: Diagnosis not present

## 2020-04-28 DIAGNOSIS — R0600 Dyspnea, unspecified: Secondary | ICD-10-CM

## 2020-04-28 DIAGNOSIS — E872 Acidosis: Secondary | ICD-10-CM | POA: Diagnosis present

## 2020-04-28 DIAGNOSIS — Z8711 Personal history of peptic ulcer disease: Secondary | ICD-10-CM

## 2020-04-28 DIAGNOSIS — Z8249 Family history of ischemic heart disease and other diseases of the circulatory system: Secondary | ICD-10-CM

## 2020-04-28 DIAGNOSIS — D509 Iron deficiency anemia, unspecified: Secondary | ICD-10-CM | POA: Diagnosis present

## 2020-04-28 LAB — COMPREHENSIVE METABOLIC PANEL
ALT: 36 U/L (ref 0–44)
ALT: 426 U/L — ABNORMAL HIGH (ref 0–44)
AST: 55 U/L — ABNORMAL HIGH (ref 15–41)
AST: 672 U/L — ABNORMAL HIGH (ref 15–41)
Albumin: 4.1 g/dL (ref 3.5–5.0)
Albumin: 3.9 g/dL (ref 3.5–5.0)
Alkaline Phosphatase: 93 U/L (ref 38–126)
Alkaline Phosphatase: 131 U/L — ABNORMAL HIGH (ref 38–126)
Anion gap: 13 (ref 5–15)
Anion gap: 14 (ref 5–15)
BUN: 40 mg/dL — ABNORMAL HIGH (ref 8–23)
BUN: 54 mg/dL — ABNORMAL HIGH (ref 8–23)
CO2: 19 mmol/L — ABNORMAL LOW (ref 22–32)
CO2: 13 mmol/L — ABNORMAL LOW (ref 22–32)
Calcium: 9.5 mg/dL (ref 8.9–10.3)
Calcium: 8.8 mg/dL — ABNORMAL LOW (ref 8.9–10.3)
Chloride: 100 mmol/L (ref 98–111)
Chloride: 102 mmol/L (ref 98–111)
Creatinine, Ser: 1.78 mg/dL — ABNORMAL HIGH (ref 0.61–1.24)
Creatinine, Ser: 2.58 mg/dL — ABNORMAL HIGH (ref 0.61–1.24)
GFR, Estimated: 36 mL/min — ABNORMAL LOW (ref >60)
GFR, Estimated: 23 mL/min — ABNORMAL LOW (ref >60)
Glucose, Bld: 185 mg/dL — ABNORMAL HIGH (ref 70–99)
Glucose, Bld: 206 mg/dL — ABNORMAL HIGH (ref 70–99)
Potassium: 5.2 mmol/L — ABNORMAL HIGH (ref 3.5–5.1)
Potassium: 6.6 mmol/L (ref 3.5–5.1)
Sodium: 132 mmol/L — ABNORMAL LOW (ref 135–145)
Sodium: 129 mmol/L — ABNORMAL LOW (ref 135–145)
Total Bilirubin: 2 mg/dL — ABNORMAL HIGH (ref 0.3–1.2)
Total Bilirubin: 2.3 mg/dL — ABNORMAL HIGH (ref 0.3–1.2)
Total Protein: 8 g/dL (ref 6.5–8.1)
Total Protein: 7.9 g/dL (ref 6.5–8.1)

## 2020-04-28 LAB — URINALYSIS, ROUTINE W REFLEX MICROSCOPIC
Bacteria, UA: NONE SEEN
Bilirubin Urine: NEGATIVE
Glucose, UA: NEGATIVE mg/dL
Ketones, ur: NEGATIVE mg/dL
Leukocytes,Ua: NEGATIVE
Nitrite: NEGATIVE
Protein, ur: 100 mg/dL — AB
Specific Gravity, Urine: 1.019 (ref 1.005–1.030)
pH: 5 (ref 5.0–8.0)

## 2020-04-28 LAB — CBC WITH DIFFERENTIAL/PLATELET
Abs Immature Granulocytes: 0.01 10*3/uL (ref 0.00–0.07)
Abs Immature Granulocytes: 0.04 10*3/uL (ref 0.00–0.07)
Basophils Absolute: 0 10*3/uL (ref 0.0–0.1)
Basophils Absolute: 0 10*3/uL (ref 0.0–0.1)
Basophils Relative: 0 %
Basophils Relative: 0 %
Eosinophils Absolute: 0 10*3/uL (ref 0.0–0.5)
Eosinophils Absolute: 0 10*3/uL (ref 0.0–0.5)
Eosinophils Relative: 0 %
Eosinophils Relative: 0 %
HCT: 38 % — ABNORMAL LOW (ref 39.0–52.0)
HCT: 37.1 % — ABNORMAL LOW (ref 39.0–52.0)
Hemoglobin: 12 g/dL — ABNORMAL LOW (ref 13.0–17.0)
Hemoglobin: 11.7 g/dL — ABNORMAL LOW (ref 13.0–17.0)
Immature Granulocytes: 0 %
Immature Granulocytes: 1 %
Lymphocytes Relative: 21 %
Lymphocytes Relative: 16 %
Lymphs Abs: 1.2 10*3/uL (ref 0.7–4.0)
Lymphs Abs: 1.3 10*3/uL (ref 0.7–4.0)
MCH: 30 pg (ref 26.0–34.0)
MCH: 30.5 pg (ref 26.0–34.0)
MCHC: 31.6 g/dL (ref 30.0–36.0)
MCHC: 31.5 g/dL (ref 30.0–36.0)
MCV: 95 fL (ref 80.0–100.0)
MCV: 96.6 fL (ref 80.0–100.0)
Monocytes Absolute: 0.3 10*3/uL (ref 0.1–1.0)
Monocytes Absolute: 0.6 10*3/uL (ref 0.1–1.0)
Monocytes Relative: 5 %
Monocytes Relative: 8 %
Neutro Abs: 4.5 10*3/uL (ref 1.7–7.7)
Neutro Abs: 6.1 10*3/uL (ref 1.7–7.7)
Neutrophils Relative %: 74 %
Neutrophils Relative %: 75 %
Platelets: 232 10*3/uL (ref 150–400)
Platelets: 204 10*3/uL (ref 150–400)
RBC: 4 MIL/uL — ABNORMAL LOW (ref 4.22–5.81)
RBC: 3.84 MIL/uL — ABNORMAL LOW (ref 4.22–5.81)
RDW: 14.8 % (ref 11.5–15.5)
RDW: 15.3 % (ref 11.5–15.5)
WBC: 6.1 10*3/uL (ref 4.0–10.5)
WBC: 8.2 10*3/uL (ref 4.0–10.5)
nRBC: 0 % (ref 0.0–0.2)
nRBC: 0.2 % (ref 0.0–0.2)

## 2020-04-28 LAB — LIPASE, BLOOD: Lipase: 26 U/L (ref 11–51)

## 2020-04-28 LAB — RESP PANEL BY RT-PCR (FLU A&B, COVID) ARPGX2
Influenza A by PCR: NEGATIVE
Influenza B by PCR: NEGATIVE
SARS Coronavirus 2 by RT PCR: NEGATIVE

## 2020-04-28 LAB — LACTIC ACID, PLASMA: Lactic Acid, Venous: 6.5 mmol/L (ref 0.5–1.9)

## 2020-04-28 MED ORDER — IOHEXOL 300 MG/ML  SOLN
75.0000 mL | Freq: Once | INTRAMUSCULAR | Status: AC | PRN
  Administered 2020-04-28: 75 mL via INTRAVENOUS

## 2020-04-28 MED ORDER — SODIUM CHLORIDE 0.9 % IV BOLUS (SEPSIS)
500.0000 mL | Freq: Once | INTRAVENOUS | Status: AC
  Administered 2020-04-28: 500 mL via INTRAVENOUS

## 2020-04-28 MED ORDER — METOPROLOL TARTRATE 5 MG/5ML IV SOLN
5.0000 mg | Freq: Once | INTRAVENOUS | Status: AC
  Administered 2020-04-28: 5 mg via INTRAVENOUS
  Filled 2020-04-28: qty 5

## 2020-04-28 MED ORDER — SODIUM CHLORIDE 0.9 % IV BOLUS
500.0000 mL | Freq: Once | INTRAVENOUS | Status: AC
  Administered 2020-04-28: 500 mL via INTRAVENOUS

## 2020-04-28 MED ORDER — PANTOPRAZOLE SODIUM 40 MG PO TBEC: 40.0000 mg | DELAYED_RELEASE_TABLET | Freq: Every day | ORAL | 1 refills | Status: AC

## 2020-04-28 MED ORDER — PANTOPRAZOLE SODIUM 40 MG IV SOLR
40.0000 mg | Freq: Once | INTRAVENOUS | Status: AC
  Administered 2020-04-28: 40 mg via INTRAVENOUS
  Filled 2020-04-28: qty 40

## 2020-04-28 MED ORDER — FENTANYL CITRATE (PF) 100 MCG/2ML IJ SOLN
100.0000 ug | Freq: Once | INTRAMUSCULAR | Status: AC
  Administered 2020-04-28: 100 ug via INTRAVENOUS
  Filled 2020-04-28: qty 2

## 2020-04-28 MED ORDER — FENTANYL CITRATE (PF) 100 MCG/2ML IJ SOLN
50.0000 ug | Freq: Once | INTRAMUSCULAR | Status: DC
  Filled 2020-04-28: qty 2

## 2020-04-28 NOTE — ED Notes (Signed)
Date and time results received: 04/28/20 2356   Test: K+ Critical Value: 6.6  Name of Provider Notified: Benjiman Core MD

## 2020-04-28 NOTE — ED Notes (Signed)
Pt given ice water and is tolerating PO well. No difficulty or complaints at this time. Will continue to monitor.

## 2020-04-28 NOTE — ED Notes (Addendum)
Date and time results received: 04/28/20 2338   Test: LACTIC Critical Value: 6.5  Name of Provider Notified: Benjiman Core MD

## 2020-04-28 NOTE — ED Triage Notes (Signed)
Pt c/o abd pain and swelling for the past few days. Also c/o headache.

## 2020-04-28 NOTE — ED Provider Notes (Signed)
[facility] EMERGENCY DEPARTMENT Provider Note   CSN: [id number] Arrival date & time: 04/28/20  0303     History Chief Complaint  Patient presents with  . Abdominal Pain    Warren Mcdonald is a 86 y.o. male.  The history is provided by the patient.  Abdominal Pain Pain location:  Generalized Pain quality: aching and bloating   Pain radiates to:  Does not radiate Pain severity:  Moderate Onset quality:  Gradual Duration:  2 days Timing:  Intermittent Progression:  Worsening Chronicity:  New Relieved by:  Nothing Worsened by:  Movement and palpation Associated symptoms: constipation   Associated symptoms: no chest pain, no diarrhea, no dysuria, no fever and no vomiting    Patient history of hypertension, hyperlipidemia, atrial fibrillation presents with abdominal pain and bloating for the past 2 days.  He also reports constipation.  No vomiting.  Patient reports he feels like he has gas in his abdomen    Past Medical History:  Diagnosis Date  . Abdominal distension (gaseous)   . Anemia, unspecified   . Chronic diastolic (congestive) heart failure (HCC)   . Essential (primary) hypertension   . Hyperlipidemia   . PAF (paroxysmal atrial fibrillation) (HCC)   . Peripheral vascular disease (HCC) 2011   Left above-knee popliteal to posterior tibial artery bypass   . PUD (peptic ulcer disease)   . Type 2 diabetes mellitus with diabetic peripheral angiopathy without gangrene [facility])     Patient Active Problem List   Diagnosis Date Noted  . Encounter to establish care 03/07/2020  . Constipation 03/07/2020  . CKD (chronic kidney disease) stage 3, GFR 30-59 ml/min (HCC) 03/07/2020  . [name] (heart failure with reduced ejection fraction) (HCC) 02/20/2020  . S/P AKA (above knee amputation) (HCC) 08/09/2019  . PAF (paroxysmal atrial fibrillation) (HCC)   . Nonhealing surgical wound 06/25/2019  . Essential (primary) hypertension   . Hyperlipidemia, unspecified   . Type 2  diabetes mellitus with diabetic peripheral angiopathy without gangrene (HCC)   . Chronic combined systolic and diastolic congestive heart failure (HCC)   . Acute diastolic heart failure (HCC) 09/12/2014  . Cholecystitis, acute 09/06/2014  . Atrial fibrillation with rapid ventricular response (HCC) 09/06/2014  . 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 Abdominal pain 09/02/2014  . DM (diabetes mellitus) (HCC) 09/02/2014  . Pain in limb 12/27/2013  . Aftercare following surgery of the circulatory system, NEC 06/28/2013  . Atherosclerosis of native artery of extremity with intermittent claudication (HCC) 06/28/2013  . Peripheral vascular disease (HCC) 12/28/2012  . PAD (peripheral artery disease) (HCC) 12/23/2011    Past Surgical History:  Procedure Laterality Date  . ABDOMINAL AORTOGRAM N/A 05/24/2019   Procedure: ABDOMINAL AORTOGRAM;  Surgeon: Maeola Harman, MD;  Location: [facility] INVASIVE CV LAB;  Service: Cardiovascular;  Laterality: N/A;  . AMPUTATION Left 06/16/2019   Procedure: AMPUTATION DIGIT FOURTH TOE LEFT FOOT;  Surgeon: Nada Libman, MD;  Location: [facility] OR;  Service: Vascular;  Laterality: Left;  . AMPUTATION Left 06/26/2019   Procedure: AMPUTATION two DIGITs, left toe;  Surgeon: Maeola Harman, MD;  Location: [facility] OR;  Service: Vascular;  Laterality: Left;  . AMPUTATION Left 07/02/2019   Procedure: left AMPUTATION ABOVE KNEE;  Surgeon: Larina Earthly, MD;  Location: [facility] OR;  Service: Vascular;  Laterality: Left;  . APPENDECTOMY    . CATARACT EXTRACTION W/ INTRAOCULAR LENS  IMPLANT, BILATERAL    . CHOLECYSTECTOMY N/A 09/08/2014   Procedure: CHOLECYSTECTOMY;  Surgeon: Franky Macho Md, MD;  Location: [facility] ORS;  Service: General;  Laterality: N/A;  . EYE SURGERY    . I & D EXTREMITY Left 06/30/2019   Procedure: sharp incisonal DEBRIDEMENT of left foot including bone, tendon, muscle, and skin;  Surgeon: Cephus Shelling, MD;  Location: [facility] OR;  Service: Vascular;  Laterality:  Left;  . LOWER EXTREMITY ANGIOGRAPHY Left 05/24/2019   Procedure: Lower Extremity Angiography;  Surgeon: Maeola Harman, MD;  Location: [facility] INVASIVE CV LAB;  Service: Cardiovascular;  Laterality: Left;  . PERIPHERAL VASCULAR BALLOON ANGIOPLASTY Left 05/24/2019   Procedure: PERIPHERAL VASCULAR BALLOON ANGIOPLASTY;  Surgeon: Maeola Harman, MD;  Location: [facility] INVASIVE CV LAB;  Service: Cardiovascular;  Laterality: Left;  tibial bypass  . PR VEIN BYPASS GRAFT,AORTO-FEM-POP     Left above knee popliteal to posterior tibial artery bypass graft       Family History  Problem Relation Age of Onset  . Heart disease Mother   . Hypertension Mother   . Heart attack Mother   . Heart attack Father   . Heart disease Father        Heart disease before age 84  . Cancer Sister   . Deep vein thrombosis Sister   . Diabetes Sister        Amputation-right leg  . Cancer Daughter   . Diabetes Daughter   . Cancer Brother   . Diabetes Other   . Prostate cancer Other     Social History   Tobacco Use  . Smoking status: Former Smoker    Types: Cigars    Quit date: 05/27/1988    Years since quitting: 31.9  . Smokeless tobacco: Former Neurosurgeon    Types: Chew    Quit date: 05/27/1988  Vaping Use  . Vaping Use: Never used  Substance Use Topics  . Alcohol use: No    Alcohol/week: 0.0 standard drinks  . Drug use: No    Home Medications Prior to Admission medications   Medication Sig Start Date End Date Taking? Authorizing Provider  apixaban (ELIQUIS) 2.5 MG TABS tablet Take 1 tablet (2.5 mg total) by mouth 2 (two) times daily. 03/27/20   Dyann Kief, PA-C  aspirin EC 81 MG tablet Take 81 mg by mouth daily. Swallow whole.    [provider]  atorvastatin (LIPITOR) 20 MG tablet Take 1 tablet (20 mg total) by mouth daily at 6 PM. 03/07/20   Anabel Halon, MD  furosemide (LASIX) 40 MG tablet Take 1 tablet (40 mg total) by mouth daily. 02/22/20 02/21/21  Burnadette Pop, MD    losartan (COZAAR) 25 MG tablet Take 12.5 mg by mouth daily.    [provider]  lubiprostone (AMITIZA) 24 MCG capsule Take 1 capsule (24 mcg total) by mouth 2 (two) times daily with a meal. 03/07/20   Allena Katz, Earlie Lou, MD  metFORMIN (GLUCOPHAGE) 500 MG tablet Take 1 tablet (500 mg total) by mouth 2 (two) times daily with a meal. 03/09/20   Anabel Halon, MD  metoprolol tartrate (LOPRESSOR) 25 MG tablet Take 25 mg by mouth 2 (two) times daily.    [provider]  metoprolol tartrate (LOPRESSOR) 50 MG tablet Take 50 mg by mouth 2 (two) times daily.    [provider]  potassium chloride 20 MEQ TBCR Take 20 mEq by mouth daily. 02/22/20   Burnadette Pop, MD  senna (SENOKOT) 8.6 MG TABS tablet Take 1 tablet (8.6 mg total) by mouth daily as needed for mild constipation. 03/07/20  Anabel Halon, MD  silver sulfADIAZINE (SILVADENE) 1 % cream Apply topically daily. 03/24/20   [provider]    Allergies    Patient has no known allergies.  Review of Systems   Review of Systems  Constitutional: Negative for fever.  Cardiovascular: Negative for chest pain.  Gastrointestinal: Positive for abdominal pain and constipation. Negative for diarrhea and vomiting.  Genitourinary: Positive for difficulty urinating. Negative for dysuria.  All other systems reviewed and are negative.   Physical Exam Updated Vital Signs BP 104/78 (BP Location: Left Arm)   Pulse (!) 105   Temp 97.9 F (36.6 C) (Oral)   Resp 19   Ht 1.854 m (6\' 1" )   Wt 97.5 kg   SpO2 96%   BMI 28.37 kg/m   Physical Exam CONSTITUTIONAL: Elderly, no acute distress HEAD: Normocephalic/atraumatic EYES: EOMI/PERRL ENMT: Mucous membranes moist NECK: supple no meningeal signs CV: Irregular, no loud murmur LUNGS: Lungs are clear to auscultation bilaterally, no apparent distress ABDOMEN: soft, distended, multiple healed surgical incisions, decreased bowel sounds noted, diffuse mild tenderness GU:no  cva tenderness NEURO: Pt is awake/alert/appropriate, moves all extremitiesx4.  No facial droop.   EXTREMITIES: Left AKA noted, right lower extremity is wrapped in a bandage SKIN: warm, color normal PSYCH: no abnormalities of mood noted, alert and oriented to situation  ED Results / Procedures / Treatments   Labs (all labs ordered are listed, but only abnormal results are displayed) Labs Reviewed  CBC WITH DIFFERENTIAL/PLATELET - Abnormal; Notable for the following components:      Result Value   RBC 4.00 (*)    Hemoglobin 12.0 (*)    HCT 38.0 (*)    All other components within normal limits  COMPREHENSIVE METABOLIC PANEL - Abnormal; Notable for the following components:   Sodium 132 (*)    Potassium 5.2 (*)    CO2 19 (*)    Glucose, Bld 185 (*)    BUN 40 (*)    Creatinine, Ser 1.78 (*)    AST 55 (*)    Total Bilirubin 2.0 (*)    GFR, Estimated 36 (*)    All other components within normal limits  URINALYSIS, ROUTINE W REFLEX MICROSCOPIC - Abnormal; Notable for the following components:   APPearance HAZY (*)    Hgb urine dipstick SMALL (*)    Protein, ur 100 (*)    All other components within normal limits  LIPASE, BLOOD    EKG  ED ECG REPORT   Date: 04/28/2020 0427am  Rate: 111  Rhythm: atrial fibrillation  QRS Axis: left  Intervals: normal  ST/T Wave abnormalities: nonspecific ST changes  Conduction Disutrbances:none  Narrative Interpretation:   Old EKG Reviewed: unchanged  I have personally reviewed the EKG tracing and agree with the computerized printout as noted.  Radiology DG ABD ACUTE 2+V W 1V CHEST  Result Date: 04/28/2020 CLINICAL DATA:  Abdominal pain EXAM: DG ABDOMEN ACUTE WITH 1 VIEW CHEST COMPARISON:  CT 02/20/2020 FINDINGS: Lungs are clear. No pneumothorax or pleural effusion. Mild cardiomegaly is present. Pulmonary vascularity is normal. Normal abdominal gas pattern with mild gaseous distension of a redundant sigmoid colon, similar to that noted on  prior CT examination. No free intraperitoneal gas. Underpenetration precludes evaluation of the visceral shadows. Cholecystectomy clips seen in the right upper quadrant. Phleboliths noted within the pelvis. IMPRESSION: Negative abdominal radiographs.  No acute cardiopulmonary disease. Electronically Signed   By: 02/22/2020 MD   On: 04/28/2020 06:23    Procedures Procedures  Medications Ordered in ED Medications  fentaNYL (SUBLIMAZE) injection 50 mcg (has no administration in time range)  fentaNYL (SUBLIMAZE) injection 100 mcg (100 mcg Intravenous Given 04/28/20 0539)  sodium chloride 0.9 % bolus 500 mL (500 mLs Intravenous New Bag/Given 04/28/20 0622)    ED Course  I have reviewed the triage vital signs and the nursing notes.  Pertinent labs & imaging results that were available during my care of the patient were reviewed by me and considered in my medical decision making (see chart for details).    MDM Rules/Calculators/A&P                           This patient presents to the ED for concern of abdominal pain, this involves an extensive number of treatment options, and is a complaint that carries with it a high risk of complications and morbidity.  The differential diagnosis includes bowel obstruction, bowel perforation, UTI, kidney stone, pancreatitis   Lab Tests:   I Ordered, reviewed, and interpreted labs, which included cbc, cmp, lipase, urinalysis  Medicines ordered:   I ordered medication fentanyl  For pain   Imaging Studies ordered:   I ordered imaging studies which included acute abdominal series   I independently visualized and interpreted imaging which showed gasous distention, no other findings  Additional history obtained:   Additional history obtained from wife  Previous records obtained and reviewed   Reevaluation:  After the interventions stated above, I reevaluated the patient and found pt with continued abdominal pain  7:25 AM  . Patient  with history of multiple abdominal surgeries, vasculopath with previous aortofemoral/pop bypass, presented with increasing abdominal pain.  Patient reports feeling that he has gas in his abdomen and is constipated.  Abdominal plain film reveals gaseous distention but no other findings.  Patient has continued abdominal tenderness on exam.  He is also dehydrated and IV fluids ordered.  He did attempt PO challenge with minimal improvement  7:26 AM Will proceed with CT imaging.  Signed out to Dr. Estell Harpin with Ct imaging pending Final Clinical Impression(s) / ED Diagnoses Final diagnoses:  AKI (acute kidney injury) (HCC)  Dehydration    Rx / DC Orders ED Discharge Orders    None       Zadie Rhine, MD 04/28/20 [phone]

## 2020-04-28 NOTE — ED Provider Notes (Addendum)
[facility] EMERGENCY DEPARTMENT Provider Note   CSN: [id number] Arrival date & time: 04/28/20  2042     History Chief Complaint  Patient presents with  . Abdominal Pain    Warren Mcdonald is a 86 y.o. male.  HPI Patient presents abdominal pain.  Seen last night.  States continues have lower abdominal pain but now also hurting up in his neck and he has a dull headache.  No fevers.  History of atrial fibrillation.  Had CT scan done yesterday that showed potential duodenitis.  States he really has not been able to eat anything at home.  No appetite.  He is on anticoagulation for A. fib.  Pain is in the abdomen and diffusely.  Neck pain is dull.  No fevers.  Headache is also dull.    Past Medical History:  Diagnosis Date  . Abdominal distension (gaseous)   . Anemia, unspecified   . Chronic diastolic (congestive) heart failure (HCC)   . Essential (primary) hypertension   . Hyperlipidemia   . PAF (paroxysmal atrial fibrillation) (HCC)   . Peripheral vascular disease (HCC) 2011   Left above-knee popliteal to posterior tibial artery bypass   . PUD (peptic ulcer disease)   . Type 2 diabetes mellitus with diabetic peripheral angiopathy without gangrene [facility])     Patient Active Problem List   Diagnosis Date Noted  . Encounter to establish care 03/07/2020  . Constipation 03/07/2020  . CKD (chronic kidney disease) stage 3, GFR 30-59 ml/min (HCC) 03/07/2020  . [name] (heart failure with reduced ejection fraction) (HCC) 02/20/2020  . S/P AKA (above knee amputation) (HCC) 08/09/2019  . PAF (paroxysmal atrial fibrillation) (HCC)   . Nonhealing surgical wound 06/25/2019  . Essential (primary) hypertension   . Hyperlipidemia, unspecified   . Type 2 diabetes mellitus with diabetic peripheral angiopathy without gangrene (HCC)   . Chronic combined systolic and diastolic congestive heart failure (HCC)   . Acute diastolic heart failure (HCC) 09/12/2014  . Cholecystitis, acute 09/06/2014  .  Atrial fibrillation with rapid ventricular response (HCC) 09/06/2014  . 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 Abdominal pain 09/02/2014  . DM (diabetes mellitus) (HCC) 09/02/2014  . Pain in limb 12/27/2013  . Aftercare following surgery of the circulatory system, NEC 06/28/2013  . Atherosclerosis of native artery of extremity with intermittent claudication (HCC) 06/28/2013  . Peripheral vascular disease (HCC) 12/28/2012  . PAD (peripheral artery disease) (HCC) 12/23/2011    Past Surgical History:  Procedure Laterality Date  . ABDOMINAL AORTOGRAM N/A 05/24/2019   Procedure: ABDOMINAL AORTOGRAM;  Surgeon: Maeola Harman, MD;  Location[facility] INVASIVE CV LAB;  Service: Cardiovascular;  Laterality: N/A;  . AMPUTATION Left 06/16/2019   Procedure: AMPUTATION DIGIT FOURTH TOE LEFT FOOT;  Surgeon: Nada Libman, MD;  Location: [facility] OR;  Service: Vascular;  Laterality: Left;  . AMPUTATION Left 06/26/2019   Procedure: AMPUTATION two DIGITs, left toe;  Surgeon: Maeola Harman, MD;  Location: [facility] OR;  Service: Vascular;  Laterality: Left;  . AMPUTATION Left 07/02/2019   Procedure: left AMPUTATION ABOVE KNEE;  Surgeon: Larina Earthly, MD;  Location: [facility] OR;  Service: Vascular;  Laterality: Left;  . APPENDECTOMY    . CATARACT EXTRACTION W/ INTRAOCULAR LENS  IMPLANT, BILATERAL    . CHOLECYSTECTOMY N/A 09/08/2014   Procedure: CHOLECYSTECTOMY;  Surgeon: Franky Macho Md, MD;  Location: [facility] ORS;  Service: General;  Laterality: N/A;  . EYE SURGERY    . I & D EXTREMITY Left 06/30/2019  Procedure: sharp incisonal DEBRIDEMENT of left foot including bone, tendon, muscle, and skin;  Surgeon: Cephus Shelling, MD;  Location: [facility] OR;  Service: Vascular;  Laterality: Left;  . LOWER EXTREMITY ANGIOGRAPHY Left 05/24/2019   Procedure: Lower Extremity Angiography;  Surgeon: Maeola Harman, MD;  Location: [facility] INVASIVE CV LAB;  Service: Cardiovascular;  Laterality: Left;  . PERIPHERAL VASCULAR BALLOON  ANGIOPLASTY Left 05/24/2019   Procedure: PERIPHERAL VASCULAR BALLOON ANGIOPLASTY;  Surgeon: Maeola Harman, MD;  Location: [facility] INVASIVE CV LAB;  Service: Cardiovascular;  Laterality: Left;  tibial bypass  . PR VEIN BYPASS GRAFT,AORTO-FEM-POP     Left above knee popliteal to posterior tibial artery bypass graft       Family History  Problem Relation Age of Onset  . Heart disease Mother   . Hypertension Mother   . Heart attack Mother   . Heart attack Father   . Heart disease Father        Heart disease before age 13  . Cancer Sister   . Deep vein thrombosis Sister   . Diabetes Sister        Amputation-right leg  . Cancer Daughter   . Diabetes Daughter   . Cancer Brother   . Diabetes Other   . Prostate cancer Other     Social History   Tobacco Use  . Smoking status: Former Smoker    Types: Cigars    Quit date: 05/27/1988    Years since quitting: 31.9  . Smokeless tobacco: Former Neurosurgeon    Types: Chew    Quit date: 05/27/1988  Vaping Use  . Vaping Use: Never used  Substance Use Topics  . Alcohol use: No    Alcohol/week: 0.0 standard drinks  . Drug use: No    Home Medications Prior to Admission medications   Medication Sig Start Date End Date Taking? Authorizing Provider  apixaban (ELIQUIS) 2.5 MG TABS tablet Take 1 tablet (2.5 mg total) by mouth 2 (two) times daily. 03/27/20   Dyann Kief, PA-C  aspirin EC 81 MG tablet Take 81 mg by mouth daily. Swallow whole.    [provider]  atorvastatin (LIPITOR) 20 MG tablet Take 1 tablet (20 mg total) by mouth daily at 6 PM. 03/07/20   Anabel Halon, MD  furosemide (LASIX) 40 MG tablet Take 1 tablet (40 mg total) by mouth daily. 02/22/20 02/21/21  Burnadette Pop, MD  lubiprostone (AMITIZA) 24 MCG capsule Take 1 capsule (24 mcg total) by mouth 2 (two) times daily with a meal. 03/07/20   Anabel Halon, MD  metFORMIN (GLUCOPHAGE) 500 MG tablet Take 1 tablet (500 mg total) by mouth 2 (two) times daily with a  meal. 03/09/20   Anabel Halon, MD  metoprolol tartrate (LOPRESSOR) 50 MG tablet Take 50 mg by mouth 2 (two) times daily.    [provider]  pantoprazole (PROTONIX) 40 MG tablet Take 1 tablet (40 mg total) by mouth daily. 04/28/20   Bethann Berkshire, MD  potassium chloride 20 MEQ TBCR Take 20 mEq by mouth daily. 02/22/20   Burnadette Pop, MD  senna (SENOKOT) 8.6 MG TABS tablet Take 1 tablet (8.6 mg total) by mouth daily as needed for mild constipation. Patient not taking: Reported on 04/28/2020 03/07/20   Anabel Halon, MD    Allergies    Patient has no known allergies.  Review of Systems   Review of Systems  Constitutional: Positive for appetite change and fatigue. Negative for fever.  HENT:  Negative for congestion.   Respiratory: Negative for shortness of breath.   Cardiovascular: Positive for leg swelling. Negative for chest pain.  Gastrointestinal: Positive for abdominal pain.  Genitourinary: Negative for flank pain.  Musculoskeletal: Negative for back pain.  Skin: Negative for rash.  Neurological: Positive for weakness.  Psychiatric/Behavioral: Negative for behavioral problems.    Physical Exam Updated Vital Signs BP 96/73   Pulse (!) 106   Temp 97.6 F (36.4 C) (Rectal)   Resp (!) 25   SpO2 100%   Physical Exam Vitals and nursing note reviewed.  HENT:     Head: Normocephalic.  Cardiovascular:     Rate and Rhythm: Rhythm irregular.  Pulmonary:     Breath sounds: No wheezing or rhonchi.  Abdominal:     General: There are no signs of injury.     Tenderness: There is abdominal tenderness.     Hernia: No hernia is present.     Comments: Mild diffuse abdominal tenderness.  No rebound or guarding.  No mass palpated.  Does have some anasarca in abdomen and down right leg.  Skin:    General: Skin is warm.     Capillary Refill: Capillary refill takes less than 2 seconds.  Neurological:     Mental Status: He is alert.     Comments: Awake and appropriate and  answers questions.     ED Results / Procedures / Treatments   Labs (all labs ordered are listed, but only abnormal results are displayed) Labs Reviewed  LACTIC ACID, PLASMA - Abnormal; Notable for the following components:      Result Value   Lactic Acid, Venous 6.5 (*)    All other components within normal limits  COMPREHENSIVE METABOLIC PANEL - Abnormal; Notable for the following components:   Sodium 129 (*)    Potassium 6.6 (*)    CO2 13 (*)    Glucose, Bld 206 (*)    BUN 54 (*)    Creatinine, Ser 2.58 (*)    Calcium 8.8 (*)    AST 672 (*)    ALT 426 (*)    Alkaline Phosphatase 131 (*)    Total Bilirubin 2.3 (*)    GFR, Estimated 23 (*)    All other components within normal limits  CBC WITH DIFFERENTIAL/PLATELET - Abnormal; Notable for the following components:   RBC 3.84 (*)    Hemoglobin 11.7 (*)    HCT 37.1 (*)    All other components within normal limits  RESP PANEL BY RT-PCR (FLU A&B, COVID) ARPGX2  CULTURE, BLOOD (ROUTINE X 2)  CULTURE, BLOOD (ROUTINE X 2)  LACTIC ACID, PLASMA    EKG EKG Interpretation  Date/Time:  Friday April 28 2020 21:20:29 EST Ventricular Rate:  107 PR Interval:    QRS Duration: 114 QT Interval:  383 QTC Calculation: 511 R Axis:   -34 Text Interpretation: Atrial fibrillation Incomplete right bundle branch block Repol abnrm suggests ischemia, lateral leads Prolonged QT interval No significant change since last tracing Confirmed by Benjiman Core [phone]) on 04/28/2020 10:02:07 PM   Radiology CT ABDOMEN PELVIS W CONTRAST  Result Date: 04/28/2020 CLINICAL DATA:  Mid abdominal pain and distension, previous cholecystectomy and appendectomy EXAM: CT ABDOMEN AND PELVIS WITH CONTRAST TECHNIQUE: Multidetector CT imaging of the abdomen and pelvis was performed using the standard protocol following bolus administration of intravenous contrast. CONTRAST:  75mL OMNIPAQUE IOHEXOL 300 MG/ML  SOLN COMPARISON:  02/20/2020 FINDINGS: Lower chest:  Slight increase in the small bilateral pleural effusions with  associated bibasilar compressive atelectasis. Heart is enlarged. No pericardial effusion. Distal thoracic aorta atherosclerotic. Degenerative changes of the lower thoracic spine. Hepatobiliary: Reflux of contrast into the IVC and hepatic veins suggesting right heart failure. No focal hepatic abnormality or intrahepatic biliary dilatation. Remote cholecystectomy. Pancreas: Unremarkable. No pancreatic ductal dilatation or surrounding inflammatory changes. Spleen: Normal in size without focal abnormality. Adrenals/Urinary Tract: Normal adrenal glands. Bilateral renal cysts again noted. No renal obstruction or hydronephrosis. No hydroureter or ureteral calculus. Bladder is collapsed accounting for wall prominence. Stomach/Bowel: Similar wall thickening of the duodenum within adjacent duodenal air-fluid level which may represent a diverticulum. Surrounding right upper quadrant strandy edema. Findings remain consistent with duodenitis. Difficult to exclude peptic ulcer disease. No free air. Similar trace strandy edema along the right pericolic gutter and free fluid. Small amount of dependent pelvic free fluid as before. Colon remains moderately air distended but the small bowel is decompressed. This remains nonspecific. Chronic colonic ileus/dysmotility could have this appearance. No focal fluid collection or abscess.  No hemorrhage or hematoma. Vascular/Lymphatic: Aorta atherosclerotic. Negative for aneurysm. Mesenteric and renal vasculature appear to remain patent. No veno-occlusive process. No bulky adenopathy. Reproductive: Marked prostate enlargement. Other: Small fat containing inguinal hernias bilaterally. Intact abdominal wall. No ventral hernia. Musculoskeletal: Bones are osteopenic. Degenerative changes again noted of the spine. No acute compression fracture. IMPRESSION: Similar circumferential duodenal wall thickening in the right upper quadrant with  strandy edema and small amount of free fluid along the right pericolic gutter into the pelvis. Again, duodenitis and/or peptic ulcer disease could have this appearance. Adjacent duodenal air-fluid level may represent a duodenal diverticulum. Slight increase in the small pleural effusions and bibasilar compressive atelectasis Cardiomegaly with CT evidence of right heart failure. Nonspecific marked colonic distension without obstruction. See above comment. Aortic Atherosclerosis (ICD10-I70.0). Electronically Signed   By: Judie Petit.  Shick M.D.   On: 04/28/2020 09:36   DG Abdomen Acute W/Chest  Result Date: 04/28/2020 CLINICAL DATA:  Abdominal pain EXAM: DG ABDOMEN ACUTE WITH 1 VIEW CHEST COMPARISON:  04/28/2020 FINDINGS: Supine and upright frontal views of the abdomen as well as an upright frontal view of the chest are obtained. Cardiac silhouette is stable. No airspace disease. Small bilateral effusions. No pneumothorax. No bowel obstruction or ileus. Continued gaseous distention of the colon. Excreted contrast within the urinary bladder from previous CT. No free gas in the greater peritoneal sac. No abdominal masses or abnormal calcifications. IMPRESSION: 1. No evidence of bowel obstruction or ileus. 2. Stable gaseous distension of the sigmoid colon. 3. Small bilateral pleural effusions. Electronically Signed   By: Sharlet Salina M.D.   On: 04/28/2020 22:42   DG ABD ACUTE 2+V W 1V CHEST  Result Date: 04/28/2020 CLINICAL DATA:  Abdominal pain EXAM: DG ABDOMEN ACUTE WITH 1 VIEW CHEST COMPARISON:  CT 02/20/2020 FINDINGS: Lungs are clear. No pneumothorax or pleural effusion. Mild cardiomegaly is present. Pulmonary vascularity is normal. Normal abdominal gas pattern with mild gaseous distension of a redundant sigmoid colon, similar to that noted on prior CT examination. No free intraperitoneal gas. Underpenetration precludes evaluation of the visceral shadows. Cholecystectomy clips seen in the right upper quadrant.  Phleboliths noted within the pelvis. IMPRESSION: Negative abdominal radiographs.  No acute cardiopulmonary disease. Electronically Signed   By: Helyn Numbers MD   On: 04/28/2020 06:23    Procedures Procedures (including critical care time)  Medications Ordered in ED Medications  lactated ringers bolus 1,000 mL (has no administration in time range)  sodium zirconium cyclosilicate (  LOKELMA) packet 10 g (has no administration in time range)  sodium bicarbonate injection 50 mEq (has no administration in time range)  sodium chloride 0.9 % bolus 500 mL (0 mLs Intravenous Stopped 04/28/20 2346)    ED Course  I have reviewed the triage vital signs and the nursing notes.  Pertinent labs & imaging results that were available during my care of the patient were reviewed by me and considered in my medical decision making (see chart for details).    MDM Rules/Calculators/A&P                          Patient presents with abdominal pain.  Continuing since being seen earlier in the day.  States decreased oral intake.  Really not eating and drinking seem at home.  Has had some diarrhea also.  Reviewing labs from earlier today had a mildly increased creatinine.  Pain is dull and diffuse.  States he also has some neck pain and a dull headache now.  No fevers.  No trauma.  He is on anticoagulation for atrial fibrillation and is currently in A. fib.  Acute abdominal series done along with reviewing CT from earlier today.  Acute abdominal series is stable.  No free air.  Patient is awake and appropriate.  Fluid bolus filling.  Repeating lab work but will require admission the hospital.  Patient does not appear septic.  Patient has not had his Covid vaccine.  Per patient's granddaughter he is opposed to them.  Patient has worsening kidney function.  Decreased bicarb and increased potassium.  Will give fluid bolus and give bicarb bolus.  Also give some Lokelma.  Blood pressures improved.  Patient not febrile.   Sepsis felt less likely.  Discussed with Dr. Robb Matar.  Thinks that due to elevated lactic acid and multiorgan system failure patient needs admission to hospital to the ICU at [facility].  Will discuss with intensivist.  CRITICAL CARE Performed by: Benjiman Core Total critical care time: 30 minutes Critical care time was exclusive of separately billable procedures and treating other patients. Critical care was necessary to treat or prevent imminent or life-threatening deterioration. Critical care was time spent personally by me on the following activities: development of treatment plan with patient and/or surrogate as well as nursing, discussions with consultants, evaluation of patient's response to treatment, examination of patient, obtaining history from patient or surrogate, ordering and performing treatments and interventions, ordering and review of laboratory studies, ordering and review of radiographic studies, pulse oximetry and re-evaluation of patient's condition.  Discussed with Dr.Meier from critical care.  Patient is likely volume resuscitated at this point and has signs of heart failure.  Will start low-dose dopamine and give some Lasix.  This also should help with the hyperkalemia.  Accepted in transfer to [facility] but no available beds at this time.  Final Clinical Impression(s) / ED Diagnoses Final diagnoses:  Generalized abdominal pain  Duodenitis  Hypotension, unspecified hypotension type  AKI (acute kidney injury) (HCC)  Hyperkalemia    Rx / DC Orders ED Discharge Orders    None       Benjiman Core, MD 04/29/20 0005    Benjiman Core, MD 04/29/20 [facility]    Benjiman Core, MD 04/29/20 [facility]    Benjiman Core, MD 04/29/20 [phone]

## 2020-04-28 NOTE — ED Triage Notes (Signed)
Pt with continued abd pain, was seen last night for same.  Also c/o neck pain.

## 2020-04-28 NOTE — Discharge Instructions (Addendum)
Follow up with dr. Jena Gauss in 1-2 weeks.  Return if problems. Take Tylenol for pain and do not take any Motrin type medicine

## 2020-04-29 DIAGNOSIS — R52 Pain, unspecified: Secondary | ICD-10-CM | POA: Diagnosis not present

## 2020-04-29 DIAGNOSIS — R570 Cardiogenic shock: Secondary | ICD-10-CM | POA: Diagnosis present

## 2020-04-29 DIAGNOSIS — N179 Acute kidney failure, unspecified: Secondary | ICD-10-CM | POA: Diagnosis present

## 2020-04-29 DIAGNOSIS — E875 Hyperkalemia: Secondary | ICD-10-CM | POA: Diagnosis present

## 2020-04-29 DIAGNOSIS — N1832 Chronic kidney disease, stage 3b: Secondary | ICD-10-CM | POA: Diagnosis present

## 2020-04-29 DIAGNOSIS — D509 Iron deficiency anemia, unspecified: Secondary | ICD-10-CM | POA: Diagnosis present

## 2020-04-29 DIAGNOSIS — I959 Hypotension, unspecified: Secondary | ICD-10-CM | POA: Diagnosis present

## 2020-04-29 DIAGNOSIS — Z66 Do not resuscitate: Secondary | ICD-10-CM | POA: Diagnosis not present

## 2020-04-29 DIAGNOSIS — Z7189 Other specified counseling: Secondary | ICD-10-CM | POA: Diagnosis not present

## 2020-04-29 DIAGNOSIS — E1151 Type 2 diabetes mellitus with diabetic peripheral angiopathy without gangrene: Secondary | ICD-10-CM | POA: Diagnosis present

## 2020-04-29 DIAGNOSIS — E860 Dehydration: Secondary | ICD-10-CM | POA: Diagnosis present

## 2020-04-29 DIAGNOSIS — R0602 Shortness of breath: Secondary | ICD-10-CM | POA: Diagnosis not present

## 2020-04-29 DIAGNOSIS — Z20822 Contact with and (suspected) exposure to covid-19: Secondary | ICD-10-CM | POA: Diagnosis present

## 2020-04-29 DIAGNOSIS — I4891 Unspecified atrial fibrillation: Secondary | ICD-10-CM | POA: Diagnosis not present

## 2020-04-29 DIAGNOSIS — Z87891 Personal history of nicotine dependence: Secondary | ICD-10-CM | POA: Diagnosis not present

## 2020-04-29 DIAGNOSIS — E785 Hyperlipidemia, unspecified: Secondary | ICD-10-CM | POA: Diagnosis present

## 2020-04-29 DIAGNOSIS — I340 Nonrheumatic mitral (valve) insufficiency: Secondary | ICD-10-CM | POA: Diagnosis not present

## 2020-04-29 DIAGNOSIS — K7200 Acute and subacute hepatic failure without coma: Secondary | ICD-10-CM | POA: Diagnosis present

## 2020-04-29 DIAGNOSIS — I4819 Other persistent atrial fibrillation: Secondary | ICD-10-CM | POA: Diagnosis present

## 2020-04-29 DIAGNOSIS — I10 Essential (primary) hypertension: Secondary | ICD-10-CM | POA: Diagnosis not present

## 2020-04-29 DIAGNOSIS — K219 Gastro-esophageal reflux disease without esophagitis: Secondary | ICD-10-CM | POA: Diagnosis not present

## 2020-04-29 DIAGNOSIS — I130 Hypertensive heart and chronic kidney disease with heart failure and stage 1 through stage 4 chronic kidney disease, or unspecified chronic kidney disease: Secondary | ICD-10-CM | POA: Diagnosis present

## 2020-04-29 DIAGNOSIS — Z8711 Personal history of peptic ulcer disease: Secondary | ICD-10-CM | POA: Diagnosis not present

## 2020-04-29 DIAGNOSIS — Z79899 Other long term (current) drug therapy: Secondary | ICD-10-CM | POA: Diagnosis not present

## 2020-04-29 DIAGNOSIS — I5023 Acute on chronic systolic (congestive) heart failure: Secondary | ICD-10-CM | POA: Diagnosis not present

## 2020-04-29 DIAGNOSIS — I5043 Acute on chronic combined systolic (congestive) and diastolic (congestive) heart failure: Secondary | ICD-10-CM | POA: Diagnosis present

## 2020-04-29 DIAGNOSIS — Z515 Encounter for palliative care: Secondary | ICD-10-CM | POA: Diagnosis not present

## 2020-04-29 DIAGNOSIS — E872 Acidosis: Secondary | ICD-10-CM | POA: Diagnosis present

## 2020-04-29 DIAGNOSIS — E1165 Type 2 diabetes mellitus with hyperglycemia: Secondary | ICD-10-CM | POA: Diagnosis present

## 2020-04-29 DIAGNOSIS — I361 Nonrheumatic tricuspid (valve) insufficiency: Secondary | ICD-10-CM | POA: Diagnosis not present

## 2020-04-29 DIAGNOSIS — R531 Weakness: Secondary | ICD-10-CM | POA: Diagnosis not present

## 2020-04-29 DIAGNOSIS — I509 Heart failure, unspecified: Secondary | ICD-10-CM | POA: Diagnosis not present

## 2020-04-29 DIAGNOSIS — Z7901 Long term (current) use of anticoagulants: Secondary | ICD-10-CM | POA: Diagnosis not present

## 2020-04-29 DIAGNOSIS — R7989 Other specified abnormal findings of blood chemistry: Secondary | ICD-10-CM | POA: Diagnosis not present

## 2020-04-29 DIAGNOSIS — I214 Non-ST elevation (NSTEMI) myocardial infarction: Secondary | ICD-10-CM | POA: Diagnosis present

## 2020-04-29 DIAGNOSIS — I429 Cardiomyopathy, unspecified: Secondary | ICD-10-CM | POA: Diagnosis present

## 2020-04-29 DIAGNOSIS — E1122 Type 2 diabetes mellitus with diabetic chronic kidney disease: Secondary | ICD-10-CM | POA: Diagnosis present

## 2020-04-29 DIAGNOSIS — K5900 Constipation, unspecified: Secondary | ICD-10-CM | POA: Diagnosis not present

## 2020-04-29 LAB — BASIC METABOLIC PANEL
Anion gap: 15 (ref 5–15)
BUN: 57 mg/dL — ABNORMAL HIGH (ref 8–23)
CO2: 14 mmol/L — ABNORMAL LOW (ref 22–32)
Calcium: 8.8 mg/dL — ABNORMAL LOW (ref 8.9–10.3)
Chloride: 104 mmol/L (ref 98–111)
Creatinine, Ser: 2.56 mg/dL — ABNORMAL HIGH (ref 0.61–1.24)
GFR, Estimated: 23 mL/min — ABNORMAL LOW (ref >60)
Glucose, Bld: 155 mg/dL — ABNORMAL HIGH (ref 70–99)
Potassium: 5.3 mmol/L — ABNORMAL HIGH (ref 3.5–5.1)
Sodium: 133 mmol/L — ABNORMAL LOW (ref 135–145)

## 2020-04-29 LAB — TROPONIN I (HIGH SENSITIVITY)
Troponin I (High Sensitivity): 939 ng/L (ref <18)
Troponin I (High Sensitivity): 838 ng/L (ref <18)

## 2020-04-29 LAB — LACTIC ACID, PLASMA
Lactic Acid, Venous: 7.9 mmol/L (ref 0.5–1.9)
Lactic Acid, Venous: 5 mmol/L (ref 0.5–1.9)

## 2020-04-29 LAB — BLOOD GAS, ARTERIAL
Acid-base deficit: 8.9 mmol/L — ABNORMAL HIGH (ref 0.0–2.0)
Bicarbonate: 17.8 mmol/L — ABNORMAL LOW (ref 20.0–28.0)
FIO2: 21
O2 Saturation: 96 %
Patient temperature: 37
pCO2 arterial: 24.4 mmHg — ABNORMAL LOW (ref 32.0–48.0)
pH, Arterial: 7.403 (ref 7.350–7.450)
pO2, Arterial: 89.2 mmHg (ref 83.0–108.0)

## 2020-04-29 MED ORDER — SODIUM ZIRCONIUM CYCLOSILICATE 5 G PO PACK
10.0000 g | PACK | Freq: Once | ORAL | Status: AC
  Administered 2020-04-29: 10 g via ORAL
  Filled 2020-04-29: qty 2

## 2020-04-29 MED ORDER — LACTATED RINGERS IV BOLUS
1000.0000 mL | Freq: Once | INTRAVENOUS | Status: AC
  Administered 2020-04-29: 1000 mL via INTRAVENOUS

## 2020-04-29 MED ORDER — DOBUTAMINE IN D5W 4-5 MG/ML-% IV SOLN
2.5000 ug/kg/min | INTRAVENOUS | Status: DC
  Administered 2020-04-29: 2.5 ug/kg/min via INTRAVENOUS
  Filled 2020-04-29: qty 250

## 2020-04-29 MED ORDER — SODIUM BICARBONATE 8.4 % IV SOLN
50.0000 meq | Freq: Once | INTRAVENOUS | Status: AC
  Administered 2020-04-29: 50 meq via INTRAVENOUS
  Filled 2020-04-29: qty 50

## 2020-04-29 MED ORDER — FUROSEMIDE 10 MG/ML IJ SOLN
40.0000 mg | Freq: Once | INTRAMUSCULAR | Status: AC
  Administered 2020-04-29: 40 mg via INTRAVENOUS
  Filled 2020-04-29: qty 4

## 2020-04-29 NOTE — ED Notes (Signed)
Date and time results received: 04/29/20 0136   Test: LACTIC Critical Value: 7.9  Name of Provider Notified: Thora Lance, MD

## 2020-04-29 NOTE — ED Provider Notes (Signed)
At change of shift - pt without complaint Has ongoing tachy in afib - close to rate controlled On dobutamine, feeling better Lactic improved, K improved, D/w ICU physician - Dr. Tonia Brooms - continues to recommends transfer.  Trop elevated, likely part of the perfusion process Will repeat    Eber Hong, MD 04/29/20 [phone]

## 2020-04-29 NOTE — ED Notes (Signed)
CRITICAL VALUE ALERT  Critical Value:  Lactic Acid 5.9, Troponin 939  Date & Time Notied:  04/29/20 0820  Provider Notified: Dr. Hyacinth Meeker  Orders Received/Actions taken: EDP notified

## 2020-04-29 NOTE — ED Provider Notes (Signed)
Patient left at change of shift waiting for a bed to become available in the ICU at one of the [facility].  He had presented to the ED on December 3 with abdominal pain that showed possible duodenitis.  He was discharged home after oral fluid challenge.  However after he got home he was unable to eat or drink anything.  He complained of diffuse abdominal pain.  He was hypotensive when he represented to the ED last night and responded to some IV fluids but he also was noted to have a lot of peripheral edema.  He has been started on dobutamine for blood pressure support.  He also was hyperkalemic and he has been treated with Lokelma and 1 amp of bicarb.  He was also given Lasix for his peripheral edema.  1:40 AM blood pressure was 100/74, heart rate 97  3:00 AM patient is on dobutamine 2.5 mcg/kg/min.  His blood pressure is 98/64 heart rate 96 pulse ox 97%.  Patient is resting with mild tachypnea.  Recheck at 6:40 AM patient remains on dobutamine at 2.5 mcg/kg/min.  He is awake this time, he denies having any abdominal pain.  When I first went in the room his blood pressure was 105/75 and that it was repeated and was 118/103.  Heart rate is little bit faster at 98.  His pulse ox is 95%.  Patient has 200 cc of urine output.  I explained to him that we were waiting to transfer him to [facility] to one of the hospitals there because he needed critical care that he could not get here.  Pt turned over to Dr Hyacinth Meeker at change of shift.     Devoria Albe, MD 04/29/20 [phone]

## 2020-04-29 NOTE — ED Notes (Signed)
Spoke with Mora Bellman at [facility] will page Critical Care for Dr. Hyacinth Meeker.

## 2020-04-29 NOTE — ED Notes (Signed)
Date and time results received: 04/29/20 0755  Test: Lactic Acid Critical Value: 5.0  Name of Provider Notified: Hyacinth Meeker  Orders Received? Or Actions Taken?: awaiting orders

## 2020-04-29 NOTE — H&P (Incomplete)
NAME:  Warren Mcdonald, MRN:  [id number], DOB:  Aug 29, 1929, LOS: 0 ADMISSION DATE:  04/28/2020, CONSULTATION DATE:  04/29/20 REFERRING MD:  Rubin Payor, CHIEF COMPLAINT:  Abdominal pain  Brief History   [name] with history of [name], AF with suspected decompensated right and left heart failure and AKI  History of present illness   [name] with history of [name], AF, PAD, PUD who presented to ED this evening with abdominal pain. He was seen in ED last night for abdominal pain and bloating for 2 days. CT A/P at the time remarkable for bilateral pleural effusions, reflux of contrast into IVC, duodenal wall thickening with surrounding 'strandy edema.' He was given IV fluids and discharged. He represented to ED this evening with abdominal pain, anorexia.  In the ED he was given 1.5L crystalloids, 1 amp bicarb, 10g lokelma for hyperkalemia.   Past Medical History  [name] AF PAD PUD DM2   Significant Hospital Events   12/4 admitted  Consults:  None  Procedures:  None  Significant Diagnostic Tests:  CT A/P 04/28/20: remarkable for bilateral pleural effusions, reflux of contrast into IVC, duodenal wall thickening with surrounding 'strandy edema.'  Micro Data:  None  Antimicrobials:  None  Interim history/subjective:  n/a  Objective   Blood pressure 96/71, pulse (!) 108, temperature 97.6 F (36.4 C), temperature source Rectal, resp. rate (!) 24, SpO2 100 %.        Intake/Output Summary (Last 24 hours) at 04/29/2020 0049 Last data filed at 04/28/2020 2346 Gross per 24 hour  Intake 500 ml  Output -  Net 500 ml   There were no vitals filed for this visit.  Examination: General: alert/oriented x3 HENT: NCAT, dry MM Lungs: CTAB, normal work of breathing Cardiovascular: IRIR, no murmur, +HJR Abdomen: soft, distended but soft, normal bowel sounds Extremities: lukewarm, 2+ edema Neuro: grossly nonfocal, follows commands  Resolved Hospital Problem list   n/a  Assessment & Plan:    # ***Decompensated right and left heart failure: - trial of fixed dose dobutamine, diuresis - TTE  # AKI on CKD: - inotrope/diuretic challenge as above  # Hyperkalemia - lokelma - assess response to diuretic  # Possible duodenitis: findings may alternatively be related to volume overload. - ppi  # Transaminitis: suspect congestive hepatopathy - trend LFTs - RUQ [place]  # Lactic acidosis: - trend  # Atrial fibrillation: - hold home metoprolol in setting suspected decompensated heart failure  - hold eliquis for now in event he requires procedure  Best practice:  Diet: 2g sodium Pain/Anxiety/Delirium protocol (if indicated): no VAP protocol (if indicated): no DVT prophylaxis: sq heparin GI prophylaxis: home ppi Glucose control: ssi Mobility: *** Code Status: Full Family Communication: *** Disposition: ICU  Labs   CBC: Recent Labs  Lab 04/28/20 0441 04/28/20 2303  WBC 6.1 8.2  NEUTROABS 4.5 6.1  HGB 12.0* 11.7*  HCT 38.0* 37.1*  MCV 95.0 96.6  PLT 232 204    Basic Metabolic Panel: Recent Labs  Lab 04/28/20 0441 04/28/20 2303  NA 132* 129*  K 5.2* 6.6*  CL 100 102  CO2 19* 13*  GLUCOSE 185* 206*  BUN 40* 54*  CREATININE 1.78* 2.58*  CALCIUM 9.5 8.8*   GFR: Estimated Creatinine Clearance: 24.3 mL/min (A) (by C-G formula based on SCr of 2.58 mg/dL (H)). Recent Labs  Lab 04/28/20 0441 04/28/20 2303  WBC 6.1 8.2  LATICACIDVEN  --  6.5*    Liver Function Tests: Recent Labs  Lab 04/28/20 0441 04/28/20  2303  AST 55* 672*  ALT 36 426*  ALKPHOS 93 131*  BILITOT 2.0* 2.3*  PROT 8.0 7.9  ALBUMIN 4.1 3.9   Recent Labs  Lab 04/28/20 0441  LIPASE 26   No results for input(s): AMMONIA in the last 168 hours.  ABG    Component Value Date/Time   TCO2 27 05/24/2019 0902     Coagulation Profile: No results for input(s): INR, PROTIME in the last 168 hours.  Cardiac Enzymes: No results for input(s): CKTOTAL, CKMB, CKMBINDEX, TROPONINI in the  last 168 hours.  HbA1C: Hemoglobin A1C  Date/Time Value Ref Range Status  11/03/2017 12:00 AM 7.8  Final   Hgb A1c MFr Bld  Date/Time Value Ref Range Status  02/20/2020 04:13 PM 7.4 (H) 4.8 - 5.6 % Final    Comment:    (NOTE) Pre diabetes:          5.7%-6.4%  Diabetes:              >6.4%  Glycemic control for   <7.0% adults with diabetes   06/25/2019 08:05 PM 7.8 (H) 4.8 - 5.6 % Final    Comment:    (NOTE) Pre diabetes:          5.7%-6.4% Diabetes:              >6.4% Glycemic control for   <7.0% adults with diabetes     CBG: No results for input(s): GLUCAP in the last 168 hours.  Review of Systems:   A twelve point review of systems is negative except as otherwise noted in HPI, unable to obtain***  Past Medical History  He,  has a past medical history of Abdominal distension (gaseous), Anemia, unspecified, Chronic diastolic (congestive) heart failure (HCC), Essential (primary) hypertension, Hyperlipidemia, PAF (paroxysmal atrial fibrillation) (HCC), Peripheral vascular disease (HCC) (2011), PUD (peptic ulcer disease), and Type 2 diabetes mellitus with diabetic peripheral angiopathy without gangrene (HCC).   Surgical History    Past Surgical History:  Procedure Laterality Date  . ABDOMINAL AORTOGRAM N/A 05/24/2019   Procedure: ABDOMINAL AORTOGRAM;  Surgeon: Maeola Harman, MD;  Location: [facility] INVASIVE CV LAB;  Service: Cardiovascular;  Laterality: N/A;  . AMPUTATION Left 06/16/2019   Procedure: AMPUTATION DIGIT FOURTH TOE LEFT FOOT;  Surgeon: Nada Libman, MD;  Location: [facility] OR;  Service: Vascular;  Laterality: Left;  . AMPUTATION Left 06/26/2019   Procedure: AMPUTATION two DIGITs, left toe;  Surgeon: Maeola Harman, MD;  Location: [facility] OR;  Service: Vascular;  Laterality: Left;  . AMPUTATION Left 07/02/2019   Procedure: left AMPUTATION ABOVE KNEE;  Surgeon: Larina Earthly, MD;  Location: [facility] OR;  Service: Vascular;  Laterality: Left;  . APPENDECTOMY     . CATARACT EXTRACTION W/ INTRAOCULAR LENS  IMPLANT, BILATERAL    . CHOLECYSTECTOMY N/A 09/08/2014   Procedure: CHOLECYSTECTOMY;  Surgeon: Franky Macho Md, MD;  Location: [facility] ORS;  Service: General;  Laterality: N/A;  . EYE SURGERY    . I & D EXTREMITY Left 06/30/2019   Procedure: sharp incisonal DEBRIDEMENT of left foot including bone, tendon, muscle, and skin;  Surgeon: Cephus Shelling, MD;  Location[facility] OR;  Service: Vascular;  Laterality: Left;  . LOWER EXTREMITY ANGIOGRAPHY Left 05/24/2019   Procedure: Lower Extremity Angiography;  Surgeon: Maeola Harman, MD;  Location: [facility] INVASIVE CV LAB;  Service: Cardiovascular;  Laterality: Left;  . PERIPHERAL VASCULAR BALLOON ANGIOPLASTY Left 05/24/2019   Procedure: PERIPHERAL VASCULAR BALLOON ANGIOPLASTY;  Surgeon: Maeola Harman, MD;  Location: [facility] INVASIVE CV LAB;  Service: Cardiovascular;  Laterality: Left;  tibial bypass  . PR VEIN BYPASS GRAFT,AORTO-FEM-POP     Left above knee popliteal to posterior tibial artery bypass graft     Social History   reports that he quit smoking about 31 years ago. His smoking use included cigars. He quit smokeless tobacco use about 31 years ago.  His smokeless tobacco use included chew. He reports that he does not drink alcohol and does not use drugs.   Family History   His family history includes Cancer in his brother, daughter, and sister; Deep vein thrombosis in his sister; Diabetes in his daughter, sister, and another family member; Heart attack in his father and mother; Heart disease in his father and mother; Hypertension in his mother; Prostate cancer in an other family member.   Allergies No Known Allergies   Home Medications  Prior to Admission medications   Medication Sig Start Date End Date Taking? Authorizing Provider  apixaban (ELIQUIS) 2.5 MG TABS tablet Take 1 tablet (2.5 mg total) by mouth 2 (two) times daily. 03/27/20   Dyann Kief, PA-C  aspirin EC 81 MG tablet Take  81 mg by mouth daily. Swallow whole.    [provider]  atorvastatin (LIPITOR) 20 MG tablet Take 1 tablet (20 mg total) by mouth daily at 6 PM. 03/07/20   Anabel Halon, MD  furosemide (LASIX) 40 MG tablet Take 1 tablet (40 mg total) by mouth daily. 02/22/20 02/21/21  Burnadette Pop, MD  lubiprostone (AMITIZA) 24 MCG capsule Take 1 capsule (24 mcg total) by mouth 2 (two) times daily with a meal. 03/07/20   Anabel Halon, MD  metFORMIN (GLUCOPHAGE) 500 MG tablet Take 1 tablet (500 mg total) by mouth 2 (two) times daily with a meal. 03/09/20   Anabel Halon, MD  metoprolol tartrate (LOPRESSOR) 50 MG tablet Take 50 mg by mouth 2 (two) times daily.    [provider]  pantoprazole (PROTONIX) 40 MG tablet Take 1 tablet (40 mg total) by mouth daily. 04/28/20   Bethann Berkshire, MD  potassium chloride 20 MEQ TBCR Take 20 mEq by mouth daily. 02/22/20   Burnadette Pop, MD  senna (SENOKOT) 8.6 MG TABS tablet Take 1 tablet (8.6 mg total) by mouth daily as needed for mild constipation. Patient not taking: Reported on 04/28/2020 03/07/20   Anabel Halon, MD     Critical care time: ***    This patient is critically ill with multiple organ system failure; which, requires frequent high complexity decision making, assessment, support, evaluation, and titration of therapies. This was completed through the application of advanced monitoring technologies and extensive interpretation of multiple databases. During this encounter critical care time was devoted to patient care services described in this note for *** minutes.

## 2020-04-30 ENCOUNTER — Inpatient Hospital Stay (HOSPITAL_COMMUNITY): Payer: Medicare Other

## 2020-04-30 ENCOUNTER — Encounter (HOSPITAL_COMMUNITY): Payer: Self-pay | Admitting: Student

## 2020-04-30 DIAGNOSIS — I4891 Unspecified atrial fibrillation: Secondary | ICD-10-CM | POA: Diagnosis present

## 2020-04-30 DIAGNOSIS — I9589 Other hypotension: Secondary | ICD-10-CM

## 2020-04-30 DIAGNOSIS — E861 Hypovolemia: Secondary | ICD-10-CM

## 2020-04-30 LAB — COMPREHENSIVE METABOLIC PANEL
ALT: 660 U/L — ABNORMAL HIGH (ref 0–44)
AST: 664 U/L — ABNORMAL HIGH (ref 15–41)
Albumin: 3.8 g/dL (ref 3.5–5.0)
Alkaline Phosphatase: 113 U/L (ref 38–126)
Anion gap: 13 (ref 5–15)
BUN: 56 mg/dL — ABNORMAL HIGH (ref 8–23)
CO2: 17 mmol/L — ABNORMAL LOW (ref 22–32)
Calcium: 8.8 mg/dL — ABNORMAL LOW (ref 8.9–10.3)
Chloride: 104 mmol/L (ref 98–111)
Creatinine, Ser: 2.21 mg/dL — ABNORMAL HIGH (ref 0.61–1.24)
GFR, Estimated: 28 mL/min — ABNORMAL LOW (ref >60)
Glucose, Bld: 213 mg/dL — ABNORMAL HIGH (ref 70–99)
Potassium: 3.7 mmol/L (ref 3.5–5.1)
Sodium: 134 mmol/L — ABNORMAL LOW (ref 135–145)
Total Bilirubin: 2 mg/dL — ABNORMAL HIGH (ref 0.3–1.2)
Total Protein: 7.2 g/dL (ref 6.5–8.1)

## 2020-04-30 LAB — RETICULOCYTES
Immature Retic Fract: 27 % — ABNORMAL HIGH (ref 2.3–15.9)
RBC.: 3.64 MIL/uL — ABNORMAL LOW (ref 4.22–5.81)
Retic Count, Absolute: 72.1 10*3/uL (ref 19.0–186.0)
Retic Ct Pct: 2 % (ref 0.4–3.1)

## 2020-04-30 LAB — CBC WITH DIFFERENTIAL/PLATELET
Abs Immature Granulocytes: 0.03 10*3/uL (ref 0.00–0.07)
Basophils Absolute: 0 10*3/uL (ref 0.0–0.1)
Basophils Relative: 1 %
Eosinophils Absolute: 0 10*3/uL (ref 0.0–0.5)
Eosinophils Relative: 0 %
HCT: 33.1 % — ABNORMAL LOW (ref 39.0–52.0)
Hemoglobin: 10.6 g/dL — ABNORMAL LOW (ref 13.0–17.0)
Immature Granulocytes: 1 %
Lymphocytes Relative: 18 %
Lymphs Abs: 1 10*3/uL (ref 0.7–4.0)
MCH: 30.1 pg (ref 26.0–34.0)
MCHC: 32 g/dL (ref 30.0–36.0)
MCV: 94 fL (ref 80.0–100.0)
Monocytes Absolute: 0.5 10*3/uL (ref 0.1–1.0)
Monocytes Relative: 8 %
Neutro Abs: 4.2 10*3/uL (ref 1.7–7.7)
Neutrophils Relative %: 72 %
Platelets: 147 10*3/uL — ABNORMAL LOW (ref 150–400)
RBC: 3.52 MIL/uL — ABNORMAL LOW (ref 4.22–5.81)
RDW: 15.6 % — ABNORMAL HIGH (ref 11.5–15.5)
WBC: 5.7 10*3/uL (ref 4.0–10.5)
nRBC: 0.5 % — ABNORMAL HIGH (ref 0.0–0.2)

## 2020-04-30 LAB — VITAMIN B12: Vitamin B-12: 1646 pg/mL — ABNORMAL HIGH (ref 180–914)

## 2020-04-30 LAB — IRON AND TIBC
Iron: 38 ug/dL — ABNORMAL LOW (ref 45–182)
Saturation Ratios: 13 % — ABNORMAL LOW (ref 17.9–39.5)
TIBC: 290 ug/dL (ref 250–450)
UIBC: 252 ug/dL

## 2020-04-30 LAB — BLOOD GAS, ARTERIAL
Acid-base deficit: 7.2 mmol/L — ABNORMAL HIGH (ref 0.0–2.0)
Bicarbonate: 19 mmol/L — ABNORMAL LOW (ref 20.0–28.0)
FIO2: 21
O2 Saturation: 96 %
Patient temperature: 37
pCO2 arterial: 26.8 mmHg — ABNORMAL LOW (ref 32.0–48.0)
pH, Arterial: 7.408 (ref 7.350–7.450)
pO2, Arterial: 94.6 mmHg (ref 83.0–108.0)

## 2020-04-30 LAB — FOLATE: Folate: 11.6 ng/mL (ref >5.9)

## 2020-04-30 LAB — LACTIC ACID, PLASMA
Lactic Acid, Venous: 2.7 mmol/L (ref 0.5–1.9)
Lactic Acid, Venous: 3 mmol/L (ref 0.5–1.9)
Lactic Acid, Venous: 3.1 mmol/L (ref 0.5–1.9)

## 2020-04-30 LAB — TROPONIN I (HIGH SENSITIVITY)
Troponin I (High Sensitivity): 579 ng/L (ref <18)
Troponin I (High Sensitivity): 913 ng/L (ref <18)

## 2020-04-30 LAB — GLUCOSE, CAPILLARY
Glucose-Capillary: 193 mg/dL — ABNORMAL HIGH (ref 70–99)
Glucose-Capillary: 214 mg/dL — ABNORMAL HIGH (ref 70–99)

## 2020-04-30 LAB — BRAIN NATRIURETIC PEPTIDE: B Natriuretic Peptide: 2416 pg/mL — ABNORMAL HIGH (ref 0.0–100.0)

## 2020-04-30 LAB — FERRITIN: Ferritin: 573 ng/mL — ABNORMAL HIGH (ref 24–336)

## 2020-04-30 MED ORDER — ACETAMINOPHEN 650 MG RE SUPP: 650.0000 mg | Freq: Four times a day (QID) | RECTAL | Status: DC | PRN

## 2020-04-30 MED ORDER — SODIUM CHLORIDE 0.9 % IV BOLUS
500.0000 mL | Freq: Once | INTRAVENOUS | Status: AC
  Administered 2020-04-30: 500 mL via INTRAVENOUS

## 2020-04-30 MED ORDER — AMIODARONE HCL IN DEXTROSE 360-4.14 MG/200ML-% IV SOLN
30.0000 mg/h | INTRAVENOUS | Status: DC
  Administered 2020-04-30 – 2020-05-03 (×5): 30 mg/h via INTRAVENOUS
  Filled 2020-04-30 (×5): qty 200

## 2020-04-30 MED ORDER — AMIODARONE LOAD VIA INFUSION
150.0000 mg | Freq: Once | INTRAVENOUS | Status: AC
  Administered 2020-04-30: 150 mg via INTRAVENOUS
  Filled 2020-04-30: qty 83.34

## 2020-04-30 MED ORDER — ATORVASTATIN CALCIUM 20 MG PO TABS
20.0000 mg | ORAL_TABLET | Freq: Every day | ORAL | Status: DC
  Administered 2020-04-30 – 2020-05-01 (×2): 20 mg via ORAL
  Filled 2020-04-30 (×2): qty 1

## 2020-04-30 MED ORDER — APIXABAN 2.5 MG PO TABS: 2.5000 mg | ORAL_TABLET | Freq: Two times a day (BID) | ORAL | Status: DC

## 2020-04-30 MED ORDER — ONDANSETRON HCL 4 MG/2ML IJ SOLN: 4.0000 mg | Freq: Four times a day (QID) | INTRAMUSCULAR | Status: DC | PRN

## 2020-04-30 MED ORDER — DIGOXIN 0.25 MG/ML IJ SOLN
0.2500 mg | Freq: Once | INTRAMUSCULAR | Status: AC
  Administered 2020-04-30: 0.25 mg via INTRAVENOUS
  Filled 2020-04-30: qty 2

## 2020-04-30 MED ORDER — INSULIN ASPART 100 UNIT/ML ~~LOC~~ SOLN
0.0000 [IU] | Freq: Every day | SUBCUTANEOUS | Status: DC
  Administered 2020-04-30: 2 [IU] via SUBCUTANEOUS

## 2020-04-30 MED ORDER — INSULIN ASPART 100 UNIT/ML ~~LOC~~ SOLN
0.0000 [IU] | Freq: Three times a day (TID) | SUBCUTANEOUS | Status: DC
  Administered 2020-04-30: 2 [IU] via SUBCUTANEOUS
  Administered 2020-05-01: 1 [IU] via SUBCUTANEOUS
  Administered 2020-05-01: 2 [IU] via SUBCUTANEOUS
  Administered 2020-05-01 – 2020-05-03 (×5): 1 [IU] via SUBCUTANEOUS

## 2020-04-30 MED ORDER — DOBUTAMINE IN D5W 4-5 MG/ML-% IV SOLN: 2.5000 ug/kg/min | INTRAVENOUS | Status: DC

## 2020-04-30 MED ORDER — METOPROLOL TARTRATE 5 MG/5ML IV SOLN
5.0000 mg | Freq: Once | INTRAVENOUS | Status: DC
  Filled 2020-04-30: qty 5

## 2020-04-30 MED ORDER — CHLORHEXIDINE GLUCONATE CLOTH 2 % EX PADS
6.0000 | MEDICATED_PAD | Freq: Every day | CUTANEOUS | Status: DC
  Administered 2020-04-30 – 2020-05-04 (×5): 6 via TOPICAL

## 2020-04-30 MED ORDER — APIXABAN 2.5 MG PO TABS
2.5000 mg | ORAL_TABLET | Freq: Two times a day (BID) | ORAL | Status: DC
  Administered 2020-04-30 – 2020-05-01 (×3): 2.5 mg via ORAL
  Filled 2020-04-30 (×3): qty 1

## 2020-04-30 MED ORDER — SODIUM CHLORIDE 0.9 % IV SOLN: INTRAVENOUS | Status: AC

## 2020-04-30 MED ORDER — ONDANSETRON HCL 4 MG PO TABS: 4.0000 mg | ORAL_TABLET | Freq: Four times a day (QID) | ORAL | Status: DC | PRN

## 2020-04-30 MED ORDER — ACETAMINOPHEN 325 MG PO TABS: 650.0000 mg | ORAL_TABLET | Freq: Four times a day (QID) | ORAL | Status: DC | PRN

## 2020-04-30 MED ORDER — AMIODARONE HCL IN DEXTROSE 360-4.14 MG/200ML-% IV SOLN
60.0000 mg/h | INTRAVENOUS | Status: AC
  Administered 2020-04-30: 60 mg/h via INTRAVENOUS
  Filled 2020-04-30 (×2): qty 200

## 2020-04-30 MED ORDER — PANTOPRAZOLE SODIUM 40 MG PO TBEC
40.0000 mg | DELAYED_RELEASE_TABLET | Freq: Every day | ORAL | Status: DC
  Administered 2020-04-30 – 2020-05-04 (×5): 40 mg via ORAL
  Filled 2020-04-30 (×5): qty 1

## 2020-04-30 NOTE — ED Provider Notes (Signed)
[facility] EMERGENCY DEPARTMENT Provider Note   CSN: [id number] Arrival date & time: 04/28/20  2042     History Chief Complaint  Patient presents with  . Abdominal Pain    Warren Mcdonald is a 86 y.o. male.  HPI     Past Medical History:  Diagnosis Date  . Abdominal distension (gaseous)   . Anemia, unspecified   . Chronic diastolic (congestive) heart failure (HCC)   . Essential (primary) hypertension   . Hyperlipidemia   . PAF (paroxysmal atrial fibrillation) (HCC)   . Peripheral vascular disease (HCC) 2011   Left above-knee popliteal to posterior tibial artery bypass   . PUD (peptic ulcer disease)   . Type 2 diabetes mellitus with diabetic peripheral angiopathy without gangrene [employer])     Patient Active Problem List   Diagnosis Date Noted  . Hypotension 04/29/2020  . Encounter to establish care 03/07/2020  . Constipation 03/07/2020  . CKD (chronic kidney disease) stage 3, GFR 30-59 ml/min (HCC) 03/07/2020  . [name] (heart failure with reduced ejection fraction) (HCC) 02/20/2020  . S/P AKA (above knee amputation) (HCC) 08/09/2019  . PAF (paroxysmal atrial fibrillation) (HCC)   . Nonhealing surgical wound 06/25/2019  . Essential (primary) hypertension   . Hyperlipidemia, unspecified   . Type 2 diabetes mellitus with diabetic peripheral angiopathy without gangrene (HCC)   . Chronic combined systolic and diastolic congestive heart failure (HCC)   . Acute diastolic heart failure (HCC) 09/12/2014  . Cholecystitis, acute 09/06/2014  . Atrial fibrillation with rapid ventricular response (HCC) 09/06/2014  . 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 Abdominal pain 09/02/2014  . DM (diabetes mellitus) (HCC) 09/02/2014  . Pain in limb 12/27/2013  . Aftercare following surgery of the circulatory system, NEC 06/28/2013  . Atherosclerosis of native artery of extremity with intermittent claudication (HCC) 06/28/2013  . Peripheral vascular disease (HCC) 12/28/2012  . PAD (peripheral  artery disease) (HCC) 12/23/2011    Past Surgical History:  Procedure Laterality Date  . ABDOMINAL AORTOGRAM N/A 05/24/2019   Procedure: ABDOMINAL AORTOGRAM;  Surgeon: Maeola Harman, MD;  Location: [facility] INVASIVE CV LAB;  Service: Cardiovascular;  Laterality: N/A;  . AMPUTATION Left 06/16/2019   Procedure: AMPUTATION DIGIT FOURTH TOE LEFT FOOT;  Surgeon: Nada Libman, MD;  Location: [facility] OR;  Service: Vascular;  Laterality: Left;  . AMPUTATION Left 06/26/2019   Procedure: AMPUTATION two DIGITs, left toe;  Surgeon: Maeola Harman, MD;  Location: [facility] OR;  Service: Vascular;  Laterality: Left;  . AMPUTATION Left 07/02/2019   Procedure: left AMPUTATION ABOVE KNEE;  Surgeon: Larina Earthly, MD;  Location: [facility] OR;  Service: Vascular;  Laterality: Left;  . APPENDECTOMY    . CATARACT EXTRACTION W/ INTRAOCULAR LENS  IMPLANT, BILATERAL    . CHOLECYSTECTOMY N/A 09/08/2014   Procedure: CHOLECYSTECTOMY;  Surgeon: Franky Macho Md, MD;  Location: [facility] ORS;  Service: General;  Laterality: N/A;  . EYE SURGERY    . I & D EXTREMITY Left 06/30/2019   Procedure: sharp incisonal DEBRIDEMENT of left foot including bone, tendon, muscle, and skin;  Surgeon: Cephus Shelling, MD;  Location: [facility] OR;  Service: Vascular;  Laterality: Left;  . LOWER EXTREMITY ANGIOGRAPHY Left 05/24/2019   Procedure: Lower Extremity Angiography;  Surgeon: Maeola Harman, MD;  Location: [facility] INVASIVE CV LAB;  Service: Cardiovascular;  Laterality: Left;  . PERIPHERAL VASCULAR BALLOON ANGIOPLASTY Left 05/24/2019   Procedure: PERIPHERAL VASCULAR BALLOON ANGIOPLASTY;  Surgeon: Maeola Harman, MD;  Location: [facility] INVASIVE CV LAB;  Service: Cardiovascular;  Laterality: Left;  tibial bypass  . PR VEIN BYPASS GRAFT,AORTO-FEM-POP     Left above knee popliteal to posterior tibial artery bypass graft       Family History  Problem Relation Age of Onset  . Heart disease Mother   . Hypertension Mother   . Heart  attack Mother   . Heart attack Father   . Heart disease Father        Heart disease before age 66  . Cancer Sister   . Deep vein thrombosis Sister   . Diabetes Sister        Amputation-right leg  . Cancer Daughter   . Diabetes Daughter   . Cancer Brother   . Diabetes Other   . Prostate cancer Other     Social History   Tobacco Use  . Smoking status: Former Smoker    Types: Cigars    Quit date: 05/27/1988    Years since quitting: 31.9  . Smokeless tobacco: Former Neurosurgeon    Types: Chew    Quit date: 05/27/1988  Vaping Use  . Vaping Use: Never used  Substance Use Topics  . Alcohol use: No    Alcohol/week: 0.0 standard drinks  . Drug use: No    Home Medications Prior to Admission medications   Medication Sig Start Date End Date Taking? Authorizing Provider  apixaban (ELIQUIS) 2.5 MG TABS tablet Take 1 tablet (2.5 mg total) by mouth 2 (two) times daily. 03/27/20  Yes Dyann Kief, PA-C  aspirin EC 81 MG tablet Take 81 mg by mouth daily. Swallow whole.   Yes [provider]  atorvastatin (LIPITOR) 20 MG tablet Take 1 tablet (20 mg total) by mouth daily at 6 PM. 03/07/20  Yes Allena Katz, Earlie Lou, MD  furosemide (LASIX) 40 MG tablet Take 1 tablet (40 mg total) by mouth daily. 02/22/20 02/21/21 Yes Burnadette Pop, MD  lubiprostone (AMITIZA) 24 MCG capsule Take 1 capsule (24 mcg total) by mouth 2 (two) times daily with a meal. 03/07/20  Yes Anabel Halon, MD  metFORMIN (GLUCOPHAGE) 500 MG tablet Take 1 tablet (500 mg total) by mouth 2 (two) times daily with a meal. 03/09/20  Yes Anabel Halon, MD  metoprolol tartrate (LOPRESSOR) 50 MG tablet Take 50 mg by mouth 2 (two) times daily.   Yes [provider]  pantoprazole (PROTONIX) 40 MG tablet Take 1 tablet (40 mg total) by mouth daily. 04/28/20  Yes Bethann Berkshire, MD  potassium chloride 20 MEQ TBCR Take 20 mEq by mouth daily. 02/22/20  Yes Burnadette Pop, MD  senna (SENOKOT) 8.6 MG TABS tablet Take 1 tablet (8.6 mg  total) by mouth daily as needed for mild constipation. 03/07/20  Yes Anabel Halon, MD    Allergies    Patient has no known allergies.  Review of Systems   Review of Systems  Physical Exam Updated Vital Signs BP (!) 155/136   Pulse (!) 137   Temp 98 F (36.7 C)   Resp 17   SpO2 97%   Physical Exam  ED Results / Procedures / Treatments   Labs (all labs ordered are listed, but only abnormal results are displayed) Labs Reviewed  LACTIC ACID, PLASMA - Abnormal; Notable for the following components:      Result Value   Lactic Acid, Venous 6.5 (*)    All other components within normal limits  LACTIC ACID, PLASMA - Abnormal; Notable for the following components:   Lactic Acid, Venous 7.9 (*)  All other components within normal limits  COMPREHENSIVE METABOLIC PANEL - Abnormal; Notable for the following components:   Sodium 129 (*)    Potassium 6.6 (*)    CO2 13 (*)    Glucose, Bld 206 (*)    BUN 54 (*)    Creatinine, Ser 2.58 (*)    Calcium 8.8 (*)    AST 672 (*)    ALT 426 (*)    Alkaline Phosphatase 131 (*)    Total Bilirubin 2.3 (*)    GFR, Estimated 23 (*)    All other components within normal limits  CBC WITH DIFFERENTIAL/PLATELET - Abnormal; Notable for the following components:   RBC 3.84 (*)    Hemoglobin 11.7 (*)    HCT 37.1 (*)    All other components within normal limits  LACTIC ACID, PLASMA - Abnormal; Notable for the following components:   Lactic Acid, Venous 5.0 (*)    All other components within normal limits  BASIC METABOLIC PANEL - Abnormal; Notable for the following components:   Sodium 133 (*)    Potassium 5.3 (*)    CO2 14 (*)    Glucose, Bld 155 (*)    BUN 57 (*)    Creatinine, Ser 2.56 (*)    Calcium 8.8 (*)    GFR, Estimated 23 (*)    All other components within normal limits  BLOOD GAS, ARTERIAL - Abnormal; Notable for the following components:   pCO2 arterial 24.4 (*)    Bicarbonate 17.8 (*)    Acid-base deficit 8.9 (*)    All  other components within normal limits  TROPONIN I (HIGH SENSITIVITY) - Abnormal; Notable for the following components:   Troponin I (High Sensitivity) 939 (*)    All other components within normal limits  TROPONIN I (HIGH SENSITIVITY) - Abnormal; Notable for the following components:   Troponin I (High Sensitivity) 838 (*)    All other components within normal limits  RESP PANEL BY RT-PCR (FLU A&B, COVID) ARPGX2  CULTURE, BLOOD (ROUTINE X 2)  CULTURE, BLOOD (ROUTINE X 2)    EKG EKG Interpretation  Date/Time:  Friday April 28 2020 21:20:29 EST Ventricular Rate:  107 PR Interval:    QRS Duration: 114 QT Interval:  383 QTC Calculation: 511 R Axis:   -34 Text Interpretation: Atrial fibrillation Incomplete right bundle branch block Repol abnrm suggests ischemia, lateral leads Prolonged QT interval No significant change since last tracing Confirmed by Benjiman Core [phone]) on 04/28/2020 10:02:07 PM   Radiology DG Abdomen Acute W/Chest  Result Date: 04/28/2020 CLINICAL DATA:  Abdominal pain EXAM: DG ABDOMEN ACUTE WITH 1 VIEW CHEST COMPARISON:  04/28/2020 FINDINGS: Supine and upright frontal views of the abdomen as well as an upright frontal view of the chest are obtained. Cardiac silhouette is stable. No airspace disease. Small bilateral effusions. No pneumothorax. No bowel obstruction or ileus. Continued gaseous distention of the colon. Excreted contrast within the urinary bladder from previous CT. No free gas in the greater peritoneal sac. No abdominal masses or abnormal calcifications. IMPRESSION: 1. No evidence of bowel obstruction or ileus. 2. Stable gaseous distension of the sigmoid colon. 3. Small bilateral pleural effusions. Electronically Signed   By: Sharlet Salina M.D.   On: 04/28/2020 22:42    Procedures Procedures (including critical care time)  Medications Ordered in ED Medications  DOBUTamine (DOBUTREX) infusion 4000 mcg/mL (2.5 mcg/kg/min  97.5 kg Intravenous  Rate/Dose Verify 04/29/20 0924)  sodium chloride 0.9 % bolus 500 mL (0 mLs Intravenous  Stopped 04/28/20 2346)  lactated ringers bolus 1,000 mL (0 mLs Intravenous Stopped 04/29/20 0119)  sodium zirconium cyclosilicate (LOKELMA) packet 10 g (10 g Oral Given 04/29/20 0022)  sodium bicarbonate injection 50 mEq (50 mEq Intravenous Given 04/29/20 0040)  furosemide (LASIX) injection 40 mg (40 mg Intravenous Given 04/29/20 0119)    ED Course  I have reviewed the triage vital signs and the nursing notes.  Pertinent labs & imaging results that were available during my care of the patient were reviewed by me and considered in my medical decision making (see chart for details).    MDM Rules/Calculators/A&P                           Final Clinical Impression(s) / ED Diagnoses Final diagnoses:  Generalized abdominal pain  Duodenitis  Hypotension, unspecified hypotension type  AKI (acute kidney injury) (HCC)  Hyperkalemia    Rx / DC Orders ED Discharge Orders    None       Bethann Berkshire, MD 05/01/20 1006

## 2020-04-30 NOTE — Progress Notes (Signed)
Contacted by EDP after 40-hour wait at [facility], ED.  He is noted to have atrial fibrillation with RVR on dobutamine which has now been discontinued per PCCM recommendations.  His blood pressures were noted to be dropping shortly after discontinuation, but BP rechecks appear to demonstrate some stability.  Heart rates remain in the 130 bpm range.  Plan to bolus 500 mL NS and administer digoxin to see if heart rates and blood pressures can be improved further and if patient would be stable enough to remain at [facility].  2D echocardiogram was ordered for further evaluation.  Further recommendations pending this intervention.  Total care time: 35 minutes.

## 2020-04-30 NOTE — H&P (Addendum)
History and Physical    Warren Mcdonald EXH:[id number] DOB: 07-04-1929 DOA: 04/28/2020  PCP: Anabel Halon, MD   Patient coming from: Home  Chief Complaint: Abdominal pain  HPI: Warren Mcdonald is a 86 y.o. male with medical history significant for persistent atrial fibrillation on Eliquis, chronic systolic heart failure with LVEF 40-45%, hypertension, PAD status post left AKA, dyslipidemia, CKD stage IIIb, and type 2 diabetes who presented to the ED on 12/3 with complaints of abdominal pain and bloating with very poor oral intake.  He felt like he had a lot of gas in his abdomen and states that the pain is quite diffuse.  He was given some IV fluid in the ED, but was noted to have some peripheral edema initially along with soft blood pressure readings for which she was placed on dobutamine for blood pressure support.  He was also noted to be hyperkalemic and was treated with Lokelma and bicarbonate.  He denies any chest pain, pressure, palpitations, shortness of breath, or further abdominal pain at this time.  He has remained in the ED over 40 hours anticipating transfer to ICU at [facility] for further care with PCCM and cardiology.  His heart rates have remained quite elevated and therefore PCCM has recommended discontinuation of dobutamine.   ED Course: Blood pressures have continued to remain soft and he was given digoxin in an attempt to help control heart rate, but this has not helped.  He will be started on amiodarone bolus and drip for further heart rate control.  Chest x-ray with no acute findings and abdominal films with no acute findings.  Covid testing negative.  Creatinine currently around 2.2 with baseline near 1.5-1.8.  Lactic acidosis has improved with lactic acid of 2.7.  He is noted to have troponin elevation which was initially 939 and followed by 838.  His lipase is 26.  LFTs are elevated with AST 664 and ALT 660.  Review of Systems: All others reviewed and otherwise negative  except as noted above.  Past Medical History:  Diagnosis Date  . Abdominal distension (gaseous)   . Anemia, unspecified   . Chronic diastolic (congestive) heart failure (HCC)   . Essential (primary) hypertension   . Hyperlipidemia   . PAF (paroxysmal atrial fibrillation) (HCC)   . Peripheral vascular disease (HCC) 2011   Left above-knee popliteal to posterior tibial artery bypass   . PUD (peptic ulcer disease)   . Type 2 diabetes mellitus with diabetic peripheral angiopathy without gangrene [facility] [place])     Past Surgical History:  Procedure Laterality Date  . ABDOMINAL AORTOGRAM N/A 05/24/2019   Procedure: ABDOMINAL AORTOGRAM;  Surgeon: Maeola Harman, MD;  Location: [facility] INVASIVE CV LAB;  Service: Cardiovascular;  Laterality: N/A;  . AMPUTATION Left 06/16/2019   Procedure: AMPUTATION DIGIT FOURTH TOE LEFT FOOT;  Surgeon: Nada Libman, MD;  Location: [facility] OR;  Service: Vascular;  Laterality: Left;  . AMPUTATION Left 06/26/2019   Procedure: AMPUTATION two DIGITs, left toe;  Surgeon: Maeola Harman, MD;  Location: [facility] OR;  Service: Vascular;  Laterality: Left;  . AMPUTATION Left 07/02/2019   Procedure: left AMPUTATION ABOVE KNEE;  Surgeon: Larina Earthly, MD;  Location: [facility] OR;  Service: Vascular;  Laterality: Left;  . APPENDECTOMY    . CATARACT EXTRACTION W/ INTRAOCULAR LENS  IMPLANT, BILATERAL    . CHOLECYSTECTOMY N/A 09/08/2014   Procedure: CHOLECYSTECTOMY;  Surgeon: Franky Macho Md, MD;  Location: [facility] ORS;  Service: General;  Laterality: N/A;  .  EYE SURGERY    . I & D EXTREMITY Left 06/30/2019   Procedure: sharp incisonal DEBRIDEMENT of left foot including bone, tendon, muscle, and skin;  Surgeon: Cephus Shelling, MD;  Location: [facility] OR;  Service: Vascular;  Laterality: Left;  . LOWER EXTREMITY ANGIOGRAPHY Left 05/24/2019   Procedure: Lower Extremity Angiography;  Surgeon: Maeola Harman, MD;  Location: [facility] INVASIVE CV LAB;  Service: Cardiovascular;  Laterality:  Left;  . PERIPHERAL VASCULAR BALLOON ANGIOPLASTY Left 05/24/2019   Procedure: PERIPHERAL VASCULAR BALLOON ANGIOPLASTY;  Surgeon: Maeola Harman, MD;  Location: [facility] INVASIVE CV LAB;  Service: Cardiovascular;  Laterality: Left;  tibial bypass  . PR VEIN BYPASS GRAFT,AORTO-FEM-POP     Left above knee popliteal to posterior tibial artery bypass graft     reports that he quit smoking about 31 years ago. His smoking use included cigars. He quit smokeless tobacco use about 31 years ago.  His smokeless tobacco use included chew. He reports that he does not drink alcohol and does not use drugs.  No Known Allergies  Family History  Problem Relation Age of Onset  . Heart disease Mother   . Hypertension Mother   . Heart attack Mother   . Heart attack Father   . Heart disease Father        Heart disease before age 33  . Cancer Sister   . Deep vein thrombosis Sister   . Diabetes Sister        Amputation-right leg  . Cancer Daughter   . Diabetes Daughter   . Cancer Brother   . Diabetes Other   . Prostate cancer Other     Prior to Admission medications   Medication Sig Start Date End Date Taking? Authorizing Provider  apixaban (ELIQUIS) 2.5 MG TABS tablet Take 1 tablet (2.5 mg total) by mouth 2 (two) times daily. 03/27/20  Yes Dyann Kief, PA-C  atorvastatin (LIPITOR) 20 MG tablet Take 1 tablet (20 mg total) by mouth daily at 6 PM. 03/07/20  Yes Allena Katz, Earlie Lou, MD  pantoprazole (PROTONIX) 40 MG tablet Take 1 tablet (40 mg total) by mouth daily. 04/28/20  Yes Bethann Berkshire, MD    Physical Exam: Vitals:   04/30/20 1245 04/30/20 1300 04/30/20 1311 04/30/20 1315  BP:   99/86 97/64  Pulse:  (!) 126 98 (!) 109  Resp: (!) 22 (!) 29 (!) 26 (!) 34  Temp:      TempSrc:      SpO2:  (!) 86% 97% 100%    Constitutional: NAD, calm, comfortable Vitals:   04/30/20 1245 04/30/20 1300 04/30/20 1311 04/30/20 1315  BP:   99/86 97/64  Pulse:  (!) 126 98 (!) 109  Resp: (!) 22 (!) 29 (!)  26 (!) 34  Temp:      TempSrc:      SpO2:  (!) 86% 97% 100%   Eyes: lids and conjunctivae normal ENMT: Mucous membranes are moist.  Neck: normal, supple Respiratory: clear to auscultation bilaterally. Normal respiratory effort. No accessory muscle use.  Cardiovascular: Tachycardic and irregular Abdomen: no tenderness, no distention. Bowel sounds positive.  Musculoskeletal: Left AKA Skin: no rashes, lesions, ulcers.  Psychiatric: Flat affect  Labs on Admission: I have personally reviewed following labs and imaging studies  CBC: Recent Labs  Lab 04/28/20 0441 04/28/20 2303 04/30/20 1136  WBC 6.1 8.2 5.7  NEUTROABS 4.5 6.1 4.2  HGB 12.0* 11.7* 10.6*  HCT 38.0* 37.1* 33.1*  MCV 95.0 96.6 94.0  PLT 232  204 147*   Basic Metabolic Panel: Recent Labs  Lab 04/28/20 0441 04/28/20 2303 04/29/20 0703 04/30/20 1136  NA 132* 129* 133* 134*  K 5.2* 6.6* 5.3* 3.7  CL 100 102 104 104  CO2 19* 13* 14* 17*  GLUCOSE 185* 206* 155* 213*  BUN 40* 54* 57* 56*  CREATININE 1.78* 2.58* 2.56* 2.21*  CALCIUM 9.5 8.8* 8.8* 8.8*   GFR: Estimated Creatinine Clearance: 28.4 mL/min (A) (by C-G formula based on SCr of 2.21 mg/dL (H)). Liver Function Tests: Recent Labs  Lab 04/28/20 0441 04/28/20 2303 04/30/20 1136  AST 55* 672* 664*  ALT 36 426* 660*  ALKPHOS 93 131* 113  BILITOT 2.0* 2.3* 2.0*  PROT 8.0 7.9 7.2  ALBUMIN 4.1 3.9 3.8   Recent Labs  Lab 04/28/20 0441  LIPASE 26   No results for input(s): AMMONIA in the last 168 hours. Coagulation Profile: No results for input(s): INR, PROTIME in the last 168 hours. Cardiac Enzymes: No results for input(s): CKTOTAL, CKMB, CKMBINDEX, TROPONINI in the last 168 hours. BNP (last 3 results) No results for input(s): PROBNP in the last 8760 hours. HbA1C: No results for input(s): HGBA1C in the last 72 hours. CBG: No results for input(s): GLUCAP in the last 168 hours. Lipid Profile: No results for input(s): CHOL, HDL, LDLCALC, TRIG,  CHOLHDL, LDLDIRECT in the last 72 hours. Thyroid Function Tests: No results for input(s): TSH, T4TOTAL, FREET4, T3FREE, THYROIDAB in the last 72 hours. Anemia Panel: No results for input(s): VITAMINB12, FOLATE, FERRITIN, TIBC, IRON, RETICCTPCT in the last 72 hours. Urine analysis:    Component Value Date/Time   COLORURINE YELLOW 04/28/2020 0459   APPEARANCEUR HAZY (A) 04/28/2020 0459   LABSPEC 1.019 04/28/2020 0459   PHURINE 5.0 04/28/2020 0459   GLUCOSEU NEGATIVE 04/28/2020 0459   HGBUR SMALL (A) 04/28/2020 0459   BILIRUBINUR NEGATIVE 04/28/2020 0459   KETONESUR NEGATIVE 04/28/2020 0459   PROTEINUR 100 (A) 04/28/2020 0459   UROBILINOGEN 1.0 09/03/2014 0930   NITRITE NEGATIVE 04/28/2020 0459   LEUKOCYTESUR NEGATIVE 04/28/2020 0459    Radiological Exams on Admission: DG Chest Port 1 View  Result Date: 04/30/2020 CLINICAL DATA:  Weakness EXAM: PORTABLE CHEST 1 VIEW COMPARISON:  02/20/2020 FINDINGS: Cardiac shadow is enlarged but stable. Lungs are well aerated bilaterally. No focal infiltrate or effusion is seen. No bony abnormality is noted. IMPRESSION: No active disease. Electronically Signed   By: Alcide Clever M.D.   On: 04/30/2020 12:20   DG Abdomen Acute W/Chest  Result Date: 04/28/2020 CLINICAL DATA:  Abdominal pain EXAM: DG ABDOMEN ACUTE WITH 1 VIEW CHEST COMPARISON:  04/28/2020 FINDINGS: Supine and upright frontal views of the abdomen as well as an upright frontal view of the chest are obtained. Cardiac silhouette is stable. No airspace disease. Small bilateral effusions. No pneumothorax. No bowel obstruction or ileus. Continued gaseous distention of the colon. Excreted contrast within the urinary bladder from previous CT. No free gas in the greater peritoneal sac. No abdominal masses or abnormal calcifications. IMPRESSION: 1. No evidence of bowel obstruction or ileus. 2. Stable gaseous distension of the sigmoid colon. 3. Small bilateral pleural effusions. Electronically Signed    By: Sharlet Salina M.D.   On: 04/28/2020 22:42    EKG: Independently reviewed. Afib RVR 135bpm.  Assessment/Plan Active Problems:   Hypotension    Hypotension with concern for cardiogenic shock -Patient was on dobutamine drip which has now been weaned -Continue on fluid boluses as needed -Cardiology consult in a.m. to assist with further  evaluation -2D echocardiogram ordered  Atrial fibrillation with RVR -Patient has history of persistent atrial fibrillation that is normally rate controlled -Removal of dobutamine to assist with heart rates -Trial of digoxin which did not seem to help -Due to soft blood pressure readings, will trial amiodarone bolus and drip -Continue on Eliquis for anticoagulation -Home metoprolol was recently decreased to 25 mg twice daily due to soft blood pressure readings in cardiology office, plan to hold metoprolol for now due to soft blood pressure readings  Elevated troponin -No chest pain noted, likely demand ischemia in setting of A. fib with RVR as noted above -Troponin levels are downtrending -Check 2D echocardiogram -Monitor on telemetry -No need for heparin drip at this time  AKI on CKD stage IIIb-likely prerenal -Related to above with poor oral intake -Baseline creatinine 1.5-1.8 -Appears to be slowly improving -Continue gentle IV fluid and avoid nephrotoxic agents -Monitor strict I's and O's  Abdominal pain with transaminitis -May be related to congestive hepatopathy in the setting of above with CHF -Plan to check right upper quadrant ultrasound -Hepatitis panel -Patient currently on diet and appears to be tolerating  Hyperkalemia-resolved -Recheck labs in a.m.  Chronic systolic CHF -Does not appear to be in overt failure -Check BNP levels -Plan to provide IV fluid and boluses carefully -2D echocardiogram pending  Lactic acidosis-improving -Continue to monitor and repeat labs -No signs of sepsis, likely related to  hypotension  Normocytic anemia acute on chronic -Plan to check anemia panel -Check stool occult -No overt bleeding currently noted -Recheck CBC  Type 2 diabetes with mild hyperglycemia -Carb modified diet and maintain on SSI  PAD status post left AKA  DVT prophylaxis: Eliquis Code Status: Full Family Communication: Wife at bedside Disposition Plan: Admit for heart rate control and cardiology evaluation Consults called:Cardiology for AM Admission status: Inpatient, SDU   Muskan Bolla D Zamaya Rapaport DO [facility]  If 7PM-7AM, please contact night-coverage [email or web address]  04/30/2020, 1:43 PM

## 2020-04-30 NOTE — Progress Notes (Addendum)
Re: Warren Mcdonald  DOB: 1929-12-23 Date of admit 04/28/2020  9:06 PM  Thora Lance Ivor Costa, MD - current attending of record on Epic. He is CCM fellow at night for [place] but patient still at [facility] in [place]    Call from DR Bronwen Betters ER 11:37 AM 04/30/2020 . Says patient in [place] ER for 40h awaiting bed at [facility] ICU. I Am at [facility] rounding doc - took call via [phone] pager   Calling for HR of 140 - advice. A Fib RVR. On Dobutamine  revie of chart indicates - NO LABS  SBP - 97-100 Last EKG 2d ago EF 45% in May 2021  Per DR Zamit - not on vent. No CVL  No CXR since admit  Mild hyperkalemia 1 day ago Ha AKI Has lactic acidosis  Plan  - stop dobutamine  - repeatk EKG  - repeat ABG  - repeat BMET, cBC, LFT, LACTATE, Trop - get CXR -ER to be primary for the moment  -based on above can reassess if really needs to come to [place] or stay in [place] on CCM service       SIGNATURE    Dr. Kalman Shan, M.D., F.C.C.P,  Pulmonary and Critical Care Medicine Staff Physician, [facility] Director - Interstitial Lung Disease  Program  Pulmonary Fibrosis [facility] at [facility] [place], [place], [address]  Pager: [phone], If no answer  OR between  19:00-7:00h: page [phone] Telephone (clinical office): [phone] Telephone (research): [phone]  11:43 AM 04/30/2020      LABS   Results for Warren Mcdonald, Warren Mcdonald (MRN [id number]) as of 04/30/2020 11:39  Ref. Range 07/02/2019 18:07 04/29/2020 07:03 04/29/2020 11:02  Troponin I (High Sensitivity) Latest Ref Range: <18 ng/L 8 939 (HH) 838 (HH)   PULMONARY Recent Labs  Lab 04/29/20 1115  PHART 7.403  PCO2ART 24.4*  PO2ART 89.2  HCO3 17.8*  O2SAT 96.0    CBC Recent Labs  Lab 04/28/20 0441 04/28/20 2303  HGB 12.0* 11.7*  HCT 38.0* 37.1*  WBC 6.1 8.2  PLT 232 204    COAGULATION No results for input(s): INR in the last 168 hours.   CARDIAC  No results for input(s): TROPONINI in the last 168 hours. No results for input(s): PROBNP in the last 168 hours.   CHEMISTRY Recent Labs  Lab 04/28/20 0441 04/28/20 0441 04/28/20 2303 04/29/20 0703  NA 132*  --  129* 133*  K 5.2*   < > 6.6* 5.3*  CL 100  --  102 104  CO2 19*  --  13* 14*  GLUCOSE 185*  --  206* 155*  BUN 40*  --  54* 57*  CREATININE 1.78*  --  2.58* 2.56*  CALCIUM 9.5  --  8.8* 8.8*   < > = values in this interval not displayed.   Estimated Creatinine Clearance: 24.5 mL/min (A) (by C-G formula based on SCr of 2.56 mg/dL (H)).   LIVER Recent Labs  Lab 04/28/20 0441 04/28/20 2303  AST 55* 672*  ALT 36 426*  ALKPHOS 93 131*  BILITOT 2.0* 2.3*  PROT 8.0 7.9  ALBUMIN 4.1 3.9     INFECTIOUS Recent Labs  Lab 04/28/20 2303 04/29/20 0106 04/29/20 0703  LATICACIDVEN 6.5* 7.9* 5.0*     ENDOCRINE CBG (last 3)  No results for input(s): GLUCAP in the last 72 hours.  IMAGING x48h  - image(s) personally visualized  -   highlighted in bold DG Abdomen Acute W/Chest  Result Date: 04/28/2020 CLINICAL DATA:  Abdominal pain EXAM: DG ABDOMEN ACUTE WITH 1 VIEW CHEST COMPARISON:  04/28/2020 FINDINGS: Supine and upright frontal views of the abdomen as well as an upright frontal view of the chest are obtained. Cardiac silhouette is stable. No airspace disease. Small bilateral effusions. No pneumothorax. No bowel obstruction or ileus. Continued gaseous distention of the colon. Excreted contrast within the urinary bladder from previous CT. No free gas in the greater peritoneal sac. No abdominal masses or abnormal calcifications. IMPRESSION: 1. No evidence of bowel obstruction or ileus. 2. Stable gaseous distension of the sigmoid colon. 3. Small bilateral pleural effusions. Electronically Signed   By: Sharlet Salina M.D.   On: 04/28/2020 22:42

## 2020-05-01 ENCOUNTER — Inpatient Hospital Stay (HOSPITAL_COMMUNITY): Payer: Medicare Other

## 2020-05-01 ENCOUNTER — Ambulatory Visit: Payer: Medicare Other | Admitting: Internal Medicine

## 2020-05-01 DIAGNOSIS — I4891 Unspecified atrial fibrillation: Secondary | ICD-10-CM

## 2020-05-01 DIAGNOSIS — I361 Nonrheumatic tricuspid (valve) insufficiency: Secondary | ICD-10-CM

## 2020-05-01 DIAGNOSIS — I5023 Acute on chronic systolic (congestive) heart failure: Secondary | ICD-10-CM | POA: Diagnosis present

## 2020-05-01 DIAGNOSIS — I340 Nonrheumatic mitral (valve) insufficiency: Secondary | ICD-10-CM | POA: Diagnosis not present

## 2020-05-01 DIAGNOSIS — I214 Non-ST elevation (NSTEMI) myocardial infarction: Principal | ICD-10-CM

## 2020-05-01 LAB — ECHOCARDIOGRAM COMPLETE
AR max vel: 1.7 cm2
AV Area VTI: 2.07 cm2
AV Area mean vel: 1.63 cm2
AV Mean grad: 1.2 mmHg
AV Peak grad: 2.3 mmHg
Ao pk vel: 0.77 m/s
Area-P 1/2: 4.1 cm2
Height: 73 in
MV M vel: 4.36 m/s
MV Peak grad: 76 mmHg
S' Lateral: 4.94 cm
Weight: 3192.26 oz

## 2020-05-01 LAB — GLUCOSE, CAPILLARY
Glucose-Capillary: 142 mg/dL — ABNORMAL HIGH (ref 70–99)
Glucose-Capillary: 141 mg/dL — ABNORMAL HIGH (ref 70–99)
Glucose-Capillary: 171 mg/dL — ABNORMAL HIGH (ref 70–99)
Glucose-Capillary: 104 mg/dL — ABNORMAL HIGH (ref 70–99)

## 2020-05-01 LAB — LACTIC ACID, PLASMA
Lactic Acid, Venous: 2.9 mmol/L (ref 0.5–1.9)
Lactic Acid, Venous: 3 mmol/L (ref 0.5–1.9)

## 2020-05-01 LAB — COMPREHENSIVE METABOLIC PANEL
ALT: 605 U/L — ABNORMAL HIGH (ref 0–44)
AST: 448 U/L — ABNORMAL HIGH (ref 15–41)
Albumin: 3.6 g/dL (ref 3.5–5.0)
Alkaline Phosphatase: 105 U/L (ref 38–126)
Anion gap: 11 (ref 5–15)
BUN: 58 mg/dL — ABNORMAL HIGH (ref 8–23)
CO2: 17 mmol/L — ABNORMAL LOW (ref 22–32)
Calcium: 8.4 mg/dL — ABNORMAL LOW (ref 8.9–10.3)
Chloride: 106 mmol/L (ref 98–111)
Creatinine, Ser: 2.22 mg/dL — ABNORMAL HIGH (ref 0.61–1.24)
GFR, Estimated: 28 mL/min — ABNORMAL LOW (ref >60)
Glucose, Bld: 172 mg/dL — ABNORMAL HIGH (ref 70–99)
Potassium: 3.7 mmol/L (ref 3.5–5.1)
Sodium: 134 mmol/L — ABNORMAL LOW (ref 135–145)
Total Bilirubin: 1.9 mg/dL — ABNORMAL HIGH (ref 0.3–1.2)
Total Protein: 6.9 g/dL (ref 6.5–8.1)

## 2020-05-01 LAB — CBC
HCT: 34.5 % — ABNORMAL LOW (ref 39.0–52.0)
Hemoglobin: 10.9 g/dL — ABNORMAL LOW (ref 13.0–17.0)
MCH: 30 pg (ref 26.0–34.0)
MCHC: 31.6 g/dL (ref 30.0–36.0)
MCV: 95 fL (ref 80.0–100.0)
Platelets: 136 10*3/uL — ABNORMAL LOW (ref 150–400)
RBC: 3.63 MIL/uL — ABNORMAL LOW (ref 4.22–5.81)
RDW: 15.6 % — ABNORMAL HIGH (ref 11.5–15.5)
WBC: 6.7 10*3/uL (ref 4.0–10.5)
nRBC: 0.4 % — ABNORMAL HIGH (ref 0.0–0.2)

## 2020-05-01 LAB — HEMOGLOBIN A1C
Hgb A1c MFr Bld: 7.8 % — ABNORMAL HIGH (ref 4.8–5.6)
Mean Plasma Glucose: 177.16 mg/dL

## 2020-05-01 LAB — HEPATITIS PANEL, ACUTE
HCV Ab: NONREACTIVE
Hep A IgM: NONREACTIVE
Hep B C IgM: NONREACTIVE
Hepatitis B Surface Ag: NONREACTIVE

## 2020-05-01 LAB — APTT: aPTT: 98 seconds — ABNORMAL HIGH (ref 24–36)

## 2020-05-01 LAB — MRSA PCR SCREENING: MRSA by PCR: NEGATIVE

## 2020-05-01 LAB — MAGNESIUM: Magnesium: 1.8 mg/dL (ref 1.7–2.4)

## 2020-05-01 LAB — HEPARIN LEVEL (UNFRACTIONATED): Heparin Unfractionated: 2 IU/mL — ABNORMAL HIGH (ref 0.30–0.70)

## 2020-05-01 MED ORDER — HEPARIN (PORCINE) 25000 UT/250ML-% IV SOLN
1200.0000 [IU]/h | INTRAVENOUS | Status: DC
  Administered 2020-05-01: 1200 [IU]/h via INTRAVENOUS
  Filled 2020-05-01: qty 250

## 2020-05-01 MED ORDER — METOPROLOL TARTRATE 25 MG PO TABS
12.5000 mg | ORAL_TABLET | Freq: Two times a day (BID) | ORAL | Status: DC
  Administered 2020-05-01 – 2020-05-02 (×2): 12.5 mg via ORAL
  Filled 2020-05-01 (×2): qty 1

## 2020-05-01 MED ORDER — ATORVASTATIN CALCIUM 40 MG PO TABS
40.0000 mg | ORAL_TABLET | Freq: Every day | ORAL | Status: DC
  Administered 2020-05-01: 40 mg via ORAL
  Filled 2020-05-01: qty 1

## 2020-05-01 MED ORDER — ASPIRIN 81 MG PO CHEW
81.0000 mg | CHEWABLE_TABLET | Freq: Every day | ORAL | Status: DC
  Administered 2020-05-01 – 2020-05-03 (×2): 81 mg via ORAL
  Filled 2020-05-01 (×3): qty 1

## 2020-05-01 NOTE — Progress Notes (Signed)
Patient Demographics:    Warren Mcdonald, is a 86 y.o. male, DOB - 07/28/1929, ZHY:[id number]  Admit date - 04/28/2020   Admitting Physician Warren Hoover Brunette, DO  Outpatient Primary MD for the patient is Warren Halon, MD  LOS - 2   Chief Complaint  Patient presents with  . Abdominal Pain        Subjective:    Warren Mcdonald today has no fevers, no emesis,  No chest pain,  -Daughter at bedside, questions answered  Assessment  & Plan :    Principal Problem:   NSTEMI (non-ST elevated myocardial infarction)--with cardiogenic shock Active Problems:   Acute on chronic [name] (heart failure with reduced ejection fraction) --worse due to cardiogenic shock in the setting of NSTEMI    DM (diabetes mellitus) (HCC)   Atrial fibrillation with rapid ventricular response (HCC)   Chronic combined systolic and diastolic congestive heart failure (HCC)   CKD (chronic kidney disease) stage 3, GFR 30-59 ml/min (HCC)   Hypotension   Atrial fibrillation with RVR (HCC)  Brief Summary:- [name] with history of [name], AF , HTN, PAD and s/p Lt AKA, HLD, DM2, CKD IIIb,  with suspected decompensated right and left heart failure and AKI admitted on 04/30/2020 with persistent hypotension and found to have NSTEMI with cardiogenic shock and persistent hypotension as well as A. fib with RVR and CHF exacerbation with significant drop in EF from 40-45 % back in May 2021 down to 15 % this admission  A/p 1)[name]--- acute on chronic systolic CHF exacerbation due to NSTEMI with cardiogenic shock -- Echo from 05/01/2020 with EF of 15%, with global hypokinesis -Echo from 10/11/2019 had patient's EF at 40 to 45% -BNP is up to 2416 -Hypotension has resolved --patient has been taken off dobutamine  -We will request cardiology input -IV heparin, low-dose metoprolol , Lipitor and aspirin ordered  2 A. fib with RVR--- switch from Eliquis to IV  heparin for anticoagulation due to NSTEMI -Challenges with rate control due to soft BP -Overall rate control improved with amiodarone drip -Low-dose metoprolol with hold parameters  3)AKI----acute kidney injury on CKD stage -3B--worsening renal function due to persistent hypotension due to worsening heart failure and presumed NSTEMI ----   creatinine on admission= 1.78, baseline creatinine = 1.5 to 1.6   , creatinine is now= 2.56 (peak was 2.58) --- renally adjust medications, avoid nephrotoxic agents / dehydration  / hypotension  4)PAD--- s/p Lt AKA , Lipitor and IV heparin as ordered  5)DM2-A1c 7.8 reflecting uncontrolled DM with hyperglycemia PTA  -use Novolog/Humalog Sliding scale insulin with Accu-Cheks/Fingersticks as ordered   6) hypotension and lactic acidosis most likely due to cardiogenic shock above Rather than  frank sepsis  7) elevated LFTs--- due to persistent hypotension/shock liver, abdominal/liver ultrasound with possible hepatic steatosis and elevated bili  8) elevated troponin--suspect NSTEMI with cardiogenic shock--- Echo with significant drop in EF and global hypokinesis,   ----we will discuss with cardiology service regarding need for possible ischemia work-up -Low-dose metoprolol with hold parameters due to soft BP --IV heparin, , Lipitor and aspirin ordered -Worsening renal function may preclude LHC  9) chronic iron deficiency anemia--- work-up reveals iron deficiency, folate and B12 are WNL -Not a candidate for endoluminal evaluation  due to hemodynamic instability  Disposition/Need for in-Hospital Stay- patient unable to be discharged at this time due to -NSTEMI with cardiogenic shock, persistent hypotension, A. fib with RVR, CHF exacerbation secondary to NSTEMI requiring further cardiology evaluation on IV heparin  Status is: Inpatient  Remains inpatient appropriate because:NSTEMI with cardiogenic shock, persistent hypotension, A. fib with RVR, CHF  exacerbation secondary to NSTEMI requiring further cardiology evaluation on IV heparin   Disposition: The patient is from: Home              Anticipated d/c is to: TBD after PT eval when more medically stable              Anticipated d/c date is: > 3 days              Patient currently is not medically stable to d/c. Barriers: Not Clinically Stable-   Code Status :  -  Code Status: Full Code   Family Communication:    NA (patient is alert, awake and coherent)   Consults  :  Cardiology/palliative  DVT Prophylaxis  :   - SCDs /iv heparin  Lab Results  Component Value Date   PLT 136 (L) 05/01/2020    Inpatient Medications  Scheduled Meds: . aspirin  81 mg Oral Q breakfast  . atorvastatin  40 mg Oral q1800  . Chlorhexidine Gluconate Cloth  6 each Topical Daily  . insulin aspart  0-5 Units Subcutaneous QHS  . insulin aspart  0-9 Units Subcutaneous TID WC  . metoprolol tartrate  12.5 mg Oral BID  . pantoprazole  40 mg Oral Daily   Continuous Infusions: . amiodarone 30 mg/hr (05/01/20 1211)   PRN Meds:.acetaminophen **OR** acetaminophen, ondansetron **OR** ondansetron (ZOFRAN) IV    Anti-infectives (From admission, onward)   None        Objective:   Vitals:   05/01/20 1400 05/01/20 1500 05/01/20 1600 05/01/20 1604  BP: 117/81 108/60 (!) 116/56   Pulse: (!) 102 100 (!) 104   Resp: (!) 26 (!) 31 (!) 27   Temp:    97.9 F (36.6 C)  TempSrc:    Axillary  SpO2: 92% 100% 100%   Weight:      Height:        Wt Readings from Last 3 Encounters:  05/01/20 90.5 kg  04/28/20 97.5 kg  04/11/20 97.5 kg     Intake/Output Summary (Last 24 hours) at 05/01/2020 1942 Last data filed at 05/01/2020 1700 Gross per 24 hour  Intake 273.84 ml  Output 300 ml  Net -26.16 ml    Physical Exam  Gen:- Awake Alert, communicating with daughter at bedside HEENT:- [place].AT, No sclera icterus Neck-Supple Neck,No JVD,.  Lungs-diminished in bases, faint rales in bases CV- S1, S2 normal,  regular  Abd-  +ve B.Sounds, Abd Soft, No tenderness,    Extremity/Skin:- No  edema, pedal pulses present  Psych-affect is flat, some confusional episodes  neuro-no new focal deficits, no tremors MSK-left AKA   Data Review:   Micro Results Recent Results (from the past 240 hour(s))  Resp Panel by RT-PCR (Flu A&B, Covid) Nasopharyngeal Swab     Status: None   Collection Time: 04/28/20 10:59 PM   Specimen: Nasopharyngeal Swab; Nasopharyngeal(NP) swabs in vial transport medium  Result Value Ref Range Status   SARS Coronavirus 2 by RT PCR NEGATIVE NEGATIVE Final    Comment: (NOTE) SARS-CoV-2 target nucleic acids are NOT DETECTED.  The SARS-CoV-2 RNA is generally detectable in upper respiratory  specimens during the acute phase of infection. The lowest concentration of SARS-CoV-2 viral copies this assay can detect is 138 copies/mL. A negative result does not preclude SARS-Cov-2 infection and should not be used as the sole basis for treatment or other patient management decisions. A negative result may occur with  improper specimen collection/handling, submission of specimen other than nasopharyngeal swab, presence of viral mutation(s) within the areas targeted by this assay, and inadequate number of viral copies(<138 copies/mL). A negative result Mcdonald be combined with clinical observations, patient history, and epidemiological information. The expected result is Negative.  Fact Sheet for Patients:  BloggerCourse.com  Fact Sheet for Healthcare Providers:  SeriousBroker.it  This test is no t yet approved or cleared by the [place] FDA and  has been authorized for detection and/or diagnosis of SARS-CoV-2 by FDA under an Emergency Use Authorization (EUA). This EUA will remain  in effect (meaning this test can be used) for the duration of the COVID-19 declaration under Section 564(b)(1) of the Act, 21 U.S.C.section 360bbb-3(b)(1),  unless the authorization is terminated  or revoked sooner.       Influenza A by PCR NEGATIVE NEGATIVE Final   Influenza B by PCR NEGATIVE NEGATIVE Final    Comment: (NOTE) The Xpert Xpress SARS-CoV-2/FLU/RSV plus assay is intended as an aid in the diagnosis of influenza from Nasopharyngeal swab specimens and should not be used as a sole basis for treatment. Nasal washings and aspirates are unacceptable for Xpert Xpress SARS-CoV-2/FLU/RSV testing.  Fact Sheet for Patients: BloggerCourse.com  Fact Sheet for Healthcare Providers: SeriousBroker.it  This test is not yet approved or cleared by the [place] FDA and has been authorized for detection and/or diagnosis of SARS-CoV-2 by FDA under an Emergency Use Authorization (EUA). This EUA will remain in effect (meaning this test can be used) for the duration of the COVID-19 declaration under Section 564(b)(1) of the Act, 21 U.S.C. section 360bbb-3(b)(1), unless the authorization is terminated or revoked.  Performed at [facility], [address], [place], [place] [address]   Culture, blood (routine x 2)     Status: None (Preliminary result)   Collection Time: 04/29/20 12:58 AM   Specimen: BLOOD LEFT HAND  Result Value Ref Range Status   Specimen Description BLOOD LEFT HAND  Final   Special Requests   Final    BOTTLES DRAWN AEROBIC ONLY Blood Culture results may not be optimal due to an inadequate volume of blood received in culture bottles   Culture   Final    NO GROWTH 2 DAYS Performed at [facility], [address] [place], [place] [address]    Report Status PENDING  Incomplete  Culture, blood (routine x 2)     Status: None (Preliminary result)   Collection Time: 04/29/20  1:06 AM   Specimen: Left Antecubital; Blood  Result Value Ref Range Status   Specimen Description LEFT ANTECUBITAL  Final   Special Requests   Final    BOTTLES DRAWN AEROBIC ONLY Blood Culture adequate  volume   Culture   Final    NO GROWTH 2 DAYS Performed at [facility], [address] [place], [place] [address]    Report Status PENDING  Incomplete  MRSA PCR Screening     Status: None   Collection Time: 04/30/20  5:07 PM   Specimen: Nasopharyngeal  Result Value Ref Range Status   MRSA by PCR NEGATIVE NEGATIVE Final    Comment:        The GeneXpert MRSA Assay (  FDA approved for NASAL specimens only), is one component of a comprehensive MRSA colonization surveillance program. It is not intended to diagnose MRSA infection nor to guide or monitor treatment for MRSA infections. Performed at [facility], [address], [place], [place] [address]     Radiology Reports CT ABDOMEN PELVIS W CONTRAST  Result Date: 04/28/2020 CLINICAL DATA:  Mid abdominal pain and distension, previous cholecystectomy and appendectomy EXAM: CT ABDOMEN AND PELVIS WITH CONTRAST TECHNIQUE: Multidetector CT imaging of the abdomen and pelvis was performed using the standard protocol following bolus administration of intravenous contrast. CONTRAST:  75mL OMNIPAQUE IOHEXOL 300 MG/ML  SOLN COMPARISON:  02/20/2020 FINDINGS: Lower chest: Slight increase in the small bilateral pleural effusions with associated bibasilar compressive atelectasis. Heart is enlarged. No pericardial effusion. Distal thoracic aorta atherosclerotic. Degenerative changes of the lower thoracic spine. Hepatobiliary: Reflux of contrast into the IVC and hepatic veins suggesting right heart failure. No focal hepatic abnormality or intrahepatic biliary dilatation. Remote cholecystectomy. Pancreas: Unremarkable. No pancreatic ductal dilatation or surrounding inflammatory changes. Spleen: Normal in size without focal abnormality. Adrenals/Urinary Tract: Normal adrenal glands. Bilateral renal cysts again noted. No renal obstruction or hydronephrosis. No hydroureter or ureteral calculus. Bladder is collapsed accounting for wall prominence. Stomach/Bowel:  Similar wall thickening of the duodenum within adjacent duodenal air-fluid level which may represent a diverticulum. Surrounding right upper quadrant strandy edema. Findings remain consistent with duodenitis. Difficult to exclude peptic ulcer disease. No free air. Similar trace strandy edema along the right pericolic gutter and free fluid. Small amount of dependent pelvic free fluid as before. Colon remains moderately air distended but the small bowel is decompressed. This remains nonspecific. Chronic colonic ileus/dysmotility could have this appearance. No focal fluid collection or abscess.  No hemorrhage or hematoma. Vascular/Lymphatic: Aorta atherosclerotic. Negative for aneurysm. Mesenteric and renal vasculature appear to remain patent. No veno-occlusive process. No bulky adenopathy. Reproductive: Marked prostate enlargement. Other: Small fat containing inguinal hernias bilaterally. Intact abdominal wall. No ventral hernia. Musculoskeletal: Bones are osteopenic. Degenerative changes again noted of the spine. No acute compression fracture. IMPRESSION: Similar circumferential duodenal wall thickening in the right upper quadrant with strandy edema and small amount of free fluid along the right pericolic gutter into the pelvis. Again, duodenitis and/or peptic ulcer disease could have this appearance. Adjacent duodenal air-fluid level may represent a duodenal diverticulum. Slight increase in the small pleural effusions and bibasilar compressive atelectasis Cardiomegaly with CT evidence of right heart failure. Nonspecific marked colonic distension without obstruction. See above comment. Aortic Atherosclerosis (ICD10-I70.0). Electronically Signed   By: Judie Petit.  Shick M.D.   On: 04/28/2020 09:36   DG Chest Port 1 View  Result Date: 04/30/2020 CLINICAL DATA:  Weakness EXAM: PORTABLE CHEST 1 VIEW COMPARISON:  02/20/2020 FINDINGS: Cardiac shadow is enlarged but stable. Lungs are well aerated bilaterally. No focal infiltrate  or effusion is seen. No bony abnormality is noted. IMPRESSION: No active disease. Electronically Signed   By: Alcide Clever M.D.   On: 04/30/2020 12:20   DG Abdomen Acute W/Chest  Result Date: 04/28/2020 CLINICAL DATA:  Abdominal pain EXAM: DG ABDOMEN ACUTE WITH 1 VIEW CHEST COMPARISON:  04/28/2020 FINDINGS: Supine and upright frontal views of the abdomen as well as an upright frontal view of the chest are obtained. Cardiac silhouette is stable. No airspace disease. Small bilateral effusions. No pneumothorax. No bowel obstruction or ileus. Continued gaseous distention of the colon. Excreted contrast within the urinary bladder from previous CT. No free gas in the greater peritoneal sac.  No abdominal masses or abnormal calcifications. IMPRESSION: 1. No evidence of bowel obstruction or ileus. 2. Stable gaseous distension of the sigmoid colon. 3. Small bilateral pleural effusions. Electronically Signed   By: Sharlet Salina M.D.   On: 04/28/2020 22:42   DG ABD ACUTE 2+V W 1V CHEST  Result Date: 04/28/2020 CLINICAL DATA:  Abdominal pain EXAM: DG ABDOMEN ACUTE WITH 1 VIEW CHEST COMPARISON:  CT 02/20/2020 FINDINGS: Lungs are clear. No pneumothorax or pleural effusion. Mild cardiomegaly is present. Pulmonary vascularity is normal. Normal abdominal gas pattern with mild gaseous distension of a redundant sigmoid colon, similar to that noted on prior CT examination. No free intraperitoneal gas. Underpenetration precludes evaluation of the visceral shadows. Cholecystectomy clips seen in the right upper quadrant. Phleboliths noted within the pelvis. IMPRESSION: Negative abdominal radiographs.  No acute cardiopulmonary disease. Electronically Signed   By: Helyn Numbers MD   On: 04/28/2020 06:23   ECHOCARDIOGRAM COMPLETE  Result Date: 05/01/2020    ECHOCARDIOGRAM REPORT   Patient Name:   Warren Mcdonald Date of Exam: 05/01/2020 Medical Rec #:  [id number]        Height:       73.0 in Accession #:    [id number]        Weight:       199.5 lb Date of Birth:  Jul 27, 1929       BSA:          2.149 m Patient Age:    89 years         BP:           103/63 mmHg Patient Gender: M                HR:           101 bpm. [facility] Procedure: 2D Echo Indications:    Elevated Troponin  History:        Patient has prior history of Echocardiogram examinations, most                 recent 10/11/2019. Arrythmias:Atrial Fibrillation; Risk                 Factors:Former Smoker and Diabetes.  Sonographer:    Jeryl Columbia RDCS (AE) Referring Phys: [id number] [facility] IMPRESSIONS  1. Severe global hypokinesis. The anteroseptal,anterior, anteroseptal walls and apex are akinetic. Marland Kitchen Left ventricular ejection fraction, by estimation, is 15%. The left ventricle has severely decreased function. The left ventricle demonstrates global hypokinesis. Left ventricular diastolic parameters are indeterminate.  2. Right ventricular systolic function is mildly reduced. The right ventricular size is mildly enlarged.  3. Left atrial size was moderately dilated.  4. Right atrial size was moderately dilated.  5. The mitral valve is normal in structure. Mild mitral valve regurgitation. No evidence of mitral stenosis.  6. Tricuspid valve regurgitation is moderate.  7. The aortic valve is tricuspid. There is mild calcification of the aortic valve. There is mild thickening of the aortic valve. Aortic valve regurgitation is not visualized. No aortic stenosis is present.  8. The inferior vena cava is normal in size with <50% respiratory variability, suggesting right atrial pressure of 8 mmHg. FINDINGS  Left Ventricle: Severe global hypokinesis. The anteroseptal,anterior, anteroseptal walls and apex are akinetic. Left ventricular ejection fraction, by estimation, is 15%. The left ventricle has severely decreased function. The left ventricle demonstrates global hypokinesis. The left ventricular internal cavity size was normal in size. There is no left  ventricular hypertrophy. Left ventricular diastolic parameters are indeterminate. Right Ventricle: The right ventricular size is mildly enlarged. No increase in right ventricular wall thickness. Right ventricular systolic function is mildly reduced. Left Atrium: Left atrial size was moderately dilated. Right Atrium: Right atrial size was moderately dilated. Pericardium: There is no evidence of pericardial effusion. Mitral Valve: The mitral valve is normal in structure. Mild mitral valve regurgitation. No evidence of mitral valve stenosis. Tricuspid Valve: The tricuspid valve is normal in structure. Tricuspid valve regurgitation is moderate . No evidence of tricuspid stenosis. Aortic Valve: The aortic valve is tricuspid. There is mild calcification of the aortic valve. There is mild thickening of the aortic valve. There is mild aortic valve annular calcification. Aortic valve regurgitation is not visualized. No aortic stenosis  is present. Aortic valve mean gradient measures 1.2 mmHg. Aortic valve peak gradient measures 2.3 mmHg. Aortic valve area, by VTI measures 2.07 cm. Pulmonic Valve: The pulmonic valve was not well visualized. Pulmonic valve regurgitation is not visualized. No evidence of pulmonic stenosis. Aorta: The aortic root is normal in size and structure. Pulmonary Artery: Moderate pulmonary HTN, PASP is 45 mmHg. Venous: The inferior vena cava is normal in size with less than 50% respiratory variability, suggesting right atrial pressure of 8 mmHg. IAS/Shunts: The interatrial septum was not well visualized.  LEFT VENTRICLE PLAX 2D LVIDd:         5.33 cm LVIDs:         4.94 cm LV PW:         1.09 cm LV IVS:        0.96 cm LVOT diam:     2.10 cm LV SV:         20 LV SV Index:   9 LVOT Area:     3.46 cm  RIGHT VENTRICLE RV S prime:     7.80 cm/s TAPSE (M-mode): 1.2 cm LEFT ATRIUM              Index       RIGHT ATRIUM           Index LA diam:        4.90 cm  2.28 cm/m  RA Area:     18.80 cm LA Vol (A2C):    123.0 ml 57.22 ml/m RA Volume:   55.90 ml  26.01 ml/m LA Vol (A4C):   76.5 ml  35.59 ml/m LA Biplane Vol: 97.9 ml  45.55 ml/m  AORTIC VALVE AV Area (Vmax):    1.70 cm AV Area (Vmean):   1.63 cm AV Area (VTI):     2.07 cm AV Vmax:           76.60 cm/s AV Vmean:          49.811 cm/s AV VTI:            0.096 m AV Peak Grad:      2.3 mmHg AV Mean Grad:      1.2 mmHg LVOT Vmax:         37.70 cm/s LVOT Vmean:        23.431 cm/s LVOT VTI:          0.058 m LVOT/AV VTI ratio: 0.60  AORTA Ao Root diam: 3.30 cm MITRAL VALVE                TRICUSPID VALVE MV Area (PHT): 4.10 cm     TR Peak grad:   38.2 mmHg MV Decel Time: 185 msec  TR Vmax:        309.00 cm/s MR Peak grad: 76.0 mmHg MR Mean grad: 42.0 mmHg     SHUNTS MR Vmax:      436.00 cm/s   Systemic VTI:  0.06 m MR Vmean:     298.0 cm/s    Systemic Diam: 2.10 cm MV E velocity: 107.00 cm/s MV A velocity: 92.50 cm/s MV E/A ratio:  1.16 Dina Rich MD Electronically signed by Dina Rich MD Signature Date/Time: 05/01/2020/4:25:50 PM    Final    US Abdomen Limited RUQ (LIVER/GB)  Result Date: 05/01/2020 CLINICAL DATA:  Elevated liver function tests for 1 week EXAM: ULTRASOUND ABDOMEN LIMITED RIGHT UPPER QUADRANT COMPARISON:  CT of the abdomen and pelvis of April 28, 2020 FINDINGS: Gallbladder: Post cholecystectomy Common bile duct: Diameter: 2.9 mm Liver: Mildly echogenic liver, mildly coarsened echotexture with suggestion of contour nodularity. No visible lesion on submitted images. Portal vein is patent on color Doppler imaging with normal direction of blood flow towards the liver. Other: RIGHT pleural effusion incidentally imaged. IMPRESSION: Possible hepatic steatosis and early liver disease based on hepatic morphology and echotexture. Post cholecystectomy without biliary duct dilation. RIGHT pleural effusion, partially visualized. Electronically Signed   By: Donzetta Kohut M.D.   On: 05/01/2020 09:30     CBC Recent Labs  Lab 04/28/20 0441  04/28/20 2303 04/30/20 1136 05/01/20 0428  WBC 6.1 8.2 5.7 6.7  HGB 12.0* 11.7* 10.6* 10.9*  HCT 38.0* 37.1* 33.1* 34.5*  PLT 232 204 147* 136*  MCV 95.0 96.6 94.0 95.0  MCH 30.0 30.5 30.1 30.0  MCHC 31.6 31.5 32.0 31.6  RDW 14.8 15.3 15.6* 15.6*  LYMPHSABS 1.2 1.3 1.0  --   MONOABS 0.3 0.6 0.5  --   EOSABS 0.0 0.0 0.0  --   BASOSABS 0.0 0.0 0.0  --     Chemistries  Recent Labs  Lab 04/28/20 0441 04/28/20 2303 04/29/20 0703 04/30/20 1136 05/01/20 0428  NA 132* 129* 133* 134* 134*  K 5.2* 6.6* 5.3* 3.7 3.7  CL 100 102 104 104 106  CO2 19* 13* 14* 17* 17*  GLUCOSE 185* 206* 155* 213* 172*  BUN 40* 54* 57* 56* 58*  CREATININE 1.78* 2.58* 2.56* 2.21* 2.22*  CALCIUM 9.5 8.8* 8.8* 8.8* 8.4*  MG  --   --   --   --  1.8  AST 55* 672*  --  664* 448*  ALT 36 426*  --  660* 605*  ALKPHOS 93 131*  --  113 105  BILITOT 2.0* 2.3*  --  2.0* 1.9*   ------------------------------------------------------------------------------------------------------------------ No results for input(s): CHOL, HDL, LDLCALC, TRIG, CHOLHDL, LDLDIRECT in the last 72 hours.  Lab Results  Component Value Date   HGBA1C 7.8 (H) 04/30/2020   ------------------------------------------------------------------------------------------------------------------ No results for input(s): TSH, T4TOTAL, T3FREE, THYROIDAB in the last 72 hours.  Invalid input(s): FREET3 ------------------------------------------------------------------------------------------------------------------ Recent Labs    04/30/20 2034 04/30/20 2035  VITAMINB12 1,646*  --   FOLATE  --  11.6  FERRITIN 573*  --   TIBC 290  --   IRON 38*  --   RETICCTPCT  --  2.0    Coagulation profile No results for input(s): INR, PROTIME in the last 168 hours.  No results for input(s): DDIMER in the last 72 hours.  Cardiac Enzymes No results for input(s): CKMB, TROPONINI, MYOGLOBIN in the last 168 hours.  Invalid input(s):  CK ------------------------------------------------------------------------------------------------------------------    Component Value Date/Time   BNP 2,416.0 (H)  04/30/2020 2034     Shon Hale M.D on 05/01/2020 at 7:42 PM  Go to [email or web address] - for contact info  [facility] - Office  [phone]

## 2020-05-01 NOTE — Progress Notes (Signed)
*  PRELIMINARY RESULTS* Echocardiogram 2D Echocardiogram has been performed.  Jeryl Columbia 05/01/2020, 2:24 PM

## 2020-05-01 NOTE — Progress Notes (Signed)
ANTICOAGULATION CONSULT NOTE - Initial Consult  Pharmacy Consult for heparin Indication: chest pain/ACS/afib  No Known Allergies  Patient Measurements: Height: 6\' 1"  (185.4 cm) Weight: 90.5 kg (199 lb 8.3 oz) IBW/kg (Calculated) : 79.9 Heparin Dosing Weight: 83 kg  Vital Signs: Temp: 97.9 F (36.6 C) (12/06 1604) Temp Source: Axillary (12/06 1604) BP: 116/56 (12/06 1600) Pulse Rate: 104 (12/06 1600)  Labs: Recent Labs    04/28/20 2303 04/28/20 2303 04/29/20 0703 04/29/20 0703 04/29/20 1102 04/30/20 1136 04/30/20 1311 04/30/20 2034 05/01/20 0428  HGB 11.7*   < >  --   --   --  10.6*  --   --  10.9*  HCT 37.1*  --   --   --   --  33.1*  --   --  34.5*  PLT 204  --   --   --   --  147*  --   --  136*  CREATININE 2.58*   < > 2.56*  --   --  2.21*  --   --  2.22*  TROPONINIHS  --   --  939*   < > 838*  --  579* 913*  --    < > = values in this interval not displayed.    Estimated Creatinine Clearance: 26 mL/min (A) (by C-G formula based on SCr of 2.22 mg/dL (H)).   Medical History: Past Medical History:  Diagnosis Date  . Abdominal distension (gaseous)   . Anemia, unspecified   . Chronic diastolic (congestive) heart failure (HCC)   . Essential (primary) hypertension   . Hyperlipidemia   . PAF (paroxysmal atrial fibrillation) (HCC)   . Peripheral vascular disease (HCC) 2011   Left above-knee popliteal to posterior tibial artery bypass   . PUD (peptic ulcer disease)   . Type 2 diabetes mellitus with diabetic peripheral angiopathy without gangrene (HCC)     Medications:  Medications Prior to Admission  Medication Sig Dispense Refill Last Dose  . apixaban (ELIQUIS) 2.5 MG TABS tablet Take 1 tablet (2.5 mg total) by mouth 2 (two) times daily. 60 tablet 6 04/27/2020 at 1700  . atorvastatin (LIPITOR) 20 MG tablet Take 1 tablet (20 mg total) by mouth daily at 6 PM. 30 tablet 6 04/27/2020  . pantoprazole (PROTONIX) 40 MG tablet Take 1 tablet (40 mg total) by mouth  daily. 30 tablet 1 04/27/2020   Scheduled:  . aspirin  81 mg Oral Q breakfast  . atorvastatin  40 mg Oral q1800  . Chlorhexidine Gluconate Cloth  6 each Topical Daily  . insulin aspart  0-5 Units Subcutaneous QHS  . insulin aspart  0-9 Units Subcutaneous TID WC  . metoprolol tartrate  12.5 mg Oral BID  . pantoprazole  40 mg Oral Daily   Infusions:  . amiodarone 30 mg/hr (05/01/20 1211)    Assessment: Pt was admitted for abd pain. He has been on apixaban for his afib with the last dose taken this AM. Apixaban will be transition to IV heparin for NSTEMI. Cardiac workup pending for any potential procedures.   Scr 2.22 Hgb 10.9 Plt 136  Goal of Therapy:  Heparin level 0.3-0.7 units/ml Monitor platelets by anticoagulation protocol: Yes   Plan:  Baseline heparin level and PTT Dc apixaban Heparin at 1200 units/hr 8 hr PTT  14/06/21, PharmD, BCIDP, AAHIVP, CPP Infectious Disease Pharmacist 05/01/2020 7:56 PM

## 2020-05-01 NOTE — TOC Initial Note (Signed)
Transition of Care [facility]) - Initial/Assessment Note    Patient Details  Name: Warren Mcdonald MRN: [id number] Date of Birth: 12/15/1929  [facility] CM/SW Contact:    Elliot Gault, LCSW Phone Number: 05/01/2020, 3:09 PM  Clinical Narrative:                  Pt admitted from home. Pt has a high readmission risk score. Pt sleeping at the time of TOC attempted visit today. Spoke with pt's granddaughter, [name], by phone to complete assessment. Per [name], pt resides with his wife in their home. Pt's wife assists as needed with ADL's. Pt's wife does periodically still work and when she is out of the home, she arranges for one of pt's children or grandchildren to stay with patient.   Per [place], pt is wheelchair bound for ambulation. He has an AKA. He has a raised toilet seat as his only other DME. Pt's family members assist with pt getting to appointments and obtaining medications. [name] reports that pt is active with [facility] RN.  This LCSW spoke with Cala Bradford at [facility] to update. Per Cala Bradford, they just received the referral on patient and haven't see him yet. They will need new orders at dc.  Reviewed pt's record and noted that pt is active with [facility] CM but that the RN CM has been unsuccessful in reaching pt or his wife in the recent weeks. The notes indicate that voice messages have been left requesting return call but no return call has been received.  TOC will follow and assist as needed.  Expected Discharge Plan: Home w Home Health Services Barriers to Discharge: Continued Medical Work up   Patient Goals and CMS Choice        Expected Discharge Plan and Services Expected Discharge Plan: Home w Home Health Services In-house Referral: Clinical Social Work   Post Acute Care Choice: Resumption of Svcs/PTA Provider Living arrangements for the past 2 months: Single Family Home                                      Prior Living  Arrangements/Services Living arrangements for the past 2 months: Single Family Home Lives with:: Spouse Patient language and need for interpreter reviewed:: Yes Do you feel safe going back to the place where you live?: Yes      Need for Family Participation in Patient Care: Yes (Comment) Care giver support system in place?: Yes (comment) Current home services: Home RN Criminal Activity/Legal Involvement Pertinent to Current Situation/Hospitalization: No - Comment as needed  Activities of Daily Living Home Assistive Devices/Equipment: CBG Meter, Wheelchair ADL Screening (condition at time of admission) Patient's cognitive ability adequate to safely complete daily activities?: Yes Is the patient deaf or have difficulty hearing?: No Does the patient have difficulty seeing, even when wearing glasses/contacts?: No Does the patient have difficulty concentrating, remembering, or making decisions?: No Patient able to express need for assistance with ADLs?: Yes Does the patient have difficulty dressing or bathing?: Yes Independently performs ADLs?: No Communication: Independent Dressing (OT): Needs assistance Is this a change from baseline?: Pre-admission baseline Grooming: Independent Feeding: Independent Bathing: Independent Toileting: Independent with device (comment) In/Out Bed: Independent Walks in Home: Independent with device (comment) Does the patient have difficulty walking or climbing stairs?: Yes Weakness of Legs: None Weakness of Arms/Hands: None  Permission Sought/Granted  Emotional Assessment Appearance:: Appears younger than stated age     Orientation: : Oriented to Self Alcohol / Substance Use: Not Applicable Psych Involvement: No (comment)  Admission diagnosis:  Hyperkalemia [E87.5] Generalized abdominal pain [R10.84] Transaminitis [R74.01] AKI (acute kidney injury) (HCC) [N17.9] Atrial fibrillation with RVR (HCC) [I48.91] Hypotension  [I95.9] Duodenitis [K29.80] Hypotension, unspecified hypotension type [I95.9] Patient Active Problem List   Diagnosis Date Noted  . Atrial fibrillation with RVR (HCC) 04/30/2020  . Hypotension 04/29/2020  . Encounter to establish care 03/07/2020  . Constipation 03/07/2020  . CKD (chronic kidney disease) stage 3, GFR 30-59 ml/min (HCC) 03/07/2020  . [name] (heart failure with reduced ejection fraction) (HCC) 02/20/2020  . S/P AKA (above knee amputation) (HCC) 08/09/2019  . PAF (paroxysmal atrial fibrillation) (HCC)   . Nonhealing surgical wound 06/25/2019  . Essential (primary) hypertension   . Hyperlipidemia, unspecified   . Type 2 diabetes mellitus with diabetic peripheral angiopathy without gangrene (HCC)   . Chronic combined systolic and diastolic congestive heart failure (HCC)   . Acute diastolic heart failure (HCC) 09/12/2014  . Cholecystitis, acute 09/06/2014  . Atrial fibrillation with rapid ventricular response (HCC) 09/06/2014  . 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 Abdominal pain 09/02/2014  . DM (diabetes mellitus) (HCC) 09/02/2014  . Pain in limb 12/27/2013  . Aftercare following surgery of the circulatory system, NEC 06/28/2013  . Atherosclerosis of native artery of extremity with intermittent claudication (HCC) 06/28/2013  . Peripheral vascular disease (HCC) 12/28/2012  . PAD (peripheral artery disease) (HCC) 12/23/2011   PCP:  Anabel Halon, MD Pharmacy:   [facility] [phone] - [place], [place] - [address] [place] [address] Phone: [phone] Fax: [phone]     Social Determinants of Health (SDOH) Interventions    Readmission Risk Interventions Readmission Risk Prevention Plan 05/01/2020  Transportation Screening Complete  HRI or Home Care Consult Complete  Social Work Consult for Recovery Care Planning/Counseling Complete  Palliative Care Screening Not Applicable  Medication Review  Oceanographer) Complete  Some recent data might be hidden

## 2020-05-01 NOTE — Progress Notes (Signed)
CRITICAL VALUE ALERT  Critical Value:  Lactic Acid- 3.0  Date & Time Notied:  05/01/2020 1121  Provider Notified: Shon Hale  Orders Received/Actions taken: N/A

## 2020-05-02 ENCOUNTER — Inpatient Hospital Stay (HOSPITAL_COMMUNITY): Payer: Medicare Other

## 2020-05-02 LAB — TROPONIN I (HIGH SENSITIVITY)
Troponin I (High Sensitivity): 477 ng/L (ref <18)
Troponin I (High Sensitivity): 464 ng/L (ref <18)

## 2020-05-02 LAB — COMPREHENSIVE METABOLIC PANEL
ALT: 629 U/L — ABNORMAL HIGH (ref 0–44)
AST: 432 U/L — ABNORMAL HIGH (ref 15–41)
Albumin: 3.6 g/dL (ref 3.5–5.0)
Alkaline Phosphatase: 114 U/L (ref 38–126)
Anion gap: 13 (ref 5–15)
BUN: 60 mg/dL — ABNORMAL HIGH (ref 8–23)
CO2: 17 mmol/L — ABNORMAL LOW (ref 22–32)
Calcium: 8.7 mg/dL — ABNORMAL LOW (ref 8.9–10.3)
Chloride: 105 mmol/L (ref 98–111)
Creatinine, Ser: 2.32 mg/dL — ABNORMAL HIGH (ref 0.61–1.24)
GFR, Estimated: 26 mL/min — ABNORMAL LOW (ref >60)
Glucose, Bld: 139 mg/dL — ABNORMAL HIGH (ref 70–99)
Potassium: 4 mmol/L (ref 3.5–5.1)
Sodium: 135 mmol/L (ref 135–145)
Total Bilirubin: 1.8 mg/dL — ABNORMAL HIGH (ref 0.3–1.2)
Total Protein: 6.9 g/dL (ref 6.5–8.1)

## 2020-05-02 LAB — BRAIN NATRIURETIC PEPTIDE: B Natriuretic Peptide: 2068 pg/mL — ABNORMAL HIGH (ref 0.0–100.0)

## 2020-05-02 LAB — CBC
HCT: 35.8 % — ABNORMAL LOW (ref 39.0–52.0)
Hemoglobin: 11.3 g/dL — ABNORMAL LOW (ref 13.0–17.0)
MCH: 30.1 pg (ref 26.0–34.0)
MCHC: 31.6 g/dL (ref 30.0–36.0)
MCV: 95.2 fL (ref 80.0–100.0)
Platelets: 143 10*3/uL — ABNORMAL LOW (ref 150–400)
RBC: 3.76 MIL/uL — ABNORMAL LOW (ref 4.22–5.81)
RDW: 15.8 % — ABNORMAL HIGH (ref 11.5–15.5)
WBC: 6.7 10*3/uL (ref 4.0–10.5)
nRBC: 0.4 % — ABNORMAL HIGH (ref 0.0–0.2)

## 2020-05-02 LAB — HEPARIN LEVEL (UNFRACTIONATED): Heparin Unfractionated: >2.2 IU/mL — ABNORMAL HIGH (ref 0.30–0.70)

## 2020-05-02 LAB — GLUCOSE, CAPILLARY
Glucose-Capillary: 128 mg/dL — ABNORMAL HIGH (ref 70–99)
Glucose-Capillary: 139 mg/dL — ABNORMAL HIGH (ref 70–99)
Glucose-Capillary: 110 mg/dL — ABNORMAL HIGH (ref 70–99)
Glucose-Capillary: 152 mg/dL — ABNORMAL HIGH (ref 70–99)

## 2020-05-02 LAB — APTT
aPTT: >200 seconds (ref 24–36)
aPTT: >200 seconds (ref 24–36)

## 2020-05-02 MED ORDER — HEPARIN (PORCINE) 25000 UT/250ML-% IV SOLN
900.0000 [IU]/h | INTRAVENOUS | Status: DC
  Administered 2020-05-02: 900 [IU]/h via INTRAVENOUS

## 2020-05-02 MED ORDER — ENOXAPARIN SODIUM 100 MG/ML ~~LOC~~ SOLN
90.0000 mg | SUBCUTANEOUS | Status: DC
  Administered 2020-05-02: 90 mg via SUBCUTANEOUS
  Filled 2020-05-02: qty 1

## 2020-05-02 MED ORDER — ALPRAZOLAM 0.5 MG PO TABS
0.5000 mg | ORAL_TABLET | Freq: Two times a day (BID) | ORAL | Status: DC | PRN
  Administered 2020-05-02 – 2020-05-03 (×2): 0.5 mg via ORAL
  Filled 2020-05-02 (×2): qty 1

## 2020-05-02 MED ORDER — SENNOSIDES-DOCUSATE SODIUM 8.6-50 MG PO TABS
2.0000 | ORAL_TABLET | Freq: Two times a day (BID) | ORAL | Status: DC
  Administered 2020-05-02 – 2020-05-04 (×5): 2 via ORAL
  Filled 2020-05-02 (×5): qty 2

## 2020-05-02 MED ORDER — BISACODYL 10 MG RE SUPP
10.0000 mg | Freq: Once | RECTAL | Status: AC
  Administered 2020-05-02: 10 mg via RECTAL
  Filled 2020-05-02: qty 1

## 2020-05-02 MED ORDER — SODIUM CHLORIDE 0.9 % IV SOLN: INTRAVENOUS | Status: DC

## 2020-05-02 NOTE — Progress Notes (Signed)
6:50am RN called due to patient's aPTT > 200.  He states he was going to talk to pharmacy regarding this. Patient was asymptomatic and stable.

## 2020-05-02 NOTE — Progress Notes (Signed)
ANTICOAGULATION CONSULT NOTE   Pharmacy Consult for Heparin>>lovenox Indication: chest pain/ACS/afib  No Known Allergies  Patient Measurements: Height: 6\' 1"  (185.4 cm) Weight: 90.5 kg (199 lb 8.3 oz) IBW/kg (Calculated) : 79.9 Heparin Dosing Weight: 83 kg  Vital Signs: Temp: 97.1 F (36.2 C) (12/07 1650) Temp Source: Axillary (12/07 1650)  Labs: Recent Labs    04/30/20 1136 04/30/20 1136 04/30/20 1311 04/30/20 2034 05/01/20 0428 05/01/20 2232 05/02/20 0455 05/02/20 1028  HGB 10.6*   < >  --   --  10.9*  --  11.3*  --   HCT 33.1*  --   --   --  34.5*  --  35.8*  --   PLT 147*  --   --   --  136*  --  143*  --   APTT  --   --   --   --   --  98* >200*  --   HEPARINUNFRC  --   --   --   --   --  2.00*  --   --   CREATININE 2.21*  --   --   --  2.22*  --  2.32*  --   TROPONINIHS  --   --    < > 913*  --   --  477* 464*   < > = values in this interval not displayed.    Estimated Creatinine Clearance: 24.9 mL/min (A) (by C-G formula based on SCr of 2.32 mg/dL (H)).   Medical History: Past Medical History:  Diagnosis Date  . Abdominal distension (gaseous)   . Anemia, unspecified   . Chronic diastolic (congestive) heart failure (HCC)   . Essential (primary) hypertension   . Hyperlipidemia   . PAF (paroxysmal atrial fibrillation) (HCC)   . Peripheral vascular disease (HCC) 2011   Left above-knee popliteal to posterior tibial artery bypass   . PUD (peptic ulcer disease)   . Type 2 diabetes mellitus with diabetic peripheral angiopathy without gangrene (HCC)     Medications:  Medications Prior to Admission  Medication Sig Dispense Refill Last Dose  . apixaban (ELIQUIS) 2.5 MG TABS tablet Take 1 tablet (2.5 mg total) by mouth 2 (two) times daily. 60 tablet 6 04/27/2020 at 1700  . atorvastatin (LIPITOR) 20 MG tablet Take 1 tablet (20 mg total) by mouth daily at 6 PM. 30 tablet 6 04/27/2020  . furosemide (LASIX) 40 MG tablet Take 40 mg by mouth daily.     . pantoprazole  (PROTONIX) 40 MG tablet Take 1 tablet (40 mg total) by mouth daily. 30 tablet 1 04/27/2020   Scheduled:  . aspirin  81 mg Oral Q breakfast  . Chlorhexidine Gluconate Cloth  6 each Topical Daily  . enoxaparin (LOVENOX) injection  90 mg Subcutaneous Q24H  . insulin aspart  0-5 Units Subcutaneous QHS  . insulin aspart  0-9 Units Subcutaneous TID WC  . pantoprazole  40 mg Oral Daily  . senna-docusate  2 tablet Oral BID   Infusions:  . amiodarone 30 mg/hr (05/02/20 1225)    Assessment: Pt was admitted for abd pain. He has been on apixaban for his afib with the last dose taken this AM. Apixaban will be transition to IV heparin for NSTEMI. Cardiac workup pending for any potential procedures.   Phlebotomy is having a really hard time getting blood draw. D/w 14/07/21, we will transition him from IV heparin to SQ lovenox since not cardiac procedure is anticipated.  CrCl<30 ml/min  Goal of  Therapy:  Anti-Xa 0.6-1 Monitor platelets by anticoagulation protocol: Yes   Plan:  Dc heparin Lovenox 90mg  SQ q24 F/u with CBC  , PharmD, BCIDP, AAHIVP, CPP Infectious Disease Pharmacist 05/02/2020 7:40 PM

## 2020-05-02 NOTE — Progress Notes (Signed)
Patient Demographics:    Warren Mcdonald, is a 86 y.o. male, DOB - [birth year], KDX:[id number]  Admit date - 04/28/2020   Admitting Physician Warren Hoover Brunette, DO  Outpatient Primary MD for the patient is Warren Halon, MD  LOS - 3   Chief Complaint  Patient presents with  . Abdominal Pain        Subjective:    Warren Mcdonald today has no fevers, no emesis,  No chest pain,  -Wife at bedside, questions answered -Patient is able to tolerate some oral intake -Shortness of breath is not worse  Assessment  & Plan :    Principal Problem:   NSTEMI (non-ST elevated myocardial infarction)--with cardiogenic shock Active Problems:   Acute on chronic [name] (heart failure with reduced ejection fraction) --worse due to cardiogenic shock in the setting of NSTEMI    DM (diabetes mellitus) (HCC)   Atrial fibrillation with rapid ventricular response (HCC)   Chronic combined systolic and diastolic congestive heart failure (HCC)   CKD (chronic kidney disease) stage 3, GFR 30-59 ml/min (HCC)   Hypotension   Atrial fibrillation with RVR (HCC)  Brief Summary:- [name] with history of [name], AF , HTN, PAD and s/p Lt AKA, HLD, DM2, CKD IIIb,  with suspected decompensated right and left heart failure and AKI admitted on 04/30/2020 with persistent hypotension and found to have NSTEMI with cardiogenic shock and persistent hypotension as well as A. fib with RVR and CHF exacerbation with significant drop in EF from 40-45 % back in May 2021 down to 15 % this admission  A/p 1)[name]--- acute on chronic systolic CHF exacerbation due to NSTEMI with cardiogenic shock -- Echo from 05/01/2020 with EF of 15%, with global hypokinesis -Echo from 10/11/2019 had patient's EF at 40 to 45% -BNP  1,811 >>  2416 >>2,068 -Troponin peaked at 939,  it is now down to 464 -Hypotension has resolved --patient has been taken off dobutamine  -Cardiology  input appreciated, as per cardiology team Pt  would Not be a good candidate for aggressive advanced heart failure techniques at age 69.  Inotropic support if initiated by advanced heart failure team , would need to be considered a temporizing measure. -Continue IV heparin and aspirin  -Lipitor on hold due to elevated LFTs avoid ACEI/ARB/ARNI due to renal concerns -Soft blood pressure precludes IV Lasix use at this time  2 A. 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/2019, continue aspirin IV heparin as ordered -Lipitor on hold due to elevated LFTs  5)DM2-A1c 7.8 reflecting uncontrolled DM with hyperglycemia PTA  -use Novolog/Humalog Sliding scale insulin with Accu-Cheks/Fingersticks as ordered   6) hypotension and lactic acidosis most likely due to cardiogenic shock above Rather than  frank sepsis  7) elevated LFTs---  due to persistent hypotension/shock liver, abdominal/liver ultrasound with possible hepatic steatosis and elevated bili  8) elevated troponin--suspect NSTEMI with cardiogenic shock--- Echo with significant drop in EF and global hypokinesis,   -As per cardiology service not a candidate for invasive ischemia work-up at this time given due to kidney concerns --IV heparin, and aspirin ordered -No statin due to LFT elevation  9) chronic iron deficiency anemia--- work-up reveals iron deficiency, folate and B12 are WNL- -hemoglobin stable around 11.3 -Not a candidate for  endoluminal evaluation due to hemodynamic instability  10)Social/Ethics--plan of care discussed with patient and family currently full code, official palliative care consult pending to help further delineate goals of care and further address advanced directives  Disposition/Need for in-Hospital Stay- patient unable to be discharged at this time due to -NSTEMI with cardiogenic shock, persistent hypotension, A. fib with RVR, CHF exacerbation secondary to NSTEMI requiring further cardiology evaluation and the amiodarone and  IV heparin  Status is: Inpatient  Remains inpatient appropriate because:see above   Disposition: The patient is from: Home              Anticipated d/c is to: TBD after PT eval when more medically stable              Anticipated d/c date is: > 3 days              Patient currently is not medically stable to d/c. Barriers: Not Clinically Stable-   Code Status :  -  Code Status: Full Code   Family Communication:    NA (patient is alert, awake and coherent)  Discussed with wife and daughter  Consults  :  Cardiology/palliative  DVT Prophylaxis  :   - SCDs /iv heparin  Lab Results  Component Value Date   PLT 143 (L) 05/02/2020    Inpatient Medications  Scheduled Meds: . aspirin  81 mg Oral Q breakfast  . Chlorhexidine Gluconate Cloth  6 each Topical Daily  . insulin aspart  0-5 Units Subcutaneous QHS  . insulin aspart  0-9 Units Subcutaneous TID WC  . pantoprazole  40 mg Oral Daily  . senna-docusate  2 tablet Oral BID   Continuous Infusions: . amiodarone 30 mg/hr (05/02/20 1225)  . heparin 900 Units/hr (05/02/20 0915)   PRN Meds:.acetaminophen **OR** acetaminophen, ALPRAZolam, ondansetron **OR** ondansetron (ZOFRAN) IV    Anti-infectives (From admission, onward)   None        Objective:   Vitals:   05/02/20 0700 05/02/20 0733 05/02/20 1131 05/02/20 1650  BP: 101/73     Pulse: 84     Resp: 19     Temp:  (!) 97.1 F (36.2 C) (!) 97.1 F (36.2  C) (!) 97.1 F (36.2 C)  TempSrc:  Axillary Axillary Axillary  SpO2: 100%     Weight:      Height:        Wt Readings from Last 3 Encounters:  05/01/20 90.5 kg  04/28/20 97.5 kg  04/11/20 97.5 kg     Intake/Output Summary (Last 24 hours) at 05/02/2020 1830 Last data filed at 05/02/2020 1653 Gross per 24 hour  Intake 847.08 ml  Output 250 ml  Net 597.08 ml    Physical Exam  Gen:- Awake Alert, in no acute distress HEENT:- [place].AT, No sclera icterus Neck-Supple Neck,No JVD,.  Lungs-diminished in bases, faint rales in bases CV- S1, S2 normal, irregular  Abd-  +ve B.Sounds, Abd Soft, No tenderness,    Extremity/Skin:- No  edema,  pedal pulses present  Psych-affect is flat, some confusional episodes  neuro-no new focal deficits, no tremors MSK-left AKA   Data Review:   Micro Results Recent Results (from the past 240 hour(s))  Resp Panel by RT-PCR (Flu A&B, Covid) Nasopharyngeal Swab     Status: None   Collection Time: 04/28/20 10:59 PM   Specimen: Nasopharyngeal Swab; Nasopharyngeal(NP) swabs in vial transport medium  Result Value Ref Range Status   SARS Coronavirus 2 by RT PCR NEGATIVE NEGATIVE Final    Comment: (NOTE) SARS-CoV-2 target nucleic acids are NOT DETECTED.  The SARS-CoV-2 RNA is generally detectable in upper respiratory specimens during the acute phase of infection. The lowest concentration of SARS-CoV-2 viral copies this assay can detect is 138 copies/mL. A negative result does not preclude SARS-Cov-2 infection and should not be used as the sole basis for treatment or other patient management decisions. A negative result may occur with  improper specimen collection/handling, submission of specimen other than nasopharyngeal swab, presence of viral mutation(s) within the areas targeted by this assay, and inadequate number of viral copies(<138 copies/mL). A negative result Mcdonald be combined with clinical observations, patient history, and  epidemiological information. The expected result is Negative.  Fact Sheet for Patients:  BloggerCourse.com  Fact Sheet for Healthcare Providers:  SeriousBroker.it  This test is no t yet approved or cleared by the [place] FDA and  has been authorized for detection and/or diagnosis of SARS-CoV-2 by FDA under an Emergency Use Authorization (EUA). This EUA will remain  in effect (meaning this test can be used) for the duration of the COVID-19 declaration under Section 564(b)(1) of the Act, 21 U.S.C.section 360bbb-3(b)(1), unless the authorization is terminated  or revoked sooner.       Influenza A by PCR NEGATIVE NEGATIVE Final   Influenza B by PCR NEGATIVE NEGATIVE Final    Comment: (NOTE) The Xpert Xpress SARS-CoV-2/FLU/RSV plus assay is intended as an aid in the diagnosis of influenza from Nasopharyngeal swab specimens and should not be used as a sole basis for treatment. Nasal washings and aspirates are unacceptable for Xpert Xpress SARS-CoV-2/FLU/RSV testing.  Fact Sheet for Patients: BloggerCourse.com  Fact Sheet for Healthcare Providers: SeriousBroker.it  This test is not yet approved or cleared by the [place] FDA and has been authorized for detection and/or diagnosis of SARS-CoV-2 by FDA under an Emergency Use Authorization (EUA). This EUA will remain in effect (meaning this test can be used) for the duration of the COVID-19 declaration under Section 564(b)(1) of the Act, 21 U.S.C. section 360bbb-3(b)(1), unless the authorization is terminated or revoked.  Performed at [facility], [address] [place], [place] [address]   Culture, blood (routine x 2)     Status: None (Preliminary result)   Collection Time: 04/29/20 12:58 AM   Specimen: BLOOD LEFT HAND  Result Value Ref Range Status   Specimen Description BLOOD LEFT HAND  Final   Special Requests    Final    BOTTLES DRAWN AEROBIC ONLY Blood Culture results may not be optimal due to an inadequate volume of blood received in culture bottles   Culture   Final    NO GROWTH 2 DAYS Performed at [facility], [address], [place], [place] [address]    Report Status PENDING  Incomplete  Culture, blood (routine x 2)     Status: None (Preliminary result)   Collection Time: 04/29/20  1:06 AM   Specimen: Left Antecubital; Blood  Result Value Ref Range Status  Specimen Description LEFT ANTECUBITAL  Final   Special Requests   Final    BOTTLES DRAWN AEROBIC ONLY Blood Culture adequate volume   Culture   Final    NO GROWTH 2 DAYS Performed at [facility], [address], [place], [place] [address]    Report Status PENDING  Incomplete  MRSA PCR Screening     Status: None   Collection Time: 04/30/20  5:07 PM   Specimen: Nasopharyngeal  Result Value Ref Range Status   MRSA by PCR NEGATIVE NEGATIVE Final    Comment:        The GeneXpert MRSA Assay (FDA approved for NASAL specimens only), is one component of a comprehensive MRSA colonization surveillance program. It is not intended to diagnose MRSA infection nor to guide or monitor treatment for MRSA infections. Performed at [facility], [address], [place], [place] [address]     Radiology Reports CT ABDOMEN PELVIS W CONTRAST  Result Date: 04/28/2020 CLINICAL DATA:  Mid abdominal pain and distension, previous cholecystectomy and appendectomy EXAM: CT ABDOMEN AND PELVIS WITH CONTRAST TECHNIQUE: Multidetector CT imaging of the abdomen and pelvis was performed using the standard protocol following bolus administration of intravenous contrast. CONTRAST:  77mL OMNIPAQUE IOHEXOL 300 MG/ML  SOLN COMPARISON:  02/20/2020 FINDINGS: Lower chest: Slight increase in the small bilateral pleural effusions with associated bibasilar compressive atelectasis. Heart is enlarged. No pericardial effusion. Distal thoracic aorta atherosclerotic. Degenerative  changes of the lower thoracic spine. Hepatobiliary: Reflux of contrast into the IVC and hepatic veins suggesting right heart failure. No focal hepatic abnormality or intrahepatic biliary dilatation. Remote cholecystectomy. Pancreas: Unremarkable. No pancreatic ductal dilatation or surrounding inflammatory changes. Spleen: Normal in size without focal abnormality. Adrenals/Urinary Tract: Normal adrenal glands. Bilateral renal cysts again noted. No renal obstruction or hydronephrosis. No hydroureter or ureteral calculus. Bladder is collapsed accounting for wall prominence. Stomach/Bowel: Similar wall thickening of the duodenum within adjacent duodenal air-fluid level which may represent a diverticulum. Surrounding right upper quadrant strandy edema. Findings remain consistent with duodenitis. Difficult to exclude peptic ulcer disease. No free air. Similar trace strandy edema along the right pericolic gutter and free fluid. Small amount of dependent pelvic free fluid as before. Colon remains moderately air distended but the small bowel is decompressed. This remains nonspecific. Chronic colonic ileus/dysmotility could have this appearance. No focal fluid collection or abscess.  No hemorrhage or hematoma. Vascular/Lymphatic: Aorta atherosclerotic. Negative for aneurysm. Mesenteric and renal vasculature appear to remain patent. No veno-occlusive process. No bulky adenopathy. Reproductive: Marked prostate enlargement. Other: Small fat containing inguinal hernias bilaterally. Intact abdominal wall. No ventral hernia. Musculoskeletal: Bones are osteopenic. Degenerative changes again noted of the spine. No acute compression fracture. IMPRESSION: Similar circumferential duodenal wall thickening in the right upper quadrant with strandy edema and small amount of free fluid along the right pericolic gutter into the pelvis. Again, duodenitis and/or peptic ulcer disease could have this appearance. Adjacent duodenal air-fluid level  may represent a duodenal diverticulum. Slight increase in the small pleural effusions and bibasilar compressive atelectasis Cardiomegaly with CT evidence of right heart failure. Nonspecific marked colonic distension without obstruction. See above comment. Aortic Atherosclerosis (ICD10-I70.0). Electronically Signed   By: Judie Petit.  Shick M.D.   On: 04/28/2020 09:36   DG CHEST PORT 1 VIEW  Result Date: 05/02/2020 CLINICAL DATA:  Shortness of breath. EXAM: PORTABLE CHEST 1 VIEW COMPARISON:  04/30/2020. FINDINGS: Mediastinum and hilar structures normal. Stable cardiomegaly. No pulmonary venous congestion. Mild bibasilar atelectasis/infiltrates. No pleural effusion or pneumothorax.  IMPRESSION: 1. Stable cardiomegaly.  No pulmonary venous congestion. 2. Mild bibasilar atelectasis/infiltrates. Electronically Signed   By: Maisie Fus  Register   On: 05/02/2020 06:58   DG Chest Port 1 View  Result Date: 04/30/2020 CLINICAL DATA:  Weakness EXAM: PORTABLE CHEST 1 VIEW COMPARISON:  02/20/2020 FINDINGS: Cardiac shadow is enlarged but stable. Lungs are well aerated bilaterally. No focal infiltrate or effusion is seen. No bony abnormality is noted. IMPRESSION: No active disease. Electronically Signed   By: Alcide Clever M.D.   On: 04/30/2020 12:20   DG Abdomen Acute W/Chest  Result Date: 04/28/2020 CLINICAL DATA:  Abdominal pain EXAM: DG ABDOMEN ACUTE WITH 1 VIEW CHEST COMPARISON:  04/28/2020 FINDINGS: Supine and upright frontal views of the abdomen as well as an upright frontal view of the chest are obtained. Cardiac silhouette is stable. No airspace disease. Small bilateral effusions. No pneumothorax. No bowel obstruction or ileus. Continued gaseous distention of the colon. Excreted contrast within the urinary bladder from previous CT. No free gas in the greater peritoneal sac. No abdominal masses or abnormal calcifications. IMPRESSION: 1. No evidence of bowel obstruction or ileus. 2. Stable gaseous distension of the sigmoid  colon. 3. Small bilateral pleural effusions. Electronically Signed   By: Sharlet Salina M.D.   On: 04/28/2020 22:42   DG ABD ACUTE 2+V W 1V CHEST  Result Date: 04/28/2020 CLINICAL DATA:  Abdominal pain EXAM: DG ABDOMEN ACUTE WITH 1 VIEW CHEST COMPARISON:  CT 02/20/2020 FINDINGS: Lungs are clear. No pneumothorax or pleural effusion. Mild cardiomegaly is present. Pulmonary vascularity is normal. Normal abdominal gas pattern with mild gaseous distension of a redundant sigmoid colon, similar to that noted on prior CT examination. No free intraperitoneal gas. Underpenetration precludes evaluation of the visceral shadows. Cholecystectomy clips seen in the right upper quadrant. Phleboliths noted within the pelvis. IMPRESSION: Negative abdominal radiographs.  No acute cardiopulmonary disease. Electronically Signed   By: Helyn Numbers MD   On: 04/28/2020 06:23   ECHOCARDIOGRAM COMPLETE  Result Date: 05/01/2020    ECHOCARDIOGRAM REPORT   Patient Name:   VIVIANO BIR Date of Exam: 05/01/2020 Medical Rec #:  [id number]        Height:       73.0 in Accession #:    [id number]       Weight:       199.5 lb Date of Birth:  06-05-1929       BSA:          2.149 m Patient Age:    89 years         BP:           103/63 mmHg Patient Gender: M                HR:           101 bpm. [facility] Procedure: 2D Echo Indications:    Elevated Troponin  History:        Patient has prior history of Echocardiogram examinations, most                 recent 10/11/2019. Arrythmias:Atrial Fibrillation; Risk                 Factors:Former Smoker and Diabetes.  Sonographer:    Jeryl Columbia RDCS (AE) Referring Phys: [phone] [facility] IMPRESSIONS  1. Severe global hypokinesis. The anteroseptal,anterior, anteroseptal walls and apex are akinetic. Marland Kitchen Left ventricular ejection fraction, by estimation, is 15%. The left ventricle has  severely decreased function. The left ventricle demonstrates global hypokinesis. Left ventricular  diastolic parameters are indeterminate.  2. Right ventricular systolic function is mildly reduced. The right ventricular size is mildly enlarged.  3. Left atrial size was moderately dilated.  4. Right atrial size was moderately dilated.  5. The mitral valve is normal in structure. Mild mitral valve regurgitation. No evidence of mitral stenosis.  6. Tricuspid valve regurgitation is moderate.  7. The aortic valve is tricuspid. There is mild calcification of the aortic valve. There is mild thickening of the aortic valve. Aortic valve regurgitation is not visualized. No aortic stenosis is present.  8. The inferior vena cava is normal in size with <50% respiratory variability, suggesting right atrial pressure of 8 mmHg. FINDINGS  Left Ventricle: Severe global hypokinesis. The anteroseptal,anterior, anteroseptal walls and apex are akinetic. Left ventricular ejection fraction, by estimation, is 15%. The left ventricle has severely decreased function. The left ventricle demonstrates global hypokinesis. The left ventricular internal cavity size was normal in size. There is no left ventricular hypertrophy. Left ventricular diastolic parameters are indeterminate. Right Ventricle: The right ventricular size is mildly enlarged. No increase in right ventricular wall thickness. Right ventricular systolic function is mildly reduced. Left Atrium: Left atrial size was moderately dilated. Right Atrium: Right atrial size was moderately dilated. Pericardium: There is no evidence of pericardial effusion. Mitral Valve: The mitral valve is normal in structure. Mild mitral valve regurgitation. No evidence of mitral valve stenosis. Tricuspid Valve: The tricuspid valve is normal in structure. Tricuspid valve regurgitation is moderate . No evidence of tricuspid stenosis. Aortic Valve: The aortic valve is tricuspid. There is mild calcification of the aortic valve. There is mild thickening of the aortic valve. There is mild aortic valve annular  calcification. Aortic valve regurgitation is not visualized. No aortic stenosis  is present. Aortic valve mean gradient measures 1.2 mmHg. Aortic valve peak gradient measures 2.3 mmHg. Aortic valve area, by VTI measures 2.07 cm. Pulmonic Valve: The pulmonic valve was not well visualized. Pulmonic valve regurgitation is not visualized. No evidence of pulmonic stenosis. Aorta: The aortic root is normal in size and structure. Pulmonary Artery: Moderate pulmonary HTN, PASP is 45 mmHg. Venous: The inferior vena cava is normal in size with less than 50% respiratory variability, suggesting right atrial pressure of 8 mmHg. IAS/Shunts: The interatrial septum was not well visualized.  LEFT VENTRICLE PLAX 2D LVIDd:         5.33 cm LVIDs:         4.94 cm LV PW:         1.09 cm LV IVS:        0.96 cm LVOT diam:     2.10 cm LV SV:         20 LV SV Index:   9 LVOT Area:     3.46 cm  RIGHT VENTRICLE RV S prime:     7.80 cm/s TAPSE (M-mode): 1.2 cm LEFT ATRIUM              Index       RIGHT ATRIUM           Index LA diam:        4.90 cm  2.28 cm/m  RA Area:     18.80 cm LA Vol (A2C):   123.0 ml 57.22 ml/m RA Volume:   55.90 ml  26.01 ml/m LA Vol (A4C):   76.5 ml  35.59 ml/m LA Biplane Vol: 97.9 ml  45.55 ml/m  AORTIC VALVE AV Area (Vmax):    1.70 cm AV Area (Vmean):   1.63 cm AV Area (VTI):     2.07 cm AV Vmax:           76.60 cm/s AV Vmean:          49.811 cm/s AV VTI:            0.096 m AV Peak Grad:      2.3 mmHg AV Mean Grad:      1.2 mmHg LVOT Vmax:         37.70 cm/s LVOT Vmean:        23.431 cm/s LVOT VTI:          0.058 m LVOT/AV VTI ratio: 0.60  AORTA Ao Root diam: 3.30 cm MITRAL VALVE                TRICUSPID VALVE MV Area (PHT): 4.10 cm     TR Peak grad:   38.2 mmHg MV Decel Time: 185 msec     TR Vmax:        309.00 cm/s MR Peak grad: 76.0 mmHg MR Mean grad: 42.0 mmHg     SHUNTS MR Vmax:      436.00 cm/s   Systemic VTI:  0.06 m MR Vmean:     298.0 cm/s    Systemic Diam: 2.10 cm MV E velocity: 107.00 cm/s MV A  velocity: 92.50 cm/s MV E/A ratio:  1.16 Dina Rich MD Electronically signed by Dina Rich MD Signature Date/Time: 05/01/2020/4:25:50 PM    Final    US Abdomen Limited RUQ (LIVER/GB)  Result Date: 05/01/2020 CLINICAL DATA:  Elevated liver function tests for 1 week EXAM: ULTRASOUND ABDOMEN LIMITED RIGHT UPPER QUADRANT COMPARISON:  CT of the abdomen and pelvis of April 28, 2020 FINDINGS: Gallbladder: Post cholecystectomy Common bile duct: Diameter: 2.9 mm Liver: Mildly echogenic liver, mildly coarsened echotexture with suggestion of contour nodularity. No visible lesion on submitted images. Portal vein is patent on color Doppler imaging with normal direction of blood flow towards the liver. Other: RIGHT pleural effusion incidentally imaged. IMPRESSION: Possible hepatic steatosis and early liver disease based on hepatic morphology and echotexture. Post cholecystectomy without biliary duct dilation. RIGHT pleural effusion, partially visualized. Electronically Signed   By: Donzetta Kohut M.D.   On: 05/01/2020 09:30     CBC Recent Labs  Lab 04/28/20 0441 04/28/20 2303 04/30/20 1136 05/01/20 0428 05/02/20 0455  WBC 6.1 8.2 5.7 6.7 6.7  HGB 12.0* 11.7* 10.6* 10.9* 11.3*  HCT 38.0* 37.1* 33.1* 34.5* 35.8*  PLT 232 204 147* 136* 143*  MCV 95.0 96.6 94.0 95.0 95.2  MCH 30.0 30.5 30.1 30.0 30.1  MCHC 31.6 31.5 32.0 31.6 31.6  RDW 14.8 15.3 15.6* 15.6* 15.8*  LYMPHSABS 1.2 1.3 1.0  --   --   MONOABS 0.3 0.6 0.5  --   --   EOSABS 0.0 0.0 0.0  --   --   BASOSABS 0.0 0.0 0.0  --   --     Chemistries  Recent Labs  Lab 04/28/20 0441 04/28/20 0441 04/28/20 2303 04/29/20 0703 04/30/20 1136 05/01/20 0428 05/02/20 0455  NA 132*   < > 129* 133* 134* 134* 135  K 5.2*   < > 6.6* 5.3* 3.7 3.7 4.0  CL 100   < > 102 104 104 106 105  CO2 19*   < > 13* 14* 17* 17* 17*  GLUCOSE 185*   < > 206* 155* 213*  172* 139*  BUN 40*   < > 54* 57* 56* 58* 60*  CREATININE 1.78*   < > 2.58* 2.56* 2.21*  2.22* 2.32*  CALCIUM 9.5   < > 8.8* 8.8* 8.8* 8.4* 8.7*  MG  --   --   --   --   --  1.8  --   AST 55*  --  672*  --  664* 448* 432*  ALT 36  --  426*  --  660* 605* 629*  ALKPHOS 93  --  131*  --  113 105 114  BILITOT 2.0*  --  2.3*  --  2.0* 1.9* 1.8*   < > = values in this interval not displayed.   ------------------------------------------------------------------------------------------------------------------ No results for input(s): CHOL, HDL, LDLCALC, TRIG, CHOLHDL, LDLDIRECT in the last 72 hours.  Lab Results  Component Value Date   HGBA1C 7.8 (H) 04/30/2020   ------------------------------------------------------------------------------------------------------------------ No results for input(s): TSH, T4TOTAL, T3FREE, THYROIDAB in the last 72 hours.  Invalid input(s): FREET3 ------------------------------------------------------------------------------------------------------------------ Recent Labs    04/30/20 2034 04/30/20 2035  VITAMINB12 1,646*  --   FOLATE  --  11.6  FERRITIN 573*  --   TIBC 290  --   IRON 38*  --   RETICCTPCT  --  2.0    Coagulation profile No results for input(s): INR, PROTIME in the last 168 hours.  No results for input(s): DDIMER in the last 72 hours.  Cardiac Enzymes No results for input(s): CKMB, TROPONINI, MYOGLOBIN in the last 168 hours.  Invalid input(s): CK ------------------------------------------------------------------------------------------------------------------    Component Value Date/Time   BNP 2,068.0 (H) 05/02/2020 8416     Shon Hale M.D on 05/02/2020 at 6:30 PM  Go to [email or web address] - for contact info  [facility] - Office  [phone]

## 2020-05-02 NOTE — Progress Notes (Signed)
Lab called critical value of aPTT >200.  MD notified via text page.

## 2020-05-02 NOTE — Consult Note (Addendum)
Cardiology Consult    Patient ID: Warren Mcdonald; [id number]; 25-Jan-1930   Admit date: 04/28/2020 Date of Consult: 05/02/2020  Primary Care Provider: Anabel Halon, MD Primary Cardiologist: Dina Rich, MD   Patient Profile    Warren Mcdonald is a 86 y.o. male with past medical history of chronic combined systolic and diastolic CHF (EF 18-29% by echo in 09/2019, at 15% by echo this admission), paroxysmal atrial fibrillation, PAD (s/p left AKA in 06/2019), HTN, HLD and Type 2 DM who is being seen today for the evaluation of NSTEMI at the request of Dr. Mariea Clonts.   History of Present Illness    Warren Mcdonald was recently evaluated by Corine Shelter, PA-C on 04/11/2020 and reported doing well but was unsure what medications he was taking at that time. No changes were made but a nurse visit was arranged to review medications. Was listed as taking two different doses of Lopressor (50mg  BID and 25mg  BID but was previously on Toprol-XL 200mg  daily), ASA, Eliquis, Losartan 12.5mg  daily, Atorvastatin 20mg  daily and Lasix 40mg  daily.   He presented to [facility] ED on 04/28/2020 for evaluation of abdominal pain and distension for the past few days. Was found to be in atrial fibrillation with RVR upon arrival. Lactic Acid was elevated to 6.5 with repeat of 7.9. CT Abdomen showed duodenal wall thickening with stranding and felt to be duodenitis or PUD. Follow-up showed possible hepatic steatosis. BNP was elevated to 2416. Initial HS Troponin 939 with repeat values of 838, 579 and 913.  He was hypotensive while in the ED and required initiation of Dobutamine. Dobutamine was able to be discontinued and he has remained on IV Amiodarone for his atrial fibrillation with RVR. Was initially going to be admitted to [facility] but due to bed-hold, was admitted here to [facility].   Follow-up echocardiogram obtained yesterday showed a reduced EF of 15% with severe global hypokinesis and the  anteroseptal,anterior, anteroseptal walls and apex being akinetic. He did have mild MR and moderate TR with mildly reduced RV function.   Most recent labs this AM show BNP remains elevated to 2068. Creatinine has trended upwards to 2.32 (at 1.78 on admission). AST 432 and ALT 629. He continues to report abdominal pain and distension. Denies any recent chest pain or palpitations but does report baseline dyspnea on exertion and 3-4 pillow orthopnea.    Past Medical History:  Diagnosis Date  . Abdominal distension (gaseous)   . Anemia, unspecified   . Chronic diastolic (congestive) heart failure (HCC)   . Essential (primary) hypertension   . Hyperlipidemia   . PAF (paroxysmal atrial fibrillation) (HCC)   . Peripheral vascular disease (HCC) 2011   Left above-knee popliteal to posterior tibial artery bypass   . PUD (peptic ulcer disease)   . Type 2 diabetes mellitus with diabetic peripheral angiopathy without gangrene [facility])     Past Surgical History:  Procedure Laterality Date  . ABDOMINAL AORTOGRAM N/A 05/24/2019   Procedure: ABDOMINAL AORTOGRAM;  Surgeon: [facility], MD;  Location: [facility] INVASIVE CV LAB;  Service: Cardiovascular;  Laterality: N/A;  . AMPUTATION Left 06/16/2019   Procedure: AMPUTATION DIGIT FOURTH TOE LEFT FOOT;  Surgeon: [facility], MD;  Location: [facility] OR;  Service: Vascular;  Laterality: Left;  . AMPUTATION Left 06/26/2019   Procedure: AMPUTATION two DIGITs, left toe;  Surgeon: Maeola Harman, MD;  Location: [facility] OR;  Service: Vascular;  Laterality: Left;  . AMPUTATION Left 07/02/2019  Procedure: left AMPUTATION ABOVE KNEE;  Surgeon: Larina Earthly, MD;  Location: [facility] OR;  Service: Vascular;  Laterality: Left;  . APPENDECTOMY    . CATARACT EXTRACTION W/ INTRAOCULAR LENS  IMPLANT, BILATERAL    . CHOLECYSTECTOMY N/A 09/08/2014   Procedure: CHOLECYSTECTOMY;  Surgeon: Franky Macho Md, MD;  Location: [facility] ORS;  Service: General;  Laterality: N/A;  . EYE  SURGERY    . I & D EXTREMITY Left 06/30/2019   Procedure: sharp incisonal DEBRIDEMENT of left foot including bone, tendon, muscle, and skin;  Surgeon: Cephus Shelling, MD;  Location: [facility] OR;  Service: Vascular;  Laterality: Left;  . LOWER EXTREMITY ANGIOGRAPHY Left 05/24/2019   Procedure: Lower Extremity Angiography;  Surgeon: Maeola Harman, MD;  Location: [facility] INVASIVE CV LAB;  Service: Cardiovascular;  Laterality: Left;  . PERIPHERAL VASCULAR BALLOON ANGIOPLASTY Left 05/24/2019   Procedure: PERIPHERAL VASCULAR BALLOON ANGIOPLASTY;  Surgeon: Maeola Harman, MD;  Location: [facility] INVASIVE CV LAB;  Service: Cardiovascular;  Laterality: Left;  tibial bypass  . PR VEIN BYPASS GRAFT,AORTO-FEM-POP     Left above knee popliteal to posterior tibial artery bypass graft     Home Medications:  Prior to Admission medications   Medication Sig Start Date End Date Taking? Authorizing Provider  apixaban (ELIQUIS) 2.5 MG TABS tablet Take 1 tablet (2.5 mg total) by mouth 2 (two) times daily. 03/27/20  Yes Dyann Kief, PA-C  atorvastatin (LIPITOR) 20 MG tablet Take 1 tablet (20 mg total) by mouth daily at 6 PM. 03/07/20  Yes Anabel Halon, MD  furosemide (LASIX) 40 MG tablet Take 40 mg by mouth daily.   Yes [provider]  pantoprazole (PROTONIX) 40 MG tablet Take 1 tablet (40 mg total) by mouth daily. 04/28/20  Yes Bethann Berkshire, MD    Inpatient Medications: Scheduled Meds: . aspirin  81 mg Oral Q breakfast  . Chlorhexidine Gluconate Cloth  6 each Topical Daily  . insulin aspart  0-5 Units Subcutaneous QHS  . insulin aspart  0-9 Units Subcutaneous TID WC  . metoprolol tartrate  12.5 mg Oral BID  . pantoprazole  40 mg Oral Daily   Continuous Infusions: . sodium chloride 50 mL/hr at 05/02/20 0918  . amiodarone 30 mg/hr (05/02/20 0744)  . heparin 900 Units/hr (05/02/20 0915)   PRN Meds: acetaminophen **OR** acetaminophen, ondansetron **OR** ondansetron (ZOFRAN)  IV  Allergies:   No Known Allergies  Social History:   Social History   Socioeconomic History  . Marital status: Married    Spouse name: Not on file  . Number of children: Not on file  . Years of education: Not on file  . Highest education level: Not on file  Occupational History  . Not on file  Tobacco Use  . Smoking status: Former Smoker    Types: Cigars    Quit date: 05/27/1988    Years since quitting: 31.9  . Smokeless tobacco: Former Neurosurgeon    Types: Chew    Quit date: 05/27/1988  Vaping Use  . Vaping Use: Never used  Substance and Sexual Activity  . Alcohol use: No    Alcohol/week: 0.0 standard drinks  . Drug use: No  . Sexual activity: Not on file  Other Topics Concern  . Not on file  Social History Narrative  . Not on file   Social Determinants of Health   Financial Resource Strain:   . Difficulty of Paying Living Expenses: Not on file  Food Insecurity: No Food Insecurity  .  Worried About Programme researcher, broadcasting/film/video in the Last Year: Never true  . Ran Out of Food in the Last Year: Never true  Transportation Needs: No Transportation Needs  . Lack of Transportation (Medical): No  . Lack of Transportation (Non-Medical): No  Physical Activity:   . Days of Exercise per Week: Not on file  . Minutes of Exercise per Session: Not on file  Stress:   . Feeling of Stress : Not on file  Social Connections:   . Frequency of Communication with Friends and Family: Not on file  . Frequency of Social Gatherings with Friends and Family: Not on file  . Attends Religious Services: Not on file  . Active Member of Clubs or Organizations: Not on file  . Attends Banker Meetings: Not on file  . Marital Status: Not on file  Intimate Partner Violence:   . Fear of Current or Ex-Partner: Not on file  . Emotionally Abused: Not on file  . Physically Abused: Not on file  . Sexually Abused: Not on file     Family History:    Family History  Problem Relation Age of Onset  .  Heart disease Mother   . Hypertension Mother   . Heart attack Mother   . Heart attack Father   . Heart disease Father        Heart disease before age 18  . Cancer Sister   . Deep vein thrombosis Sister   . Diabetes Sister        Amputation-right leg  . Cancer Daughter   . Diabetes Daughter   . Cancer Brother   . Diabetes Other   . Prostate cancer Other       Review of Systems    General:  No chills, fever, night sweats or weight changes.  Cardiovascular:  No chest pain, palpitations, paroxysmal nocturnal dyspnea. Positive for dyspnea on exertion and orthopnea.  Dermatological: No rash, lesions/masses Respiratory: No cough, Positive for dyspnea.  Urologic: No hematuria, dysuria Abdominal:   No nausea, vomiting, diarrhea, bright red blood per rectum, melena, or hematemesis. Positive for abdominal distention.  Neurologic:  No visual changes, wkns, changes in mental status. All other systems reviewed and are otherwise negative except as noted above.  Physical Exam/Data    Vitals:   05/02/20 0500 05/02/20 0600 05/02/20 0700 05/02/20 0733  BP: 106/81 90/70 101/73   Pulse: 96 94 84   Resp: (!) 21 (!) 25 19   Temp:    (!) 97.1 F (36.2 C)  TempSrc:    Axillary  SpO2: 96% 98% 100%   Weight:      Height:        Intake/Output Summary (Last 24 hours) at 05/02/2020 1026 Last data filed at 05/02/2020 0744 Gross per 24 hour  Intake 847.08 ml  Output 150 ml  Net 697.08 ml   Filed Weights   04/30/20 1553 05/01/20 0500  Weight: 83.3 kg 90.5 kg   Body mass index is 26.32 kg/m.   General: Pleasant elderly male appearing in NAD Psych: Normal affect. Neuro: Alert and oriented X 3. Moves all extremities spontaneously. HEENT: Normal  Neck: Supple without bruits. JVD elevated to 9 cm. Lungs:  Resp regular and unlabored, rales along bases. Heart: Irregularly irregular. no s3, s4, or murmurs. Abdomen: Soft, non-tender, non-distended, BS + x 4.  Extremities: No clubbing, cyanosis  or edema. Trace edema along RLE. Left AKA.    EKG:  The EKG was personally reviewed and demonstrates:  Atrial fibrillation, HR 111 with TWI along the lateral leads.    Labs/Studies     Relevant CV Studies:  Echocardiogram: 05/01/2020 IMPRESSIONS    1. Severe global hypokinesis. The anteroseptal,anterior, anteroseptal  walls and apex are akinetic. Marland Kitchen Left ventricular ejection fraction, by  estimation, is 15%. The left ventricle has severely decreased function.  The left ventricle demonstrates global  hypokinesis. Left ventricular diastolic parameters are indeterminate.  2. Right ventricular systolic function is mildly reduced. The right  ventricular size is mildly enlarged.  3. Left atrial size was moderately dilated.  4. Right atrial size was moderately dilated.  5. The mitral valve is normal in structure. Mild mitral valve  regurgitation. No evidence of mitral stenosis.  6. Tricuspid valve regurgitation is moderate.  7. The aortic valve is tricuspid. There is mild calcification of the  aortic valve. There is mild thickening of the aortic valve. Aortic valve  regurgitation is not visualized. No aortic stenosis is present.  8. The inferior vena cava is normal in size with <50% respiratory  variability, suggesting right atrial pressure of 8 mmHg.   Laboratory Data:  Chemistry Recent Labs  Lab 04/30/20 1136 05/01/20 0428 05/02/20 0455  NA 134* 134* 135  K 3.7 3.7 4.0  CL 104 106 105  CO2 17* 17* 17*  GLUCOSE 213* 172* 139*  BUN 56* 58* 60*  CREATININE 2.21* 2.22* 2.32*  CALCIUM 8.8* 8.4* 8.7*  GFRNONAA 28* 28* 26*  ANIONGAP Recent Labs  Lab 04/30/20 1136 05/01/20 0428 05/02/20 0455  PROT 7.2 6.9 6.9  ALBUMIN 3.8 3.6 3.6  AST 664* 448* 432*  ALT 660* 605* 629*  ALKPHOS 113 105 114  BILITOT 2.0* 1.9* 1.8*   Hematology Recent Labs  Lab 04/30/20 1136 04/30/20 2035 05/01/20 0428 05/02/20 0455  WBC 5.7  --  6.7 6.7  RBC 3.52* 3.64*  3.63* 3.76*  HGB 10.6*  --  10.9* 11.3*  HCT 33.1*  --  34.5* 35.8*  MCV 94.0  --  95.0 95.2  MCH 30.1  --  30.0 30.1  MCHC 32.0  --  31.6 31.6  RDW 15.6*  --  15.6* 15.8*  PLT 147*  --  136* 143*   Cardiac EnzymesNo results for input(s): TROPONINI in the last 168 hours. No results for input(s): TROPIPOC in the last 168 hours.  BNP Recent Labs  Lab 04/30/20 2034 05/02/20 0455  BNP 2,416.0* 2,068.0*    DDimer No results for input(s): DDIMER in the last 168 hours.  Radiology/Studies:  DG CHEST PORT 1 VIEW  Result Date: 05/02/2020 CLINICAL DATA:  Shortness of breath. EXAM: PORTABLE CHEST 1 VIEW COMPARISON:  04/30/2020. FINDINGS: Mediastinum and hilar structures normal. Stable cardiomegaly. No pulmonary venous congestion. Mild bibasilar atelectasis/infiltrates. No pleural effusion or pneumothorax. IMPRESSION: 1. Stable cardiomegaly.  No pulmonary venous congestion. 2. Mild bibasilar atelectasis/infiltrates. Electronically Signed   By: Maisie Fus  Register   On: 05/02/2020 06:58   DG Chest Port 1 View  Result Date: 04/30/2020 CLINICAL DATA:  Weakness EXAM: PORTABLE CHEST 1 VIEW COMPARISON:  02/20/2020 FINDINGS: Cardiac shadow is enlarged but stable. Lungs are well aerated bilaterally. No focal infiltrate or effusion is seen. No bony abnormality is noted. IMPRESSION: No active disease. Electronically Signed   By: Alcide Clever M.D.   On: 04/30/2020 12:20   DG Abdomen Acute W/Chest  Result Date: 04/28/2020 CLINICAL DATA:  Abdominal pain EXAM: DG ABDOMEN ACUTE WITH 1 VIEW CHEST COMPARISON:  04/28/2020 FINDINGS: Supine  and upright frontal views of the abdomen as well as an upright frontal view of the chest are obtained. Cardiac silhouette is stable. No airspace disease. Small bilateral effusions. No pneumothorax. No bowel obstruction or ileus. Continued gaseous distention of the colon. Excreted contrast within the urinary bladder from previous CT. No free gas in the greater peritoneal sac. No  abdominal masses or abnormal calcifications. IMPRESSION: 1. No evidence of bowel obstruction or ileus. 2. Stable gaseous distension of the sigmoid colon. 3. Small bilateral pleural effusions. Electronically Signed   By: Sharlet Salina M.D.   On: 04/28/2020 22:42   US Abdomen Limited RUQ (LIVER/GB)  Result Date: 05/01/2020 CLINICAL DATA:  Elevated liver function tests for 1 week EXAM: ULTRASOUND ABDOMEN LIMITED RIGHT UPPER QUADRANT COMPARISON:  CT of the abdomen and pelvis of April 28, 2020 FINDINGS: Gallbladder: Post cholecystectomy Common bile duct: Diameter: 2.9 mm Liver: Mildly echogenic liver, mildly coarsened echotexture with suggestion of contour nodularity. No visible lesion on submitted images. Portal vein is patent on color Doppler imaging with normal direction of blood flow towards the liver. Other: RIGHT pleural effusion incidentally imaged. IMPRESSION: Possible hepatic steatosis and early liver disease based on hepatic morphology and echotexture. Post cholecystectomy without biliary duct dilation. RIGHT pleural effusion, partially visualized. Electronically Signed   By: Donzetta Kohut M.D.   On: 05/01/2020 09:30     Assessment & Plan    1. NSTEMI/Cardiogeneic Shock - He presented with progressive abdominal distention along with orthopnea and dyspnea on exertion, found to have an acute CHF exacerbation and to be in shock with lactic acid peaking at 7.9. HS troponin values peaked at 939 and repeat echocardiogram shows his EF is significantly reduced at 15% as outlined above. - At this time, he has remained on IV fluids given his shock presentation but is now off pressor support.  Would recommend discontinuation of IV fluids and will also stop Lopressor given concern for low output heart failure. If BP remains stable, would start IV Lasix for diuresis. Given his AKI, he is not a candidate for cardiac catheterization at this time. - Goals of care need to be reviewed with the patient and his  family further as he remains full code at this time. If he wishes to pursue aggressive intervention, would recommend transfer to [facility] and evaluation by the Advanced Heart Failure service as he will likely require inotropic support for continued diuresis and optimization of his heart failure. If they prefer conservative measures, then would focus on medical therapy. - Continue ASA and Heparin for now. No statin given elevated LFT's. Will stop Lopressor as outlined above. Not on ACE-I/ARB/ARNI given AKI.   2. Atrial fibrillation with RVR - Rates are now improved into the 80's to 90's. Continue IV Amiodarone for now as options are limited given his soft BP. - PTA Eliquis has been held and he is receiving Heparin for anticoagulation.  3. PAD - s/p left AKA in 06/2019. Continue ASA. Statin held given elevated LFT's.   4. Elevated LFT's - Labs this AM show AST 432 and ALT 629. Suspect secondary to shock liver. Continue to follow.    For questions or updates, please contact [facility] Please consult [email or web address] for contact info under Cardiology/STEMI.  Signed, Ellsworth Lennox, PA-C 05/02/2020, 10:26 AM Pager: [phone]   Attending note:  Patient seen and examined.  I reviewed his records and discussed the case with Ms. Iran Ouch PA-C.  Warren Mcdonald is a patient of Dr. Wyline Mood with a  history of cardiomyopathy, last LVEF 40 to 45% in May of this year, also PAF and known vascular disease.  He is currently admitted to the hospital reporting relative anorexia with abdominal pain and distention, found to be in rapid atrial fibrillation and with lactic acidosis at presentation.  Abdominal CT suggests possible duodenitis or PUD, he also has elevated LFTs, increased BNP of 2416, and abnormal high-sensitivity troponin I levels most consistent with heart failure exacerbation.  Follow-up echocardiography reports significant reduction in LVEF to 15% from prior baseline with wall motion  abnormalities and mildly reduced RV contraction.  He has required amiodarone for rate control of atrial fibrillation, has been mildly hypotensive as well.  On examination he is in no distress, states that he is weak and short of breath.  No active chest pain.  He is afebrile, heart rate is in the 90s in atrial fibrillation by telemetry which I personally reviewed.  Systolic blood pressure recently 90s to 100s.  Lungs exhibit decreased breath sounds.  He has irregular heart sounds with indistinct PMI, no obvious gallop.  Abdomen distended.  Potassium 4.0, creatinine 2.32, AST 432, ALT 629, BNP 2068, most recent high-sensitivity troponin I level 464, hemoglobin 11.3, platelets 143.  Chest x-ray reports cardiomegaly with no obvious vascular congestion, mild bibasilar atelectasis.  ECG shows atrial fibrillation in the 90s with left anterior fascicular block, LVH and nonspecific ST-T changes.  Presentation and constellation of features concerning for low output heart failure with newly documented cardiomyopathy in the range of 15%, mild RV dysfunction, abdominal distention with elevated LFTs, also relative hypotension.  Heart rate control of atrial fibrillation is adequate on IV amiodarone at this time.  Main question is course of treatment.  Palliative care consultation is pending for today which will hopefully help to establish further goals of care.  He would not be a good candidate for aggressive advanced heart failure techniques at age 69.  Inotropic support if initiated, would need to be considered a temporizing measure.  Jonelle Sidle, M.D., F.A.C.C.

## 2020-05-02 NOTE — Progress Notes (Addendum)
ANTICOAGULATION CONSULT NOTE   Pharmacy Consult for Heparin Indication: chest pain/ACS/afib  No Known Allergies  Patient Measurements: Height: 6\' 1"  (185.4 cm) Weight: 90.5 kg (199 lb 8.3 oz) IBW/kg (Calculated) : 79.9 Heparin Dosing Weight: 83 kg  Vital Signs: Temp: 97.5 F (36.4 C) (12/07 0100) Temp Source: Axillary (12/07 0100) BP: 90/70 (12/07 0600) Pulse Rate: 94 (12/07 0600)  Labs: Recent Labs    04/29/20 1102 04/30/20 1136 04/30/20 1136 04/30/20 1311 04/30/20 2034 05/01/20 0428 05/01/20 2232 05/02/20 0455  HGB  --  10.6*   < >  --   --  10.9*  --  11.3*  HCT  --  33.1*  --   --   --  34.5*  --  35.8*  PLT  --  147*  --   --   --  136*  --  143*  APTT  --   --   --   --   --   --  98*  --   HEPARINUNFRC  --   --   --   --   --   --  2.00*  --   CREATININE  --  2.21*  --   --   --  2.22*  --  2.32*  TROPONINIHS   < >  --   --  579* 913*  --   --  477*   < > = values in this interval not displayed.    Estimated Creatinine Clearance: 24.9 mL/min (A) (by C-G formula based on SCr of 2.32 mg/dL (H)).   Medical History: Past Medical History:  Diagnosis Date  . Abdominal distension (gaseous)   . Anemia, unspecified   . Chronic diastolic (congestive) heart failure (HCC)   . Essential (primary) hypertension   . Hyperlipidemia   . PAF (paroxysmal atrial fibrillation) (HCC)   . Peripheral vascular disease (HCC) 2011   Left above-knee popliteal to posterior tibial artery bypass   . PUD (peptic ulcer disease)   . Type 2 diabetes mellitus with diabetic peripheral angiopathy without gangrene (HCC)     Medications:  Medications Prior to Admission  Medication Sig Dispense Refill Last Dose  . apixaban (ELIQUIS) 2.5 MG TABS tablet Take 1 tablet (2.5 mg total) by mouth 2 (two) times daily. 60 tablet 6 04/27/2020 at 1700  . atorvastatin (LIPITOR) 20 MG tablet Take 1 tablet (20 mg total) by mouth daily at 6 PM. 30 tablet 6 04/27/2020  . pantoprazole (PROTONIX) 40 MG  tablet Take 1 tablet (40 mg total) by mouth daily. 30 tablet 1 04/27/2020   Scheduled:  . aspirin  81 mg Oral Q breakfast  . atorvastatin  40 mg Oral q1800  . Chlorhexidine Gluconate Cloth  6 each Topical Daily  . insulin aspart  0-5 Units Subcutaneous QHS  . insulin aspart  0-9 Units Subcutaneous TID WC  . metoprolol tartrate  12.5 mg Oral BID  . pantoprazole  40 mg Oral Daily   Infusions:  . amiodarone 30 mg/hr (05/02/20 0014)  . heparin 1,200 Units/hr (05/01/20 2115)    Assessment: Pt was admitted for abd pain. He has been on apixaban for his afib with the last dose taken this AM. Apixaban will be transition to IV heparin for NSTEMI. Cardiac workup pending for any potential procedures.   12/7 AM update:  APTT elevated (>200) Liver issues likely playing a part No issues per RN  Goal of Therapy:  Heparin level 0.3-0.7 units/ml  APTT 66-102 secs Monitor platelets by anticoagulation  protocol: Yes   Plan:  Hold heparin x 2 hours Re-start heparin drip at 900 units/hr at 0900 1600 heparin level and aPTT  Abran Duke, PharmD, BCPS Clinical Pharmacist Phone: [phone]

## 2020-05-03 ENCOUNTER — Other Ambulatory Visit: Payer: Self-pay | Admitting: *Deleted

## 2020-05-03 ENCOUNTER — Ambulatory Visit: Payer: Self-pay | Admitting: *Deleted

## 2020-05-03 DIAGNOSIS — Z7189 Other specified counseling: Secondary | ICD-10-CM

## 2020-05-03 DIAGNOSIS — Z66 Do not resuscitate: Secondary | ICD-10-CM

## 2020-05-03 DIAGNOSIS — I5023 Acute on chronic systolic (congestive) heart failure: Secondary | ICD-10-CM

## 2020-05-03 DIAGNOSIS — Z515 Encounter for palliative care: Secondary | ICD-10-CM

## 2020-05-03 LAB — CBC
HCT: 34.2 % — ABNORMAL LOW (ref 39.0–52.0)
Hemoglobin: 10.5 g/dL — ABNORMAL LOW (ref 13.0–17.0)
MCH: 29.4 pg (ref 26.0–34.0)
MCHC: 30.7 g/dL (ref 30.0–36.0)
MCV: 95.8 fL (ref 80.0–100.0)
Platelets: 104 10*3/uL — ABNORMAL LOW (ref 150–400)
RBC: 3.57 MIL/uL — ABNORMAL LOW (ref 4.22–5.81)
RDW: 16.1 % — ABNORMAL HIGH (ref 11.5–15.5)
WBC: 8.6 10*3/uL (ref 4.0–10.5)
nRBC: 1.2 % — ABNORMAL HIGH (ref 0.0–0.2)

## 2020-05-03 LAB — COMPREHENSIVE METABOLIC PANEL
ALT: 1012 U/L — ABNORMAL HIGH (ref 0–44)
AST: 1613 U/L — ABNORMAL HIGH (ref 15–41)
Albumin: 3.5 g/dL (ref 3.5–5.0)
Alkaline Phosphatase: 167 U/L — ABNORMAL HIGH (ref 38–126)
Anion gap: 17 — ABNORMAL HIGH (ref 5–15)
BUN: 72 mg/dL — ABNORMAL HIGH (ref 8–23)
CO2: 15 mmol/L — ABNORMAL LOW (ref 22–32)
Calcium: 8.6 mg/dL — ABNORMAL LOW (ref 8.9–10.3)
Chloride: 102 mmol/L (ref 98–111)
Creatinine, Ser: 3.04 mg/dL — ABNORMAL HIGH (ref 0.61–1.24)
GFR, Estimated: 19 mL/min — ABNORMAL LOW (ref >60)
Glucose, Bld: 158 mg/dL — ABNORMAL HIGH (ref 70–99)
Potassium: 4.4 mmol/L (ref 3.5–5.1)
Sodium: 134 mmol/L — ABNORMAL LOW (ref 135–145)
Total Bilirubin: 2.2 mg/dL — ABNORMAL HIGH (ref 0.3–1.2)
Total Protein: 6.9 g/dL (ref 6.5–8.1)

## 2020-05-03 LAB — GLUCOSE, CAPILLARY
Glucose-Capillary: 135 mg/dL — ABNORMAL HIGH (ref 70–99)
Glucose-Capillary: 147 mg/dL — ABNORMAL HIGH (ref 70–99)

## 2020-05-03 MED ORDER — HALOPERIDOL LACTATE 2 MG/ML PO CONC: 0.5000 mg | ORAL | Status: DC | PRN

## 2020-05-03 MED ORDER — ONDANSETRON 4 MG PO TBDP: 4.0000 mg | ORAL_TABLET | Freq: Four times a day (QID) | ORAL | Status: DC | PRN

## 2020-05-03 MED ORDER — POLYVINYL ALCOHOL 1.4 % OP SOLN: 1.0000 [drp] | Freq: Four times a day (QID) | OPHTHALMIC | Status: DC | PRN

## 2020-05-03 MED ORDER — OXYCODONE HCL 20 MG/ML PO CONC: 5.0000 mg | ORAL | Status: DC | PRN

## 2020-05-03 MED ORDER — HALOPERIDOL 0.5 MG PO TABS: 0.5000 mg | ORAL_TABLET | ORAL | Status: DC | PRN

## 2020-05-03 MED ORDER — GLYCOPYRROLATE 0.2 MG/ML IJ SOLN: 0.2000 mg | INTRAMUSCULAR | Status: DC | PRN

## 2020-05-03 MED ORDER — FUROSEMIDE 10 MG/ML IJ SOLN
60.0000 mg | Freq: Two times a day (BID) | INTRAMUSCULAR | Status: AC
  Administered 2020-05-03 (×2): 60 mg via INTRAVENOUS
  Filled 2020-05-03 (×2): qty 6

## 2020-05-03 MED ORDER — ONDANSETRON HCL 4 MG/2ML IJ SOLN: 4.0000 mg | Freq: Four times a day (QID) | INTRAMUSCULAR | Status: DC | PRN

## 2020-05-03 MED ORDER — FUROSEMIDE 10 MG/ML IJ SOLN: 60.0000 mg | Freq: Two times a day (BID) | INTRAMUSCULAR | Status: DC

## 2020-05-03 MED ORDER — GLYCOPYRROLATE 1 MG PO TABS: 1.0000 mg | ORAL_TABLET | ORAL | Status: DC | PRN

## 2020-05-03 MED ORDER — HALOPERIDOL LACTATE 5 MG/ML IJ SOLN: 0.5000 mg | INTRAMUSCULAR | Status: DC | PRN

## 2020-05-03 NOTE — Progress Notes (Signed)
Patient Demographics:    Warren Mcdonald, is a 86 y.o. male, DOB - 1929-09-21, WUJ:[id number]  Admit date - 04/28/2020   Admitting Physician Warren Hoover Brunette, DO  Outpatient Primary MD for the patient is Warren Halon, MD  LOS - 4   Chief Complaint  Patient presents with  . Abdominal Pain        Subjective:    Warren Mcdonald still has some shortness of breath.  No chest pain.  Awake and alert.  Answers questions appropriately  Assessment  & Plan :    Principal Problem:   NSTEMI (non-ST elevated myocardial infarction)--with cardiogenic shock Active Problems:   DM (diabetes mellitus) (HCC)   Atrial fibrillation with rapid ventricular response (HCC)   Chronic combined systolic and diastolic congestive heart failure (HCC)   CKD (chronic kidney disease) stage 3, GFR 30-59 ml/min (HCC)   Hypotension   Atrial fibrillation with RVR (HCC)   Acute on chronic [name] (heart failure with reduced ejection fraction) --worse due to cardiogenic shock in the setting of NSTEMI   Brief Summary:- [name] with history of [name], AF , HTN, PAD and s/p Lt AKA, HLD, DM2, CKD IIIb,  with suspected decompensated right and left heart failure and AKI admitted on 04/30/2020 with persistent hypotension and found to have NSTEMI with cardiogenic shock and persistent hypotension as well as A. fib with RVR and CHF exacerbation with significant drop in EF from 40-45 % back in May 2021 down to 15 % this admission  A/p 1)[name]--- acute on chronic systolic CHF exacerbation due to NSTEMI with cardiogenic shock -- Echo from 05/01/2020 with EF of 15%, with global hypokinesis -Echo from 10/11/2019 had patient's EF at 40 to 45% -BNP  1,811 >>  2416 >>2,068 -Troponin peaked at 939,  it is now down to 464 -Hypotension has resolved --patient has been taken off dobutamine  -Cardiology input appreciated, as per cardiology team Pt  would Not be a good  candidate for aggressive advanced heart failure techniques at age 62.  Inotropic support if initiated by advanced heart failure team , would need to be considered a temporizing measure. -Continue IV heparin and aspirin  -Lipitor on hold due to elevated LFTs avoid ACEI/ARB/ARNI due to renal concerns -Soft blood pressure precludes IV Lasix use at this time  2 A. fib with RVR--- switch from Eliquis to IV heparin for anticoagulation due to NSTEMI -Challenges with rate control due to soft BP -Overall rate control improved with amiodarone drip  3)AKI----acute kidney injury on CKD stage -3B--worsening renal function due to persistent hypotension due to worsening heart failure and presumed NSTEMI ----   creatinine on admission= 1.78, baseline creatinine = 1.5 to 1.6   , creatinine is now= 3.04 --Likely related to low cardiac output --- renally adjust medications, avoid nephrotoxic agents / dehydration  / hypotension  4)PAD--- s/p Lt AKA  06/2019, continue aspirin IV heparin as ordered -Lipitor on hold due to elevated LFTs  5)DM2-A1c 7.8 reflecting uncontrolled DM with hyperglycemia PTA  -use Novolog/Humalog Sliding scale insulin with Accu-Cheks/Fingersticks as ordered   6) hypotension and lactic acidosis most likely due to cardiogenic shock above Rather than  frank sepsis  7) elevated LFTs--- due to persistent hypotension/shock liver, abdominal/liver ultrasound with possible  hepatic steatosis and elevated bili  8) elevated troponin--suspect NSTEMI with cardiogenic shock--- Echo with significant drop in EF and global hypokinesis,   -As per cardiology service not a candidate for invasive ischemia work-up at this time given due to kidney concerns --IV heparin, and aspirin ordered -No statin due to LFT elevation  9) chronic iron deficiency anemia--- work-up reveals iron deficiency, folate and B12 are WNL- -hemoglobin stable around 11.3 -Not a candidate for endoluminal evaluation due to hemodynamic  instability  10)Social/Ethics--evaluated by palliative care.  After discussion with family, they have elected to pursue comfort measures with hospice.  Disposition/Need for in-Hospital Stay-plans are for patient to discharge home with hospice if he remained stable and necessary equipment can be delivered  Status is: Inpatient  Remains inpatient appropriate because:see above   Disposition: The patient is from: Home              Anticipated d/c is to: TBD after PT eval when more medically stable              Anticipated d/c date is: > 3 days              Patient currently is not medically stable to d/c. Barriers: Not Clinically Stable-   Code Status :  -  Code Status: DNR   Family Communication:    NA (patient is alert, awake and coherent)  Discussed with granddaughter over the phone  Consults  :  Cardiology/palliative  DVT Prophylaxis  :   - SCDs /iv heparin  Lab Results  Component Value Date   PLT 104 (L) 05/03/2020    Inpatient Medications  Scheduled Meds: . Chlorhexidine Gluconate Cloth  6 each Topical Daily  . pantoprazole  40 mg Oral Daily  . senna-docusate  2 tablet Oral BID   Continuous Infusions:  PRN Meds:.acetaminophen **OR** acetaminophen, ALPRAZolam, glycopyrrolate **OR** glycopyrrolate **OR** glycopyrrolate, haloperidol **OR** haloperidol **OR** haloperidol lactate, ondansetron **OR** ondansetron (ZOFRAN) IV, oxyCODONE **OR** oxyCODONE, polyvinyl alcohol    Anti-infectives (From admission, onward)   None        Objective:   Vitals:   05/03/20 1415 05/03/20 1712 05/03/20 1800 05/03/20 2000  BP:      Pulse: 89 (!) 116 (!) 110 98  Resp: (!) 22 15 (!) 21 (!) 9  Temp:      TempSrc:      SpO2: 99% 95% 100% 100%  Weight:      Height:        Wt Readings from Last 3 Encounters:  05/01/20 90.5 kg  04/28/20 97.5 kg  04/11/20 97.5 kg     Intake/Output Summary (Last 24 hours) at 05/03/2020 2058 Last data filed at 05/03/2020 1610 Gross per 24 hour   Intake 344.6 ml  Output 400 ml  Net -55.4 ml    Physical Exam  General exam: Alert, awake, oriented x 3 Respiratory system: Crackles at bases bilaterally. Respiratory effort normal. Cardiovascular system:RRR. No murmurs, rubs, gallops. Gastrointestinal system: Abdomen is nondistended, soft and nontender. No organomegaly or masses felt. Normal bowel sounds heard. Central nervous system: Alert and oriented. No focal neurological deficits. Extremities: Left above-the-knee potation Skin: No rashes, lesions or ulcers Psychiatry: Judgement and insight appear normal. Mood & affect appropriate.     Data Review:   Micro Results Recent Results (from the past 240 hour(s))  Resp Panel by RT-PCR (Flu A&B, Covid) Nasopharyngeal Swab     Status: None   Collection Time: 04/28/20 10:59 PM   Specimen: Nasopharyngeal Swab;  Nasopharyngeal(NP) swabs in vial transport medium  Result Value Ref Range Status   SARS Coronavirus 2 by RT PCR NEGATIVE NEGATIVE Final    Comment: (NOTE) SARS-CoV-2 target nucleic acids are NOT DETECTED.  The SARS-CoV-2 RNA is generally detectable in upper respiratory specimens during the acute phase of infection. The lowest concentration of SARS-CoV-2 viral copies this assay can detect is 138 copies/mL. A negative result does not preclude SARS-Cov-2 infection and should not be used as the sole basis for treatment or other patient management decisions. A negative result may occur with  improper specimen collection/handling, submission of specimen other than nasopharyngeal swab, presence of viral mutation(s) within the areas targeted by this assay, and inadequate number of viral copies(<138 copies/mL). A negative result must be combined with clinical observations, patient history, and epidemiological information. The expected result is Negative.  Fact Sheet for Patients:  BloggerCourse.com  Fact Sheet for Healthcare Providers:   SeriousBroker.it  This test is no t yet approved or cleared by the [place] FDA and  has been authorized for detection and/or diagnosis of SARS-CoV-2 by FDA under an Emergency Use Authorization (EUA). This EUA will remain  in effect (meaning this test can be used) for the duration of the COVID-19 declaration under Section 564(b)(1) of the Act, 21 U.S.C.section 360bbb-3(b)(1), unless the authorization is terminated  or revoked sooner.       Influenza A by PCR NEGATIVE NEGATIVE Final   Influenza B by PCR NEGATIVE NEGATIVE Final    Comment: (NOTE) The Xpert Xpress SARS-CoV-2/FLU/RSV plus assay is intended as an aid in the diagnosis of influenza from Nasopharyngeal swab specimens and should not be used as a sole basis for treatment. Nasal washings and aspirates are unacceptable for Xpert Xpress SARS-CoV-2/FLU/RSV testing.  Fact Sheet for Patients: BloggerCourse.com  Fact Sheet for Healthcare Providers: SeriousBroker.it  This test is not yet approved or cleared by the [place] FDA and has been authorized for detection and/or diagnosis of SARS-CoV-2 by FDA under an Emergency Use Authorization (EUA). This EUA will remain in effect (meaning this test can be used) for the duration of the COVID-19 declaration under Section 564(b)(1) of the Act, 21 U.S.C. section 360bbb-3(b)(1), unless the authorization is terminated or revoked.  Performed at [facility], [address], [place], [place] [address]   Culture, blood (routine x 2)     Status: None (Preliminary result)   Collection Time: 04/29/20 12:58 AM   Specimen: BLOOD LEFT HAND  Result Value Ref Range Status   Specimen Description BLOOD LEFT HAND  Final   Special Requests   Final    BOTTLES DRAWN AEROBIC ONLY Blood Culture results may not be optimal due to an inadequate volume of blood received in culture bottles   Culture   Final    NO GROWTH 2  DAYS Performed at [facility], [address], [place], [place] [address]    Report Status PENDING  Incomplete  Culture, blood (routine x 2)     Status: None (Preliminary result)   Collection Time: 04/29/20  1:06 AM   Specimen: Left Antecubital; Blood  Result Value Ref Range Status   Specimen Description LEFT ANTECUBITAL  Final   Special Requests   Final    BOTTLES DRAWN AEROBIC ONLY Blood Culture adequate volume   Culture   Final    NO GROWTH 2 DAYS Performed at [facility], [address], [place], [place] [address]    Report Status PENDING  Incomplete  MRSA PCR Screening  Status: None   Collection Time: 04/30/20  5:07 PM   Specimen: Nasopharyngeal  Result Value Ref Range Status   MRSA by PCR NEGATIVE NEGATIVE Final    Comment:        The GeneXpert MRSA Assay (FDA approved for NASAL specimens only), is one component of a comprehensive MRSA colonization surveillance program. It is not intended to diagnose MRSA infection nor to guide or monitor treatment for MRSA infections. Performed at [facility], [address] [place], [place] [address]     Radiology Reports CT ABDOMEN PELVIS W CONTRAST  Result Date: 04/28/2020 CLINICAL DATA:  Mid abdominal pain and distension, previous cholecystectomy and appendectomy EXAM: CT ABDOMEN AND PELVIS WITH CONTRAST TECHNIQUE: Multidetector CT imaging of the abdomen and pelvis was performed using the standard protocol following bolus administration of intravenous contrast. CONTRAST:  75mL OMNIPAQUE IOHEXOL 300 MG/ML  SOLN COMPARISON:  02/20/2020 FINDINGS: Lower chest: Slight increase in the small bilateral pleural effusions with associated bibasilar compressive atelectasis. Heart is enlarged. No pericardial effusion. Distal thoracic aorta atherosclerotic. Degenerative changes of the lower thoracic spine. Hepatobiliary: Reflux of contrast into the IVC and hepatic veins suggesting right heart failure. No focal hepatic abnormality or  intrahepatic biliary dilatation. Remote cholecystectomy. Pancreas: Unremarkable. No pancreatic ductal dilatation or surrounding inflammatory changes. Spleen: Normal in size without focal abnormality. Adrenals/Urinary Tract: Normal adrenal glands. Bilateral renal cysts again noted. No renal obstruction or hydronephrosis. No hydroureter or ureteral calculus. Bladder is collapsed accounting for wall prominence. Stomach/Bowel: Similar wall thickening of the duodenum within adjacent duodenal air-fluid level which may represent a diverticulum. Surrounding right upper quadrant strandy edema. Findings remain consistent with duodenitis. Difficult to exclude peptic ulcer disease. No free air. Similar trace strandy edema along the right pericolic gutter and free fluid. Small amount of dependent pelvic free fluid as before. Colon remains moderately air distended but the small bowel is decompressed. This remains nonspecific. Chronic colonic ileus/dysmotility could have this appearance. No focal fluid collection or abscess.  No hemorrhage or hematoma. Vascular/Lymphatic: Aorta atherosclerotic. Negative for aneurysm. Mesenteric and renal vasculature appear to remain patent. No veno-occlusive process. No bulky adenopathy. Reproductive: Marked prostate enlargement. Other: Small fat containing inguinal hernias bilaterally. Intact abdominal wall. No ventral hernia. Musculoskeletal: Bones are osteopenic. Degenerative changes again noted of the spine. No acute compression fracture. IMPRESSION: Similar circumferential duodenal wall thickening in the right upper quadrant with strandy edema and small amount of free fluid along the right pericolic gutter into the pelvis. Again, duodenitis and/or peptic ulcer disease could have this appearance. Adjacent duodenal air-fluid level may represent a duodenal diverticulum. Slight increase in the small pleural effusions and bibasilar compressive atelectasis Cardiomegaly with CT evidence of right  heart failure. Nonspecific marked colonic distension without obstruction. See above comment. Aortic Atherosclerosis (ICD10-I70.0). Electronically Signed   By: Judie Petit.  Shick M.D.   On: 04/28/2020 09:36   DG CHEST PORT 1 VIEW  Result Date: 05/02/2020 CLINICAL DATA:  Shortness of breath. EXAM: PORTABLE CHEST 1 VIEW COMPARISON:  04/30/2020. FINDINGS: Mediastinum and hilar structures normal. Stable cardiomegaly. No pulmonary venous congestion. Mild bibasilar atelectasis/infiltrates. No pleural effusion or pneumothorax. IMPRESSION: 1. Stable cardiomegaly.  No pulmonary venous congestion. 2. Mild bibasilar atelectasis/infiltrates. Electronically Signed   By: Maisie Fus  Register   On: 05/02/2020 06:58   DG Chest Port 1 View  Result Date: 04/30/2020 CLINICAL DATA:  Weakness EXAM: PORTABLE CHEST 1 VIEW COMPARISON:  02/20/2020 FINDINGS: Cardiac shadow is enlarged but stable. Lungs are well aerated bilaterally. No focal infiltrate  or effusion is seen. No bony abnormality is noted. IMPRESSION: No active disease. Electronically Signed   By: Alcide Clever M.D.   On: 04/30/2020 12:20   DG Abdomen Acute W/Chest  Result Date: 04/28/2020 CLINICAL DATA:  Abdominal pain EXAM: DG ABDOMEN ACUTE WITH 1 VIEW CHEST COMPARISON:  04/28/2020 FINDINGS: Supine and upright frontal views of the abdomen as well as an upright frontal view of the chest are obtained. Cardiac silhouette is stable. No airspace disease. Small bilateral effusions. No pneumothorax. No bowel obstruction or ileus. Continued gaseous distention of the colon. Excreted contrast within the urinary bladder from previous CT. No free gas in the greater peritoneal sac. No abdominal masses or abnormal calcifications. IMPRESSION: 1. No evidence of bowel obstruction or ileus. 2. Stable gaseous distension of the sigmoid colon. 3. Small bilateral pleural effusions. Electronically Signed   By: Sharlet Salina M.D.   On: 04/28/2020 22:42   DG ABD ACUTE 2+V W 1V CHEST  Result Date:  04/28/2020 CLINICAL DATA:  Abdominal pain EXAM: DG ABDOMEN ACUTE WITH 1 VIEW CHEST COMPARISON:  CT 02/20/2020 FINDINGS: Lungs are clear. No pneumothorax or pleural effusion. Mild cardiomegaly is present. Pulmonary vascularity is normal. Normal abdominal gas pattern with mild gaseous distension of a redundant sigmoid colon, similar to that noted on prior CT examination. No free intraperitoneal gas. Underpenetration precludes evaluation of the visceral shadows. Cholecystectomy clips seen in the right upper quadrant. Phleboliths noted within the pelvis. IMPRESSION: Negative abdominal radiographs.  No acute cardiopulmonary disease. Electronically Signed   By: Helyn Numbers MD   On: 04/28/2020 06:23   ECHOCARDIOGRAM COMPLETE  Result Date: 05/01/2020    ECHOCARDIOGRAM REPORT   Patient Name:   ANDREAZ KALMBACH Date of Exam: 05/01/2020 Medical Rec #:  [id number]        Height:       73.0 in Accession #:    [id number]       Weight:       199.5 lb Date of Birth:  1929/06/18       BSA:          2.149 m Patient Age:    89 years         BP:           103/63 mmHg Patient Gender: M                HR:           101 bpm. [facility] Procedure: 2D Echo Indications:    Elevated Troponin  History:        Patient has prior history of Echocardiogram examinations, most                 recent 10/11/2019. Arrythmias:Atrial Fibrillation; Risk                 Factors:Former Smoker and Diabetes.  Sonographer:    Jeryl Columbia RDCS (AE) Referring Phys: [phone] Lamont Dowdy [facility] IMPRESSIONS  1. Severe global hypokinesis. The anteroseptal,anterior, anteroseptal walls and apex are akinetic. Marland Kitchen Left ventricular ejection fraction, by estimation, is 15%. The left ventricle has severely decreased function. The left ventricle demonstrates global hypokinesis. Left ventricular diastolic parameters are indeterminate.  2. Right ventricular systolic function is mildly reduced. The right ventricular size is mildly enlarged.  3. Left atrial  size was moderately dilated.  4. Right atrial size was moderately dilated.  5. The mitral valve is normal in structure. Mild mitral valve regurgitation. No evidence of  mitral stenosis.  6. Tricuspid valve regurgitation is moderate.  7. The aortic valve is tricuspid. There is mild calcification of the aortic valve. There is mild thickening of the aortic valve. Aortic valve regurgitation is not visualized. No aortic stenosis is present.  8. The inferior vena cava is normal in size with <50% respiratory variability, suggesting right atrial pressure of 8 mmHg. FINDINGS  Left Ventricle: Severe global hypokinesis. The anteroseptal,anterior, anteroseptal walls and apex are akinetic. Left ventricular ejection fraction, by estimation, is 15%. The left ventricle has severely decreased function. The left ventricle demonstrates global hypokinesis. The left ventricular internal cavity size was normal in size. There is no left ventricular hypertrophy. Left ventricular diastolic parameters are indeterminate. Right Ventricle: The right ventricular size is mildly enlarged. No increase in right ventricular wall thickness. Right ventricular systolic function is mildly reduced. Left Atrium: Left atrial size was moderately dilated. Right Atrium: Right atrial size was moderately dilated. Pericardium: There is no evidence of pericardial effusion. Mitral Valve: The mitral valve is normal in structure. Mild mitral valve regurgitation. No evidence of mitral valve stenosis. Tricuspid Valve: The tricuspid valve is normal in structure. Tricuspid valve regurgitation is moderate . No evidence of tricuspid stenosis. Aortic Valve: The aortic valve is tricuspid. There is mild calcification of the aortic valve. There is mild thickening of the aortic valve. There is mild aortic valve annular calcification. Aortic valve regurgitation is not visualized. No aortic stenosis  is present. Aortic valve mean gradient measures 1.2 mmHg. Aortic valve peak  gradient measures 2.3 mmHg. Aortic valve area, by VTI measures 2.07 cm. Pulmonic Valve: The pulmonic valve was not well visualized. Pulmonic valve regurgitation is not visualized. No evidence of pulmonic stenosis. Aorta: The aortic root is normal in size and structure. Pulmonary Artery: Moderate pulmonary HTN, PASP is 45 mmHg. Venous: The inferior vena cava is normal in size with less than 50% respiratory variability, suggesting right atrial pressure of 8 mmHg. IAS/Shunts: The interatrial septum was not well visualized.  LEFT VENTRICLE PLAX 2D LVIDd:         5.33 cm LVIDs:         4.94 cm LV PW:         1.09 cm LV IVS:        0.96 cm LVOT diam:     2.10 cm LV SV:         20 LV SV Index:   9 LVOT Area:     3.46 cm  RIGHT VENTRICLE RV S prime:     7.80 cm/s TAPSE (M-mode): 1.2 cm LEFT ATRIUM              Index       RIGHT ATRIUM           Index LA diam:        4.90 cm  2.28 cm/m  RA Area:     18.80 cm LA Vol (A2C):   123.0 ml 57.22 ml/m RA Volume:   55.90 ml  26.01 ml/m LA Vol (A4C):   76.5 ml  35.59 ml/m LA Biplane Vol: 97.9 ml  45.55 ml/m  AORTIC VALVE AV Area (Vmax):    1.70 cm AV Area (Vmean):   1.63 cm AV Area (VTI):     2.07 cm AV Vmax:           76.60 cm/s AV Vmean:          49.811 cm/s AV VTI:  0.096 m AV Peak Grad:      2.3 mmHg AV Mean Grad:      1.2 mmHg LVOT Vmax:         37.70 cm/s LVOT Vmean:        23.431 cm/s LVOT VTI:          0.058 m LVOT/AV VTI ratio: 0.60  AORTA Ao Root diam: 3.30 cm MITRAL VALVE                TRICUSPID VALVE MV Area (PHT): 4.10 cm     TR Peak grad:   38.2 mmHg MV Decel Time: 185 msec     TR Vmax:        309.00 cm/s MR Peak grad: 76.0 mmHg MR Mean grad: 42.0 mmHg     SHUNTS MR Vmax:      436.00 cm/s   Systemic VTI:  0.06 m MR Vmean:     298.0 cm/s    Systemic Diam: 2.10 cm MV E velocity: 107.00 cm/s MV A velocity: 92.50 cm/s MV E/A ratio:  1.16 Dina Rich MD Electronically signed by Dina Rich MD Signature Date/Time: 05/01/2020/4:25:50 PM    Final     US Abdomen Limited RUQ (LIVER/GB)  Result Date: 05/01/2020 CLINICAL DATA:  Elevated liver function tests for 1 week EXAM: ULTRASOUND ABDOMEN LIMITED RIGHT UPPER QUADRANT COMPARISON:  CT of the abdomen and pelvis of April 28, 2020 FINDINGS: Gallbladder: Post cholecystectomy Common bile duct: Diameter: 2.9 mm Liver: Mildly echogenic liver, mildly coarsened echotexture with suggestion of contour nodularity. No visible lesion on submitted images. Portal vein is patent on color Doppler imaging with normal direction of blood flow towards the liver. Other: RIGHT pleural effusion incidentally imaged. IMPRESSION: Possible hepatic steatosis and early liver disease based on hepatic morphology and echotexture. Post cholecystectomy without biliary duct dilation. RIGHT pleural effusion, partially visualized. Electronically Signed   By: Donzetta Kohut M.D.   On: 05/01/2020 09:30     CBC Recent Labs  Lab 04/28/20 0441 04/28/20 0441 04/28/20 2303 04/30/20 1136 05/01/20 0428 05/02/20 0455 05/03/20 0329  WBC 6.1   < > 8.2 5.7 6.7 6.7 8.6  HGB 12.0*   < > 11.7* 10.6* 10.9* 11.3* 10.5*  HCT 38.0*   < > 37.1* 33.1* 34.5* 35.8* 34.2*  PLT 232   < > 204 147* 136* 143* 104*  MCV 95.0   < > 96.6 94.0 95.0 95.2 95.8  MCH 30.0   < > 30.5 30.1 30.0 30.1 29.4  MCHC 31.6   < > 31.5 32.0 31.6 31.6 30.7  RDW 14.8   < > 15.3 15.6* 15.6* 15.8* 16.1*  LYMPHSABS 1.2  --  1.3 1.0  --   --   --   MONOABS 0.3  --  0.6 0.5  --   --   --   EOSABS 0.0  --  0.0 0.0  --   --   --   BASOSABS 0.0  --  0.0 0.0  --   --   --    < > = values in this interval not displayed.    Chemistries  Recent Labs  Lab 04/28/20 2303 04/28/20 2303 04/29/20 0703 04/30/20 1136 05/01/20 0428 05/02/20 0455 05/03/20 0329  NA 129*   < > 133* 134* 134* 135 134*  K 6.6*   < > 5.3* 3.7 3.7 4.0 4.4  CL 102   < > 104 104 106 105 102  CO2 13*   < > 14* 17*  17* 17* 15*  GLUCOSE 206*   < > 155* 213* 172* 139* 158*  BUN 54*   < > 57* 56* 58*  60* 72*  CREATININE 2.58*   < > 2.56* 2.21* 2.22* 2.32* 3.04*  CALCIUM 8.8*   < > 8.8* 8.8* 8.4* 8.7* 8.6*  MG  --   --   --   --  1.8  --   --   AST 672*  --   --  664* 448* 432* 1,613*  ALT 426*  --   --  660* 605* 629* 1,012*  ALKPHOS 131*  --   --  113 105 114 167*  BILITOT 2.3*  --   --  2.0* 1.9* 1.8* 2.2*   < > = values in this interval not displayed.   ------------------------------------------------------------------------------------------------------------------ No results for input(s): CHOL, HDL, LDLCALC, TRIG, CHOLHDL, LDLDIRECT in the last 72 hours.  Lab Results  Component Value Date   HGBA1C 7.8 (H) 04/30/2020   ------------------------------------------------------------------------------------------------------------------ No results for input(s): TSH, T4TOTAL, T3FREE, THYROIDAB in the last 72 hours.  Invalid input(s): FREET3 ------------------------------------------------------------------------------------------------------------------ No results for input(s): VITAMINB12, FOLATE, FERRITIN, TIBC, IRON, RETICCTPCT in the last 72 hours.  Coagulation profile No results for input(s): INR, PROTIME in the last 168 hours.  No results for input(s): DDIMER in the last 72 hours.  Cardiac Enzymes No results for input(s): CKMB, TROPONINI, MYOGLOBIN in the last 168 hours.  Invalid input(s): CK ------------------------------------------------------------------------------------------------------------------    Component Value Date/Time   BNP 2,068.0 (H) 05/02/2020 5993     Erick Blinks M.D on 05/03/2020 at 8:58 PM  Go to [email or web address] - for contact info  [facility] - Office  [phone]

## 2020-05-03 NOTE — Progress Notes (Signed)
Palliative consult received- chart reviewed- meeting with patient and family at 1pm.   Ocie Bob, AGNP-C Palliative Medicine  No charge note

## 2020-05-03 NOTE — Patient Outreach (Signed)
[facility]) Care Management  05/03/2020  Warren Mcdonald 1930-01-05 [id number]   Member scheduled for 4th outreach today, noted he is currently admitted to hospital for NSTEMI.  Hospital liaisons notified, this care manager will follow up with member/wife pending discharge.  Kemper Durie, [place], MSN [facility] Care Management  [facility] Manager [phone]

## 2020-05-03 NOTE — Consult Note (Signed)
Consultation Note Date: 05/03/2020   Patient Name: Warren Mcdonald  DOB: 07-06-1929  MRN: [id number]  Age / Sex: 86 y.o., male  PCP: Warren Halon, MD Referring Physician: Erick Blinks, MD  Reason for Consultation: Establishing goals of care  HPI/Patient Profile: 86 y.o. male  with past medical history of congestive heart failure, diabetes, A. fib, chronic kidney disease stage III, atrial fibrillation admitted on 04/28/2020 with persistent hypotension and work-up revealed NSTEMI with reduction in heart failure down to EF of 15%, he has had persistent A. fib with RVR, current admission complicated by cardiogenic shock which has resulted in acute on chronic kidney failure, and shock liver.  Palliative medicine consulted for goals of care.  Clinical Assessment and Goals of Care: I was able to meet at the bedside with patient's spouse Warren Mcdonald, 2 granddaughters who are more like children to him, and his oldest daughter. Warren Mcdonald is a pleasant, caring and loving gentleman, his family shared memories of how he valued caring for his family.  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  It is clear that his values lie and his spiritual faith, and in spending time with his family. We had discussions related to patient's current medical state.  And possible trajectories of care. Family expressed the desire to transition patient to full comfort and try and get him home as soon as possible as he faces his mortality.  They share that he will have 24-hour care at home.  One of his granddaughters is a Therapist, music, and many other family members work in the Science writer.  We discussed the meeting of transitioning to full comfort.  Additionally, discussion was had with Warren Mcdonald expressed that his main goals of care were to be at home.  CODE STATUS  was discussed-patient and family agree that a full code CPR and attempts at resuscitation would not meet his goals of being able to be at home with his family.  All are in agreement with a DO NOT RESUSCITATE order.  Primary Decision Maker PATIENT- with assistance of family members    SUMMARY OF RECOMMENDATIONS -DNR -Transition to full comfort -D/C IV fluids D/C IV medications D/C cardiac monitoring, and other medications and/or interventions that are not comfort focused -Start oxycodone liquid 5 mg p.o. every 2 hours as needed for pain and/or shortness of breath or air hunger, advise avoiding morphine due to patient's GFR is less than 30 -Other comfort medications as ordered -TOC referral for home with hospice as soon as possible   Code Status/Advance Care Planning:  DNR  Palliative Prophylaxis:   Delirium Protocol and Frequent Pain Assessment  Additional Recommendations (Limitations, Scope, Preferences):  Avoid Hospitalization, Full Comfort Care, Minimize Medications and No Diagnostics  Prognosis:    < 2 weeks  Discharge Planning: Home with Hospice  Primary Diagnoses: Present on Admission: . Hypotension . Atrial fibrillation with RVR (HCC) . NSTEMI (non-ST elevated myocardial infarction)--with cardiogenic shock . Atrial  fibrillation with rapid ventricular response (HCC) . Chronic combined systolic and diastolic congestive heart failure (HCC) . CKD (chronic kidney disease) stage 3, GFR 30-59 ml/min (HCC) . Acute on chronic [name] (heart failure with reduced ejection fraction) --worse due to cardiogenic shock in the setting of NSTEMI    I have reviewed the medical record, interviewed the patient and family, and examined the patient. The following aspects are pertinent.  Past Medical History:  Diagnosis Date  . Abdominal distension (gaseous)   . Anemia, unspecified   . Chronic diastolic (congestive) heart failure (HCC)   . Essential (primary) hypertension   .  Hyperlipidemia   . PAF (paroxysmal atrial fibrillation) (HCC)   . Peripheral vascular disease (HCC) 2011   Left above-knee popliteal to posterior tibial artery bypass   . PUD (peptic ulcer disease)   . Type 2 diabetes mellitus with diabetic peripheral angiopathy without gangrene [facility])    Social History   Socioeconomic History  . Marital status: Married    Spouse name: Not on file  . Number of children: Not on file  . Years of education: Not on file  . Highest education level: Not on file  Occupational History  . Not on file  Tobacco Use  . Smoking status: Former Smoker    Types: Cigars    Quit date: 05/27/1988    Years since quitting: 31.9  . Smokeless tobacco: Former Neurosurgeon    Types: Chew    Quit date: 05/27/1988  Vaping Use  . Vaping Use: Never used  Substance and Sexual Activity  . Alcohol use: No    Alcohol/week: 0.0 standard drinks  . Drug use: No  . Sexual activity: Not on file  Other Topics Concern  . Not on file  Social History Narrative  . Not on file   Social Determinants of Health   Financial Resource Strain:   . Difficulty of Paying Living Expenses: Not on file  Food Insecurity: No Food Insecurity  . Worried About Programme researcher, broadcasting/film/video in the Last Year: Never true  . Ran Out of Food in the Last Year: Never true  Transportation Needs: No Transportation Needs  . Lack of Transportation (Medical): No  . Lack of Transportation (Non-Medical): No  Physical Activity:   . Days of Exercise per Week: Not on file  . Minutes of Exercise per Session: Not on file  Stress:   . Feeling of Stress : Not on file  Social Connections:   . Frequency of Communication with Friends and Family: Not on file  . Frequency of Social Gatherings with Friends and Family: Not on file  . Attends Religious Services: Not on file  . Active Member of Clubs or Organizations: Not on file  . Attends Banker Meetings: Not on file  . Marital Status: Not on file   Scheduled Meds: .  Chlorhexidine Gluconate Cloth  6 each Topical Daily  . furosemide  60 mg Intravenous BID  . pantoprazole  40 mg Oral Daily  . senna-docusate  2 tablet Oral BID   Continuous Infusions: PRN Meds:.acetaminophen **OR** acetaminophen, ALPRAZolam, glycopyrrolate **OR** glycopyrrolate **OR** glycopyrrolate, haloperidol **OR** haloperidol **OR** haloperidol lactate, ondansetron **OR** ondansetron (ZOFRAN) IV, oxyCODONE **OR** oxyCODONE, polyvinyl alcohol Medications Prior to Admission:  Prior to Admission medications   Medication Sig Start Date End Date Taking? Authorizing Provider  apixaban (ELIQUIS) 2.5 MG TABS tablet Take 1 tablet (2.5 mg total) by mouth 2 (two) times daily. 03/27/20  Yes Dyann Kief, PA-C  atorvastatin (  LIPITOR) 20 MG tablet Take 1 tablet (20 mg total) by mouth daily at 6 PM. 03/07/20  Yes Allena Katz, Earlie Lou, MD  furosemide (LASIX) 40 MG tablet Take 40 mg by mouth daily.   Yes [provider]  pantoprazole (PROTONIX) 40 MG tablet Take 1 tablet (40 mg total) by mouth daily. 04/28/20  Yes Bethann Berkshire, MD   No Known Allergies  Physical Exam Vitals and nursing note reviewed.  Constitutional:      Comments: sleepy  Cardiovascular:     Rate and Rhythm: Normal rate.  Pulmonary:     Effort: Pulmonary effort is normal.  Skin:    General: Skin is warm and dry.  Neurological:     Mental Status: Mental status is at baseline.  Psychiatric:        Mood and Affect: Mood normal.        Behavior: Behavior normal.        Thought Content: Thought content normal.        Judgment: Judgment normal.     Vital Signs: BP 100/65   Pulse 94   Temp (!) 97.3 F (36.3 C) (Oral)   Resp (!) 23   Ht 6\' 1"  (1.854 m)   Wt 90.5 kg   SpO2 100%   BMI 26.32 kg/m  Pain Scale: 0-10   Pain Score: 0-No pain   SpO2: SpO2: 100 % O2 Device:SpO2: 100 % O2 Flow Rate: .O2 Flow Rate (L/min): 3 L/min  IO: Intake/output summary:   Intake/Output Summary (Last 24 hours) at 05/03/2020  1401 Last data filed at 05/03/2020 14/12/2019 Gross per 24 hour  Intake 344.6 ml  Output 650 ml  Net -305.4 ml    LBM: Last BM Date: 04/30/20 Baseline Weight: Weight: 83.3 kg Most recent weight: Weight: 90.5 kg     Palliative Assessment/Data: PPS: 30%     Thank you for this consult. Palliative medicine will continue to follow and assist as needed.   Time In: 1303 Time Out: 1414 Time Total: 74 mins Greater than 50%  of this time was spent counseling and coordinating care related to the above assessment and plan.  Signed by: 1304, AGNP-C Palliative Medicine    Please contact Palliative Medicine Team phone at [phone] for questions and concerns.  For individual provider: See [phone]

## 2020-05-03 NOTE — Progress Notes (Signed)
Progress Note  Patient Name: Warren Mcdonald Date of Encounter: 05/03/2020  [facility] Cardiologist: Dina Rich, MD   Subjective   Denies any chest pains, some ongoing SOB  Inpatient Medications    Scheduled Meds: . aspirin  81 mg Oral Q breakfast  . Chlorhexidine Gluconate Cloth  6 each Topical Daily  . enoxaparin (LOVENOX) injection  90 mg Subcutaneous Q24H  . insulin aspart  0-5 Units Subcutaneous QHS  . insulin aspart  0-9 Units Subcutaneous TID WC  . pantoprazole  40 mg Oral Daily  . senna-docusate  2 tablet Oral BID   Continuous Infusions: . amiodarone 30 mg/hr (05/03/20 0035)   PRN Meds: acetaminophen **OR** acetaminophen, ALPRAZolam, ondansetron **OR** ondansetron (ZOFRAN) IV   Vital Signs    Vitals:   05/03/20 0300 05/03/20 0500 05/03/20 0700 05/03/20 0802  BP: 106/76 116/81 110/64   Pulse: (!) 102 92 94 94  Resp: (!) 40 (!) 22 (!) 25 (!) 24  Temp:    (!) 97.5 F (36.4 C)  TempSrc:    Oral  SpO2: 99% 98% 100% 100%  Weight:      Height:        Intake/Output Summary (Last 24 hours) at 05/03/2020 0923 Last data filed at 05/03/2020 6579 Gross per 24 hour  Intake 344.6 ml  Output 650 ml  Net -305.4 ml   Last 3 Weights 05/01/2020 04/30/2020 04/28/2020  Weight (lbs) 199 lb 8.3 oz 183 lb 10.3 oz 215 lb  Weight (kg) 90.5 kg 83.3 kg 97.523 kg      Telemetry    Rate controlled afib - Personally Reviewed  ECG    n/a- Personally Reviewed  Physical Exam   GEN: No acute distress.   Neck: elevated JVD Cardiac: irreg Respiratory: mild crackles bases GI: Soft, nontender, non-distended  MS: No edema; No deformity. Neuro:  Nonfocal  Psych: Normal affect   Labs    High Sensitivity Troponin:   Recent Labs  Lab 04/29/20 1102 04/30/20 1311 04/30/20 2034 05/02/20 0455 05/02/20 1028  TROPONINIHS 838* 579* 913* 477* 464*      Chemistry Recent Labs  Lab 05/01/20 0428 05/02/20 0455 05/03/20 0329  NA 134* 135 134*  K 3.7 4.0 4.4  CL  106 105 102  CO2 17* 17* 15*  GLUCOSE 172* 139* 158*  BUN 58* 60* 72*  CREATININE 2.22* 2.32* 3.04*  CALCIUM 8.4* 8.7* 8.6*  PROT 6.9 6.9 6.9  ALBUMIN 3.6 3.6 3.5  AST 448* 432* 1,613*  ALT 605* 629* 1,012*  ALKPHOS 105 114 167*  BILITOT 1.9* 1.8* 2.2*  GFRNONAA 28* 26* 19*  ANIONGAP 11 13 17*     Hematology Recent Labs  Lab 05/01/20 0428 05/02/20 0455 05/03/20 0329  WBC 6.7 6.7 8.6  RBC 3.63* 3.76* 3.57*  HGB 10.9* 11.3* 10.5*  HCT 34.5* 35.8* 34.2*  MCV 95.0 95.2 95.8  MCH 30.0 30.1 29.4  MCHC 31.6 31.6 30.7  RDW 15.6* 15.8* 16.1*  PLT 136* 143* 104*    BNP Recent Labs  Lab 04/30/20 2034 05/02/20 0455  BNP 2,416.0* 2,068.0*     DDimer No results for input(s): DDIMER in the last 168 hours.   Radiology    DG CHEST PORT 1 VIEW  Result Date: 05/02/2020 CLINICAL DATA:  Shortness of breath. EXAM: PORTABLE CHEST 1 VIEW COMPARISON:  04/30/2020. FINDINGS: Mediastinum and hilar structures normal. Stable cardiomegaly. No pulmonary venous congestion. Mild bibasilar atelectasis/infiltrates. No pleural effusion or pneumothorax. IMPRESSION: 1. Stable cardiomegaly.  No pulmonary  venous congestion. 2. Mild bibasilar atelectasis/infiltrates. Electronically Signed   By: Maisie Fus  Register   On: 05/02/2020 06:58   ECHOCARDIOGRAM COMPLETE  Result Date: 05/01/2020    ECHOCARDIOGRAM REPORT   Patient Name:   Warren Mcdonald Date of Exam: 05/01/2020 Medical Rec #:  [id number]        Height:       73.0 in Accession #:    [id number]       Weight:       199.5 lb Date of Birth:  [birth year]       BSA:          2.149 m Patient Age:    86 years         BP:           103/63 mmHg Patient Gender: M                HR:           101 bpm. [facility] Procedure: 2D Echo Indications:    Elevated Troponin  History:        Patient has prior history of Echocardiogram examinations, most                 recent 10/11/2019. Arrythmias:Atrial Fibrillation; Risk                 Factors:Former Smoker  and Diabetes.  Sonographer:    Jeryl Columbia RDCS (AE) Referring Phys: [phone] Lamont Dowdy [facility] IMPRESSIONS  1. Severe global hypokinesis. The anteroseptal,anterior, anteroseptal walls and apex are akinetic. Marland Kitchen Left ventricular ejection fraction, by estimation, is 15%. The left ventricle has severely decreased function. The left ventricle demonstrates global hypokinesis. Left ventricular diastolic parameters are indeterminate.  2. Right ventricular systolic function is mildly reduced. The right ventricular size is mildly enlarged.  3. Left atrial size was moderately dilated.  4. Right atrial size was moderately dilated.  5. The mitral valve is normal in structure. Mild mitral valve regurgitation. No evidence of mitral stenosis.  6. Tricuspid valve regurgitation is moderate.  7. The aortic valve is tricuspid. There is mild calcification of the aortic valve. There is mild thickening of the aortic valve. Aortic valve regurgitation is not visualized. No aortic stenosis is present.  8. The inferior vena cava is normal in size with <50% respiratory variability, suggesting right atrial pressure of 8 mmHg. FINDINGS  Left Ventricle: Severe global hypokinesis. The anteroseptal,anterior, anteroseptal walls and apex are akinetic. Left ventricular ejection fraction, by estimation, is 15%. The left ventricle has severely decreased function. The left ventricle demonstrates global hypokinesis. The left ventricular internal cavity size was normal in size. There is no left ventricular hypertrophy. Left ventricular diastolic parameters are indeterminate. Right Ventricle: The right ventricular size is mildly enlarged. No increase in right ventricular wall thickness. Right ventricular systolic function is mildly reduced. Left Atrium: Left atrial size was moderately dilated. Right Atrium: Right atrial size was moderately dilated. Pericardium: There is no evidence of pericardial effusion. Mitral Valve: The mitral valve is normal in  structure. Mild mitral valve regurgitation. No evidence of mitral valve stenosis. Tricuspid Valve: The tricuspid valve is normal in structure. Tricuspid valve regurgitation is moderate . No evidence of tricuspid stenosis. Aortic Valve: The aortic valve is tricuspid. There is mild calcification of the aortic valve. There is mild thickening of the aortic valve. There is mild aortic valve annular calcification. Aortic valve regurgitation is not visualized. No aortic stenosis  is present. Aortic valve  mean gradient measures 1.2 mmHg. Aortic valve peak gradient measures 2.3 mmHg. Aortic valve area, by VTI measures 2.07 cm. Pulmonic Valve: The pulmonic valve was not well visualized. Pulmonic valve regurgitation is not visualized. No evidence of pulmonic stenosis. Aorta: The aortic root is normal in size and structure. Pulmonary Artery: Moderate pulmonary HTN, PASP is 45 mmHg. Venous: The inferior vena cava is normal in size with less than 50% respiratory variability, suggesting right atrial pressure of 8 mmHg. IAS/Shunts: The interatrial septum was not well visualized.  LEFT VENTRICLE PLAX 2D LVIDd:         5.33 cm LVIDs:         4.94 cm LV PW:         1.09 cm LV IVS:        0.96 cm LVOT diam:     2.10 cm LV SV:         20 LV SV Index:   9 LVOT Area:     3.46 cm  RIGHT VENTRICLE RV S prime:     7.80 cm/s TAPSE (M-mode): 1.2 cm LEFT ATRIUM              Index       RIGHT ATRIUM           Index LA diam:        4.90 cm  2.28 cm/m  RA Area:     18.80 cm LA Vol (A2C):   123.0 ml 57.22 ml/m RA Volume:   55.90 ml  26.01 ml/m LA Vol (A4C):   76.5 ml  35.59 ml/m LA Biplane Vol: 97.9 ml  45.55 ml/m  AORTIC VALVE AV Area (Vmax):    1.70 cm AV Area (Vmean):   1.63 cm AV Area (VTI):     2.07 cm AV Vmax:           76.60 cm/s AV Vmean:          49.811 cm/s AV VTI:            0.096 m AV Peak Grad:      2.3 mmHg AV Mean Grad:      1.2 mmHg LVOT Vmax:         37.70 cm/s LVOT Vmean:        23.431 cm/s LVOT VTI:          0.058 m  LVOT/AV VTI ratio: 0.60  AORTA Ao Root diam: 3.30 cm MITRAL VALVE                TRICUSPID VALVE MV Area (PHT): 4.10 cm     TR Peak grad:   38.2 mmHg MV Decel Time: 185 msec     TR Vmax:        309.00 cm/s MR Peak grad: 76.0 mmHg MR Mean grad: 42.0 mmHg     SHUNTS MR Vmax:      436.00 cm/s   Systemic VTI:  0.06 m MR Vmean:     298.0 cm/s    Systemic Diam: 2.10 cm MV E velocity: 107.00 cm/s MV A velocity: 92.50 cm/s MV E/A ratio:  1.16 Dina Rich MD Electronically signed by Dina Rich MD Signature Date/Time: 05/01/2020/4:25:50 PM    Final    US Abdomen Limited RUQ (LIVER/GB)  Result Date: 05/01/2020 CLINICAL DATA:  Elevated liver function tests for 1 week EXAM: ULTRASOUND ABDOMEN LIMITED RIGHT UPPER QUADRANT COMPARISON:  CT of the abdomen and pelvis of April 28, 2020 FINDINGS: Gallbladder: Post cholecystectomy Common bile duct:  Diameter: 2.9 mm Liver: Mildly echogenic liver, mildly coarsened echotexture with suggestion of contour nodularity. No visible lesion on submitted images. Portal vein is patent on color Doppler imaging with normal direction of blood flow towards the liver. Other: RIGHT pleural effusion incidentally imaged. IMPRESSION: Possible hepatic steatosis and early liver disease based on hepatic morphology and echotexture. Post cholecystectomy without biliary duct dilation. RIGHT pleural effusion, partially visualized. Electronically Signed   By: Donzetta Kohut M.D.   On: 05/01/2020 09:30    Cardiac Studies     Patient Profile     Warren Mcdonald is a 86 y.o. male with past medical history of chronic combined systolic and diastolic CHF (EF 64-33% by echo in 09/2019, at 15% by echo this admission), paroxysmal atrial fibrillation, PAD (s/p left AKA in 06/2019), HTN, HLD and Type 2 DM who is being seen today for the evaluation of NSTEMI at the request of Dr. Mariea Clonts.   Assessment & Plan  1. Acute on chronic systolic HF/Cardiogenic shock - 09/2019 echo LVEF 40-45% - 04/2020 echo  LVEF 15%, anteroseptal,anterior, apex akientic. Mild RV dysfunction - presented with volume overload, hypotensive with lactic acid 7.9. BNP 2400. Presented with AKI and elevated LFTs. - appears initialyl he was managed by IVFs, transiently on dobutamine by primary team.      - plan would depend on goal of care. If full treatment then would need central access placed (IJ or subclavian) for coox and CVP moniotoring and likely inotrope support, try to optimize volume status and renal function to consider possible cath. If palliative then would not pursue any further cardiac interventions. Not a candidate for long term advanced heart therapies, would only be inpatient to improve symptoms.   - for now IV lasix 60mg  bid x 2 doses - some tachy on dobutamine initialyl on admission, may retry now that he is on IV amio pending goals of care.   2. Elevated troponin/NSTEMI - in setting of CHF, hypotension - peak trop 913 and trending down. EKG afib, lateral ST depressions. New drop in LVEF and WMAs on echo.  - 04/2020 echo LVEF 15%, anteroseptal,anterior, apex akientic. Mild RV dysfunction - medical therapy limited by soft bps, statin on hold due to elevated LFTs. - with AKI, poor functional status, advanced age would be hesitant for cath.    3. Afib - on amio gtt. Rising LFTs likely more related to shock liver as opposed to amio. With low bp's and cardiogenic shock no great alternatives.     4. AKI on CKD - further uptrend in Cr overnight to 3 in setting of hypotension, low cardioutput - would need central access and possible inotrope support if patient is for full measures.    5. PAD  s/p Lt AKA  06/2019,  6. Elevated LFTs - LFTs are trending up further     Palliative care to see.  For questions or updates, please contact [facility] Please consult [email or web address] for contact info under        Signed, 07/2019, MD  05/03/2020, 9:23 AM

## 2020-05-03 NOTE — TOC Progression Note (Addendum)
Transition of Care [facility]) - Progression Note    Patient Details  Name: Warren Mcdonald MRN: [id number] Date of Birth: 17-Jun-1929  [facility]) CM/SW Contact  Salome Arnt, [place] Phone Number: 05/03/2020, 2:25 PM  Clinical Narrative:  Palliative consulted TOC for hospice referral. LCSW met with pt, pt's wife, daughter, and granddaughters at bedside. They request referral to [facility] and plan to take pt home with hospice. Family report they will need hospital bed, O2, bedside table, and BSC. Hospice notified. MD updated. Anticipate d/c tomorrow after equipment is delivered.      Expected Discharge Plan: [facility] Barriers to Discharge: Other (comment) (Just referred to hospice. Anticipate equipment delivery tomorrow.)  Expected Discharge Plan and Services Expected Discharge Plan: [place] In-house Referral: Clinical Social Work   Post Acute Care Choice: Resumption of Svcs/PTA Provider Living arrangements for the past 2 months: Single Family Home                                       Social Determinants of Health (SDOH) Interventions    Readmission Risk Interventions Readmission Risk Prevention Plan 05/01/2020  Transportation Screening Complete  HRI or [place] Complete  Social Work Consult for [place] Planning/Counseling Complete  Palliative Care Screening Not Applicable  Medication Review Press photographer) Complete  Some recent data might be hidden

## 2020-05-04 ENCOUNTER — Telehealth: Payer: Self-pay | Admitting: *Deleted

## 2020-05-04 ENCOUNTER — Other Ambulatory Visit: Payer: Self-pay | Admitting: *Deleted

## 2020-05-04 DIAGNOSIS — Z515 Encounter for palliative care: Secondary | ICD-10-CM

## 2020-05-04 DIAGNOSIS — N179 Acute kidney failure, unspecified: Secondary | ICD-10-CM

## 2020-05-04 LAB — CULTURE, BLOOD (ROUTINE X 2)
Culture: NO GROWTH
Culture: NO GROWTH
Special Requests: ADEQUATE

## 2020-05-04 MED ORDER — SENNOSIDES-DOCUSATE SODIUM 8.6-50 MG PO TABS: 2.0000 | ORAL_TABLET | Freq: Every evening | ORAL | 0 refills | Status: AC | PRN

## 2020-05-04 MED ORDER — FUROSEMIDE 40 MG PO TABS: 40.0000 mg | ORAL_TABLET | Freq: Every day | ORAL | 0 refills | Status: AC | PRN

## 2020-05-04 MED ORDER — OXYCODONE HCL 20 MG/ML PO CONC: 6.0000 mg | ORAL | 0 refills | Status: DC | PRN

## 2020-05-04 MED ORDER — OXYCODONE HCL 5 MG/5ML PO SOLN: 5.0000 mg | ORAL | 0 refills | Status: AC | PRN

## 2020-05-04 NOTE — Care Management Important Message (Signed)
Important Message  Patient Details  Name: Warren Mcdonald MRN: [id number] Date of Birth: [birth year]   Medicare Important Message Given:  Yes     Corey Harold 05/04/2020, 12:11 PM

## 2020-05-04 NOTE — Telephone Encounter (Signed)
[name] with [facility] called. Just wanted to know if Dr Allena Katz would be attending physician for pt doing home hospice. Dr patel agreed [name] notified

## 2020-05-04 NOTE — Progress Notes (Signed)
AVS left with patient and given to EMS. Pt transported home by EMS.

## 2020-05-04 NOTE — Patient Outreach (Signed)
[facility]) Care Management  05/04/2020  Warren Mcdonald 20-Oct-1929 [id number]   Notified by hospital liaison that member has transitioned to comfort measures, will discharge home today with hospice.  Will close case at this time.  Kemper Durie, [place], MSN [facility] Care Management  [place] Manager [phone]

## 2020-05-04 NOTE — Discharge Summary (Signed)
Physician Discharge Summary  Warren Mcdonald:[id number] DOB: Jun 04, 1929 DOA: 04/28/2020  PCP: Anabel Halon, MD  Admit date: 04/28/2020 Discharge date: 05/04/2020  Admitted From: Home Disposition: Home  Recommendations for Outpatient Follow-up:  1. Patient discharged home with hospice services for end-of-life care  Discharge Condition: Hospice CODE STATUS: DNR, comfort measures Diet recommendation: Regular diet for comfort  Brief/Interim Summary: 86 year old male with history of chronic systolic congestive heart failure, hypertension, atrial fibrillation, peripheral arterial disease status post left AKA, diabetes, chronic kidney disease stage IIIb, admitted to the hospital with decompensated heart failure, acute kidney injury.  He was found to be persistently hypotensive and was initially felt to be in cardiogenic shock.  He also had A. fib with RVR and CHF exacerbation.  Echocardiogram was performed and he was found to have an EF of 15%.  BNP elevated over 2000.  Troponin peaked at 939.  He was seen by cardiology and not felt to be a candidate for advanced heart failure treatments.  Conservative treatment was limited due to hypotension.  He is also had a significant worsening of renal function with worsening of creatinine from 1.78-3.04.  This was felt to be related to low cardiac output.  He also had worsening of liver function tests, thought to be related to ischemic injury.  Overall prognosis remains poor.  He was seen by palliative care after discussions with family, it was decided to transition to comfort measures.  Family has elected to take the patient home with hospice services.  Discharge Diagnoses:  Principal Problem:   NSTEMI (non-ST elevated myocardial infarction)--with cardiogenic shock Active Problems:   DM (diabetes mellitus) (HCC)   Atrial fibrillation with rapid ventricular response (HCC)   Chronic combined systolic and diastolic congestive heart failure (HCC)    CKD (chronic kidney disease) stage 3, GFR 30-59 ml/min (HCC)   Hypotension   Atrial fibrillation with RVR (HCC)   Acute on chronic [name] (heart failure with reduced ejection fraction) --worse due to cardiogenic shock in the setting of NSTEMI    Hospice care patient    Discharge Instructions  Discharge Instructions    Diet - low sodium heart healthy   Complete by: As directed    Increase activity slowly   Complete by: As directed    No wound care   Complete by: As directed      Allergies as of 05/04/2020   No Known Allergies     Medication List    STOP taking these medications   apixaban 2.5 MG Tabs tablet Commonly known as: ELIQUIS   atorvastatin 20 MG tablet Commonly known as: LIPITOR     TAKE these medications   furosemide 40 MG tablet Commonly known as: LASIX Take 1 tablet (40 mg total) by mouth daily as needed for edema. What changed:   when to take this  reasons to take this   oxyCODONE 5 MG/5ML solution Commonly known as: ROXICODONE Take 5 mLs (5 mg total) by mouth every 2 (two) hours as needed for severe pain (shortness of breath).   pantoprazole 40 MG tablet Commonly known as: PROTONIX Take 1 tablet (40 mg total) by mouth daily.   senna-docusate 8.6-50 MG tablet Commonly known as: Senokot-S Take 2 tablets by mouth at bedtime as needed for mild constipation.       No Known Allergies  Consultations:  Cardiology  Palliative care   Procedures/Studies: CT ABDOMEN PELVIS W CONTRAST  Result Date: 04/28/2020 CLINICAL DATA:  Mid abdominal pain and distension, previous  cholecystectomy and appendectomy EXAM: CT ABDOMEN AND PELVIS WITH CONTRAST TECHNIQUE: Multidetector CT imaging of the abdomen and pelvis was performed using the standard protocol following bolus administration of intravenous contrast. CONTRAST:  10mL OMNIPAQUE IOHEXOL 300 MG/ML  SOLN COMPARISON:  02/20/2020 FINDINGS: Lower chest: Slight increase in the small bilateral pleural effusions  with associated bibasilar compressive atelectasis. Heart is enlarged. No pericardial effusion. Distal thoracic aorta atherosclerotic. Degenerative changes of the lower thoracic spine. Hepatobiliary: Reflux of contrast into the IVC and hepatic veins suggesting right heart failure. No focal hepatic abnormality or intrahepatic biliary dilatation. Remote cholecystectomy. Pancreas: Unremarkable. No pancreatic ductal dilatation or surrounding inflammatory changes. Spleen: Normal in size without focal abnormality. Adrenals/Urinary Tract: Normal adrenal glands. Bilateral renal cysts again noted. No renal obstruction or hydronephrosis. No hydroureter or ureteral calculus. Bladder is collapsed accounting for wall prominence. Stomach/Bowel: Similar wall thickening of the duodenum within adjacent duodenal air-fluid level which may represent a diverticulum. Surrounding right upper quadrant strandy edema. Findings remain consistent with duodenitis. Difficult to exclude peptic ulcer disease. No free air. Similar trace strandy edema along the right pericolic gutter and free fluid. Small amount of dependent pelvic free fluid as before. Colon remains moderately air distended but the small bowel is decompressed. This remains nonspecific. Chronic colonic ileus/dysmotility could have this appearance. No focal fluid collection or abscess.  No hemorrhage or hematoma. Vascular/Lymphatic: Aorta atherosclerotic. Negative for aneurysm. Mesenteric and renal vasculature appear to remain patent. No veno-occlusive process. No bulky adenopathy. Reproductive: Marked prostate enlargement. Other: Small fat containing inguinal hernias bilaterally. Intact abdominal wall. No ventral hernia. Musculoskeletal: Bones are osteopenic. Degenerative changes again noted of the spine. No acute compression fracture. IMPRESSION: Similar circumferential duodenal wall thickening in the right upper quadrant with strandy edema and small amount of free fluid along the  right pericolic gutter into the pelvis. Again, duodenitis and/or peptic ulcer disease could have this appearance. Adjacent duodenal air-fluid level may represent a duodenal diverticulum. Slight increase in the small pleural effusions and bibasilar compressive atelectasis Cardiomegaly with CT evidence of right heart failure. Nonspecific marked colonic distension without obstruction. See above comment. Aortic Atherosclerosis (ICD10-I70.0). Electronically Signed   By: Judie Petit.  Shick M.D.   On: 04/28/2020 09:36   DG CHEST PORT 1 VIEW  Result Date: 05/02/2020 CLINICAL DATA:  Shortness of breath. EXAM: PORTABLE CHEST 1 VIEW COMPARISON:  04/30/2020. FINDINGS: Mediastinum and hilar structures normal. Stable cardiomegaly. No pulmonary venous congestion. Mild bibasilar atelectasis/infiltrates. No pleural effusion or pneumothorax. IMPRESSION: 1. Stable cardiomegaly.  No pulmonary venous congestion. 2. Mild bibasilar atelectasis/infiltrates. Electronically Signed   By: Maisie Fus  Register   On: 05/02/2020 06:58   DG Chest Port 1 View  Result Date: 04/30/2020 CLINICAL DATA:  Weakness EXAM: PORTABLE CHEST 1 VIEW COMPARISON:  02/20/2020 FINDINGS: Cardiac shadow is enlarged but stable. Lungs are well aerated bilaterally. No focal infiltrate or effusion is seen. No bony abnormality is noted. IMPRESSION: No active disease. Electronically Signed   By: Alcide Clever M.D.   On: 04/30/2020 12:20   DG Abdomen Acute W/Chest  Result Date: 04/28/2020 CLINICAL DATA:  Abdominal pain EXAM: DG ABDOMEN ACUTE WITH 1 VIEW CHEST COMPARISON:  04/28/2020 FINDINGS: Supine and upright frontal views of the abdomen as well as an upright frontal view of the chest are obtained. Cardiac silhouette is stable. No airspace disease. Small bilateral effusions. No pneumothorax. No bowel obstruction or ileus. Continued gaseous distention of the colon. Excreted contrast within the urinary bladder from previous CT. No free gas in the  greater peritoneal sac. No  abdominal masses or abnormal calcifications. IMPRESSION: 1. No evidence of bowel obstruction or ileus. 2. Stable gaseous distension of the sigmoid colon. 3. Small bilateral pleural effusions. Electronically Signed   By: Sharlet Salina M.D.   On: 04/28/2020 22:42   DG ABD ACUTE 2+V W 1V CHEST  Result Date: 04/28/2020 CLINICAL DATA:  Abdominal pain EXAM: DG ABDOMEN ACUTE WITH 1 VIEW CHEST COMPARISON:  CT 02/20/2020 FINDINGS: Lungs are clear. No pneumothorax or pleural effusion. Mild cardiomegaly is present. Pulmonary vascularity is normal. Normal abdominal gas pattern with mild gaseous distension of a redundant sigmoid colon, similar to that noted on prior CT examination. No free intraperitoneal gas. Underpenetration precludes evaluation of the visceral shadows. Cholecystectomy clips seen in the right upper quadrant. Phleboliths noted within the pelvis. IMPRESSION: Negative abdominal radiographs.  No acute cardiopulmonary disease. Electronically Signed   By: Helyn Numbers MD   On: 04/28/2020 06:23   ECHOCARDIOGRAM COMPLETE  Result Date: 05/01/2020    ECHOCARDIOGRAM REPORT   Patient Name:   Warren Mcdonald Date of Exam: 05/01/2020 Medical Rec #:  [id number]        Height:       73.0 in Accession #:    [id number]       Weight:       199.5 lb Date of Birth:  18-Aug-1929       BSA:          2.149 m Patient Age:    89 years         BP:           103/63 mmHg Patient Gender: M                HR:           101 bpm. [facility] Procedure: 2D Echo Indications:    Elevated Troponin  History:        Patient has prior history of Echocardiogram examinations, most                 recent 10/11/2019. Arrythmias:Atrial Fibrillation; Risk                 Factors:Former Smoker and Diabetes.  Sonographer:    Jeryl Columbia RDCS (AE) Referring Phys: [phone] [facility] IMPRESSIONS  1. Severe global hypokinesis. The anteroseptal,anterior, anteroseptal walls and apex are akinetic. Marland Kitchen Left ventricular ejection  fraction, by estimation, is 15%. The left ventricle has severely decreased function. The left ventricle demonstrates global hypokinesis. Left ventricular diastolic parameters are indeterminate.  2. Right ventricular systolic function is mildly reduced. The right ventricular size is mildly enlarged.  3. Left atrial size was moderately dilated.  4. Right atrial size was moderately dilated.  5. The mitral valve is normal in structure. Mild mitral valve regurgitation. No evidence of mitral stenosis.  6. Tricuspid valve regurgitation is moderate.  7. The aortic valve is tricuspid. There is mild calcification of the aortic valve. There is mild thickening of the aortic valve. Aortic valve regurgitation is not visualized. No aortic stenosis is present.  8. The inferior vena cava is normal in size with <50% respiratory variability, suggesting right atrial pressure of 8 mmHg. FINDINGS  Left Ventricle: Severe global hypokinesis. The anteroseptal,anterior, anteroseptal walls and apex are akinetic. Left ventricular ejection fraction, by estimation, is 15%. The left ventricle has severely decreased function. The left ventricle demonstrates global hypokinesis. The left ventricular internal cavity size was normal in size. There  is no left ventricular hypertrophy. Left ventricular diastolic parameters are indeterminate. Right Ventricle: The right ventricular size is mildly enlarged. No increase in right ventricular wall thickness. Right ventricular systolic function is mildly reduced. Left Atrium: Left atrial size was moderately dilated. Right Atrium: Right atrial size was moderately dilated. Pericardium: There is no evidence of pericardial effusion. Mitral Valve: The mitral valve is normal in structure. Mild mitral valve regurgitation. No evidence of mitral valve stenosis. Tricuspid Valve: The tricuspid valve is normal in structure. Tricuspid valve regurgitation is moderate . No evidence of tricuspid stenosis. Aortic Valve: The  aortic valve is tricuspid. There is mild calcification of the aortic valve. There is mild thickening of the aortic valve. There is mild aortic valve annular calcification. Aortic valve regurgitation is not visualized. No aortic stenosis  is present. Aortic valve mean gradient measures 1.2 mmHg. Aortic valve peak gradient measures 2.3 mmHg. Aortic valve area, by VTI measures 2.07 cm. Pulmonic Valve: The pulmonic valve was not well visualized. Pulmonic valve regurgitation is not visualized. No evidence of pulmonic stenosis. Aorta: The aortic root is normal in size and structure. Pulmonary Artery: Moderate pulmonary HTN, PASP is 45 mmHg. Venous: The inferior vena cava is normal in size with less than 50% respiratory variability, suggesting right atrial pressure of 8 mmHg. IAS/Shunts: The interatrial septum was not well visualized.  LEFT VENTRICLE PLAX 2D LVIDd:         5.33 cm LVIDs:         4.94 cm LV PW:         1.09 cm LV IVS:        0.96 cm LVOT diam:     2.10 cm LV SV:         20 LV SV Index:   9 LVOT Area:     3.46 cm  RIGHT VENTRICLE RV S prime:     7.80 cm/s TAPSE (M-mode): 1.2 cm LEFT ATRIUM              Index       RIGHT ATRIUM           Index LA diam:        4.90 cm  2.28 cm/m  RA Area:     18.80 cm LA Vol (A2C):   123.0 ml 57.22 ml/m RA Volume:   55.90 ml  26.01 ml/m LA Vol (A4C):   76.5 ml  35.59 ml/m LA Biplane Vol: 97.9 ml  45.55 ml/m  AORTIC VALVE AV Area (Vmax):    1.70 cm AV Area (Vmean):   1.63 cm AV Area (VTI):     2.07 cm AV Vmax:           76.60 cm/s AV Vmean:          49.811 cm/s AV VTI:            0.096 m AV Peak Grad:      2.3 mmHg AV Mean Grad:      1.2 mmHg LVOT Vmax:         37.70 cm/s LVOT Vmean:        23.431 cm/s LVOT VTI:          0.058 m LVOT/AV VTI ratio: 0.60  AORTA Ao Root diam: 3.30 cm MITRAL VALVE                TRICUSPID VALVE MV Area (PHT): 4.10 cm     TR Peak grad:   38.2 mmHg MV Decel Time: 185 msec  TR Vmax:        309.00 cm/s MR Peak grad: 76.0 mmHg MR Mean  grad: 42.0 mmHg     SHUNTS MR Vmax:      436.00 cm/s   Systemic VTI:  0.06 m MR Vmean:     298.0 cm/s    Systemic Diam: 2.10 cm MV E velocity: 107.00 cm/s MV A velocity: 92.50 cm/s MV E/A ratio:  1.16 Dina Rich MD Electronically signed by Dina Rich MD Signature Date/Time: 05/01/2020/4:25:50 PM    Final    US Abdomen Limited RUQ (LIVER/GB)  Result Date: 05/01/2020 CLINICAL DATA:  Elevated liver function tests for 1 week EXAM: ULTRASOUND ABDOMEN LIMITED RIGHT UPPER QUADRANT COMPARISON:  CT of the abdomen and pelvis of April 28, 2020 FINDINGS: Gallbladder: Post cholecystectomy Common bile duct: Diameter: 2.9 mm Liver: Mildly echogenic liver, mildly coarsened echotexture with suggestion of contour nodularity. No visible lesion on submitted images. Portal vein is patent on color Doppler imaging with normal direction of blood flow towards the liver. Other: RIGHT pleural effusion incidentally imaged. IMPRESSION: Possible hepatic steatosis and early liver disease based on hepatic morphology and echotexture. Post cholecystectomy without biliary duct dilation. RIGHT pleural effusion, partially visualized. Electronically Signed   By: Donzetta Kohut M.D.   On: 05/01/2020 09:30       Subjective: Patient appears comfortable, denies any shortness of breath  Discharge Exam: Vitals:   05/03/20 2300 05/04/20 0000 05/04/20 0600 05/04/20 0800  BP:      Pulse: (!) 103 (!) 101 100 (!) 102  Resp: (!) 27 11 11 20   Temp:      TempSrc:      SpO2: 100% 94% 98% 100%  Weight:      Height:        General: Pt is alert, awake, not in acute distress Cardiovascular: RRR, S1/S2 +, no rubs, no gallops Respiratory: CTA bilaterally, no wheezing, no rhonchi Abdominal: Soft, NT, ND, bowel sounds + Extremities: no edema, no cyanosis    The results of significant diagnostics from this hospitalization (including imaging, microbiology, ancillary and laboratory) are listed below for reference.      Microbiology: Recent Results (from the past 240 hour(s))  Resp Panel by RT-PCR (Flu A&B, Covid) Nasopharyngeal Swab     Status: None   Collection Time: 04/28/20 10:59 PM   Specimen: Nasopharyngeal Swab; Nasopharyngeal(NP) swabs in vial transport medium  Result Value Ref Range Status   SARS Coronavirus 2 by RT PCR NEGATIVE NEGATIVE Final    Comment: (NOTE) SARS-CoV-2 target nucleic acids are NOT DETECTED.  The SARS-CoV-2 RNA is generally detectable in upper respiratory specimens during the acute phase of infection. The lowest concentration of SARS-CoV-2 viral copies this assay can detect is 138 copies/mL. A negative result does not preclude SARS-Cov-2 infection and should not be used as the sole basis for treatment or other patient management decisions. A negative result may occur with  improper specimen collection/handling, submission of specimen other than nasopharyngeal swab, presence of viral mutation(s) within the areas targeted by this assay, and inadequate number of viral copies(<138 copies/mL). A negative result must be combined with clinical observations, patient history, and epidemiological information. The expected result is Negative.  Fact Sheet for Patients:  BloggerCourse.com  Fact Sheet for Healthcare Providers:  SeriousBroker.it  This test is no t yet approved or cleared by the [place] FDA and  has been authorized for detection and/or diagnosis of SARS-CoV-2 by FDA under an Emergency Use Authorization (EUA). This EUA will  remain  in effect (meaning this test can be used) for the duration of the COVID-19 declaration under Section 564(b)(1) of the Act, 21 U.S.C.section 360bbb-3(b)(1), unless the authorization is terminated  or revoked sooner.       Influenza A by PCR NEGATIVE NEGATIVE Final   Influenza B by PCR NEGATIVE NEGATIVE Final    Comment: (NOTE) The Xpert Xpress SARS-CoV-2/FLU/RSV plus assay is  intended as an aid in the diagnosis of influenza from Nasopharyngeal swab specimens and should not be used as a sole basis for treatment. Nasal washings and aspirates are unacceptable for Xpert Xpress SARS-CoV-2/FLU/RSV testing.  Fact Sheet for Patients: BloggerCourse.com  Fact Sheet for Healthcare Providers: SeriousBroker.it  This test is not yet approved or cleared by the [place] FDA and has been authorized for detection and/or diagnosis of SARS-CoV-2 by FDA under an Emergency Use Authorization (EUA). This EUA will remain in effect (meaning this test can be used) for the duration of the COVID-19 declaration under Section 564(b)(1) of the Act, 21 U.S.C. section 360bbb-3(b)(1), unless the authorization is terminated or revoked.  Performed at [facility], [address], [place], [place] [address]   Culture, blood (routine x 2)     Status: None   Collection Time: 04/29/20 12:58 AM   Specimen: BLOOD LEFT HAND  Result Value Ref Range Status   Specimen Description BLOOD LEFT HAND  Final   Special Requests   Final    BOTTLES DRAWN AEROBIC ONLY Blood Culture results may not be optimal due to an inadequate volume of blood received in culture bottles   Culture   Final    NO GROWTH 5 DAYS Performed at [facility], [address] [place], [place] [address]    Report Status 05/04/2020 FINAL  Final  Culture, blood (routine x 2)     Status: None   Collection Time: 04/29/20  1:06 AM   Specimen: Left Antecubital; Blood  Result Value Ref Range Status   Specimen Description LEFT ANTECUBITAL  Final   Special Requests   Final    BOTTLES DRAWN AEROBIC ONLY Blood Culture adequate volume   Culture   Final    NO GROWTH 5 DAYS Performed at [facility], [address], [place], [place] [address]    Report Status 05/04/2020 FINAL  Final  MRSA PCR Screening     Status: None   Collection Time: 04/30/20  5:07 PM   Specimen: Nasopharyngeal   Result Value Ref Range Status   MRSA by PCR NEGATIVE NEGATIVE Final    Comment:        The GeneXpert MRSA Assay (FDA approved for NASAL specimens only), is one component of a comprehensive MRSA colonization surveillance program. It is not intended to diagnose MRSA infection nor to guide or monitor treatment for MRSA infections. Performed at [facility], [address] [place], [place] [address]      Labs: BNP (last 3 results) Recent Labs    02/20/20 1613 04/30/20 2034 05/02/20 0455  BNP 1,811.0* 2,416.0* 2,068.0*   Basic Metabolic Panel: Recent Labs  Lab 04/29/20 0703 04/30/20 1136 05/01/20 0428 05/02/20 0455 05/03/20 0329  NA 133* 134* 134* 135 134*  K 5.3* 3.7 3.7 4.0 4.4  CL 104 104 106 105 102  CO2 14* 17* 17* 17* 15*  GLUCOSE 155* 213* 172* 139* 158*  BUN 57* 56* 58* 60* 72*  CREATININE 2.56* 2.21* 2.22* 2.32* 3.04*  CALCIUM 8.8* 8.8* 8.4* 8.7* 8.6*  MG  --   --  1.8  --   --    Liver Function Tests: Recent Labs  Lab 04/28/20 2303 04/30/20 1136 05/01/20 0428 05/02/20 0455 05/03/20 0329  AST 672* 664* 448* 432* 1,613*  ALT 426* 660* 605* 629* 1,012*  ALKPHOS 131* 113 105 114 167*  BILITOT 2.3* 2.0* 1.9* 1.8* 2.2*  PROT 7.9 7.2 6.9 6.9 6.9  ALBUMIN 3.9 3.8 3.6 3.6 3.5   Recent Labs  Lab 04/28/20 0441  LIPASE 26   No results for input(s): AMMONIA in the last 168 hours. CBC: Recent Labs  Lab 04/28/20 0441 04/28/20 2303 04/30/20 1136 05/01/20 0428 05/02/20 0455 05/03/20 0329  WBC 6.1 8.2 5.7 6.7 6.7 8.6  NEUTROABS 4.5 6.1 4.2  --   --   --   HGB 12.0* 11.7* 10.6* 10.9* 11.3* 10.5*  HCT 38.0* 37.1* 33.1* 34.5* 35.8* 34.2*  MCV 95.0 96.6 94.0 95.0 95.2 95.8  PLT 232 204 147* 136* 143* 104*   Cardiac Enzymes: No results for input(s): CKTOTAL, CKMB, CKMBINDEX, TROPONINI in the last 168 hours. BNP: Invalid input(s): POCBNP CBG: Recent Labs  Lab 05/02/20 1128 05/02/20 1619 05/02/20 2056 05/03/20 0802 05/03/20 1112  GLUCAP 139*  110* 152* 135* 147*   D-Dimer No results for input(s): DDIMER in the last 72 hours. Hgb A1c No results for input(s): HGBA1C in the last 72 hours. Lipid Profile No results for input(s): CHOL, HDL, LDLCALC, TRIG, CHOLHDL, LDLDIRECT in the last 72 hours. Thyroid function studies No results for input(s): TSH, T4TOTAL, T3FREE, THYROIDAB in the last 72 hours.  Invalid input(s): FREET3 Anemia work up No results for input(s): VITAMINB12, FOLATE, FERRITIN, TIBC, IRON, RETICCTPCT in the last 72 hours. Urinalysis    Component Value Date/Time   COLORURINE YELLOW 04/28/2020 0459   APPEARANCEUR HAZY (A) 04/28/2020 0459   LABSPEC 1.019 04/28/2020 0459   PHURINE 5.0 04/28/2020 0459   GLUCOSEU NEGATIVE 04/28/2020 0459   HGBUR SMALL (A) 04/28/2020 0459   BILIRUBINUR NEGATIVE 04/28/2020 0459   KETONESUR NEGATIVE 04/28/2020 0459   PROTEINUR 100 (A) 04/28/2020 0459   UROBILINOGEN 1.0 09/03/2014 0930   NITRITE NEGATIVE 04/28/2020 0459   LEUKOCYTESUR NEGATIVE 04/28/2020 0459   Sepsis Labs Invalid input(s): PROCALCITONIN,  WBC,  LACTICIDVEN Microbiology Recent Results (from the past 240 hour(s))  Resp Panel by RT-PCR (Flu A&B, Covid) Nasopharyngeal Swab     Status: None   Collection Time: 04/28/20 10:59 PM   Specimen: Nasopharyngeal Swab; Nasopharyngeal(NP) swabs in vial transport medium  Result Value Ref Range Status   SARS Coronavirus 2 by RT PCR NEGATIVE NEGATIVE Final    Comment: (NOTE) SARS-CoV-2 target nucleic acids are NOT DETECTED.  The SARS-CoV-2 RNA is generally detectable in upper respiratory specimens during the acute phase of infection. The lowest concentration of SARS-CoV-2 viral copies this assay can detect is 138 copies/mL. A negative result does not preclude SARS-Cov-2 infection and should not be used as the sole basis for treatment or other patient management decisions. A negative result may occur with  improper specimen collection/handling, submission of specimen  other than nasopharyngeal swab, presence of viral mutation(s) within the areas targeted by this assay, and inadequate number of viral copies(<138 copies/mL). A negative result must be combined with clinical observations, patient history, and epidemiological information. The expected result is Negative.  Fact Sheet for Patients:  BloggerCourse.com  Fact Sheet for Healthcare Providers:  SeriousBroker.it  This test is no t yet approved or cleared by the [place] FDA and  has been authorized for detection and/or diagnosis of  SARS-CoV-2 by FDA under an Emergency Use Authorization (EUA). This EUA will remain  in effect (meaning this test can be used) for the duration of the COVID-19 declaration under Section 564(b)(1) of the Act, 21 U.S.C.section 360bbb-3(b)(1), unless the authorization is terminated  or revoked sooner.       Influenza A by PCR NEGATIVE NEGATIVE Final   Influenza B by PCR NEGATIVE NEGATIVE Final    Comment: (NOTE) The Xpert Xpress SARS-CoV-2/FLU/RSV plus assay is intended as an aid in the diagnosis of influenza from Nasopharyngeal swab specimens and should not be used as a sole basis for treatment. Nasal washings and aspirates are unacceptable for Xpert Xpress SARS-CoV-2/FLU/RSV testing.  Fact Sheet for Patients: BloggerCourse.com  Fact Sheet for Healthcare Providers: SeriousBroker.it  This test is not yet approved or cleared by the [place] FDA and has been authorized for detection and/or diagnosis of SARS-CoV-2 by FDA under an Emergency Use Authorization (EUA). This EUA will remain in effect (meaning this test can be used) for the duration of the COVID-19 declaration under Section 564(b)(1) of the Act, 21 U.S.C. section 360bbb-3(b)(1), unless the authorization is terminated or revoked.  Performed at [facility], [address] [place], [place]  [address]   Culture, blood (routine x 2)     Status: None   Collection Time: 04/29/20 12:58 AM   Specimen: BLOOD LEFT HAND  Result Value Ref Range Status   Specimen Description BLOOD LEFT HAND  Final   Special Requests   Final    BOTTLES DRAWN AEROBIC ONLY Blood Culture results may not be optimal due to an inadequate volume of blood received in culture bottles   Culture   Final    NO GROWTH 5 DAYS Performed at [facility], [address] [place], [place] [address]    Report Status 05/04/2020 FINAL  Final  Culture, blood (routine x 2)     Status: None   Collection Time: 04/29/20  1:06 AM   Specimen: Left Antecubital; Blood  Result Value Ref Range Status   Specimen Description LEFT ANTECUBITAL  Final   Special Requests   Final    BOTTLES DRAWN AEROBIC ONLY Blood Culture adequate volume   Culture   Final    NO GROWTH 5 DAYS Performed at [facility], [address] [place], [place] [address]    Report Status 05/04/2020 FINAL  Final  MRSA PCR Screening     Status: None   Collection Time: 04/30/20  5:07 PM   Specimen: Nasopharyngeal  Result Value Ref Range Status   MRSA by PCR NEGATIVE NEGATIVE Final    Comment:        The GeneXpert MRSA Assay (FDA approved for NASAL specimens only), is one component of a comprehensive MRSA colonization surveillance program. It is not intended to diagnose MRSA infection nor to guide or monitor treatment for MRSA infections. Performed at [facility], [address], [place], [place] [address]      Time coordinating discharge:  SIGNED:   Erick Blinks, MD  [facility] 05/04/2020, 8:59 PM   If 7PM-7AM, please contact night-coverage [email or web address]

## 2020-05-04 NOTE — Progress Notes (Signed)
Palliative care note reviewed, patient is transitioning to comfort care. Cardiology will sign off inpatient care.   Dina Rich MD

## 2020-05-04 NOTE — Progress Notes (Signed)
Daily Progress Note   Patient Name: Warren Mcdonald       Date: 05/04/2020 DOB: September 01, 1929  Age: 86 y.o. MRN#: [id number] Attending Physician: Erick Blinks, MD Primary Care Physician: Anabel Halon, MD Admit Date: 04/28/2020  Reason for Consultation/Follow-up: Establishing goals of care  Subjective: Patient resting comfortably in bed. Family member at bedside has no questions.  Per nursing plan is to discharge home today with Hospice support.    Length of Stay: 5  Current Medications: Scheduled Meds:  . Chlorhexidine Gluconate Cloth  6 each Topical Daily  . pantoprazole  40 mg Oral Daily  . senna-docusate  2 tablet Oral BID    Continuous Infusions:   PRN Meds: acetaminophen **OR** acetaminophen, ALPRAZolam, glycopyrrolate **OR** glycopyrrolate **OR** glycopyrrolate, haloperidol **OR** haloperidol **OR** haloperidol lactate, ondansetron **OR** ondansetron (ZOFRAN) IV, oxyCODONE **OR** oxyCODONE, polyvinyl alcohol      Vital Signs: BP 95/74   Pulse (!) 102   Temp (!) 97.3 F (36.3 C) (Oral)   Resp 20   Ht 6\' 1"  (1.854 m)   Wt 90.5 kg   SpO2 100%   BMI 26.32 kg/m  SpO2: SpO2: 100 % O2 Device: O2 Device: Room Air O2 Flow Rate: O2 Flow Rate (L/min): 3 L/min  Intake/output summary:   Intake/Output Summary (Last 24 hours) at 05/04/2020 1219 Last data filed at 05/04/2020 0600 Gross per 24 hour  Intake -  Output 550 ml  Net -550 ml   LBM: Last BM Date: 05/04/20 Baseline Weight: Weight: 83.3 kg Most recent weight: Weight: 90.5 kg       Palliative Assessment/Data: PPS: 20%      Patient Active Problem List   Diagnosis Date Noted  . Hospice care patient 05/04/2020  . NSTEMI (non-ST elevated myocardial infarction)--with cardiogenic shock 05/01/2020  . Acute  on chronic [name] (heart failure with reduced ejection fraction) --worse due to cardiogenic shock in the setting of NSTEMI  05/01/2020  . Atrial fibrillation with RVR (HCC) 04/30/2020  . Hypotension 04/29/2020  . Encounter to establish care 03/07/2020  . Constipation 03/07/2020  . CKD (chronic kidney disease) stage 3, GFR 30-59 ml/min (HCC) 03/07/2020  . [name] (heart failure with reduced ejection fraction) (HCC) 02/20/2020  . S/P AKA (above knee amputation) (HCC) 08/09/2019  . PAF (paroxysmal atrial fibrillation) (HCC)   . Nonhealing  surgical wound 06/25/2019  . Essential (primary) hypertension   . Hyperlipidemia, unspecified   . Type 2 diabetes mellitus with diabetic peripheral angiopathy without gangrene (HCC)   . Chronic combined systolic and diastolic congestive heart failure (HCC)   . Acute diastolic heart failure (HCC) 09/12/2014  . Cholecystitis, acute 09/06/2014  . Atrial fibrillation with rapid ventricular response (HCC) 09/06/2014  . 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 Abdominal pain 09/02/2014  . DM (diabetes mellitus) (HCC) 09/02/2014  . Pain in limb 12/27/2013  . Aftercare following surgery of the circulatory system, NEC 06/28/2013  . Atherosclerosis of native artery of extremity with intermittent claudication (HCC) 06/28/2013  . Peripheral vascular disease (HCC) 12/28/2012  . PAD (peripheral artery disease) (HCC) 12/23/2011    Palliative Care Assessment & Plan   Patient Profile: 86 y.o. male  with past medical history of congestive heart failure, diabetes, A. fib, chronic kidney disease stage III, atrial fibrillation admitted on 04/28/2020 with persistent hypotension and work-up revealed NSTEMI with reduction in heart failure down to EF of 15%, he has had persistent A. fib with RVR, current admission complicated by cardiogenic shock which has resulted in acute on chronic kidney failure, and shock liver.  Palliative medicine consulted for goals of  care.   Assessment/Recommendations/Plan   Continue current comfort interventions  Plan for d/c home today with Hospice  Goals of Care and Additional Recommendations:  Limitations on Scope of Treatment: Full Comfort Care  Code Status:  DNR  Prognosis:   < 4 weeks  Discharge Planning:  To Be Determined  Care plan was discussed with patient's family member and nurse.  Thank you for allowing the Palliative Medicine Team to assist in the care of this patient.   Total time- 27 mins Greater than 50%  of this time was spent counseling and coordinating care related to the above assessment and plan.  Ocie Bob, AGNP-C Palliative Medicine   Please contact Palliative Medicine Team phone at [phone] for questions and concerns.

## 2020-05-04 NOTE — TOC Transition Note (Signed)
Transition of Care [facility]) - CM/SW Discharge Note   Patient Details  Name: Warren Mcdonald MRN: [id number] Date of Birth: [birth year]  [facility] CM/SW Contact:  Barry Brunner, LCSW Phone Number: 05/04/2020, 12:29 PM   Clinical Narrative:    CSW notified [name] with [facility] and family of patient's readiness for discharge. CSW verified that equipment had been delivered. CSW completed med transport form and called EMS. TOC signing off.    Final next level of care: Home w Hospice Care Barriers to Discharge: Barriers Resolved   Patient Goals and CMS Choice Patient states their goals for this hospitalization and ongoing recovery are:: return home with hospice CMS Medicare.gov Compare Post Acute Care list provided to:: Patient Choice offered to / list presented to : Patient  Discharge Placement                Patient to be transferred to facility by: [facility] Name of family member notified: Deniece Ree Patient and family notified of of transfer: 05/04/20  Discharge Plan and Services In-house Referral: Clinical Social Work   Post Acute Care Choice: Resumption of Svcs/PTA Provider                               Social Determinants of Health (SDOH) Interventions     Readmission Risk Interventions Readmission Risk Prevention Plan 05/01/2020  Transportation Screening Complete  HRI or Home Care Consult Complete  Social Work Consult for Recovery Care Planning/Counseling Complete  Palliative Care Screening Not Applicable  Medication Review [employer]) Complete  Some recent data might be hidden

## 2020-05-05 ENCOUNTER — Telehealth: Payer: Self-pay | Admitting: Cardiology

## 2020-05-05 NOTE — Telephone Encounter (Signed)
New message    Patient has been put in Hospice and they stopped his Eliquis, can he start taking a 81 mg baby aspirin for circulation issues?

## 2020-05-08 MED ORDER — ASPIRIN EC 81 MG PO TBEC: 81.0000 mg | DELAYED_RELEASE_TABLET | Freq: Every day | ORAL | 3 refills | Status: AC

## 2020-05-08 NOTE — Telephone Encounter (Signed)
I  Spoke with Deniece Ree and told her Dr.Branch agrees with ASA 81 mg daily.

## 2020-05-08 NOTE — Telephone Encounter (Signed)
Ok to start aspirin 81mg  daily for him  MD

## 2020-05-30 ENCOUNTER — Telehealth: Payer: Self-pay | Admitting: *Deleted

## 2020-05-30 NOTE — Telephone Encounter (Signed)
Warren Mcdonald with [facility] called wanting to let you know that pt passed away Jun 15, 2019 at 4pm. The family is doing ok and if you have any questions you can call

## 2020-06-16 ENCOUNTER — Ambulatory Visit: Payer: Medicare Other | Admitting: Student

## 2020-06-27 DEATH — deceased

## 2020-08-25 ENCOUNTER — Ambulatory Visit: Payer: Medicare Other | Admitting: Cardiology

## 2022-05-14 IMAGING — DX DG ABDOMEN ACUTE W/ 1V CHEST
5 series · 5 of 5 positions shown · non-contrast
Comparison: 04/28/2020

CLINICAL DATA: Abdominal pain

EXAM:
DG ABDOMEN ACUTE WITH 1 VIEW CHEST

[chest pa]
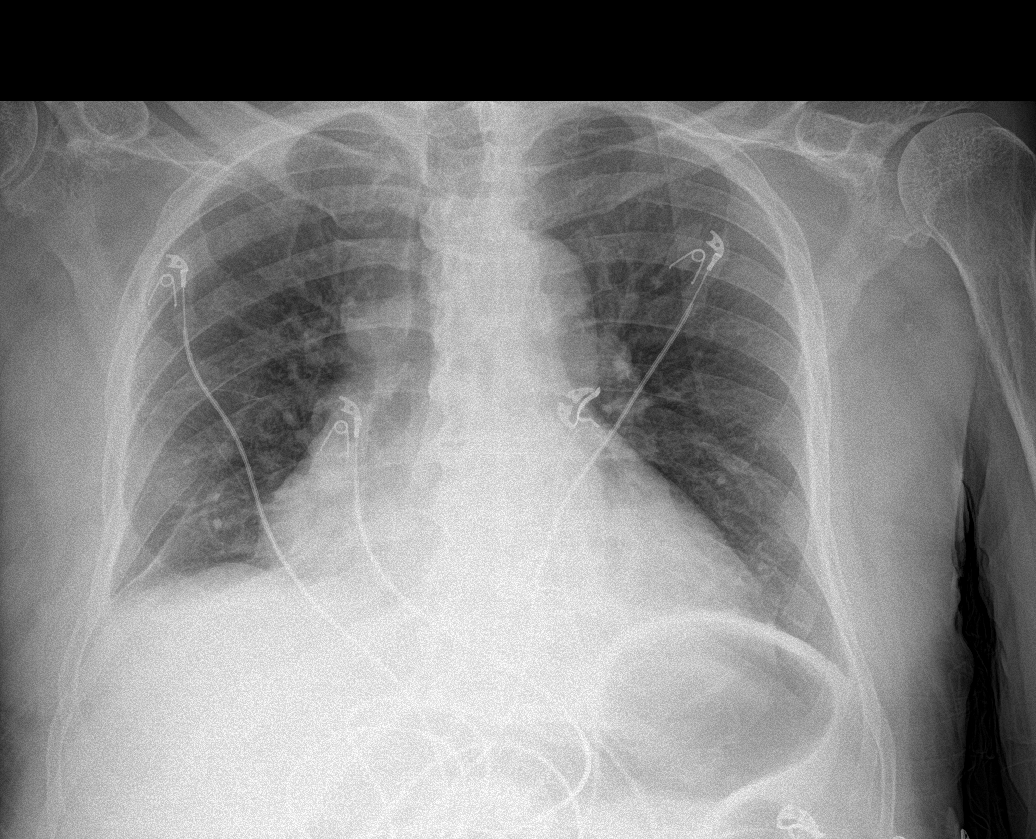

[abdomen erect]
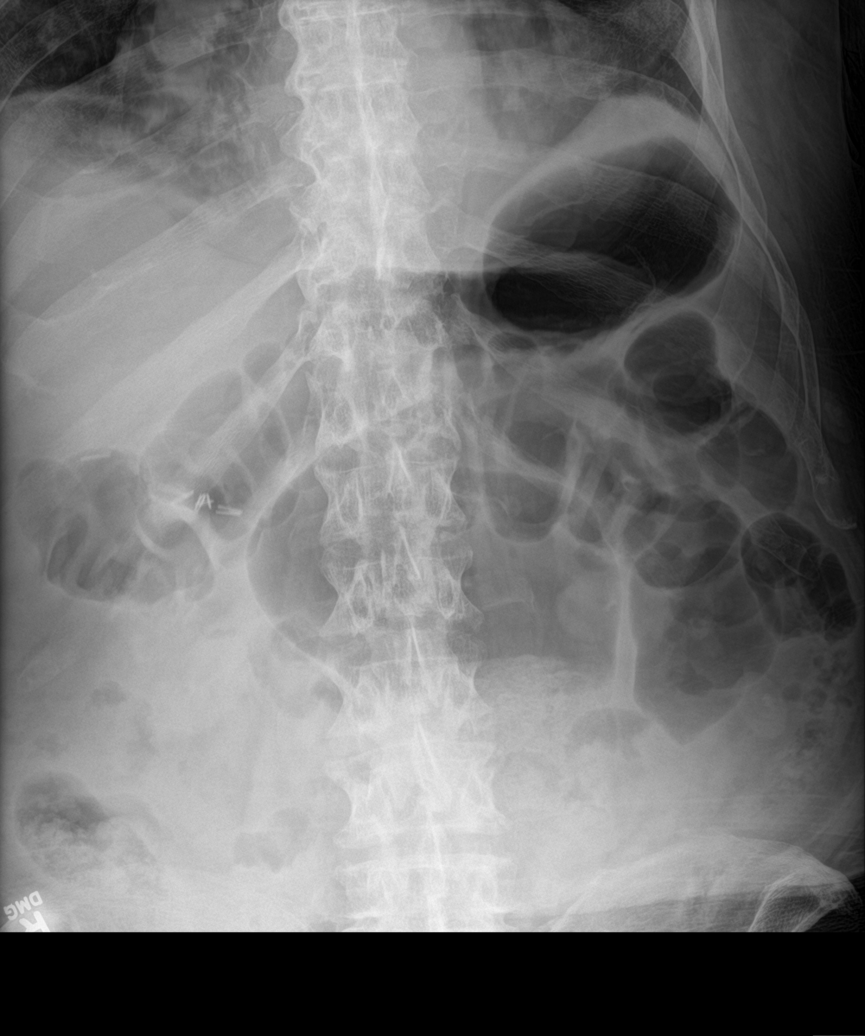

[abdomen supine (1 of 3)]
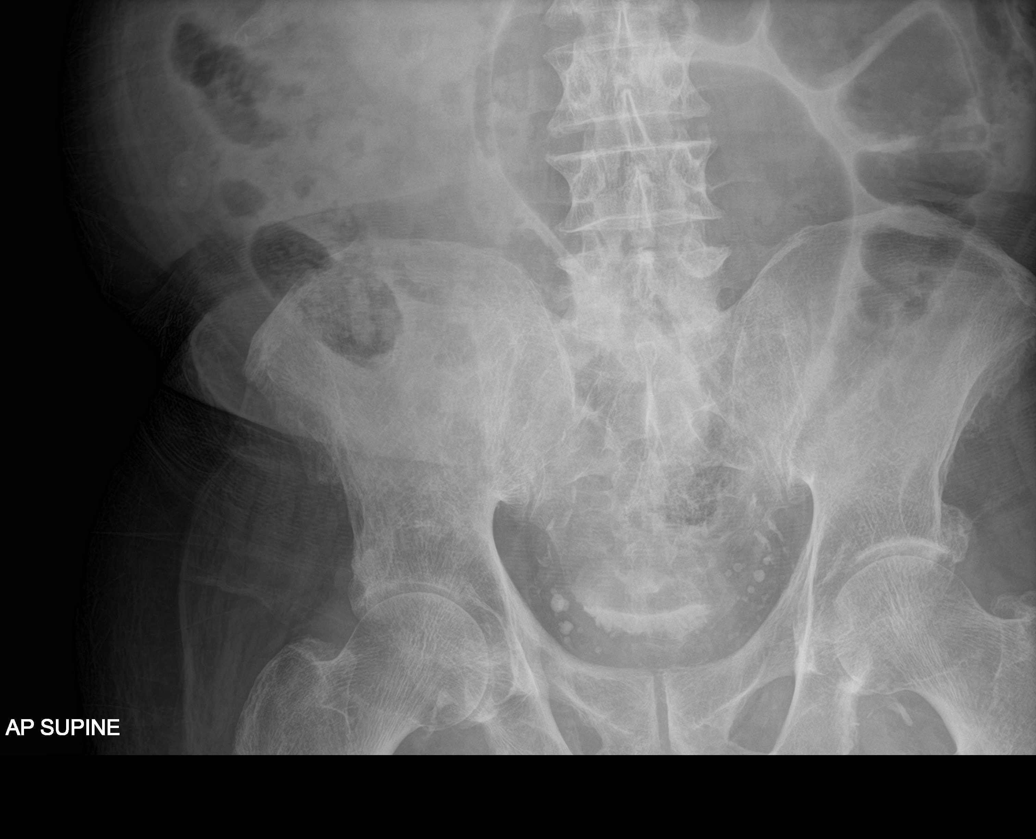

[abdomen supine (2 of 3)]
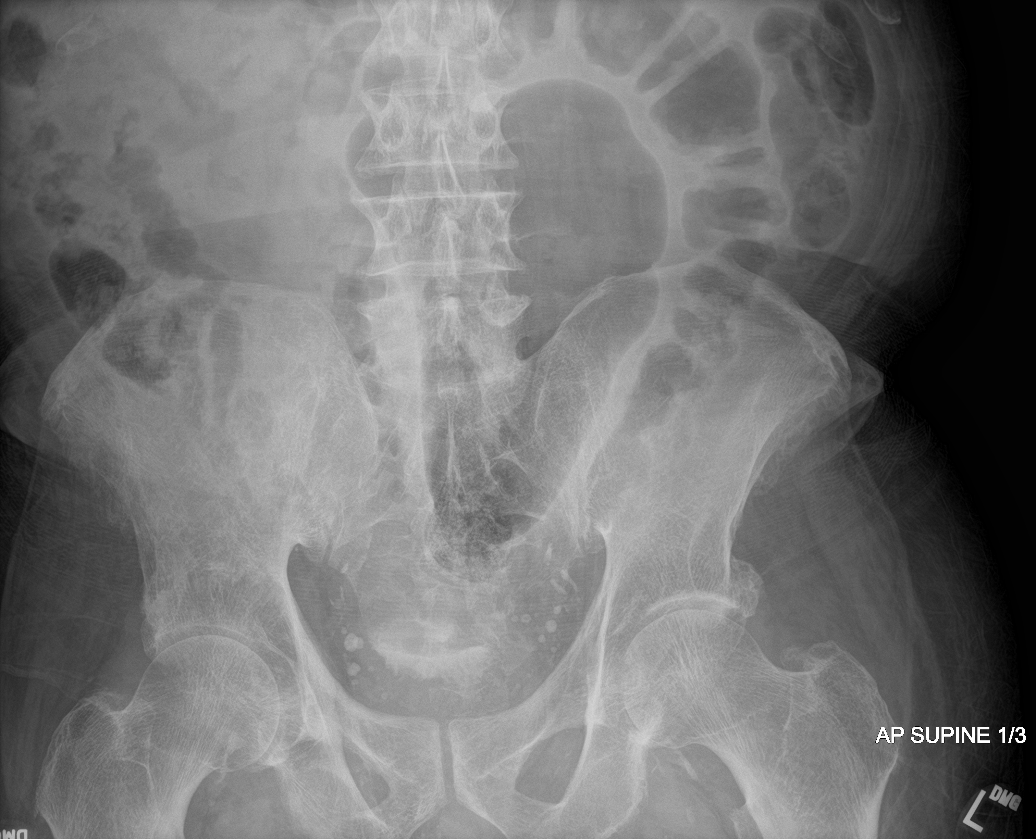

[abdomen supine (3 of 3)]
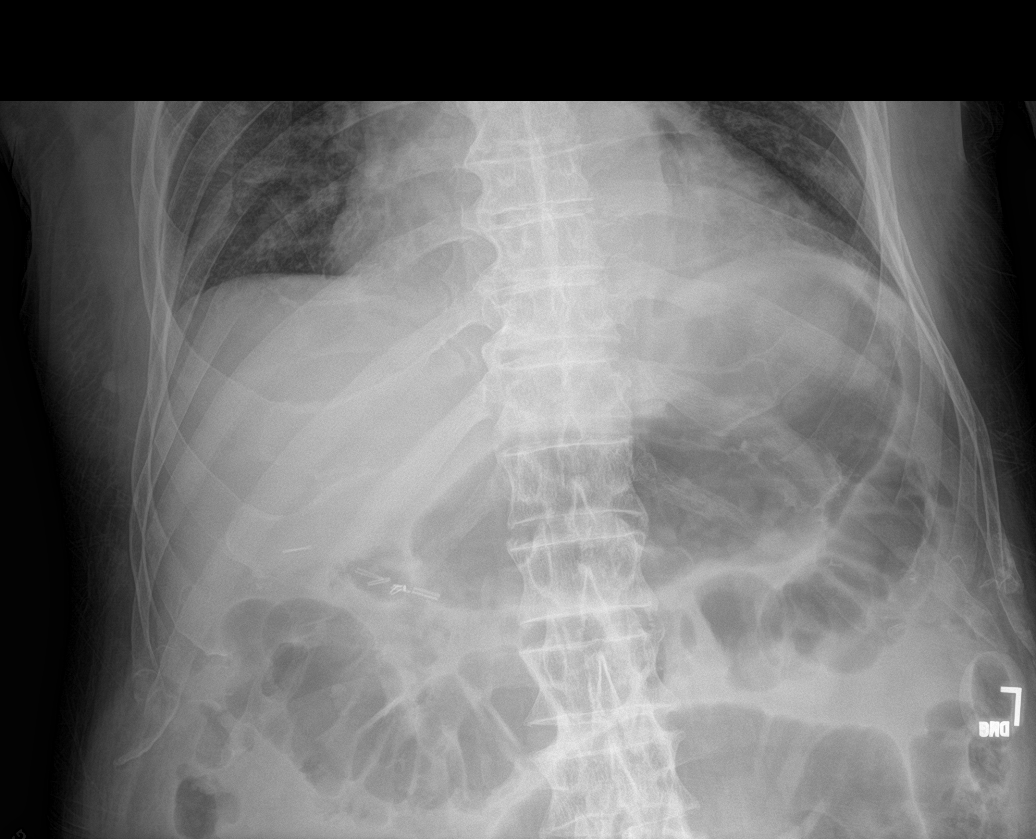

[5 of 5 positions shown; findings below may reference images not displayed]

FINDINGS: Supine and upright frontal views of the abdomen as well as an
upright frontal view of the chest are obtained. Cardiac silhouette
is stable. No airspace disease. Small bilateral effusions. No
pneumothorax.

No bowel obstruction or ileus. Continued gaseous distention of the
colon. Excreted contrast within the urinary bladder from previous
CT. No free gas in the greater peritoneal sac. No abdominal masses
or abnormal calcifications.
IMPRESSION: 1. No evidence of bowel obstruction or ileus.
2. Stable gaseous distension of the sigmoid colon.
3. Small bilateral pleural effusions.

## 2022-05-16 IMAGING — DX DG CHEST 1V PORT
1 series · 1 of 1 positions shown · non-contrast
Comparison: 02/20/2020

CLINICAL DATA: Weakness

EXAM:
PORTABLE CHEST 1 VIEW

[chest ap]
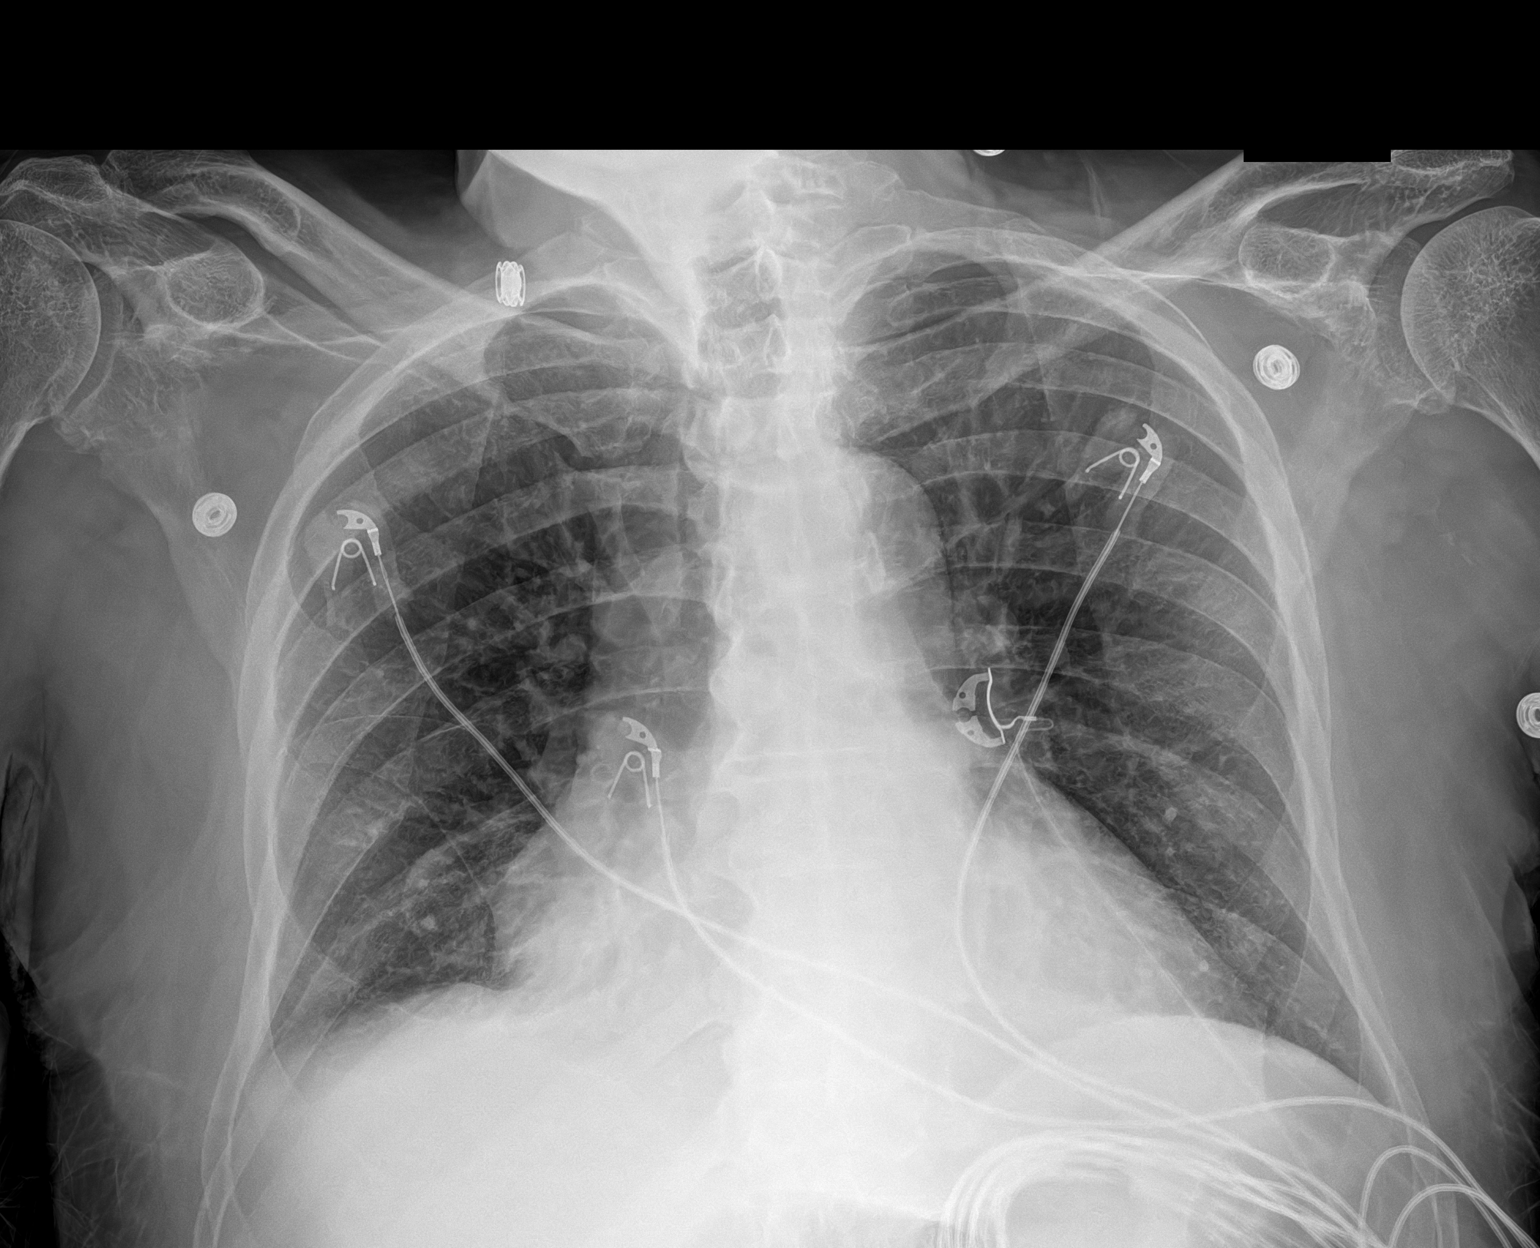

[1 of 1 positions shown; findings below may reference images not displayed]

FINDINGS: Cardiac shadow is enlarged but stable. Lungs are well aerated
bilaterally. No focal infiltrate or effusion is seen. No bony
abnormality is noted.
IMPRESSION: No active disease.
